# Patient Record
Sex: Male | Born: 1955 | Race: White | Hispanic: No | Marital: Married | Smoking: Never smoker
Health system: Southern US, Community
[De-identification: ages and names within clinical notes are randomized; demographics above are authoritative.]

## PROBLEM LIST (undated history)

## (undated) DIAGNOSIS — Z8701 Personal history of pneumonia (recurrent): Secondary | ICD-10-CM

## (undated) DIAGNOSIS — R931 Abnormal findings on diagnostic imaging of heart and coronary circulation: Secondary | ICD-10-CM

## (undated) DIAGNOSIS — J189 Pneumonia, unspecified organism: Secondary | ICD-10-CM

## (undated) DIAGNOSIS — E785 Hyperlipidemia, unspecified: Secondary | ICD-10-CM

## (undated) DIAGNOSIS — H9190 Unspecified hearing loss, unspecified ear: Secondary | ICD-10-CM

## (undated) DIAGNOSIS — Z9289 Personal history of other medical treatment: Secondary | ICD-10-CM

## (undated) DIAGNOSIS — R9439 Abnormal result of other cardiovascular function study: Secondary | ICD-10-CM

## (undated) DIAGNOSIS — I2510 Atherosclerotic heart disease of native coronary artery without angina pectoris: Principal | ICD-10-CM

## (undated) DIAGNOSIS — C801 Malignant (primary) neoplasm, unspecified: Secondary | ICD-10-CM

## (undated) DIAGNOSIS — I200 Unstable angina: Secondary | ICD-10-CM

## (undated) DIAGNOSIS — K219 Gastro-esophageal reflux disease without esophagitis: Secondary | ICD-10-CM

## (undated) DIAGNOSIS — I10 Essential (primary) hypertension: Secondary | ICD-10-CM

## (undated) DIAGNOSIS — C099 Malignant neoplasm of tonsil, unspecified: Secondary | ICD-10-CM

## (undated) DIAGNOSIS — M109 Gout, unspecified: Secondary | ICD-10-CM

## (undated) HISTORY — DX: Abnormal findings on diagnostic imaging of heart and coronary circulation: R93.1

## (undated) HISTORY — DX: Abnormal result of other cardiovascular function study: R94.39

## (undated) HISTORY — DX: Atherosclerotic heart disease of native coronary artery without angina pectoris: I25.10

## (undated) HISTORY — DX: Malignant (primary) neoplasm, unspecified: C80.1

## (undated) HISTORY — DX: Unstable angina: I20.0

## (undated) HISTORY — PX: WISDOM TOOTH EXTRACTION: SHX21

## (undated) HISTORY — DX: Personal history of other medical treatment: Z92.89

## (undated) HISTORY — PX: VASECTOMY: SHX75

## (undated) HISTORY — PX: CORONARY ARTERY BYPASS GRAFT: SHX141

## (undated) HISTORY — DX: Hyperlipidemia, unspecified: E78.5

## (undated) NOTE — *Deleted (*Deleted)
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  Pt with PMH also + for CAD s/p CABG x4 2017.  His treatment has been complicated by mucositis, oral candidiasis, hospital admission with n/v - dc'd 03/05/2020. He is s/p PEG and port placement 02/10/2020 and several dentition extraction with debridement 02/04/2020.  Pt has working with OP SLP and per notes, pt and daughter report pt has experienced coughing with intake x 4-6 weeks.  Reports more issues with swallowing foods than liquids.      Type of Study MBS-Modified Barium Swallow Study  Previous Swallow Assessment clinical eval with OP SlP  Diet Prior to this Study Dysphagia 3 (soft);Thin liquids  Respiratory Status Room air  History of Recent Intubation No  Behavior/Cognition Alert;Cooperative;Pleasant mood  Oral Cavity Assessment Edema;Erythema;Excessive secretions;Other (comment) (viscous secretions as well as suspected mucositis)  Oral Care Completed by SLP Yes (instructed pt to drink water to decrease secretion retention)  Oral Cavity - Dentition Adequate natural dentition;Other (Comment) (anterior dentition only)  Vision Functional for self feeding  Self-Feeding Abilities Able to feed self  Patient Positioning Upright in chair  Baseline Vocal Quality Hoarse;Low vocal intensity;Other (comment) (hypernasal)  Volitional Cough Strong  Volitional Swallow Able to elicit  Anatomy Other (Comment)  Pharyngeal Secretions Standing secretions in (comment) (oropharyngeal secretions mixed with barium)  Oral Motor/Sensory Function  Overall Oral Motor/Sensory Function Moderate impairment  Facial  Strength  (able to seal lips on straw and cup)  Lingual ROM Reduced right  Lingual Symmetry Abnormal symmetry right  Lingual Strength Reduced;Suspected CN XII (hypoglossal) dysfunction  Velum Impaired right (deviates left upon phonation)  Mandible Other (Comment) (decreased ROM noted - advise trismus device)  Oral Preparation/Oral Phase  Oral Phase Impaired  Oral - Nectar  Oral - Nectar Cup WFL;Premature spillage  Oral - Nectar Teaspoon WFL  Oral - Thin  Oral - Thin Straw WFL  Oral - Thin Cup WFL  Oral - Thin Teaspoon WFL  Oral - Solids  Oral - Pill WFL (pt swallowed whole without liquids initially)  Oral - Mech Soft Lingual/palatal residue (pt reports oral retention of cracker due dentition)  Oral - Puree WFL  Pharyngeal Phase  Pharyngeal Phase Impaired  Pharyngeal - Nectar  Pharyngeal- Nectar Cup Delayed swallow initiation-pyriform sinuses;Reduced epiglottic inversion;Reduced laryngeal elevation;Reduced airway/laryngeal closure;Reduced tongue base retraction;Penetration/Aspiration during swallow;Pharyngeal residue - valleculae;Pharyngeal residue - pyriform  Pharyngeal- Nectar Teaspoon Reduced epiglottic inversion;Reduced tongue base retraction;Pharyngeal residue - valleculae;Pharyngeal residue - pyriform  Pharyngeal Material enters airway, remains ABOVE vocal cords and not ejected out  Pharyngeal - Thin  Pharyngeal- Thin Straw Reduced epiglottic inversion;Reduced laryngeal elevation;Reduced airway/laryngeal closure;Reduced tongue base retraction;Penetration/Aspiration during swallow;Penetration/Apiration after swallow;Trace aspiration  Pharyngeal- Thin Cup Reduced epiglottic inversion;Reduced laryngeal elevation;Reduced airway/laryngeal closure;Reduced tongue base retraction;Penetration/Aspiration during swallow;Penetration/Apiration after swallow;Trace aspiration  Pharyngeal- Thin Teaspoon WFL  Pharyngeal Material enters airway, passes BELOW cords and not ejected out despite  cough attempt by patient  Pharyngeal Material enters airway, CONTACTS cords and then ejected out;Material enters airway, passes BELOW cords and not ejected out despite cough attempt by patient  Pharyngeal - Solids  Pharyngeal- Pill Reduced epiglottic inversion;Reduced laryngeal elevation;Reduced airway/laryngeal closure;Reduced  tongue base retraction;Pharyngeal residue - valleculae  Pharyngeal- Mechanical Soft Reduced laryngeal elevation;Reduced airway/laryngeal closure;Reduced tongue base retraction;Pharyngeal residue - valleculae  Pharyngeal- Puree WFL;Pharyngeal residue - valleculae  Pharyngeal Phase - Comment  Pharyngeal Comment head turn right did not decrease retention in pharynx, dry swallows and "hock" and expectorate effective to expel vallecular retention, consumption of liquids after solids did not consisently clear pharyngeal retention and pt did NOT sense barium tablet lodged at vallecular region  Cervical Esophageal Phase  Cervical Esophageal Phase [facility]  Clinical Impression  Clinical Impression Patient presents with minimal oral and moderately severe pharyngeal dysphagia due to his tonsillar cancer, surgical intervention, etc.  He primarily demonstrates decreased sustained laryngeal elevation and tongue base retraction resulting in vallecular retention with liquids, tablet and cracker and minimal penetration/aspiration of liquids.  Pt is mostly sensate to aspiration resulting in reflexive coughing *at times delayed- likely reflex not activated until bolus reached carina*.  Aspiration amount increased with sequential swallows of thin due to decreased sustained laryngeal closure.  In addition, oropharyngeal secretions mixed with barium - and trace aspiration of secretions noted.  Pudding bolus transited easily through oropharynx with only trace retention (approx. 90% cleared with initial swallow)- likely due to increased sensory input and thus muscular contraction.  Pt had difficulties  masticating cracker bolus due to only anterior dentition present and reported oral cracker retention throughout remainder of study.  Decreased propulsion of cracker bolus resulted in increased vallecular retention (approx. 50% of bolus cleared with initial swallow).  Pt was sensate to this and advised would consume liquids - however with liquid consumption - cracker bolus with barium did not transit into esophagus.  Head turn right with liquid did not decrease retention however dry swallows effective.  Pt also able to expectorate to clear vallecular retention during session and advises he conducts this at home.  Barium tablet swallowed without liquid and lodged at vallecular space without sensation and did not transit into esophagus with 2 thin liquid boluses.  Use of pudding and cue to "swallow fast and hard" picked up tablet and transited it into the esophagus.  Educated pt to results of testing and recommendations during MBS reviewing recommendation to drink water, follow solids with liquids, take medications with puree *start and follow with liquids for moisture*.  We also discussed pt's lack of sensation to barium tablet in vallecular region and thus indication to use caution - recommend pt expectorate at end of meal/po to assure pharynx is clear.  In addition, advise pt swish and expectorate after meals to assure oral clearance adequate.  Pt likely is having some low-grade chronic aspiration but with precautions including small single sips of liquids besides water, implementing compensations his nutrition and pna risk can be diminished significantly.  SLP had p  SLP Visit Diagnosis Dysphagia, oropharyngeal phase (R13.12)  Impact on safety and function Mild aspiration risk;Moderate aspiration risk;Risk for inadequate nutrition/hydration  Swallow Evaluation Recommendations  SLP Diet Recommendations Dysphagia 3 (Mech soft) solids;Thin liquid  Liquid Administration via Cup  Medication Administration Whole  meds with puree  Supervision Patient able to self feed  Compensations Slow rate;Small sips/bites;Multiple dry swallows after each bite/sip;Follow solids with liquid;Clear throat intermittently ("hock" and expectorate, start po with water)  Postural Changes Remain semi-upright after after feeds/meals (Comment);Seated upright at 90 degrees  Treatment Plan  Oral Care Recommendations Oral care before and after PO  Individuals Consulted  Consulted and Agree with Results and Recommendations Patient  SLP Time Calculation  SLP Start Time (ACUTE  ONLY) 0920  SLP Stop Time (ACUTE ONLY) 1004  SLP Time Calculation (min) (ACUTE ONLY) 44 min  SLP Evaluations  $ SLP Speech Visit 1 Visit  SLP Evaluations  $Outpatient MBS Swallow 1 Procedure  $Swallowing Treatment 1 Procedure  Rolena Infante, MS [facility] SLP Acute Rehab Services Office [phone]

---

## 2013-09-03 LAB — CBC AND DIFFERENTIAL
HCT: 44 % (ref 41–53)
Hemoglobin: 15 g/dL (ref 13.5–17.5)
Neutrophils Absolute: 3 /uL
Platelets: 158 10*3/uL (ref 150–399)
WBC: 5.1 10^3/mL

## 2013-09-03 LAB — BASIC METABOLIC PANEL
BUN: 10 mg/dL (ref 4–21)
Creatinine: 0.9 mg/dL (ref 0.6–1.3)
Glucose: 114 mg/dL
Potassium: 4.1 mmol/L (ref 3.4–5.3)
Sodium: 144 mmol/L (ref 137–147)

## 2013-09-03 LAB — HEPATIC FUNCTION PANEL
ALT: 38 U/L (ref 10–40)
AST: 31 U/L (ref 14–40)
Alkaline Phosphatase: 65 U/L (ref 25–125)
Bilirubin, Total: 0.8 mg/dL

## 2013-09-03 LAB — LIPID PANEL
Cholesterol: 235 mg/dL — AB (ref 0–200)
HDL: 41 mg/dL (ref 35–70)
LDL Cholesterol: 148 mg/dL
LDl/HDL Ratio: 5.7
Triglycerides: 231 mg/dL — AB (ref 40–160)

## 2013-09-03 LAB — TSH: TSH: 3.02 u[IU]/mL (ref 0.41–5.90)

## 2013-09-03 LAB — HEMOGLOBIN A1C: Hemoglobin A1C: 5.8

## 2015-06-08 ENCOUNTER — Other Ambulatory Visit: Payer: Self-pay | Admitting: Family Medicine

## 2015-08-04 ENCOUNTER — Other Ambulatory Visit: Payer: Self-pay | Admitting: Family Medicine

## 2015-08-24 ENCOUNTER — Other Ambulatory Visit: Payer: Self-pay | Admitting: Family Medicine

## 2015-08-27 ENCOUNTER — Encounter: Payer: Self-pay | Admitting: Family Medicine

## 2015-08-27 ENCOUNTER — Ambulatory Visit (INDEPENDENT_AMBULATORY_CARE_PROVIDER_SITE_OTHER): Payer: PRIVATE HEALTH INSURANCE | Admitting: Family Medicine

## 2015-08-27 VITALS — BP 118/74 | HR 87 | Temp 98.5°F | Resp 16 | Wt 219.0 lb

## 2015-08-27 DIAGNOSIS — Z8639 Personal history of other endocrine, nutritional and metabolic disease: Secondary | ICD-10-CM | POA: Diagnosis not present

## 2015-08-27 DIAGNOSIS — R0789 Other chest pain: Secondary | ICD-10-CM | POA: Diagnosis not present

## 2015-08-27 DIAGNOSIS — Z8739 Personal history of other diseases of the musculoskeletal system and connective tissue: Secondary | ICD-10-CM

## 2015-08-27 DIAGNOSIS — I10 Essential (primary) hypertension: Secondary | ICD-10-CM | POA: Diagnosis not present

## 2015-08-27 DIAGNOSIS — E785 Hyperlipidemia, unspecified: Secondary | ICD-10-CM

## 2015-08-27 DIAGNOSIS — J309 Allergic rhinitis, unspecified: Secondary | ICD-10-CM

## 2015-08-27 DIAGNOSIS — K219 Gastro-esophageal reflux disease without esophagitis: Secondary | ICD-10-CM | POA: Insufficient documentation

## 2015-08-27 DIAGNOSIS — Z87898 Personal history of other specified conditions: Secondary | ICD-10-CM | POA: Insufficient documentation

## 2015-08-27 NOTE — Progress Notes (Signed)
Patient ID: Omar Christian, male   DOB: Feb 23, 1956, 60 y.o.   MRN: [id number]       Patient: Omar Christian Male    DOB: 09/13/1955   60 y.o.   MRN: [id number] Visit Date: 08/27/2015  Today's Provider: Vernie Murders, PA   Chief Complaint  Patient presents with  . Chest Pain   Subjective:    HPI  Patient states that he has had issues with chest pain off and on for about 1 year. He notices it more with exertion and not at rest. He states he has been exercising for the past 3 weeks. This morning he went to exercise and started to feel "funny" so he went back home and took his b/p medication thinking it was related to him not taking it yet. When he gets chest pain it is usually located in the mid of his chest. This has worsened over a period of time.    No Known Allergies Previous Medications   ALLOPURINOL (ZYLOPRIM) 100 MG TABLET    TAKE 1 TABLET BY MOUTH EVERY DAY   FLUTICASONE (FLONASE) 50 MCG/ACT NASAL SPRAY    INSTILL 2 SPRAYS INTO EACH NOSTRIL AT BEDTIME   LISINOPRIL-HYDROCHLOROTHIAZIDE (PRINZIDE,ZESTORETIC) 10-12.5 MG TABLET    TAKE 1 TABLET BY MOUTH EVERY DAY   LORATADINE (CLARITIN) 10 MG TABLET    TAKE ONE TABLET BY MOUTH ONCE DAILY   PSYLLIUM (METAMUCIL PO)    Take by mouth.   RANITIDINE (ZANTAC) 150 MG CAPSULE    TAKE ONE CAPSULE BY MOUTH AT BEDTIME   RED YEAST RICE EXTRACT (RED YEAST RICE PO)    Take by mouth.    Review of Systems  Constitutional: Negative.   HENT: Positive for hearing loss and tinnitus.   Eyes: Negative.   Respiratory: Positive for chest tightness.   Cardiovascular: Positive for chest pain.  Gastrointestinal: Positive for abdominal distention.  Endocrine: Negative.   Musculoskeletal: Positive for arthralgias and gait problem.  Skin: Negative.   Allergic/Immunologic: Negative.   Neurological: Negative.   Hematological: Negative.   Psychiatric/Behavioral: Negative.     Social History  Substance Use Topics  . Smoking status: Never Smoker   .  Smokeless tobacco: Never Used  . Alcohol Use: No   Objective:   BP 118/74 mmHg  Pulse 87  Temp(Src) 98.5 F (36.9 C)  Resp 16  Wt 219 lb (99.338 kg)  SpO2 97%  Physical Exam  Constitutional: He appears well-developed and well-nourished.  HENT:  Head: Normocephalic.  Right Ear: External ear normal.  Left Ear: External ear normal.  Nose: Nose normal.  Mouth/Throat: Oropharynx is clear and moist.  Eyes: Conjunctivae and EOM are normal.  Neck: Normal range of motion. Neck supple. No JVD present.  Cardiovascular: Normal rate, regular rhythm, normal heart sounds and intact distal pulses.   Pulmonary/Chest: Effort normal and breath sounds normal.  Abdominal: Soft. Bowel sounds are normal.  Musculoskeletal: Normal range of motion.  Neurological: He is alert. He has normal reflexes.  Skin: No rash noted.  Psychiatric: He has a normal mood and affect.  Slightly anxious.      Assessment & Plan:     1. Other chest pain Having chest pain with exercise occasionally. Stops with rest and can go back to the workout. Denies dyspnea or diaphoresis (uncertain he fully understands questions. Mother died at age 24 from MI. EKG today showed sinus rhythm and slight diffuse ST changes. Very anxious about discomfort and heart issues similar to mother. Will get Troponin  level and schedule cardiology referral (may need stress test). - EKG 12-Lead - Ambulatory referral to Cardiology - Troponin I  2. Benign hypertension Very good BP reading today. Tolerating Prinzide without side effects. Continue present dosage and check labs. - CBC with Differential/Platelet  3. H/O: gout No recent flares. Tolerating Allopurinol 100 mg qd and trying to follow purine restricted diet. Recheck uric acid level. - Uric acid  4. HLD (hyperlipidemia) Continues to take Red Yeast Rice and Metamucil to try to lower cholesterol levels since the fall of 2016. Will recheck labs and continue low fat diet. - Psyllium  (METAMUCIL PO); Take by mouth. - Red Yeast Rice Extract (RED YEAST RICE PO); Take by mouth. - TSH - Lipid panel - Comprehensive metabolic panel  5. Allergic rhinitis, unspecified allergic rhinitis type Good control with use of Loratadine and Flonase.       Vernie Murders, PA  [facility]

## 2015-08-28 LAB — LIPID PANEL
Chol/HDL Ratio: 4.6 ratio units (ref 0.0–5.0)
Cholesterol, Total: 206 mg/dL — ABNORMAL HIGH (ref 100–199)
HDL: 45 mg/dL (ref >39)
LDL Calculated: 134 mg/dL — ABNORMAL HIGH (ref 0–99)
Triglycerides: 134 mg/dL (ref 0–149)
VLDL Cholesterol Cal: 27 mg/dL (ref 5–40)

## 2015-08-28 LAB — CBC WITH DIFFERENTIAL/PLATELET
Basophils Absolute: 0 10*3/uL (ref 0.0–0.2)
Basos: 0 %
EOS (ABSOLUTE): 0 10*3/uL (ref 0.0–0.4)
Eos: 1 %
Hematocrit: 41.7 % (ref 37.5–51.0)
Hemoglobin: 14.2 g/dL (ref 12.6–17.7)
Immature Grans (Abs): 0 10*3/uL (ref 0.0–0.1)
Immature Granulocytes: 0 %
Lymphocytes Absolute: 1.5 10*3/uL (ref 0.7–3.1)
Lymphs: 27 %
MCH: 30.1 pg (ref 26.6–33.0)
MCHC: 34.1 g/dL (ref 31.5–35.7)
MCV: 89 fL (ref 79–97)
Monocytes Absolute: 0.3 10*3/uL (ref 0.1–0.9)
Monocytes: 6 %
Neutrophils Absolute: 3.7 10*3/uL (ref 1.4–7.0)
Neutrophils: 66 %
Platelets: 155 10*3/uL (ref 150–379)
RBC: 4.71 x10E6/uL (ref 4.14–5.80)
RDW: 13 % (ref 12.3–15.4)
WBC: 5.6 10*3/uL (ref 3.4–10.8)

## 2015-08-28 LAB — COMPREHENSIVE METABOLIC PANEL
ALT: 36 IU/L (ref 0–44)
AST: 28 IU/L (ref 0–40)
Albumin/Globulin Ratio: 1.9 (ref 1.1–2.5)
Albumin: 4.5 g/dL (ref 3.5–5.5)
Alkaline Phosphatase: 64 IU/L (ref 39–117)
BUN/Creatinine Ratio: 9 (ref 9–20)
BUN: 8 mg/dL (ref 6–24)
Bilirubin Total: 0.8 mg/dL (ref 0.0–1.2)
CO2: 26 mmol/L (ref 18–29)
Calcium: 9.8 mg/dL (ref 8.7–10.2)
Chloride: 99 mmol/L (ref 96–106)
Creatinine, Ser: 0.85 mg/dL (ref 0.76–1.27)
GFR calc Af Amer: 111 mL/min/{1.73_m2} (ref >59)
GFR calc non Af Amer: 96 mL/min/{1.73_m2} (ref >59)
Globulin, Total: 2.4 g/dL (ref 1.5–4.5)
Glucose: 106 mg/dL — ABNORMAL HIGH (ref 65–99)
Potassium: 4 mmol/L (ref 3.5–5.2)
Sodium: 140 mmol/L (ref 134–144)
Total Protein: 6.9 g/dL (ref 6.0–8.5)

## 2015-08-28 LAB — TROPONIN I: Troponin I: 0.03 ng/mL (ref 0.00–0.04)

## 2015-08-28 LAB — TSH: TSH: 0.973 u[IU]/mL (ref 0.450–4.500)

## 2015-08-28 LAB — URIC ACID: Uric Acid: 7.3 mg/dL (ref 3.7–8.6)

## 2015-08-30 ENCOUNTER — Telehealth: Payer: Self-pay

## 2015-08-30 NOTE — Telephone Encounter (Signed)
LMTCB

## 2015-08-30 NOTE — Telephone Encounter (Signed)
-----   Message from [place], [place] sent at 08/30/2015 10:48 AM EST ----- Cholesterol better than 2 years ago and troponin test negative for heart attack. Uric acid level above 6.0 and should increase Allopurinol to 100 mg 2 daily. Continue present diet control and other medications at their present dosage. Proceed with referral to cardiologist for possible stress test. Recheck cholesterol blood tests in 6 months.

## 2015-08-31 ENCOUNTER — Telehealth: Payer: Self-pay | Admitting: Family Medicine

## 2015-08-31 NOTE — Telephone Encounter (Signed)
[place] wanted to let Simona Huh know that pt didn't show to his appt that we referred pt to yesterday 08/30/15. Thanks TNP

## 2015-08-31 NOTE — Telephone Encounter (Signed)
Please review. Thanks!  

## 2015-08-31 NOTE — Telephone Encounter (Signed)
Patient states he was unaware that he had a appointment with [address]. Judson Roch will reschedule patient's appointment and advise patient of new appointment date and time.

## 2015-08-31 NOTE — Telephone Encounter (Signed)
Advised patient of results.  

## 2015-09-03 DIAGNOSIS — R079 Chest pain, unspecified: Secondary | ICD-10-CM | POA: Insufficient documentation

## 2015-09-24 DIAGNOSIS — Z9289 Personal history of other medical treatment: Secondary | ICD-10-CM

## 2015-09-24 DIAGNOSIS — R9439 Abnormal result of other cardiovascular function study: Secondary | ICD-10-CM

## 2015-09-24 HISTORY — DX: Abnormal result of other cardiovascular function study: R94.39

## 2015-09-24 HISTORY — DX: Personal history of other medical treatment: Z92.89

## 2015-09-30 ENCOUNTER — Encounter: Payer: Self-pay | Admitting: *Deleted

## 2015-09-30 ENCOUNTER — Encounter
Admission: RE | Disposition: A | Discharge status: Home / Self Care | Payer: Self-pay | Source: Ambulatory Visit | Attending: Cardiology

## 2015-09-30 ENCOUNTER — Ambulatory Visit
Admission: RE | Admit: 2015-09-30 | Discharge: 2015-09-30 | Disposition: A | Discharge status: Home / Self Care | Payer: PRIVATE HEALTH INSURANCE | Source: Ambulatory Visit | Attending: Cardiology | Admitting: Cardiology

## 2015-09-30 DIAGNOSIS — E7800 Pure hypercholesterolemia, unspecified: Secondary | ICD-10-CM | POA: Diagnosis not present

## 2015-09-30 DIAGNOSIS — I10 Essential (primary) hypertension: Secondary | ICD-10-CM | POA: Insufficient documentation

## 2015-09-30 DIAGNOSIS — I2510 Atherosclerotic heart disease of native coronary artery without angina pectoris: Secondary | ICD-10-CM

## 2015-09-30 DIAGNOSIS — R079 Chest pain, unspecified: Secondary | ICD-10-CM | POA: Diagnosis present

## 2015-09-30 DIAGNOSIS — Z79899 Other long term (current) drug therapy: Secondary | ICD-10-CM | POA: Insufficient documentation

## 2015-09-30 DIAGNOSIS — Z8249 Family history of ischemic heart disease and other diseases of the circulatory system: Secondary | ICD-10-CM | POA: Insufficient documentation

## 2015-09-30 DIAGNOSIS — R931 Abnormal findings on diagnostic imaging of heart and coronary circulation: Secondary | ICD-10-CM

## 2015-09-30 DIAGNOSIS — E785 Hyperlipidemia, unspecified: Secondary | ICD-10-CM | POA: Insufficient documentation

## 2015-09-30 HISTORY — DX: Atherosclerotic heart disease of native coronary artery without angina pectoris: I25.10

## 2015-09-30 HISTORY — DX: Abnormal findings on diagnostic imaging of heart and coronary circulation: R93.1

## 2015-09-30 HISTORY — DX: Gastro-esophageal reflux disease without esophagitis: K21.9

## 2015-09-30 HISTORY — DX: Essential (primary) hypertension: I10

## 2015-09-30 HISTORY — PX: CARDIAC CATHETERIZATION: SHX172

## 2015-09-30 SURGERY — LEFT HEART CATH AND CORONARY ANGIOGRAPHY
Anesthesia: Moderate Sedation

## 2015-09-30 MED ORDER — SODIUM CHLORIDE 0.9 % IV SOLN: 250.0000 mL | INTRAVENOUS | Status: DC | PRN

## 2015-09-30 MED ORDER — HEPARIN (PORCINE) IN NACL 2-0.9 UNIT/ML-% IJ SOLN
INTRAMUSCULAR | Status: AC
  Filled 2015-09-30: qty 1000

## 2015-09-30 MED ORDER — FENTANYL CITRATE (PF) 100 MCG/2ML IJ SOLN
INTRAMUSCULAR | Status: DC | PRN
  Administered 2015-09-30: 25 ug via INTRAVENOUS

## 2015-09-30 MED ORDER — SODIUM CHLORIDE 0.9% FLUSH
3.0000 mL | INTRAVENOUS | Status: DC | PRN
  Administered 2015-09-30: 10 mL via INTRAVENOUS
  Filled 2015-09-30: qty 3

## 2015-09-30 MED ORDER — MIDAZOLAM HCL 2 MG/2ML IJ SOLN
INTRAMUSCULAR | Status: DC | PRN
Start: 2015-09-30 — End: 2015-09-30
  Administered 2015-09-30: 1 mg via INTRAVENOUS

## 2015-09-30 MED ORDER — SODIUM CHLORIDE 0.9 % WEIGHT BASED INFUSION: 1.0000 mL/kg/h | INTRAVENOUS | Status: DC

## 2015-09-30 MED ORDER — IOPAMIDOL (ISOVUE-300) INJECTION 61%
INTRAVENOUS | Status: DC | PRN
  Administered 2015-09-30: 150 mL via INTRA_ARTERIAL

## 2015-09-30 MED ORDER — CLOPIDOGREL BISULFATE 75 MG PO TABS
ORAL_TABLET | ORAL | Status: AC
  Filled 2015-09-30: qty 8

## 2015-09-30 MED ORDER — FENTANYL CITRATE (PF) 100 MCG/2ML IJ SOLN
INTRAMUSCULAR | Status: AC
  Filled 2015-09-30: qty 2

## 2015-09-30 MED ORDER — BIVALIRUDIN 250 MG IV SOLR
INTRAVENOUS | Status: AC
  Filled 2015-09-30: qty 250

## 2015-09-30 MED ORDER — SODIUM CHLORIDE 0.9 % WEIGHT BASED INFUSION
3.0000 mL/kg/h | INTRAVENOUS | Status: DC
  Administered 2015-09-30: 3 mL/kg/h via INTRAVENOUS

## 2015-09-30 MED ORDER — ASPIRIN 81 MG PO CHEW: 81.0000 mg | CHEWABLE_TABLET | ORAL | Status: DC

## 2015-09-30 MED ORDER — SODIUM CHLORIDE 0.9% FLUSH: 3.0000 mL | Freq: Two times a day (BID) | INTRAVENOUS | Status: DC

## 2015-09-30 MED ORDER — NITROGLYCERIN 5 MG/ML IV SOLN
INTRAVENOUS | Status: AC
  Filled 2015-09-30: qty 10

## 2015-09-30 MED ORDER — MIDAZOLAM HCL 2 MG/2ML IJ SOLN
INTRAMUSCULAR | Status: AC
  Filled 2015-09-30: qty 2

## 2015-09-30 SURGICAL SUPPLY — 11 items
CATH INFINITI 5 FR 3DRC (CATHETERS) ×2 IMPLANT
CATH INFINITI 5FR ANG PIGTAIL (CATHETERS) ×2 IMPLANT
CATH INFINITI 5FR JL4 (CATHETERS) ×2 IMPLANT
CATH INFINITI 5FR JL5 (CATHETERS) ×2 IMPLANT
CATH INFINITI JR4 5F (CATHETERS) ×2 IMPLANT
DEVICE CLOSURE MYNXGRIP 5F (Vascular Products) ×2 IMPLANT
KIT MANI 3VAL PERCEP (MISCELLANEOUS) ×2 IMPLANT
NEEDLE PERC 18GX7CM (NEEDLE) ×2 IMPLANT
PACK CARDIAC CATH (CUSTOM PROCEDURE TRAY) ×2 IMPLANT
SHEATH AVANTI 5FR X 11CM (SHEATH) ×2 IMPLANT
WIRE EMERALD 3MM-J .035X150CM (WIRE) ×2 IMPLANT

## 2015-09-30 NOTE — Discharge Instructions (Signed)

## 2015-10-01 ENCOUNTER — Institutional Professional Consult (permissible substitution) (INDEPENDENT_AMBULATORY_CARE_PROVIDER_SITE_OTHER): Payer: PRIVATE HEALTH INSURANCE | Admitting: Cardiothoracic Surgery

## 2015-10-01 ENCOUNTER — Other Ambulatory Visit: Payer: Self-pay | Admitting: *Deleted

## 2015-10-01 ENCOUNTER — Encounter: Payer: Self-pay | Admitting: *Deleted

## 2015-10-01 ENCOUNTER — Encounter: Payer: Self-pay | Admitting: Cardiothoracic Surgery

## 2015-10-01 VITALS — BP 125/76 | HR 88 | Resp 16 | Ht 73.0 in | Wt 216.0 lb

## 2015-10-01 DIAGNOSIS — I2510 Atherosclerotic heart disease of native coronary artery without angina pectoris: Secondary | ICD-10-CM | POA: Diagnosis not present

## 2015-10-01 DIAGNOSIS — I200 Unstable angina: Secondary | ICD-10-CM | POA: Diagnosis not present

## 2015-10-01 DIAGNOSIS — E7800 Pure hypercholesterolemia, unspecified: Secondary | ICD-10-CM

## 2015-10-01 MED ORDER — ROSUVASTATIN CALCIUM 5 MG PO TABS: 10.0000 mg | ORAL_TABLET | Freq: Every day | ORAL | Status: DC

## 2015-10-01 MED ORDER — METOPROLOL TARTRATE 25 MG PO TABS: 12.5000 mg | ORAL_TABLET | Freq: Two times a day (BID) | ORAL | Status: DC

## 2015-10-01 MED ORDER — ASPIRIN EC 81 MG PO TBEC: 81.0000 mg | DELAYED_RELEASE_TABLET | Freq: Every day | ORAL | Status: DC

## 2015-10-01 MED ORDER — NITROGLYCERIN 0.4 MG SL SUBL: 0.4000 mg | SUBLINGUAL_TABLET | SUBLINGUAL | Status: DC | PRN

## 2015-10-01 MED ORDER — ATORVASTATIN CALCIUM 20 MG PO TABS: 20.0000 mg | ORAL_TABLET | ORAL | Status: DC

## 2015-10-01 NOTE — Patient Instructions (Signed)
Stop lisinopril Wednesday April 5    Angina Pectoris Angina pectoris, often called angina, is extreme discomfort in the chest, neck, or arm. This is caused by a lack of blood in the middle and thickest layer of the heart wall (myocardium). There are four types of angina:  Stable angina. Stable angina usually occurs in episodes of predictable frequency and duration. It is usually brought on by physical activity, stress, or excitement. Stable angina usually lasts a few minutes and can often be relieved by a medicine that you place under your tongue. This medicine is called sublingual nitroglycerin.  Unstable angina. Unstable angina can occur even when you are doing little or no physical activity. It can even occur while you are sleeping or when you are at rest. It can suddenly increase in severity or frequency. It may not be relieved by sublingual nitroglycerin, and it can last up to 30 minutes.  Microvascular angina. This type of angina is caused by a disorder of tiny blood vessels called arterioles. Microvascular angina is more common in women. The pain may be more severe and last longer than other types of angina pectoris.  Prinzmetal or variant angina. This type of angina pectoris is rare and usually occurs when you are doing little or no physical activity. It especially occurs in the early morning hours. CAUSES Atherosclerosis is the cause of angina. This is the buildup of fat and cholesterol (plaque) on the inside of the arteries. Over time, the plaque may narrow or block the artery, and this will lessen blood flow to the heart. Plaque can also become weak and break off within a coronary artery to form a clot and cause a sudden blockage. RISK FACTORS Risk factors common to both men and women include:  High cholesterol levels.  High blood pressure (hypertension).  Tobacco use.  Diabetes.  Family history of angina.  Obesity.  Lack of exercise.  A diet high in saturated fats. Women  are at greater risk for angina if they are:  Over age 53.  Postmenopausal. SYMPTOMS Many people do not experience any symptoms during the early stages of angina. As the condition progresses, symptoms common to both men and women may include:  Chest pain.  The pain can be described as a crushing or squeezing in the chest, or a tightness, pressure, fullness, or heaviness in the chest.  The pain can last more than a few minutes, or it can stop and recur.  Pain in the arms, neck, jaw, or back.  Unexplained heartburn or indigestion.  Shortness of breath.  Nausea.  Sudden cold sweats.  Sudden light-headedness. Many women have chest discomfort and some of the other symptoms. However, women often have different (atypical) symptoms, such as:   Fatigue.  Unexplained feelings of nervousness or anxiety.  Unexplained weakness.  Dizziness or fainting. Sometimes, women may have angina without any symptoms. DIAGNOSIS  Tests to diagnose angina may include:  ECG (electrocardiogram).  Exercise stress test. This looks for signs of blockage when the heart is being exercised.  Pharmacologic stress test. This test looks for signs of blockage when the heart is being stressed with a medicine.  Blood tests.  Coronary angiogram. This is a procedure to look at the coronary arteries to see if there is any blockage. TREATMENT  The treatment of angina may include the following:  Healthy behavioral changes to reduce or control risk factors.  Medicine.  Coronary stenting.A stent helps to keep an artery open.  Coronary angioplasty. This procedure widens  a narrowed or blocked artery.  Coronary arterybypass surgery. This will allow your blood to pass the blockage (bypass) to reach your heart. HOME CARE INSTRUCTIONS   Take medicines only as directed by your health care provider.  Do not take the following medicines unless your health care provider approves:  Nonsteroidal  anti-inflammatory drugs (NSAIDs), such as ibuprofen, naproxen, or celecoxib.  Vitamin supplements that contain vitamin A, vitamin E, or both.  Hormone replacement therapy that contains estrogen with or without progestin.  Manage other health conditions such as hypertension and diabetes as directed by your health care provider.  Follow a heart-healthy diet. A dietitian can help to educate you about healthy food options and changes.  Use healthy cooking methods such as roasting, grilling, broiling, baking, poaching, steaming, or stir-frying. Talk to a dietitian to learn more about healthy cooking methods.  Follow an exercise program approved by your health care provider.  Maintain a healthy weight. Lose weight as approved by your health care provider.  Plan rest periods when fatigued.  Learn to manage stress.  Do not use any tobacco products, including cigarettes, chewing tobacco, or electronic cigarettes. If you need help quitting, ask your health care provider.  If you drink alcohol, and your health care provider approves, limit your alcohol intake to no more than 1 drink per day. One drink equals 12 ounces of beer, 5 ounces of wine, or 1 ounces of hard liquor.  Stop illegal drug use.  Keep all follow-up visits as directed by your health care provider. This is important. SEEK IMMEDIATE MEDICAL CARE IF:   You have pain in your chest, neck, arm, jaw, stomach, or back that lasts more than a few minutes, is recurring, or is unrelieved by taking sublingualnitroglycerin.  You have profuse sweating without cause.  You have unexplained:  Heartburn or indigestion.  Shortness of breath or difficulty breathing.  Nausea or vomiting.  Fatigue.  Feelings of nervousness or anxiety.  Weakness.  Diarrhea.  You have sudden light-headedness or dizziness.  You faint. These symptoms may represent a serious problem that is an emergency. Do not wait to see if the symptoms will go away.  Get medical help right away. Call your local emergency services (911 in the [place] Do not drive yourself to the hospital.   This information is not intended to replace advice given to you by your health care provider. Make sure you discuss any questions you have with your health care provider.   Document Released: 06/19/2005 Document Revised: 07/10/2014 Document Reviewed: 10/21/2013 Elsevier Interactive Patient Education 2016 [employer] Coronary Artery Bypass Grafting Coronary artery bypass grafting (CABG) is a procedure done to bypass or fix arteries of the heart (coronary arteries) that have become narrow or blocked. This narrowing is usually the result of plaque that has built up in the walls of the vessels. The coronary arteries supply the heart with the oxygen and nutrients it needs to pump blood to your body. In the CABG procedure, a section of blood vessel from another part of the body (usually the leg, arm, or chest wall) is removed and then inserted where it will allow blood to bypass the damaged part of the coronary artery.  LET [facility] CARE PROVIDER KNOW ABOUT:  Any allergies you have.   All medicines you are taking, including blood thinners, vitamins, herbs, eye drops, creams, and over-the-counter medicines.   Use of steroids (by mouth or creams).   Previous problems you or members of your family have had  with the use of anesthetics.   Any blood disorders you have.   Previous surgeries you have had.   Medical conditions you have.  RISKS AND COMPLICATIONS Generally, this is a safe procedure. However, problems can occur and include:   Blood loss.   Stroke.   Infection.   Pain at the surgical site.   Heart attack during or after surgery.   Kidney failure.  BEFORE THE PROCEDURE  Take medicines only as directed by your health care provider. You may be asked to start new medicines and stop taking others. Do not stop medicines or adjust dosages on your  own.  Do not eat or drink anything after midnight the night before the procedure or as directed by your health care provider. Ask your health care provider if it is okay to take a sip of water with any needed medicines. PROCEDURE The surgeon may use either an open technique or a minimally invasive technique for this surgery. Traditional open surgery  You will be given medicine to make you sleep through the procedure (general anesthetic).  Once you are asleep, a cut (incision) will be made down the front of the chest through the breastbone (sternum). The sternum will be spread open so your heart can be seen.  You will then be placed on a heart-lung bypass machine. This machine will provide oxygen to your blood while the heart is undergoing surgery.  Your heart will then be temporarily stopped so that the surgeon can do the next steps.  A section of vein will likely be taken from your leg and used to bypass the blocked arteries of your heart. Sometimes parts of an artery from inside your chest wall or from your arm will be used, either alone or in combination with leg veins.  When the bypasses are done, you will be taken off the machine.  Your heart will be restarted and will take over again normally.  The sac around the heart will be closed.  Your chest will then be closed with stitches or staples.  Tubes will remain in your chest and will be connected to a suction device in order to help drain fluid and reinflate the lungs. Minimally invasive surgery This technique is done from an incision over your left chest area. If appropriate, your surgeon may not have to slow or stop your heart. If your condition allows for this procedure, there will often be less blood loss, less pain, a shorter hospital stay, and faster recovery compared to traditional open surgery. AFTER THE PROCEDURE  You will be taken to a recovery area to be monitored.  You may wake up with a tube in your throat to help  your breathing. You may be connected to a breathing machine. You will not be able to talk while the tube is in place. The tube will be taken out as soon as it is safe to do so.  You will be groggy and may have some pain. You will be given pain medicine to help control the pain.   This information is not intended to replace advice given to you by your health care provider. Make sure you discuss any questions you have with your health care provider.   Document Released: 03/29/2005 Document Revised: 07/10/2014 Document Reviewed: 11/26/2012 Elsevier Interactive Patient Education 2016 [employer]  Coronary Artery Bypass Grafting, Care After Refer to this sheet in the next few weeks. These instructions provide you with information on caring for yourself after your procedure. Your health  care provider may also give you more specific instructions. Your treatment has been planned according to current medical practices, but problems sometimes occur. Call your health care provider if you have any problems or questions after your procedure. WHAT TO EXPECT AFTER THE PROCEDURE Recovery from surgery will be different for everyone. Some people feel well after 3 or 4 weeks, while for others it takes longer. After your procedure, it is typical to have the following:  Nausea and a lack of appetite.   Constipation.  Weakness and fatigue.   Depression or irritability.   Pain or discomfort at your incision site. HOME CARE INSTRUCTIONS  Take medicines only as directed by your health care provider. Do not stop taking medicines or start any new medicines without first checking with your health care provider.  Take your pulse as directed by your health care provider.  Perform deep breathing as directed by your health care provider. If you were given a device called an incentive spirometer, use it to practice deep breathing several times a day. Support your chest with a pillow or your arms when you take deep  breaths or cough.  Keep incision areas clean, dry, and protected. Remove or change any bandages (dressings) only as directed by your health care provider. You may have skin adhesive strips over the incision areas. Do not take the strips off. They will fall off on their own.  Check incision areas daily for any swelling, redness, or drainage.  If incisions were made in your legs, do the following:  Avoid crossing your legs.   Avoid sitting for long periods of time. Change positions every 30 minutes.   Elevate your legs when you are sitting.  Wear compression stockings as directed by your health care provider. These stockings help keep blood clots from forming in your legs.  Take showers once your health care provider approves. Until then, only take sponge baths. Pat incisions dry. Do not rub incisions with a washcloth or towel. Do not take baths, swim, or use a hot tub until your health care provider approves.  Eat foods that are high in fiber, such as raw fruits and vegetables, whole grains, beans, and nuts. Meats should be lean cut. Avoid canned, processed, and fried foods.  Drink enough fluid to keep your urine clear or pale yellow.  Weigh yourself every day. This helps identify if you are retaining fluid that may make your heart and lungs work harder.  Rest and limit activity as directed by your health care provider. You may be instructed to:  Stop any activity at once if you have chest pain, shortness of breath, irregular heartbeats, or dizziness. Get help right away if you have any of these symptoms.  Move around frequently for short periods or take short walks as directed by your health care provider. Increase your activities gradually. You may need physical therapy or cardiac rehabilitation to help strengthen your muscles and build your endurance.  Avoid lifting, pushing, or pulling anything heavier than 10 lb (4.5 kg) for at least 6 weeks after surgery.  Do not drive until  your health care provider approves.  Ask your health care provider when you may return to work.  Ask your health care provider when you may resume sexual activity.  Keep all follow-up visits as directed by your health care provider. This is important. SEEK MEDICAL CARE IF:  You have swelling, redness, increasing pain, or drainage at the site of an incision.  You have a fever.  You have swelling in your ankles or legs.  You have pain in your legs.   You gain 2 or more pounds (0.9 kg) a day.  You are nauseous or vomit.  You have diarrhea. SEEK IMMEDIATE MEDICAL CARE IF:  You have chest pain that goes to your jaw or arms.  You have shortness of breath.   You have a fast or irregular heartbeat.   You notice a "clicking" in your breastbone (sternum) when you move.   You have numbness or weakness in your arms or legs.  You feel dizzy or light-headed.  MAKE SURE YOU:  Understand these instructions.  Will watch your condition.  Will get help right away if you are not doing well or get worse.   This information is not intended to replace advice given to you by your health care provider. Make sure you discuss any questions you have with your health care provider.   Document Released: 01/06/2005 Document Revised: 07/10/2014 Document Reviewed: 11/26/2012 Elsevier Interactive Patient Education [employer].

## 2015-10-01 NOTE — Progress Notes (Signed)
[address]       [place] [address]             [phone]                    Takeru Garrod Maeser Medical Record [id number] Date of Birth: 09/25/1955  Referring: [name], MD Primary Care: Vernie Murders, PA  Chief Complaint:    Chief Complaint  Patient presents with  . Coronary Artery Disease    eval for surgery.Marland KitchenMarland KitchenSTRESS TEST/ECHO 09/24/15 @ [place], CATH 09/30/15 @ [place]     History of Present Illness:    Omar Christian 60 y.o. male is seen in the office  today As an elective referral from Dr. Saralyn Pilar for coronary artery disease. Patient has had at least 6 months if not longer history of chest discomfort, indigestion sometimes with exertion and sometimes at rest. He notes that walking uphill or upstairs makes the symptoms worse and take several minutes of rest before they resolve. The patient has had no previous cardiac history. A nuclear stress test was performed in [place] on March 27 with EKG evidence of ischemia with exertion ejection fraction 44% apical wall hypokinesis moderate inferior and apical wall ischemia. Echocardiogram was performed at [facility] clinic on March 24 demonstrating dilated aortic root dilated ascending aorta to 4.5 cm ejection fraction 55% mild mitral and aortic insufficiency. Cardiac catheterization was performed at [facility] on 09/30/2015. The right coronary artery was not identified, but appears to have a left dominant system so likely the right coronary is small,  patient has severe disease in the left system including proximal LAD first obtuse marginal and circumflex, there is evidence of a ascending aortic dilatation, but this is not fully evaluated.  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 Not known to be diabetic   Current Activity/ Functional Status:  Patient is independent with mobility/ambulation, transfers, ADL's, IADL's.   Zubrod  Score: At the time of surgery this patient's most appropriate activity status/level should be described as: [x]     0    Normal activity, no symptoms []     1    Restricted in physical strenuous activity but ambulatory, able to do out light work []     2    Ambulatory and capable of self care, unable to do work activities, up and about               >50 % of waking hours                              []     3    Only limited self care, in bed greater than 50% of waking hours []     4    Completely disabled, no self care, confined to bed or chair []     5    Moribund   Past Medical History  Diagnosis Date  . Hypertension   . GERD (gastroesophageal reflux disease)   . Coronary artery disease   . Hyperlipidemia   . H/O echocardiogram 09/24/15    [place]  . Abnormal myocardial perfusion study 09/24/15    [facility]  . Abnormal findings on cardiac catheterization 09/30/15    [facility]  . Progressive angina ([place])   . CAD, multiple vessel 09/30/15    per CATH @ [facility]/DR.PARACHOS    No past surgical  history on file.  Family History  Problem Relation Age of Onset  . Heart disease Mother   . Alcohol abuse Father   . Throat cancer Father   . Muscular dystrophy Brother   . Muscular dystrophy Brother   . Hypertension Mother   Patient's mother died at age 65 of acute myocardial infarction father died at age 38 with carcinoma the throat, 2 brothers died of muscular dystrophy he has 2 living sisters 1 who is had coronary stents the other with severe curvature of the spine and respiratory difficulty on home oxygen, he has 2 daughters 1 with kidney problems and one with lupus  Social History   Social History  . Marital Status: Married    Spouse Name: N/A  . Number of Children: N/A  . Years of Education: N/A   Occupational History  . 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 Smoking status: Never Smoker   . Smokeless tobacco:  Never Used  . Alcohol Use: No  . Drug Use: No      History  Smoking status  . Never Smoker   Smokeless tobacco  . Never Used    History  Alcohol Use No     No Known Allergies  Current Outpatient Prescriptions  Medication Sig Dispense Refill  . allopurinol (ZYLOPRIM) 100 MG tablet TAKE 1 TABLET BY MOUTH EVERY DAY 30 tablet 3  . fluticasone (FLONASE) 50 MCG/ACT nasal spray INSTILL 2 SPRAYS INTO EACH NOSTRIL AT BEDTIME  3  . lisinopril-hydrochlorothiazide (PRINZIDE,ZESTORETIC) 10-12.5 MG tablet TAKE 1 TABLET BY MOUTH EVERY DAY 30 tablet 3  . loratadine (CLARITIN) 10 MG tablet TAKE ONE TABLET BY MOUTH ONCE DAILY (Patient taking differently: TAKE ONE TABLET BY MOUTH ONCE DAILY PRN) 30 tablet 3  . Psyllium (METAMUCIL PO) Take by mouth.    . ranitidine (ZANTAC) 150 MG capsule TAKE ONE CAPSULE BY MOUTH AT BEDTIME 30 capsule 3  . Red Yeast Rice Extract (RED YEAST RICE PO) Take by mouth.     No current facility-administered medications for this visit.      Review of Systems:     Cardiac Review of Systems: Y or N  Chest Pain [  y  ]  Resting SOB [ n  ] Exertional SOB  [ n ]  Orthopnea [n  ]   Pedal Edema [ n  ]    Palpitations [ n ] Syncope  [n  ]   Presyncope [ n ]  General Review of Systems: [Y] = yes [  ]=no Constitional: recent weight change [n ];  Wt loss over the last 3 months [   ] anorexia [  ]; fatigue [ n ]; nausea [ n ]; night sweats [n  ]; fever [  ]; or chills [  ];          Dental: poor dentition[  ]; Last Dentist visit:   Eye : blurred vision [n  ]; diplopia [   ]; vision changes [  ];  Amaurosis fugax[  ]; Resp: cough [  ];  wheezing[  ];  hemoptysis[n  ]; shortness of breath[ n ]; paroxysmal nocturnal dyspnea[n  ]; dyspnea on exertion[  ]; or orthopnea[  ];  GI:  gallstones[  ], vomiting[  ];  dysphagia[  ]; melena[  ];  hematochezia [  ]; heartburn[ y ];   Hx of  Colonoscopy[  ]; GU: kidney stones [  ];  hematuria[  ];   dysuria [  ];  nocturia[  ];  history of      obstruction [  ]; urinary frequency [ n ]             Skin: rash, swelling[  ];, hair loss[  ];  peripheral edema[  n];  or itching[  ]; Musculosketetal: myalgias[  ];  joint swelling[ n ];  joint erythema[  ];  joint pain[  ];  back pain[  ];  Heme/Lymph: bruising[  ];  bleeding[  ];  anemia[  ];  Neuro: TIA[ n ];  headaches[  ];  stroke[ n ];  vertigo[  ];  seizures[  ];   paresthesias[  ];  difficulty walking[  ];  Psych:depression[n  ]; anxiety[ n ];  Endocrine: diabetes[ n ];  thyroid dysfunction[n  ];  Immunizations: Flu up to date [?  ]; Pneumococcal up to date [?  ];  Other:  Physical Exam: BP 125/76 mmHg  Pulse 88  Resp 16  Ht 6\' 1"  (1.854 m)  Wt 216 lb (97.977 kg)  BMI 28.50 kg/m2  SpO2 97%  PHYSICAL EXAMINATION: General appearance: alert, cooperative and no distress Head: Normocephalic, without obvious abnormality, atraumatic Neck: no adenopathy, no carotid bruit, no JVD, supple, symmetrical, trachea midline and thyroid not enlarged, symmetric, no tenderness/mass/nodules Lymph nodes: Cervical, supraclavicular, and axillary nodes normal. Resp: clear to auscultation bilaterally Back: symmetric, no curvature. ROM normal. No CVA tenderness. Cardio: regular rate and rhythm, S1, S2 normal, no murmur, click, rub or gallop GI: soft, non-tender; bowel sounds normal; no masses,  no organomegaly Extremities: extremities normal, atraumatic, no cyanosis or edema and Homans sign is negative, no sign of DVT Neurologic: Grossly normal Appears to have adequate vein enlarged remedies for grafting  Diagnostic Studies & Laboratory data:     Recent Radiology Findings:   No results found.     Recent Lab Findings: Lab Results  Component Value Date   WBC 5.6 08/27/2015   HGB 15.0 09/03/2013   HCT 41.7 08/27/2015   PLT 155 08/27/2015   GLUCOSE 106* 08/27/2015   CHOL 206* 08/27/2015   TRIG 134 08/27/2015   HDL 45 08/27/2015   LDLCALC 134* 08/27/2015   ALT 36 08/27/2015   AST 28  08/27/2015   NA 140 08/27/2015   K 4.0 08/27/2015   CL 99 08/27/2015   CREATININE 0.85 08/27/2015   BUN 8 08/27/2015   CO2 26 08/27/2015   TSH 0.973 08/27/2015   HGBA1C 5.8 09/03/2013   ECHO: done [place]: INTERPRETATION  NORMAL LEFT VENTRICULAR SYSTOLIC FUNCTION WITH AN ESTIMATED EF = 55 %  NORMAL RIGHT VENTRICULAR SYSTOLIC FUNCTION  MILD MITRAL AND AORTIC VALVE INSUFFICIENCY  TRACE TRICUSPID VALVE INSUFFICIENCY  NO VALVULAR STENOSIS  MILDLY DILATED AORTIC ROOT  MILDLY DILATED ASCENDING AORTA  4.5 cm MILD LVH   CATH:  [name], MD (Primary)      Procedures    Left Heart Cath and Coronary Angiography    Conclusion     Ost LAD to Prox LAD lesion, 95% stenosed.  Prox Cx lesion, 95% stenosed.  Ost 1st Mrg to 1st Mrg lesion, 80% stenosed.  Mid Cx lesion, 95% stenosed.  Mid LAD to Dist LAD lesion, 50% stenosed.  Dist LAD lesion, 50% stenosed.  1. Severe three-vessel coronary artery disease with high-grade 95% stenosis proximal LAD, 80% proximal large dominant left circumflex, 80% OM1, 95% mid left circumflex. 2. Preserved left ventricular function with anteroapical hypokinesis  I have independently reviewed the above  cath films and reviewed the findings with the  patient .- Ascending aorta appears dilated, no aortic insufficiency noted, right coronary artery is not identified, likely small with distal circumflex supplying the posterior descending.  Assessment / Plan:   Patient is a 60 year old male not known to be diabetic, with severe LAD and circumflex disease and positive stress test. The distal LAD is a small poor quality vessel suggestive of diabetic patient. I reviewed the films and discussed with the patient his wife and daughter and agree with recommendation for coronary artery bypass grafting, next week. Prior to surgery we will obtain a CTA of the chest to accurately measure the ascending aorta. The patient comes in not on  beta blocker , aspirin or statin. He will be started on Lopressor 12.5 mg by mouth twice a day, statin, 81 mg of aspirin a day. He's also been given prescription for nitroglycerin and instructed how to use nitroglycerin and should he have recurrent chest pain to call 911 immediately. He was cautioned not to return to work until cleared after surgery, and not to engage in any heavy physical activity prior to surgery.  Risks and options have been discussed with the patient his wife and daughter in detail and their questions have been answered.  I  spent 40 minutes counseling the patient face to face and 50% or more the  time was spent in counseling and coordination of care. The total time spent in the appointment was 60 minutes.  [name] MD      [address] [place] [address] Office [phone]   Beeper [phone]  10/01/2015 1:46 PM

## 2015-10-03 ENCOUNTER — Encounter: Payer: Self-pay | Admitting: Cardiology

## 2015-10-04 DIAGNOSIS — I200 Unstable angina: Secondary | ICD-10-CM

## 2015-10-04 HISTORY — DX: Unstable angina: I20.0

## 2015-10-06 ENCOUNTER — Encounter (HOSPITAL_COMMUNITY): Payer: Self-pay

## 2015-10-06 ENCOUNTER — Ambulatory Visit (HOSPITAL_COMMUNITY)
Admission: RE | Admit: 2015-10-06 | Discharge: 2015-10-06 | Disposition: A | Discharge status: Home / Self Care | Payer: PRIVATE HEALTH INSURANCE | Source: Ambulatory Visit | Attending: Cardiothoracic Surgery | Admitting: Cardiothoracic Surgery

## 2015-10-06 ENCOUNTER — Encounter (HOSPITAL_COMMUNITY)
Admission: RE | Admit: 2015-10-06 | Discharge: 2015-10-06 | Disposition: A | Discharge status: Home / Self Care | Payer: PRIVATE HEALTH INSURANCE | Source: Ambulatory Visit | Attending: Cardiothoracic Surgery | Admitting: Cardiothoracic Surgery

## 2015-10-06 ENCOUNTER — Ambulatory Visit (HOSPITAL_BASED_OUTPATIENT_CLINIC_OR_DEPARTMENT_OTHER)
Admission: RE | Admit: 2015-10-06 | Discharge: 2015-10-06 | Disposition: A | Discharge status: Home / Self Care | Payer: PRIVATE HEALTH INSURANCE | Source: Ambulatory Visit | Attending: Cardiothoracic Surgery | Admitting: Cardiothoracic Surgery

## 2015-10-06 ENCOUNTER — Ambulatory Visit
Admission: RE | Admit: 2015-10-06 | Discharge: 2015-10-06 | Disposition: A | Discharge status: Home / Self Care | Payer: PRIVATE HEALTH INSURANCE | Source: Ambulatory Visit | Attending: Cardiothoracic Surgery | Admitting: Cardiothoracic Surgery

## 2015-10-06 VITALS — BP 129/80 | HR 89 | Temp 97.6°F | Resp 18 | Ht 73.0 in | Wt 215.2 lb

## 2015-10-06 DIAGNOSIS — I2510 Atherosclerotic heart disease of native coronary artery without angina pectoris: Secondary | ICD-10-CM | POA: Diagnosis not present

## 2015-10-06 HISTORY — DX: Personal history of pneumonia (recurrent): Z87.01

## 2015-10-06 HISTORY — DX: Gout, unspecified: M10.9

## 2015-10-06 HISTORY — DX: Unspecified hearing loss, unspecified ear: H91.90

## 2015-10-06 LAB — BLOOD GAS, ARTERIAL
Acid-Base Excess: 0.9 mmol/L (ref 0.0–2.0)
Bicarbonate: 24.5 mEq/L — ABNORMAL HIGH (ref 20.0–24.0)
Drawn by: 421801
FIO2: 0.21
O2 Saturation: 97.6 %
Patient temperature: 98.6
TCO2: 25.6 mmol/L (ref 0–100)
pCO2 arterial: 36.2 mmHg (ref 35.0–45.0)
pH, Arterial: 7.446 (ref 7.350–7.450)
pO2, Arterial: 87.8 mmHg (ref 80.0–100.0)

## 2015-10-06 LAB — SURGICAL PCR SCREEN
MRSA, PCR: NEGATIVE
Staphylococcus aureus: NEGATIVE

## 2015-10-06 LAB — PULMONARY FUNCTION TEST
DL/VA % pred: 112 %
DL/VA: 5.28 ml/min/mmHg/L
DLCO unc % pred: 108 %
DLCO unc: 36.62 ml/min/mmHg
FEF 25-75 Post: 3.14 L/sec
FEF 25-75 Pre: 3.46 L/sec
FEF2575-%Change-Post: -9 %
FEF2575-%Pred-Post: 100 %
FEF2575-%Pred-Pre: 110 %
FEV1-%Change-Post: -2 %
FEV1-%Pred-Post: 99 %
FEV1-%Pred-Pre: 101 %
FEV1-Post: 3.74 L
FEV1-Pre: 3.84 L
FEV1FVC-%Change-Post: 4 %
FEV1FVC-%Pred-Pre: 104 %
FEV6-%Change-Post: -5 %
FEV6-%Pred-Post: 95 %
FEV6-%Pred-Pre: 101 %
FEV6-Post: 4.54 L
FEV6-Pre: 4.82 L
FEV6FVC-%Change-Post: 0 %
FEV6FVC-%Pred-Post: 104 %
FEV6FVC-%Pred-Pre: 103 %
FVC-%Change-Post: -6 %
FVC-%Pred-Post: 91 %
FVC-%Pred-Pre: 97 %
FVC-Post: 4.54 L
FVC-Pre: 4.85 L
Post FEV1/FVC ratio: 82 %
Post FEV6/FVC ratio: 100 %
Pre FEV1/FVC ratio: 79 %
Pre FEV6/FVC Ratio: 99 %
RV % pred: 50 %
RV: 1.15 L
TLC % pred: 105 %
TLC: 7.59 L

## 2015-10-06 LAB — CBC
HCT: 40.9 % (ref 39.0–52.0)
Hemoglobin: 13.9 g/dL (ref 13.0–17.0)
MCH: 30.2 pg (ref 26.0–34.0)
MCHC: 34 g/dL (ref 30.0–36.0)
MCV: 88.9 fL (ref 78.0–100.0)
Platelets: 138 10*3/uL — ABNORMAL LOW (ref 150–400)
RBC: 4.6 MIL/uL (ref 4.22–5.81)
RDW: 12.9 % (ref 11.5–15.5)
WBC: 8.4 10*3/uL (ref 4.0–10.5)

## 2015-10-06 LAB — URINALYSIS, ROUTINE W REFLEX MICROSCOPIC
Bilirubin Urine: NEGATIVE
Glucose, UA: NEGATIVE mg/dL
Ketones, ur: NEGATIVE mg/dL
Leukocytes, UA: NEGATIVE
Nitrite: NEGATIVE
Protein, ur: NEGATIVE mg/dL
Specific Gravity, Urine: 1.018 (ref 1.005–1.030)
pH: 5 (ref 5.0–8.0)

## 2015-10-06 LAB — PROTIME-INR
INR: 1.05 (ref 0.00–1.49)
Prothrombin Time: 13.9 seconds (ref 11.6–15.2)

## 2015-10-06 LAB — URINE MICROSCOPIC-ADD ON
Squamous Epithelial / LPF: NONE SEEN
WBC, UA: NONE SEEN WBC/hpf (ref 0–5)

## 2015-10-06 LAB — COMPREHENSIVE METABOLIC PANEL
ALT: 20 U/L (ref 17–63)
AST: 21 U/L (ref 15–41)
Albumin: 4.2 g/dL (ref 3.5–5.0)
Alkaline Phosphatase: 59 U/L (ref 38–126)
Anion gap: 12 (ref 5–15)
BUN: 10 mg/dL (ref 6–20)
CO2: 21 mmol/L — ABNORMAL LOW (ref 22–32)
Calcium: 9.6 mg/dL (ref 8.9–10.3)
Chloride: 106 mmol/L (ref 101–111)
Creatinine, Ser: 0.81 mg/dL (ref 0.61–1.24)
GFR calc Af Amer: >60 mL/min (ref >60)
GFR calc non Af Amer: >60 mL/min (ref >60)
Glucose, Bld: 107 mg/dL — ABNORMAL HIGH (ref 65–99)
Potassium: 3.8 mmol/L (ref 3.5–5.1)
Sodium: 139 mmol/L (ref 135–145)
Total Bilirubin: 1.2 mg/dL (ref 0.3–1.2)
Total Protein: 6.9 g/dL (ref 6.5–8.1)

## 2015-10-06 LAB — ABO/RH: ABO/RH(D): O POS

## 2015-10-06 LAB — TYPE AND SCREEN
ABO/RH(D): O POS
Antibody Screen: NEGATIVE

## 2015-10-06 LAB — APTT: aPTT: 28 seconds (ref 24–37)

## 2015-10-06 MED ORDER — ALBUTEROL SULFATE (2.5 MG/3ML) 0.083% IN NEBU
2.5000 mg | INHALATION_SOLUTION | Freq: Once | RESPIRATORY_TRACT | Status: AC
  Administered 2015-10-06: 2.5 mg via RESPIRATORY_TRACT

## 2015-10-06 MED ORDER — IOPAMIDOL (ISOVUE-370) INJECTION 76%
100.0000 mL | Freq: Once | INTRAVENOUS | Status: AC | PRN
  Administered 2015-10-06: 100 mL via INTRAVENOUS

## 2015-10-06 NOTE — Progress Notes (Signed)
PCP - Vernie Murders Cardiologist - Dr. Saralyn Pilar  EKG - 10/06/15 CXR - 10/06/15  Echo- denies Stress test - denies Cardiac Cath - 09/30/15  Patient denies chest pain and shortness of breath at PAT appointment.

## 2015-10-06 NOTE — Progress Notes (Signed)
Pre-op Cardiac Surgery  Carotid Findings:  There is no obvious evidence of hemodynamically significant internal carotid artery stenosis bilaterally. Vertebral arteries are patent with antegrade flow.  Upper Extremity Right Left  Brachial Pressures 145-Triphasic 137-Triphasic  Radial Waveforms Triphasic Triphasic  Ulnar Waveforms Triphasic Triphasic  Palmar Arch (Allen's Test) Signal obliterates with radial compression, is unaffected with ulnar compression. Within normal limits.    Lower  Extremity Right Left  Dorsalis Pedis Triphasic Triphasic  Anterior Tibial    Posterior Tibial Triphasic Triphasic  Ankle/Brachial Indices      Findings:   Lower extremity arterial waveforms are within normal limits.  10/06/2015 3:55 PM Omar Christian, RVT, RDCS, RDMS

## 2015-10-06 NOTE — Pre-Procedure Instructions (Signed)
    Jaiven Sandusky  10/06/2015      [id number] - [place], [place] - [address] [place] [address] Phone: [phone] Fax: [phone]    Your procedure is scheduled on Friday, April 7th, 2017.  Report to [facility] Admitting at 5:30 A.M.  Call this number if you have problems the morning of surgery:  [phone]   Remember:  Do not eat food or drink liquids after midnight.   Take these medicines the morning of surgery with A SIP OF WATER: Allopurinol (Zyloprim),  Loratadine (Claritin), Metoprolol Tartrate (Lopressor), Ranitidine (Zantac).  Stop taking: NSAIDS, Aleve, Naproxen, Ibuprofen, Advil, Motrin, BC's, Goody's, Fish oil, all herbal medications, and all vitamins.    Do not wear jewelry.  Do not wear lotions, powders, or colognes.  You may NOT wear deodorant.  Men may shave face and neck.  Do not bring valuables to the hospital.  [facility] is not responsible for any belongings or valuables.  Contacts, dentures or bridgework may not be worn into surgery.  Leave your suitcase in the car.  After surgery it may be brought to your room.  For patients admitted to the hospital, discharge time will be determined by your treatment team.  Patients discharged the day of surgery will not be allowed to drive home.   Special instructions:  See attached.   Please read over the following fact sheets that you were given. Pain Booklet, Coughing and Deep Breathing, Blood Transfusion Information, MRSA Information and Surgical Site Infection Prevention

## 2015-10-06 NOTE — Progress Notes (Signed)
   10/06/15 1238  OBSTRUCTIVE SLEEP APNEA  Have you ever been diagnosed with sleep apnea through a sleep study? No  Do you snore loudly (loud enough to be heard through closed doors)?  1  Do you often feel tired, fatigued, or sleepy during the daytime (such as falling asleep during driving or talking to someone)? 0  Has anyone observed you stop breathing during your sleep? 0  Do you have, or are you being treated for high blood pressure? 1  BMI more than 35 kg/m2? 0  Age > 50 (1-yes) 1  Neck circumference greater than:Male 16 inches or larger, Male 17inches or larger? 1  Male Gender (Yes=1) 1  Obstructive Sleep Apnea Score 5

## 2015-10-07 LAB — HEMOGLOBIN A1C
Hgb A1c MFr Bld: 5.6 % (ref 4.8–5.6)
Mean Plasma Glucose: 114 mg/dL

## 2015-10-07 MED ORDER — VANCOMYCIN HCL 10 G IV SOLR
1500.0000 mg | INTRAVENOUS | Status: AC
  Administered 2015-10-08: 1500 mg via INTRAVENOUS
  Filled 2015-10-07: qty 1500

## 2015-10-07 MED ORDER — PHENYLEPHRINE HCL 10 MG/ML IJ SOLN
30.0000 ug/min | INTRAVENOUS | Status: AC
  Administered 2015-10-08: 20 ug/min via INTRAVENOUS
  Filled 2015-10-07: qty 2

## 2015-10-07 MED ORDER — DEXMEDETOMIDINE HCL IN NACL 400 MCG/100ML IV SOLN
0.1000 ug/kg/h | INTRAVENOUS | Status: AC
  Administered 2015-10-08: .2 ug/kg/h via INTRAVENOUS
  Filled 2015-10-07: qty 100

## 2015-10-07 MED ORDER — DEXTROSE 5 % IV SOLN
1.5000 g | INTRAVENOUS | Status: AC
  Administered 2015-10-08: .75 g via INTRAVENOUS
  Administered 2015-10-08: 1.5 g via INTRAVENOUS
  Filled 2015-10-07 (×2): qty 1.5

## 2015-10-07 MED ORDER — NITROGLYCERIN IN D5W 200-5 MCG/ML-% IV SOLN
2.0000 ug/min | INTRAVENOUS | Status: AC
  Administered 2015-10-08: 5 ug/min via INTRAVENOUS
  Filled 2015-10-07: qty 250

## 2015-10-07 MED ORDER — DEXTROSE 5 % IV SOLN
750.0000 mg | INTRAVENOUS | Status: DC
  Filled 2015-10-07: qty 750

## 2015-10-07 MED ORDER — METOPROLOL TARTRATE 12.5 MG HALF TABLET: 12.5000 mg | ORAL_TABLET | Freq: Once | ORAL | Status: DC

## 2015-10-07 MED ORDER — EPINEPHRINE HCL 1 MG/ML IJ SOLN
0.0000 ug/min | INTRAVENOUS | Status: DC
  Filled 2015-10-07: qty 4

## 2015-10-07 MED ORDER — SODIUM CHLORIDE 0.9 % IV SOLN
INTRAVENOUS | Status: DC
  Filled 2015-10-07: qty 30

## 2015-10-07 MED ORDER — DOPAMINE-DEXTROSE 3.2-5 MG/ML-% IV SOLN
0.0000 ug/kg/min | INTRAVENOUS | Status: DC
  Filled 2015-10-07: qty 250

## 2015-10-07 MED ORDER — MAGNESIUM SULFATE 50 % IJ SOLN
40.0000 meq | INTRAMUSCULAR | Status: DC
  Filled 2015-10-07: qty 10

## 2015-10-07 MED ORDER — POTASSIUM CHLORIDE 2 MEQ/ML IV SOLN
80.0000 meq | INTRAVENOUS | Status: DC
  Filled 2015-10-07: qty 40

## 2015-10-07 MED ORDER — SODIUM CHLORIDE 0.9 % IV SOLN
INTRAVENOUS | Status: AC
  Administered 2015-10-08: 69.8 mL/h via INTRAVENOUS
  Filled 2015-10-07: qty 40

## 2015-10-07 MED ORDER — PLASMA-LYTE 148 IV SOLN
INTRAVENOUS | Status: AC
  Administered 2015-10-08: 500 mL
  Filled 2015-10-07: qty 2.5

## 2015-10-07 MED ORDER — CHLORHEXIDINE GLUCONATE 0.12 % MT SOLN
15.0000 mL | Freq: Once | OROMUCOSAL | Status: AC
  Administered 2015-10-08: 15 mL via OROMUCOSAL
  Filled 2015-10-07: qty 15

## 2015-10-07 MED ORDER — SODIUM CHLORIDE 0.9 % IV SOLN
INTRAVENOUS | Status: AC
  Administered 2015-10-08: 1 [IU]/h via INTRAVENOUS
  Filled 2015-10-07: qty 2.5

## 2015-10-07 NOTE — Anesthesia Preprocedure Evaluation (Addendum)
Anesthesia Evaluation  Patient identified by MRN, date of birth, ID band Patient awake    Reviewed: Allergy & Precautions, NPO status , Patient's Chart, lab work & pertinent test results  History of Anesthesia Complications Negative for: history of anesthetic complications  Airway Mallampati: II  TM Distance: >3 FB Neck ROM: Full    Dental  (+) Teeth Intact, Dental Advisory Given   Pulmonary neg pulmonary ROS,    Pulmonary exam normal        Cardiovascular hypertension, + angina + CAD  Normal cardiovascular exam  Study Conclusions  - Left ventricle: The cavity size was normal. Systolic function was normal. The estimated ejection fraction was in the range of 55% to 60%. Wall motion was normal; there were no regional wall motion abnormalities. Left ventricular diastolic function parameters were normal.    Neuro/Psych negative neurological ROS  negative psych ROS   GI/Hepatic Neg liver ROS, GERD  ,  Endo/Other  negative endocrine ROS  Renal/GU negative Renal ROS     Musculoskeletal negative musculoskeletal ROS (+)   Abdominal   Peds  Hematology negative hematology ROS (+)   Anesthesia Other Findings   Reproductive/Obstetrics                          Anesthesia Physical Anesthesia Plan  ASA: III  Anesthesia Plan: General   Post-op Pain Management:    Induction: Intravenous  Airway Management Planned: Oral ETT  Additional Equipment: Arterial line, PA Cath and 3D TEE  Intra-op Plan:   Post-operative Plan: Possible Post-op intubation/ventilation  Informed Consent: I have reviewed the patients History and Physical, chart, labs and discussed the procedure including the risks, benefits and alternatives for the proposed anesthesia with the patient or authorized representative who has indicated his/her understanding and acceptance.   Dental advisory given  Plan Discussed with:  Anesthesiologist, CRNA and Surgeon  Anesthesia Plan Comments:       Anesthesia Quick Evaluation

## 2015-10-08 ENCOUNTER — Inpatient Hospital Stay (HOSPITAL_COMMUNITY): Payer: PRIVATE HEALTH INSURANCE

## 2015-10-08 ENCOUNTER — Encounter (HOSPITAL_COMMUNITY)
Admission: RE | Disposition: A | Discharge status: Home / Self Care | Payer: Self-pay | Source: Ambulatory Visit | Attending: Cardiothoracic Surgery

## 2015-10-08 ENCOUNTER — Inpatient Hospital Stay (HOSPITAL_COMMUNITY)
Admission: RE | Admit: 2015-10-08 | Discharge: 2015-10-12 | DRG: 236 | Disposition: A | Discharge status: Home / Self Care | Payer: PRIVATE HEALTH INSURANCE | Source: Ambulatory Visit | Attending: Cardiothoracic Surgery | Admitting: Cardiothoracic Surgery

## 2015-10-08 ENCOUNTER — Encounter (HOSPITAL_COMMUNITY): Payer: Self-pay | Admitting: Anesthesiology

## 2015-10-08 ENCOUNTER — Inpatient Hospital Stay (HOSPITAL_COMMUNITY): Payer: PRIVATE HEALTH INSURANCE | Admitting: Anesthesiology

## 2015-10-08 DIAGNOSIS — R0602 Shortness of breath: Secondary | ICD-10-CM

## 2015-10-08 DIAGNOSIS — Z951 Presence of aortocoronary bypass graft: Secondary | ICD-10-CM

## 2015-10-08 DIAGNOSIS — Z79899 Other long term (current) drug therapy: Secondary | ICD-10-CM

## 2015-10-08 DIAGNOSIS — H9190 Unspecified hearing loss, unspecified ear: Secondary | ICD-10-CM | POA: Diagnosis present

## 2015-10-08 DIAGNOSIS — I2510 Atherosclerotic heart disease of native coronary artery without angina pectoris: Secondary | ICD-10-CM

## 2015-10-08 DIAGNOSIS — R079 Chest pain, unspecified: Secondary | ICD-10-CM | POA: Diagnosis present

## 2015-10-08 DIAGNOSIS — I25110 Atherosclerotic heart disease of native coronary artery with unstable angina pectoris: Principal | ICD-10-CM | POA: Diagnosis present

## 2015-10-08 DIAGNOSIS — E785 Hyperlipidemia, unspecified: Secondary | ICD-10-CM | POA: Diagnosis present

## 2015-10-08 DIAGNOSIS — I10 Essential (primary) hypertension: Secondary | ICD-10-CM | POA: Diagnosis present

## 2015-10-08 DIAGNOSIS — M109 Gout, unspecified: Secondary | ICD-10-CM | POA: Diagnosis present

## 2015-10-08 DIAGNOSIS — K219 Gastro-esophageal reflux disease without esophagitis: Secondary | ICD-10-CM | POA: Diagnosis present

## 2015-10-08 DIAGNOSIS — D62 Acute posthemorrhagic anemia: Secondary | ICD-10-CM | POA: Diagnosis not present

## 2015-10-08 DIAGNOSIS — D696 Thrombocytopenia, unspecified: Secondary | ICD-10-CM | POA: Diagnosis not present

## 2015-10-08 HISTORY — PX: TEE WITHOUT CARDIOVERSION: SHX5443

## 2015-10-08 HISTORY — PX: CORONARY ARTERY BYPASS GRAFT: SHX141

## 2015-10-08 LAB — CBC
HCT: 32.8 % — ABNORMAL LOW (ref 39.0–52.0)
HCT: 31.5 % — ABNORMAL LOW (ref 39.0–52.0)
Hemoglobin: 11.5 g/dL — ABNORMAL LOW (ref 13.0–17.0)
Hemoglobin: 11.1 g/dL — ABNORMAL LOW (ref 13.0–17.0)
MCH: 31.3 pg (ref 26.0–34.0)
MCH: 31.2 pg (ref 26.0–34.0)
MCHC: 35.1 g/dL (ref 30.0–36.0)
MCHC: 35.2 g/dL (ref 30.0–36.0)
MCV: 89.4 fL (ref 78.0–100.0)
MCV: 88.5 fL (ref 78.0–100.0)
Platelets: 103 10*3/uL — ABNORMAL LOW (ref 150–400)
Platelets: 118 10*3/uL — ABNORMAL LOW (ref 150–400)
RBC: 3.67 MIL/uL — ABNORMAL LOW (ref 4.22–5.81)
RBC: 3.56 MIL/uL — ABNORMAL LOW (ref 4.22–5.81)
RDW: 12.7 % (ref 11.5–15.5)
RDW: 12.6 % (ref 11.5–15.5)
WBC: 9 10*3/uL (ref 4.0–10.5)
WBC: 10.2 10*3/uL (ref 4.0–10.5)

## 2015-10-08 LAB — POCT I-STAT, CHEM 8
BUN: 13 mg/dL (ref 6–20)
BUN: 12 mg/dL (ref 6–20)
BUN: 13 mg/dL (ref 6–20)
BUN: 12 mg/dL (ref 6–20)
BUN: 12 mg/dL (ref 6–20)
BUN: 11 mg/dL (ref 6–20)
Calcium, Ion: 1.22 mmol/L (ref 1.12–1.23)
Calcium, Ion: 1.07 mmol/L — ABNORMAL LOW (ref 1.12–1.23)
Calcium, Ion: 1.22 mmol/L (ref 1.12–1.23)
Calcium, Ion: 1.11 mmol/L — ABNORMAL LOW (ref 1.12–1.23)
Calcium, Ion: 1.16 mmol/L (ref 1.12–1.23)
Calcium, Ion: 1.14 mmol/L (ref 1.12–1.23)
Chloride: 103 mmol/L (ref 101–111)
Chloride: 101 mmol/L (ref 101–111)
Chloride: 103 mmol/L (ref 101–111)
Chloride: 100 mmol/L — ABNORMAL LOW (ref 101–111)
Chloride: 102 mmol/L (ref 101–111)
Chloride: 105 mmol/L (ref 101–111)
Creatinine, Ser: 0.6 mg/dL — ABNORMAL LOW (ref 0.61–1.24)
Creatinine, Ser: 0.6 mg/dL — ABNORMAL LOW (ref 0.61–1.24)
Creatinine, Ser: 0.6 mg/dL — ABNORMAL LOW (ref 0.61–1.24)
Creatinine, Ser: 0.6 mg/dL — ABNORMAL LOW (ref 0.61–1.24)
Creatinine, Ser: 0.7 mg/dL (ref 0.61–1.24)
Creatinine, Ser: 0.6 mg/dL — ABNORMAL LOW (ref 0.61–1.24)
Glucose, Bld: 122 mg/dL — ABNORMAL HIGH (ref 65–99)
Glucose, Bld: 120 mg/dL — ABNORMAL HIGH (ref 65–99)
Glucose, Bld: 122 mg/dL — ABNORMAL HIGH (ref 65–99)
Glucose, Bld: 152 mg/dL — ABNORMAL HIGH (ref 65–99)
Glucose, Bld: 154 mg/dL — ABNORMAL HIGH (ref 65–99)
Glucose, Bld: 139 mg/dL — ABNORMAL HIGH (ref 65–99)
HCT: 34 % — ABNORMAL LOW (ref 39.0–52.0)
HCT: 30 % — ABNORMAL LOW (ref 39.0–52.0)
HCT: 33 % — ABNORMAL LOW (ref 39.0–52.0)
HCT: 29 % — ABNORMAL LOW (ref 39.0–52.0)
HCT: 30 % — ABNORMAL LOW (ref 39.0–52.0)
HCT: 29 % — ABNORMAL LOW (ref 39.0–52.0)
Hemoglobin: 11.6 g/dL — ABNORMAL LOW (ref 13.0–17.0)
Hemoglobin: 10.2 g/dL — ABNORMAL LOW (ref 13.0–17.0)
Hemoglobin: 11.2 g/dL — ABNORMAL LOW (ref 13.0–17.0)
Hemoglobin: 9.9 g/dL — ABNORMAL LOW (ref 13.0–17.0)
Hemoglobin: 10.2 g/dL — ABNORMAL LOW (ref 13.0–17.0)
Hemoglobin: 9.9 g/dL — ABNORMAL LOW (ref 13.0–17.0)
Potassium: 3.8 mmol/L (ref 3.5–5.1)
Potassium: 3.8 mmol/L (ref 3.5–5.1)
Potassium: 4.4 mmol/L (ref 3.5–5.1)
Potassium: 5 mmol/L (ref 3.5–5.1)
Potassium: 4.5 mmol/L (ref 3.5–5.1)
Potassium: 3.8 mmol/L (ref 3.5–5.1)
Sodium: 140 mmol/L (ref 135–145)
Sodium: 138 mmol/L (ref 135–145)
Sodium: 140 mmol/L (ref 135–145)
Sodium: 135 mmol/L (ref 135–145)
Sodium: 138 mmol/L (ref 135–145)
Sodium: 138 mmol/L (ref 135–145)
TCO2: 29 mmol/L (ref 0–100)
TCO2: 27 mmol/L (ref 0–100)
TCO2: 29 mmol/L (ref 0–100)
TCO2: 26 mmol/L (ref 0–100)
TCO2: 24 mmol/L (ref 0–100)
TCO2: 22 mmol/L (ref 0–100)

## 2015-10-08 LAB — GLUCOSE, CAPILLARY
Glucose-Capillary: 113 mg/dL — ABNORMAL HIGH (ref 65–99)
Glucose-Capillary: 111 mg/dL — ABNORMAL HIGH (ref 65–99)
Glucose-Capillary: 125 mg/dL — ABNORMAL HIGH (ref 65–99)
Glucose-Capillary: 132 mg/dL — ABNORMAL HIGH (ref 65–99)
Glucose-Capillary: 140 mg/dL — ABNORMAL HIGH (ref 65–99)
Glucose-Capillary: 128 mg/dL — ABNORMAL HIGH (ref 65–99)

## 2015-10-08 LAB — POCT I-STAT 4, (NA,K, GLUC, HGB,HCT)
Glucose, Bld: 128 mg/dL — ABNORMAL HIGH (ref 65–99)
HCT: 30 % — ABNORMAL LOW (ref 39.0–52.0)
Hemoglobin: 10.2 g/dL — ABNORMAL LOW (ref 13.0–17.0)
Potassium: 4 mmol/L (ref 3.5–5.1)
Sodium: 140 mmol/L (ref 135–145)

## 2015-10-08 LAB — POCT I-STAT 3, ART BLOOD GAS (G3+)
Acid-Base Excess: 3 mmol/L — ABNORMAL HIGH (ref 0.0–2.0)
Acid-base deficit: 1 mmol/L (ref 0.0–2.0)
Acid-base deficit: 2 mmol/L (ref 0.0–2.0)
Acid-base deficit: 3 mmol/L — ABNORMAL HIGH (ref 0.0–2.0)
Bicarbonate: 27.9 mEq/L — ABNORMAL HIGH (ref 20.0–24.0)
Bicarbonate: 25.1 mEq/L — ABNORMAL HIGH (ref 20.0–24.0)
Bicarbonate: 24.2 mEq/L — ABNORMAL HIGH (ref 20.0–24.0)
Bicarbonate: 23.7 mEq/L (ref 20.0–24.0)
Bicarbonate: 22.5 mEq/L (ref 20.0–24.0)
O2 Saturation: 100 %
O2 Saturation: 100 %
O2 Saturation: 95 %
O2 Saturation: 94 %
O2 Saturation: 96 %
Patient temperature: 36.4
Patient temperature: 37
Patient temperature: 37.3
TCO2: 29 mmol/L (ref 0–100)
TCO2: 26 mmol/L (ref 0–100)
TCO2: 25 mmol/L (ref 0–100)
TCO2: 25 mmol/L (ref 0–100)
TCO2: 24 mmol/L (ref 0–100)
pCO2 arterial: 41.7 mmHg (ref 35.0–45.0)
pCO2 arterial: 41.5 mmHg (ref 35.0–45.0)
pCO2 arterial: 40.2 mmHg (ref 35.0–45.0)
pCO2 arterial: 43.7 mmHg (ref 35.0–45.0)
pCO2 arterial: 40.7 mmHg (ref 35.0–45.0)
pH, Arterial: 7.433 (ref 7.350–7.450)
pH, Arterial: 7.39 (ref 7.350–7.450)
pH, Arterial: 7.385 (ref 7.350–7.450)
pH, Arterial: 7.343 — ABNORMAL LOW (ref 7.350–7.450)
pH, Arterial: 7.351 (ref 7.350–7.450)
pO2, Arterial: 343 mmHg — ABNORMAL HIGH (ref 80.0–100.0)
pO2, Arterial: 221 mmHg — ABNORMAL HIGH (ref 80.0–100.0)
pO2, Arterial: 77 mmHg — ABNORMAL LOW (ref 80.0–100.0)
pO2, Arterial: 73 mmHg — ABNORMAL LOW (ref 80.0–100.0)
pO2, Arterial: 88 mmHg (ref 80.0–100.0)

## 2015-10-08 LAB — HEMOGLOBIN AND HEMATOCRIT, BLOOD
HCT: 29.8 % — ABNORMAL LOW (ref 39.0–52.0)
Hemoglobin: 10 g/dL — ABNORMAL LOW (ref 13.0–17.0)

## 2015-10-08 LAB — MAGNESIUM: Magnesium: 2.5 mg/dL — ABNORMAL HIGH (ref 1.7–2.4)

## 2015-10-08 LAB — CREATININE, SERUM
Creatinine, Ser: 0.75 mg/dL (ref 0.61–1.24)
GFR calc Af Amer: >60 mL/min (ref >60)
GFR calc non Af Amer: >60 mL/min (ref >60)

## 2015-10-08 LAB — APTT: aPTT: 30 seconds (ref 24–37)

## 2015-10-08 LAB — PROTIME-INR
INR: 1.4 (ref 0.00–1.49)
Prothrombin Time: 17.2 seconds — ABNORMAL HIGH (ref 11.6–15.2)

## 2015-10-08 LAB — PLATELET COUNT: Platelets: 131 K/uL — ABNORMAL LOW (ref 150–400)

## 2015-10-08 SURGERY — CORONARY ARTERY BYPASS GRAFTING (CABG)
Anesthesia: General | Site: Chest

## 2015-10-08 MED ORDER — SODIUM CHLORIDE 0.45 % IV SOLN: INTRAVENOUS | Status: DC | PRN

## 2015-10-08 MED ORDER — PROPOFOL 10 MG/ML IV BOLUS
INTRAVENOUS | Status: AC
  Filled 2015-10-08: qty 20

## 2015-10-08 MED ORDER — ROCURONIUM BROMIDE 50 MG/5ML IV SOLN
INTRAVENOUS | Status: AC
  Filled 2015-10-08: qty 2

## 2015-10-08 MED ORDER — BISACODYL 10 MG RE SUPP: 10.0000 mg | Freq: Every day | RECTAL | Status: DC

## 2015-10-08 MED ORDER — VANCOMYCIN HCL IN DEXTROSE 1-5 GM/200ML-% IV SOLN
1000.0000 mg | Freq: Once | INTRAVENOUS | Status: AC
  Administered 2015-10-08: 1000 mg via INTRAVENOUS
  Filled 2015-10-08: qty 200

## 2015-10-08 MED ORDER — ASPIRIN 81 MG PO CHEW: 324.0000 mg | CHEWABLE_TABLET | Freq: Every day | ORAL | Status: DC

## 2015-10-08 MED ORDER — LACTATED RINGERS IV SOLN
INTRAVENOUS | Status: DC | PRN
  Administered 2015-10-08: 07:00:00 via INTRAVENOUS

## 2015-10-08 MED ORDER — MIDAZOLAM HCL 5 MG/5ML IJ SOLN
INTRAMUSCULAR | Status: DC | PRN
  Administered 2015-10-08: 2 mg via INTRAVENOUS
  Administered 2015-10-08 (×2): 3 mg via INTRAVENOUS
  Administered 2015-10-08 (×2): 2 mg via INTRAVENOUS

## 2015-10-08 MED ORDER — HEPARIN SODIUM (PORCINE) 1000 UNIT/ML IJ SOLN
INTRAMUSCULAR | Status: DC | PRN
  Administered 2015-10-08: 32000 [IU] via INTRAVENOUS

## 2015-10-08 MED ORDER — INSULIN REGULAR BOLUS VIA INFUSION
0.0000 [IU] | Freq: Three times a day (TID) | INTRAVENOUS | Status: DC
  Filled 2015-10-08: qty 10

## 2015-10-08 MED ORDER — HEPARIN SODIUM (PORCINE) 1000 UNIT/ML IJ SOLN
INTRAMUSCULAR | Status: AC
  Filled 2015-10-08: qty 1

## 2015-10-08 MED ORDER — FLUTICASONE PROPIONATE 50 MCG/ACT NA SUSP
2.0000 | Freq: Every day | NASAL | Status: DC
  Administered 2015-10-09 – 2015-10-11 (×3): 2 via NASAL
  Filled 2015-10-08: qty 16

## 2015-10-08 MED ORDER — CHLORHEXIDINE GLUCONATE 0.12 % MT SOLN
15.0000 mL | OROMUCOSAL | Status: AC
  Administered 2015-10-08: 15 mL via OROMUCOSAL
  Filled 2015-10-08: qty 15

## 2015-10-08 MED ORDER — MAGNESIUM SULFATE 4 GM/100ML IV SOLN
4.0000 g | Freq: Once | INTRAVENOUS | Status: AC
  Administered 2015-10-08: 4 g via INTRAVENOUS
  Filled 2015-10-08: qty 100

## 2015-10-08 MED ORDER — ONDANSETRON HCL 4 MG/2ML IJ SOLN
4.0000 mg | Freq: Four times a day (QID) | INTRAMUSCULAR | Status: DC | PRN
Start: 2015-10-08 — End: 2015-10-12
  Administered 2015-10-09: 4 mg via INTRAVENOUS
  Filled 2015-10-08: qty 2

## 2015-10-08 MED ORDER — SODIUM CHLORIDE 0.9 % IJ SOLN
INTRAMUSCULAR | Status: AC
  Filled 2015-10-08: qty 10

## 2015-10-08 MED ORDER — OXYCODONE HCL 5 MG PO TABS
5.0000 mg | ORAL_TABLET | ORAL | Status: DC | PRN
  Administered 2015-10-09: 10 mg via ORAL
  Filled 2015-10-08: qty 2

## 2015-10-08 MED ORDER — NITROGLYCERIN IN D5W 200-5 MCG/ML-% IV SOLN: 0.0000 ug/min | INTRAVENOUS | Status: DC

## 2015-10-08 MED ORDER — SUCCINYLCHOLINE CHLORIDE 20 MG/ML IJ SOLN
INTRAMUSCULAR | Status: AC
  Filled 2015-10-08: qty 1

## 2015-10-08 MED ORDER — ALBUMIN HUMAN 5 % IV SOLN
INTRAVENOUS | Status: DC | PRN
  Administered 2015-10-08 (×2): via INTRAVENOUS

## 2015-10-08 MED ORDER — METOCLOPRAMIDE HCL 5 MG/ML IJ SOLN
10.0000 mg | Freq: Four times a day (QID) | INTRAMUSCULAR | Status: AC
  Administered 2015-10-08 – 2015-10-09 (×3): 10 mg via INTRAVENOUS
  Filled 2015-10-08 (×2): qty 2

## 2015-10-08 MED ORDER — MORPHINE SULFATE (PF) 2 MG/ML IV SOLN
2.0000 mg | INTRAVENOUS | Status: DC | PRN
  Administered 2015-10-08 – 2015-10-09 (×2): 4 mg via INTRAVENOUS
  Filled 2015-10-08 (×2): qty 2

## 2015-10-08 MED ORDER — SODIUM CHLORIDE 0.9 % IV SOLN
250.0000 mL | INTRAVENOUS | Status: DC
  Administered 2015-10-09: 250 mL via INTRAVENOUS

## 2015-10-08 MED ORDER — EPHEDRINE SULFATE 50 MG/ML IJ SOLN
INTRAMUSCULAR | Status: AC
  Filled 2015-10-08: qty 1

## 2015-10-08 MED ORDER — BISACODYL 5 MG PO TBEC
10.0000 mg | DELAYED_RELEASE_TABLET | Freq: Every day | ORAL | Status: DC
  Administered 2015-10-09 – 2015-10-11 (×3): 10 mg via ORAL
  Filled 2015-10-08 (×4): qty 2

## 2015-10-08 MED ORDER — ROCURONIUM BROMIDE 100 MG/10ML IV SOLN
INTRAVENOUS | Status: DC | PRN
  Administered 2015-10-08: 100 mg via INTRAVENOUS

## 2015-10-08 MED ORDER — CHLORHEXIDINE GLUCONATE 0.12% ORAL RINSE (MEDLINE KIT)
15.0000 mL | Freq: Two times a day (BID) | OROMUCOSAL | Status: DC
  Administered 2015-10-08: 15 mL via OROMUCOSAL

## 2015-10-08 MED ORDER — SODIUM CHLORIDE 0.9 % IV SOLN: INTRAVENOUS | Status: DC

## 2015-10-08 MED ORDER — MIDAZOLAM HCL 10 MG/2ML IJ SOLN
INTRAMUSCULAR | Status: AC
  Filled 2015-10-08: qty 2

## 2015-10-08 MED ORDER — PANTOPRAZOLE SODIUM 40 MG PO TBEC
40.0000 mg | DELAYED_RELEASE_TABLET | Freq: Every day | ORAL | Status: DC
  Administered 2015-10-10 – 2015-10-12 (×3): 40 mg via ORAL
  Filled 2015-10-08 (×3): qty 1

## 2015-10-08 MED ORDER — LACTATED RINGERS IV SOLN
INTRAVENOUS | Status: DC
  Administered 2015-10-08: 20 mL/h via INTRAVENOUS

## 2015-10-08 MED ORDER — LIDOCAINE HCL (CARDIAC) 20 MG/ML IV SOLN
INTRAVENOUS | Status: AC
  Filled 2015-10-08: qty 5

## 2015-10-08 MED ORDER — FAMOTIDINE IN NACL 20-0.9 MG/50ML-% IV SOLN
20.0000 mg | Freq: Two times a day (BID) | INTRAVENOUS | Status: AC
  Administered 2015-10-08 (×2): 20 mg via INTRAVENOUS
  Filled 2015-10-08: qty 50

## 2015-10-08 MED ORDER — TRAMADOL HCL 50 MG PO TABS
50.0000 mg | ORAL_TABLET | ORAL | Status: DC | PRN
  Administered 2015-10-08 – 2015-10-09 (×2): 50 mg via ORAL
  Administered 2015-10-09 – 2015-10-10 (×2): 100 mg via ORAL
  Administered 2015-10-11: 50 mg via ORAL
  Administered 2015-10-12: 100 mg via ORAL
  Filled 2015-10-08 (×2): qty 1
  Filled 2015-10-08: qty 2
  Filled 2015-10-08: qty 1
  Filled 2015-10-08 (×2): qty 2

## 2015-10-08 MED ORDER — PROTAMINE SULFATE 10 MG/ML IV SOLN
INTRAVENOUS | Status: AC
  Filled 2015-10-08: qty 5

## 2015-10-08 MED ORDER — MORPHINE SULFATE (PF) 2 MG/ML IV SOLN
1.0000 mg | INTRAVENOUS | Status: DC | PRN
  Administered 2015-10-08: 4 mg via INTRAVENOUS
  Filled 2015-10-08 (×2): qty 2

## 2015-10-08 MED ORDER — LACTATED RINGERS IV SOLN
INTRAVENOUS | Status: DC
Start: 2015-10-08 — End: 2015-10-12

## 2015-10-08 MED ORDER — FAMOTIDINE 20 MG PO TABS
20.0000 mg | ORAL_TABLET | Freq: Every day | ORAL | Status: DC
  Administered 2015-10-09 – 2015-10-12 (×3): 20 mg via ORAL
  Filled 2015-10-08 (×3): qty 1

## 2015-10-08 MED ORDER — MIDAZOLAM HCL 2 MG/2ML IJ SOLN: 2.0000 mg | INTRAMUSCULAR | Status: DC | PRN

## 2015-10-08 MED ORDER — METOPROLOL TARTRATE 1 MG/ML IV SOLN: 2.5000 mg | INTRAVENOUS | Status: DC | PRN

## 2015-10-08 MED ORDER — FENTANYL CITRATE (PF) 100 MCG/2ML IJ SOLN
INTRAMUSCULAR | Status: DC | PRN
  Administered 2015-10-08 (×2): 50 ug via INTRAVENOUS
  Administered 2015-10-08 (×8): 100 ug via INTRAVENOUS
  Administered 2015-10-08: 500 ug via INTRAVENOUS

## 2015-10-08 MED ORDER — ANTISEPTIC ORAL RINSE SOLUTION (CORINZ)
7.0000 mL | Freq: Four times a day (QID) | OROMUCOSAL | Status: DC
  Administered 2015-10-09 (×2): 7 mL via OROMUCOSAL

## 2015-10-08 MED ORDER — ONDANSETRON HCL 4 MG/2ML IJ SOLN
INTRAMUSCULAR | Status: AC
  Filled 2015-10-08: qty 2

## 2015-10-08 MED ORDER — PHENYLEPHRINE HCL 10 MG/ML IJ SOLN: 0.0000 ug/min | INTRAVENOUS | Status: DC

## 2015-10-08 MED ORDER — STERILE WATER FOR INJECTION IJ SOLN
INTRAMUSCULAR | Status: AC
  Filled 2015-10-08: qty 10

## 2015-10-08 MED ORDER — ASPIRIN EC 325 MG PO TBEC
325.0000 mg | DELAYED_RELEASE_TABLET | Freq: Every day | ORAL | Status: DC
  Administered 2015-10-09 – 2015-10-12 (×4): 325 mg via ORAL
  Filled 2015-10-08 (×4): qty 1

## 2015-10-08 MED ORDER — FENTANYL CITRATE (PF) 250 MCG/5ML IJ SOLN
INTRAMUSCULAR | Status: AC
  Filled 2015-10-08: qty 5

## 2015-10-08 MED ORDER — ACETAMINOPHEN 500 MG PO TABS
1000.0000 mg | ORAL_TABLET | Freq: Four times a day (QID) | ORAL | Status: DC
  Administered 2015-10-08 – 2015-10-12 (×13): 1000 mg via ORAL
  Filled 2015-10-08 (×13): qty 2

## 2015-10-08 MED ORDER — VECURONIUM BROMIDE 10 MG IV SOLR
INTRAVENOUS | Status: DC | PRN
  Administered 2015-10-08: 2 mg via INTRAVENOUS
  Administered 2015-10-08: 6 mg via INTRAVENOUS
  Administered 2015-10-08 (×2): 2 mg via INTRAVENOUS
  Administered 2015-10-08: 6 mg via INTRAVENOUS
  Administered 2015-10-08: 2 mg via INTRAVENOUS

## 2015-10-08 MED ORDER — DEXMEDETOMIDINE HCL IN NACL 200 MCG/50ML IV SOLN
INTRAVENOUS | Status: AC
  Filled 2015-10-08: qty 50

## 2015-10-08 MED ORDER — LACTATED RINGERS IV SOLN: 500.0000 mL | Freq: Once | INTRAVENOUS | Status: DC | PRN

## 2015-10-08 MED ORDER — DOCUSATE SODIUM 100 MG PO CAPS
200.0000 mg | ORAL_CAPSULE | Freq: Every day | ORAL | Status: DC
  Administered 2015-10-09 – 2015-10-11 (×3): 200 mg via ORAL
  Filled 2015-10-08 (×4): qty 2

## 2015-10-08 MED ORDER — ATORVASTATIN CALCIUM 20 MG PO TABS
20.0000 mg | ORAL_TABLET | Freq: Every day | ORAL | Status: DC
  Administered 2015-10-09 – 2015-10-11 (×3): 20 mg via ORAL
  Filled 2015-10-08 (×3): qty 1

## 2015-10-08 MED ORDER — METOPROLOL TARTRATE 25 MG/10 ML ORAL SUSPENSION: 12.5000 mg | Freq: Two times a day (BID) | ORAL | Status: DC

## 2015-10-08 MED ORDER — DEXTROSE 5 % IV SOLN
1.5000 g | Freq: Two times a day (BID) | INTRAVENOUS | Status: AC
  Administered 2015-10-08 – 2015-10-10 (×4): 1.5 g via INTRAVENOUS
  Filled 2015-10-08 (×4): qty 1.5

## 2015-10-08 MED ORDER — SODIUM CHLORIDE 0.9 % IJ SOLN
OROMUCOSAL | Status: DC | PRN
  Administered 2015-10-08 (×3): via TOPICAL

## 2015-10-08 MED ORDER — VECURONIUM BROMIDE 10 MG IV SOLR
INTRAVENOUS | Status: AC
  Filled 2015-10-08: qty 10

## 2015-10-08 MED ORDER — SODIUM CHLORIDE 0.9% FLUSH
3.0000 mL | INTRAVENOUS | Status: DC | PRN
  Administered 2015-10-10: 3 mL via INTRAVENOUS
  Filled 2015-10-08: qty 3

## 2015-10-08 MED ORDER — LACTATED RINGERS IV SOLN
INTRAVENOUS | Status: DC | PRN
  Administered 2015-10-08 (×2): via INTRAVENOUS

## 2015-10-08 MED ORDER — POTASSIUM CHLORIDE 10 MEQ/50ML IV SOLN: 10.0000 meq | INTRAVENOUS | Status: AC

## 2015-10-08 MED ORDER — SODIUM CHLORIDE 0.9% FLUSH
3.0000 mL | Freq: Two times a day (BID) | INTRAVENOUS | Status: DC
  Administered 2015-10-09 (×2): 3 mL via INTRAVENOUS

## 2015-10-08 MED ORDER — ALBUMIN HUMAN 5 % IV SOLN
250.0000 mL | INTRAVENOUS | Status: AC | PRN
  Administered 2015-10-08 (×3): 250 mL via INTRAVENOUS
  Filled 2015-10-08: qty 250

## 2015-10-08 MED ORDER — MIDAZOLAM HCL 2 MG/2ML IJ SOLN
INTRAMUSCULAR | Status: AC
  Filled 2015-10-08: qty 2

## 2015-10-08 MED ORDER — PROTAMINE SULFATE 10 MG/ML IV SOLN
INTRAVENOUS | Status: DC | PRN
  Administered 2015-10-08: 300 mg via INTRAVENOUS

## 2015-10-08 MED ORDER — DEXMEDETOMIDINE HCL IN NACL 200 MCG/50ML IV SOLN
0.0000 ug/kg/h | INTRAVENOUS | Status: DC
  Administered 2015-10-08: 0.7 ug/kg/h via INTRAVENOUS
  Filled 2015-10-08: qty 50

## 2015-10-08 MED ORDER — METOPROLOL TARTRATE 12.5 MG HALF TABLET
12.5000 mg | ORAL_TABLET | Freq: Two times a day (BID) | ORAL | Status: DC
  Administered 2015-10-09 – 2015-10-12 (×7): 12.5 mg via ORAL
  Filled 2015-10-08 (×7): qty 1

## 2015-10-08 MED ORDER — SODIUM CHLORIDE 0.9 % IV SOLN
INTRAVENOUS | Status: DC
  Administered 2015-10-08: 2.6 [IU]/h via INTRAVENOUS
  Filled 2015-10-08: qty 2.5

## 2015-10-08 MED ORDER — ACETAMINOPHEN 650 MG RE SUPP
650.0000 mg | Freq: Once | RECTAL | Status: AC
  Administered 2015-10-08: 650 mg via RECTAL

## 2015-10-08 MED ORDER — FENTANYL CITRATE (PF) 250 MCG/5ML IJ SOLN
INTRAMUSCULAR | Status: AC
  Filled 2015-10-08: qty 25

## 2015-10-08 MED ORDER — ACETAMINOPHEN 160 MG/5ML PO SOLN: 1000.0000 mg | Freq: Four times a day (QID) | ORAL | Status: DC

## 2015-10-08 MED ORDER — PROPOFOL 10 MG/ML IV BOLUS
INTRAVENOUS | Status: DC | PRN
  Administered 2015-10-08: 120 mg via INTRAVENOUS

## 2015-10-08 MED ORDER — PROTAMINE SULFATE 10 MG/ML IV SOLN
INTRAVENOUS | Status: AC
  Filled 2015-10-08: qty 25

## 2015-10-08 MED ORDER — ACETAMINOPHEN 160 MG/5ML PO SOLN: 650.0000 mg | Freq: Once | ORAL | Status: AC

## 2015-10-08 MED FILL — Magnesium Sulfate Inj 50%: INTRAMUSCULAR | Qty: 10 | Status: AC

## 2015-10-08 MED FILL — Potassium Chloride Inj 2 mEq/ML: INTRAVENOUS | Qty: 40 | Status: AC

## 2015-10-08 MED FILL — Heparin Sodium (Porcine) Inj 1000 Unit/ML: INTRAMUSCULAR | Qty: 30 | Status: AC

## 2015-10-08 SURGICAL SUPPLY — 67 items
BAG DECANTER FOR FLEXI CONT (MISCELLANEOUS) ×3 IMPLANT
BANDAGE ELASTIC 4 VELCRO ST LF (GAUZE/BANDAGES/DRESSINGS) ×3 IMPLANT
BANDAGE ELASTIC 6 VELCRO ST LF (GAUZE/BANDAGES/DRESSINGS) ×3 IMPLANT
BLADE STERNUM SYSTEM 6 (BLADE) ×3 IMPLANT
BNDG GAUZE ELAST 4 BULKY (GAUZE/BANDAGES/DRESSINGS) ×3 IMPLANT
CANISTER SUCTION 2500CC (MISCELLANEOUS) ×3 IMPLANT
CATH CPB KIT GERHARDT (MISCELLANEOUS) ×3 IMPLANT
CATH THORACIC 28FR (CATHETERS) ×3 IMPLANT
CRADLE DONUT ADULT HEAD (MISCELLANEOUS) ×3 IMPLANT
DERMABOND ADVANCED (GAUZE/BANDAGES/DRESSINGS) ×1
DERMABOND ADVANCED .7 DNX12 (GAUZE/BANDAGES/DRESSINGS) ×2 IMPLANT
DRAIN CHANNEL 28F RND 3/8 FF (WOUND CARE) ×3 IMPLANT
DRAPE CARDIOVASCULAR INCISE (DRAPES) ×1
DRAPE SLUSH/WARMER DISC (DRAPES) ×3 IMPLANT
DRAPE SRG 135X102X78XABS (DRAPES) ×2 IMPLANT
DRSG AQUACEL AG ADV 3.5X14 (GAUZE/BANDAGES/DRESSINGS) ×3 IMPLANT
ELECT BLADE 4.0 EZ CLEAN MEGAD (MISCELLANEOUS) ×3
ELECT REM PT RETURN 9FT ADLT (ELECTROSURGICAL) ×6
ELECTRODE BLDE 4.0 EZ CLN MEGD (MISCELLANEOUS) ×2 IMPLANT
ELECTRODE REM PT RTRN 9FT ADLT (ELECTROSURGICAL) ×4 IMPLANT
FELT TEFLON 1X6 (MISCELLANEOUS) ×3 IMPLANT
GAUZE SPONGE 4X4 12PLY STRL (GAUZE/BANDAGES/DRESSINGS) ×6 IMPLANT
GLOVE BIO SURGEON STRL SZ 6.5 (GLOVE) ×9 IMPLANT
GOWN STRL REUS W/ TWL LRG LVL3 (GOWN DISPOSABLE) ×8 IMPLANT
GOWN STRL REUS W/TWL LRG LVL3 (GOWN DISPOSABLE) ×4
HEMOSTAT POWDER SURGIFOAM 1G (HEMOSTASIS) ×9 IMPLANT
HEMOSTAT SURGICEL 2X14 (HEMOSTASIS) ×6 IMPLANT
KIT BASIN OR (CUSTOM PROCEDURE TRAY) ×3 IMPLANT
KIT CATH SUCT 8FR (CATHETERS) ×3 IMPLANT
KIT ROOM TURNOVER OR (KITS) ×3 IMPLANT
KIT SUCTION CATH 14FR (SUCTIONS) ×6 IMPLANT
KIT VASOVIEW 6 PRO VH 2400 (KITS) ×3 IMPLANT
LEAD PACING MYOCARDI (MISCELLANEOUS) ×3 IMPLANT
MARKER GRAFT CORONARY BYPASS (MISCELLANEOUS) ×9 IMPLANT
NS IRRIG 1000ML POUR BTL (IV SOLUTION) ×15 IMPLANT
PACK OPEN HEART (CUSTOM PROCEDURE TRAY) ×3 IMPLANT
PAD ARMBOARD 7.5X6 YLW CONV (MISCELLANEOUS) ×6 IMPLANT
PAD ELECT DEFIB RADIOL ZOLL (MISCELLANEOUS) ×3 IMPLANT
PENCIL BUTTON HOLSTER BLD 10FT (ELECTRODE) ×3 IMPLANT
PUNCH AORTIC ROTATE 4.5MM 8IN (MISCELLANEOUS) ×3 IMPLANT
SET CARDIOPLEGIA MPS 5001102 (MISCELLANEOUS) ×3 IMPLANT
SOLUTION ANTI FOG 6CC (MISCELLANEOUS) ×3 IMPLANT
SPONGE GAUZE 4X4 12PLY STER LF (GAUZE/BANDAGES/DRESSINGS) ×6 IMPLANT
SPONGE LAP 18X18 X RAY DECT (DISPOSABLE) ×9 IMPLANT
SURGIFLO W/THROMBIN 8M KIT (HEMOSTASIS) ×3 IMPLANT
SUT BONE WAX W31G (SUTURE) ×3 IMPLANT
SUT MNCRL AB 4-0 PS2 18 (SUTURE) ×3 IMPLANT
SUT PROLENE 3 0 SH1 36 (SUTURE) ×3 IMPLANT
SUT PROLENE 4 0 TF (SUTURE) ×6 IMPLANT
SUT PROLENE 6 0 C 1 30 (SUTURE) ×6 IMPLANT
SUT PROLENE 6 0 CC (SUTURE) ×9 IMPLANT
SUT PROLENE 7 0 BV1 MDA (SUTURE) ×9 IMPLANT
SUT PROLENE 8 0 BV175 6 (SUTURE) ×15 IMPLANT
SUT STEEL 6MS V (SUTURE) ×3 IMPLANT
SUT STEEL SZ 6 DBL 3X14 BALL (SUTURE) ×3 IMPLANT
SUT VIC AB 1 CTX 18 (SUTURE) ×6 IMPLANT
SUT VIC AB 2-0 CT1 27 (SUTURE) ×1
SUT VIC AB 2-0 CT1 TAPERPNT 27 (SUTURE) ×2 IMPLANT
SUTURE E-PAK OPEN HEART (SUTURE) ×3 IMPLANT
SYSTEM SAHARA CHEST DRAIN ATS (WOUND CARE) ×3 IMPLANT
TAPE CLOTH SURG 4X10 WHT LF (GAUZE/BANDAGES/DRESSINGS) ×6 IMPLANT
TOWEL OR 17X24 6PK STRL BLUE (TOWEL DISPOSABLE) ×6 IMPLANT
TOWEL OR 17X26 10 PK STRL BLUE (TOWEL DISPOSABLE) ×6 IMPLANT
TRAY FOLEY IC TEMP SENS 16FR (CATHETERS) ×3 IMPLANT
TUBING INSUFFLATION (TUBING) ×3 IMPLANT
UNDERPAD 30X30 INCONTINENT (UNDERPADS AND DIAPERS) ×3 IMPLANT
WATER STERILE IRR 1000ML POUR (IV SOLUTION) ×6 IMPLANT

## 2015-10-08 NOTE — Brief Op Note (Addendum)
      [address]       [place] [address]             [phone]      10/08/2015  11:36 AM  PATIENT:  Omar Christian  60 y.o. male  PRE-OPERATIVE DIAGNOSIS:  CAD, unstable angina  POST-OPERATIVE DIAGNOSIS:  same PROCEDURE:  Procedure(s):  CORONARY ARTERY BYPASS GRAFTING x4 -LIMA to LAD -SVG to DIAGONAL -SVG to OM -SVG to PDA ( off cx)  ENDOSCOPIC HARVEST GREATER SAPHENOUS VEIN -Right Leg  TRANSESOPHAGEAL ECHOCARDIOGRAM (TEE) (N/A)  SURGEON:  Surgeon(s) and Role:    * [name], MD - Primary  PHYSICIAN ASSISTANT: Erin Barrett PA-C  ANESTHESIA:   general  EBL:  Total I/O In: 1400 [I.V.:1400] Out: 375 [Urine:375]  BLOOD ADMINISTERED: CELLSAVER only   DRAINS: Left Pleural Chest Tube, Mediastinal Chest Drains   LOCAL MEDICATIONS USED:  NONE  SPECIMEN:  No Specimen  DISPOSITION OF SPECIMEN:  N/A  COUNTS:  YES  DICTATION: .Dragon Dictation  PLAN OF CARE: Admit to inpatient   PATIENT DISPOSITION:  ICU - intubated and hemodynamically stable.   Delay start of Pharmacological VTE agent (>24hrs) due to surgical blood loss or risk of bleeding: yes

## 2015-10-08 NOTE — Progress Notes (Signed)
CT surgery p.m. Rounds  Doing well after multivessel CABG today Hemodynamics stable on low-dose phenylephrine Postop labs reviewed and are satisfactory Minimal chest tube output

## 2015-10-08 NOTE — Procedures (Addendum)
Extubation Procedure Note  Patient Details:   Name: Omar Christian DOB: January 14, 1956 MRN: [id number]   Airway Documentation:     Evaluation  O2 sats: stable throughout Complications: No apparent complications Patient did tolerate procedure well. Bilateral Breath Sounds: Clear   Yes   NIF - 24, VC 1.13 and positive cuff leak noted prior to extubation. Patient placed on nasal cannula 4 Lpm with humidity, sat 97%, no stridor noted.  Bayard Beaver 10/08/2015, 5:56 PM

## 2015-10-08 NOTE — Progress Notes (Signed)
  Echocardiogram Echocardiogram Transesophageal has been performed.  Jennette Dubin 10/08/2015, 9:03 AM

## 2015-10-08 NOTE — H&P (Signed)
[address]       [place] [address]             [phone]                    [facility] Medical Record [id number] Date of Birth: April 04, 1956  Referring: Paraschos Primary Care: Vernie Murders, PA  Chief Complaint:    Chest pain    History of Present Illness:    Omar Christian 60 y.o. male is seen in the office as  an elective referral from Dr. Saralyn Pilar for coronary artery disease. Patient has had at least 6 months if not longer history of chest discomfort, indigestion sometimes with exertion and sometimes at rest. He notes that walking uphill or upstairs makes the symptoms worse and take several minutes of rest before they resolve. The patient has had no previous cardiac history. A nuclear stress test was performed in [place] on March 27 with EKG evidence of ischemia with exertion ejection fraction 44% apical wall hypokinesis moderate inferior and apical wall ischemia. Echocardiogram was performed at [facility]. clinic on March 24 demonstrating dilated aortic root dilated ascending aorta to 4.5 cm ejection fraction 55% mild mitral and aortic insufficiency. Cardiac catheterization was performed at [facility] on 09/30/2015. The right coronary artery was not identified, but appears to have a left dominant system so likely the right coronary is small,  patient has severe disease in the left system including proximal LAD first obtuse marginal and circumflex, there is evidence of a ascending aortic dilatation, but this is not fully evaluated.  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 Not known to be diabetic   Patient returns today for cardiac surgery, he had limited his activity at home as instructed and had no further symptoms over past week.  Current Activity/ Functional Status:  Patient is independent with mobility/ambulation, transfers, ADL's, IADL's.   Zubrod Score: At the time of  surgery this patient's most appropriate activity status/level should be described as: [x]     0    Normal activity, no symptoms []     1    Restricted in physical strenuous activity but ambulatory, able to do out light work []     2    Ambulatory and capable of self care, unable to do work activities, up and about               >50 % of waking hours                              []     3    Only limited self care, in bed greater than 50% of waking hours []     4    Completely disabled, no self care, confined to bed or chair []     5    Moribund   Past Medical History  Diagnosis Date  . Hypertension   . GERD (gastroesophageal reflux disease)   . Coronary artery disease   . Hyperlipidemia   . H/O echocardiogram 09/24/15    [facility]  . Abnormal myocardial perfusion study 09/24/15    [facility]  . Abnormal findings on cardiac catheterization 09/30/15    [facility]  . Progressive angina ([place])   . CAD, multiple vessel 09/30/15    per CATH @ [facility]/DR.PARACHOS  . Gout   . Hard of hearing   .  History of pneumonia     Past Surgical History  Procedure Laterality Date  . Cardiac catheterization N/A 09/30/2015    Procedure: Left Heart Cath and Coronary Angiography;  Surgeon: [name], MD;  Location: [facility];  Service: Cardiovascular;  Laterality: N/A;  . Wisdom tooth extraction      Family History  Problem Relation Age of Onset  . Heart disease Mother   . Alcohol abuse Father   . Throat cancer Father   . Muscular dystrophy Brother   . Muscular dystrophy Brother   . Hypertension Mother   Patient's mother died at age 31 of acute myocardial infarction father died at age 57 with carcinoma the throat, 2 brothers died of muscular dystrophy he has 2 living sisters 1 who is had coronary stents the other with severe curvature of the spine and respiratory difficulty on home oxygen, he has 2 daughters 1 with kidney problems and one with lupus  Social History   Social History    . Marital Status: Married    Spouse Name: N/A  . Number of Children: N/A  . Years of Education: N/A   Occupational History  . 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 Smoking status: Never Smoker   . Smokeless tobacco: Never Used  . Alcohol Use: No  . Drug Use: No      History  Smoking status  . Never Smoker   Smokeless tobacco  . Never Used    History  Alcohol Use No     No Known Allergies  Current Facility-Administered Medications  Medication Dose Route Frequency Provider Last Rate Last Dose  . aminocaproic acid (AMICAR) 10 g in sodium chloride 0.9 % 100 mL infusion   Intravenous To OR [name], MD      . cefUROXime (ZINACEF) 1.5 g in dextrose 5 % 50 mL IVPB  1.5 g Intravenous To OR [name], MD      . cefUROXime (ZINACEF) 750 mg in dextrose 5 % 50 mL IVPB  750 mg Intravenous To OR [name], MD      . dexmedetomidine (PRECEDEX) 400 MCG/100ML (4 mcg/mL) infusion  0.1-0.7 mcg/kg/hr Intravenous To OR [name], MD      . DOPamine (INTROPIN) 800 mg in dextrose 5 % 250 mL (3.2 mg/mL) infusion  0-10 mcg/kg/min Intravenous To OR [name], MD      . EPINEPHrine (ADRENALIN) 4 mg in dextrose 5 % 250 mL (0.016 mg/mL) infusion  0-10 mcg/min Intravenous To OR [name], MD      . heparin 2,500 Units, papaverine 30 mg in electrolyte-148 (PLASMALYTE-148) 500 mL irrigation   Irrigation To OR [name], MD      . heparin 30,000 units/NS 1000 mL solution for CELLSAVER   Other To OR [name], MD      . insulin regular (NOVOLIN R,HUMULIN R) 250 Units in sodium chloride 0.9 % 250 mL (1 Units/mL) infusion   Intravenous To OR [name], MD      . magnesium sulfate (IV Push/IM) injection 40 mEq  40 mEq Other To OR [name], MD      . metoprolol tartrate (LOPRESSOR) tablet 12.5 mg  12.5 mg Oral Once [name], MD      . nitroGLYCERIN 50 mg  in dextrose 5 % 250 mL (0.2 mg/mL) infusion  2-200  mcg/min Intravenous To OR [name], MD      . phenylephrine (NEO-SYNEPHRINE) 20 mg in dextrose 5 % 250 mL (0.08 mg/mL) infusion  30-200 mcg/min Intravenous To OR [name], MD      . potassium chloride injection 80 mEq  80 mEq Other To OR [name], MD      . vancomycin (VANCOCIN) 1,500 mg in sodium chloride 0.9 % 250 mL IVPB  1,500 mg Intravenous To OR [name], MD       Facility-Administered Medications Ordered in Other Encounters  Medication Dose Route Frequency Provider Last Rate Last Dose  . lactated ringers infusion    Continuous PRN Mosie Epstein, CRNA      . lactated ringers infusion    Continuous PRN Mosie Epstein, CRNA          Review of Systems:     Cardiac Review of Systems: Y or N  Chest Pain [  y  ]  Resting SOB [ n  ] Exertional SOB  [ n ]  Omar Christian  ]   Pedal Edema [ n  ]    Palpitations [ n ] Syncope  [n  ]   Presyncope [ n ]  General Review of Systems: [Y] = yes [  ]=no Constitional: recent weight change [n ];  Wt loss over the last 3 months [   ] anorexia [  ]; fatigue [ n ]; nausea [ n ]; night sweats [n  ]; fever [  ]; or chills [  ];          Dental: poor dentition[  ]; Last Dentist visit:   Eye : blurred vision [n  ]; diplopia [   ]; vision changes [  ];  Amaurosis fugax[  ]; Resp: cough [  ];  wheezing[  ];  hemoptysis[n  ]; shortness of breath[ n ]; paroxysmal nocturnal dyspnea[n  ]; dyspnea on exertion[  ]; or orthopnea[  ];  GI:  gallstones[  ], vomiting[  ];  dysphagia[  ]; melena[  ];  hematochezia [  ]; heartburn[ y ];   Hx of  Colonoscopy[  ]; GU: kidney stones [  ]; hematuria[  ];   dysuria [  ];  nocturia[  ];  history of     obstruction [  ]; urinary frequency [ n ]             Skin: rash, swelling[  ];, hair loss[  ];  peripheral edema[  n];  or itching[  ]; Musculosketetal: myalgias[  ];  joint swelling[ n ];  joint erythema[  ];  joint pain[  ];  back pain[   ];  Heme/Lymph: bruising[  ];  bleeding[  ];  anemia[  ];  Neuro: TIA[ n ];  headaches[  ];  stroke[ n ];  vertigo[  ];  seizures[  ];   paresthesias[  ];  difficulty walking[  ];  Psych:depression[n  ]; anxiety[ n ];  Endocrine: diabetes[ n ];  thyroid dysfunction[n  ];  Immunizations: Flu up to date [?  ]; Pneumococcal up to date [?  ];  Other:  Physical Exam: BP 133/73 mmHg  Pulse 65  Temp(Src) 97.6 F (36.4 C)  Resp 16  Wt 215 lb 3.2 oz (97.614 kg)  SpO2 98%  PHYSICAL EXAMINATION: General appearance: alert, cooperative and no distress Head: Normocephalic, without obvious abnormality, atraumatic Neck: no adenopathy, no carotid bruit, no JVD, supple, symmetrical,  trachea midline and thyroid not enlarged, symmetric, no tenderness/mass/nodules Lymph nodes: Cervical, supraclavicular, and axillary nodes normal. Resp: clear to auscultation bilaterally Back: symmetric, no curvature. ROM normal. No CVA tenderness. Cardio: regular rate and rhythm, S1, S2 normal, no murmur, click, rub or gallop GI: soft, non-tender; bowel sounds normal; no masses,  no organomegaly Extremities: extremities normal, atraumatic, no cyanosis or edema and Homans sign is negative, no sign of DVT Neurologic: Grossly normal Appears to have adequate vein for grafting  Diagnostic Studies & Laboratory data:     Recent Radiology Findings:   Dg Chest 2 View  10/06/2015  CLINICAL DATA:  Coronary artery disease.  Pre-op respiratory exam EXAM: CHEST  2 VIEW COMPARISON:  None. FINDINGS: The heart size and mediastinal contours are within normal limits. Mild ectasia of thoracic aorta noted. Both lungs are clear. No evidence of pneumothorax or pleural effusion. The visualized skeletal structures are unremarkable. IMPRESSION: No active cardiopulmonary disease. Electronically Signed   By: Earle Gell M.D.   On: 10/06/2015 14:07   Ct Angio Chest Aorta W/cm &/or Wo/cm  10/06/2015  CLINICAL DATA:  Rule out a thoracic aortic  aneurysm. Scheduled for open heart surgery. EXAM: CT ANGIOGRAPHY CHEST WITH CONTRAST TECHNIQUE: Multidetector CT imaging of the chest was performed using the standard protocol during bolus administration of intravenous contrast. Multiplanar CT image reconstructions and MIPs were obtained to evaluate the vascular anatomy. CONTRAST:  100 mL Isovue 370 COMPARISON:  Chest radiograph 10/06/2015 FINDINGS: Mediastinum/Lymph Nodes: The aortic root at the sinuses of Valsalva is enlarged, measuring up to 4.4 cm. The mid ascending thoracic aorta is enlarged measuring 4.4 cm. Proximal aortic arch measures up to 4 cm. There is typical aortic arch anatomy and the great vessels are patent. Descending thoracic aorta measures 2.7 cm. Negative for an aortic dissection. Calcifications involving the left coronary arteries. No evidence for a large or central pulmonary embolism. Normal appearance of the thyroid tissue. There is no evidence for chest lymphadenopathy. Small amount of fluid in pericardial recess. No gross abnormality to the esophagus. Lungs/Pleura: Trachea and mainstem bronchi are patent. 4 mm pleural-based nodule in the right lower lobe on sequence 11, image 49. Pleural-based nodule along the anterior right middle lobe on sequence 11, image 36, measures 3 mm on the sagittal reformats. 3 mm nodule in the right middle lobe on sequence 11, image 42. There are a few other punctate nodular densities in the right lower lung. No large areas of airspace disease or consolidation. There is volume loss or scarring in the lingula. No pleural effusions. Upper abdomen: Proximal abdominal aorta has a normal caliber. The celiac trunk and splenic artery are very prominent. There is a round peripherally enhancing structure in the splenic hilum which is nonspecific and measures up to 1.3 cm. Musculoskeletal: No suspicious bone findings. Review of the MIP images confirms the above findings. 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 Ascending thoracic aorta measures up to 4.4 cm. Recommend annual imaging followup by CTA or MRA. This recommendation follows 2010 ACCF/AHA/AATS/ACR/ASA/SCA/SCAI/SIR/STS/SVM Guidelines for the Diagnosis and Management of Patients with Thoracic Aortic Disease. Circulation. 2010; 121: [id number] Few small nodular densities scattered throughout the lungs. Largest measures up to 4 mm. These small nodular densities are nonspecific. No follow-up needed if patient is low-risk (and has no known or suspected primary neoplasm). Non-contrast chest CT can be considered in 12 months if patient is high-risk. This recommendation follows the consensus statement: Guidelines for Management of Incidental Pulmonary Nodules  Detected on CT Images:From the Fleischner Society 2017; published online before print (10.1148/radiol.IJ:2314499). There is a 1.3 cm enhancing structure in the spleen near the hilum. This area is incompletely characterized due the arterial phase of contrast and unclear if this is a lesion or vascular malformation/structure. Recommend attention to this area on follow up imaging. Electronically Signed   By: Markus Daft M.D.   On: 10/06/2015 17:22     Recent Lab Findings: Lab Results  Component Value Date   WBC 8.4 10/06/2015   HGB 13.9 10/06/2015   HCT 40.9 10/06/2015   PLT 138* 10/06/2015   GLUCOSE 107* 10/06/2015   CHOL 206* 08/27/2015   TRIG 134 08/27/2015   HDL 45 08/27/2015   LDLCALC 134* 08/27/2015   ALT 20 10/06/2015   AST 21 10/06/2015   NA 139 10/06/2015   K 3.8 10/06/2015   CL 106 10/06/2015   CREATININE 0.81 10/06/2015   BUN 10 10/06/2015   CO2 21* 10/06/2015   TSH 0.973 08/27/2015   INR 1.05 10/06/2015   HGBA1C 5.6 10/06/2015   ECHO: done [place]: INTERPRETATION  NORMAL LEFT VENTRICULAR SYSTOLIC FUNCTION WITH AN ESTIMATED EF = 55 %  NORMAL RIGHT VENTRICULAR SYSTOLIC FUNCTION  MILD MITRAL AND AORTIC VALVE INSUFFICIENCY  TRACE TRICUSPID  VALVE INSUFFICIENCY  NO VALVULAR STENOSIS  MILDLY DILATED AORTIC ROOT  MILDLY DILATED ASCENDING AORTA  4.5 cm MILD LVH   CATH:  [name], MD (Primary)      Procedures    Left Heart Cath and Coronary Angiography    Conclusion     Ost LAD to Prox LAD lesion, 95% stenosed.  Prox Cx lesion, 95% stenosed.  Ost 1st Mrg to 1st Mrg lesion, 80% stenosed.  Mid Cx lesion, 95% stenosed.  Mid LAD to Dist LAD lesion, 50% stenosed.  Dist LAD lesion, 50% stenosed.  1. Severe three-vessel coronary artery disease with high-grade 95% stenosis proximal LAD, 80% proximal large dominant left circumflex, 80% OM1, 95% mid left circumflex. 2. Preserved left ventricular function with anteroapical hypokinesis     I have independently reviewed the above  cath films and reviewed the findings with the  patient .- Ascending aorta appears dilated, no aortic insufficiency noted, right coronary artery is not identified, likely small with distal circumflex supplying the posterior descending.  Assessment / Plan:   Patient is a 60 year old male not known to be diabetic, with severe LAD and circumflex disease and positive stress test. The distal LAD is a small poor quality vessel suggestive of diabetic patient. I reviewed the films and discussed with the patient his wife and daughter and agree with recommendation for coronary artery bypass grafting, next week. Prior to surgery w a CTA of the chest to accurately measure the ascending aorta. The patient was started on beta blocker , aspirin or statin. He  started on Lopressor 12.5 mg by mouth twice a day, statin, 81 mg of aspirin a day. Risks and options have been discussed with the patient his wife and daughter in detail and their questions have been answered.  The goals risks and alternatives of the planned surgical procedure CABG  have been discussed with the patient in detail. The risks of the procedure including death, infection, stroke,  myocardial infarction, bleeding, blood transfusion have all been discussed specifically.  I have quoted Omar Christian a 3 % of perioperative mortality and a complication rate as high as 30 %. The patient's questions have been answered.Omar Christian is willing  to proceed with the planned  procedure. In addition to other potential risks and complications from the surgery, I have made the patient aware of the recent [place] concerning the risk of infection by Myocobacterium chimaera related to the use of Stockert 3T heater-cooler equipment during cardiac surgery. I discussed with the patient the low risk of infection, as well as our compliance with the most current FDA recommendations to minimize infection and testing of all devices for contamination. The patient has been made aware of the limited alternatives to immediately replacing the current equipment. The patient has been informed regarding the risks associated with waiting to proceed with needed surgery and that such risks are greater than the risk of infection related to the use of the heater-cooler device. I did make the patient aware that after careful review of the patients having cardiac surgery at [facility] we have no evidence that heater/cooler related infections have occurred at [facility]. We discussed that this is a slow-growing bacterium, such that it can take some period of time for symptoms to develop.    [name] MD      [address] [place] [address] Office [phone]   Beeper [phone]  10/08/2015 6:53 AM

## 2015-10-08 NOTE — Transfer of Care (Signed)
Immediate Anesthesia Transfer of Care Note  Patient: Hairo Dooly  Procedure(s) Performed: Procedure(s): CORONARY ARTERY BYPASS GRAFTING time 4 using left internal mammary and right greater saphenous vein harvested by endovein (N/A) TRANSESOPHAGEAL ECHOCARDIOGRAM (TEE) (N/A)  Patient Location: SICU  Anesthesia Type:General  Level of Consciousness: sedated, unresponsive and Patient remains intubated per anesthesia plan  Airway & Oxygen Therapy: Patient remains intubated per anesthesia plan and Patient placed on Ventilator (see vital sign flow sheet for setting)  Post-op Assessment: Report given to RN and Post -op Vital signs reviewed and stable  Post vital signs: Reviewed and stable  Last Vitals:  Filed Vitals:   10/08/15 0602 10/08/15 1401  BP: 133/73 100/66  Pulse: 65 90  Temp: 36.4 C 36.4 C  Resp: 16 12    Complications: No apparent anesthesia complications

## 2015-10-08 NOTE — Anesthesia Postprocedure Evaluation (Signed)
Anesthesia Post Note  Patient: Omar Christian  Procedure(s) Performed: Procedure(s) (LRB): CORONARY ARTERY BYPASS GRAFTING time 4 using left internal mammary and right greater saphenous vein harvested by endovein (N/A) TRANSESOPHAGEAL ECHOCARDIOGRAM (TEE) (N/A)  Patient location during evaluation: SICU Anesthesia Type: General Level of consciousness: sedated Pain management: pain level controlled Vital Signs Assessment: post-procedure vital signs reviewed and stable Respiratory status: patient remains intubated per anesthesia plan Cardiovascular status: stable Anesthetic complications: no    Last Vitals:  Filed Vitals:   10/08/15 0602 10/08/15 1401  BP: 133/73 100/66  Pulse: 65 90  Temp: 36.4 C 36.4 C  Resp: 16 12    Last Pain: There were no vitals filed for this visit.               Broly Hatfield DANIEL

## 2015-10-08 NOTE — Anesthesia Procedure Notes (Signed)
Procedure Name: Intubation Date/Time: 10/08/2015 7:51 AM Performed by: Kyung Rudd Pre-anesthesia Checklist: Patient identified, Emergency Drugs available, Suction available, Patient being monitored and Timeout performed Patient Re-evaluated:Patient Re-evaluated prior to inductionOxygen Delivery Method: Circle system utilized Preoxygenation: Pre-oxygenation with 100% oxygen Intubation Type: IV induction Ventilation: Mask ventilation without difficulty Laryngoscope Size: Mac and 4 Grade View: Grade II Tube type: Oral Tube size: 8.0 mm Number of attempts: 1 Airway Equipment and Method: Stylet Placement Confirmation: ETT inserted through vocal cords under direct vision,  positive ETCO2 and breath sounds checked- equal and bilateral Secured at: 21 cm Tube secured with: Tape Dental Injury: Teeth and Oropharynx as per pre-operative assessment

## 2015-10-09 ENCOUNTER — Inpatient Hospital Stay (HOSPITAL_COMMUNITY): Payer: PRIVATE HEALTH INSURANCE

## 2015-10-09 ENCOUNTER — Other Ambulatory Visit: Payer: Self-pay | Admitting: Family Medicine

## 2015-10-09 LAB — POCT I-STAT, CHEM 8
BUN: 15 mg/dL (ref 6–20)
Calcium, Ion: 1.17 mmol/L (ref 1.12–1.23)
Chloride: 102 mmol/L (ref 101–111)
Creatinine, Ser: 0.8 mg/dL (ref 0.61–1.24)
Glucose, Bld: 137 mg/dL — ABNORMAL HIGH (ref 65–99)
HCT: 32 % — ABNORMAL LOW (ref 39.0–52.0)
Hemoglobin: 10.9 g/dL — ABNORMAL LOW (ref 13.0–17.0)
Potassium: 4.8 mmol/L (ref 3.5–5.1)
Sodium: 137 mmol/L (ref 135–145)
TCO2: 22 mmol/L (ref 0–100)

## 2015-10-09 LAB — GLUCOSE, CAPILLARY
Glucose-Capillary: 120 mg/dL — ABNORMAL HIGH (ref 65–99)
Glucose-Capillary: 122 mg/dL — ABNORMAL HIGH (ref 65–99)
Glucose-Capillary: 122 mg/dL — ABNORMAL HIGH (ref 65–99)
Glucose-Capillary: 124 mg/dL — ABNORMAL HIGH (ref 65–99)
Glucose-Capillary: 126 mg/dL — ABNORMAL HIGH (ref 65–99)
Glucose-Capillary: 126 mg/dL — ABNORMAL HIGH (ref 65–99)
Glucose-Capillary: 126 mg/dL — ABNORMAL HIGH (ref 65–99)
Glucose-Capillary: 128 mg/dL — ABNORMAL HIGH (ref 65–99)
Glucose-Capillary: 129 mg/dL — ABNORMAL HIGH (ref 65–99)
Glucose-Capillary: 132 mg/dL — ABNORMAL HIGH (ref 65–99)
Glucose-Capillary: 133 mg/dL — ABNORMAL HIGH (ref 65–99)
Glucose-Capillary: 148 mg/dL — ABNORMAL HIGH (ref 65–99)
Glucose-Capillary: 159 mg/dL — ABNORMAL HIGH (ref 65–99)
Glucose-Capillary: 169 mg/dL — ABNORMAL HIGH (ref 65–99)

## 2015-10-09 LAB — CREATININE, SERUM
Creatinine, Ser: 0.76 mg/dL (ref 0.61–1.24)
GFR calc Af Amer: >60 mL/min (ref >60)
GFR calc non Af Amer: >60 mL/min (ref >60)

## 2015-10-09 LAB — MAGNESIUM
Magnesium: 2.2 mg/dL (ref 1.7–2.4)
Magnesium: 2.4 mg/dL (ref 1.7–2.4)

## 2015-10-09 LAB — CBC
HCT: 29.6 % — ABNORMAL LOW (ref 39.0–52.0)
HCT: 31.7 % — ABNORMAL LOW (ref 39.0–52.0)
Hemoglobin: 9.9 g/dL — ABNORMAL LOW (ref 13.0–17.0)
Hemoglobin: 10.3 g/dL — ABNORMAL LOW (ref 13.0–17.0)
MCH: 29.8 pg (ref 26.0–34.0)
MCH: 29.4 pg (ref 26.0–34.0)
MCHC: 33.4 g/dL (ref 30.0–36.0)
MCHC: 32.5 g/dL (ref 30.0–36.0)
MCV: 89.2 fL (ref 78.0–100.0)
MCV: 90.6 fL (ref 78.0–100.0)
Platelets: 102 10*3/uL — ABNORMAL LOW (ref 150–400)
Platelets: 129 10*3/uL — ABNORMAL LOW (ref 150–400)
RBC: 3.32 MIL/uL — ABNORMAL LOW (ref 4.22–5.81)
RBC: 3.5 MIL/uL — ABNORMAL LOW (ref 4.22–5.81)
RDW: 12.6 % (ref 11.5–15.5)
RDW: 13 % (ref 11.5–15.5)
WBC: 7.2 10*3/uL (ref 4.0–10.5)
WBC: 9.6 10*3/uL (ref 4.0–10.5)

## 2015-10-09 LAB — BASIC METABOLIC PANEL
Anion gap: 9 (ref 5–15)
BUN: 9 mg/dL (ref 6–20)
CO2: 23 mmol/L (ref 22–32)
Calcium: 7.8 mg/dL — ABNORMAL LOW (ref 8.9–10.3)
Chloride: 106 mmol/L (ref 101–111)
Creatinine, Ser: 0.64 mg/dL (ref 0.61–1.24)
GFR calc Af Amer: >60 mL/min (ref >60)
GFR calc non Af Amer: >60 mL/min (ref >60)
Glucose, Bld: 136 mg/dL — ABNORMAL HIGH (ref 65–99)
Potassium: 3.7 mmol/L (ref 3.5–5.1)
Sodium: 138 mmol/L (ref 135–145)

## 2015-10-09 MED ORDER — KETOROLAC TROMETHAMINE 15 MG/ML IJ SOLN
15.0000 mg | Freq: Four times a day (QID) | INTRAMUSCULAR | Status: DC
  Administered 2015-10-09 – 2015-10-10 (×5): 15 mg via INTRAVENOUS
  Filled 2015-10-09 (×5): qty 1

## 2015-10-09 MED ORDER — INSULIN ASPART 100 UNIT/ML ~~LOC~~ SOLN
0.0000 [IU] | SUBCUTANEOUS | Status: DC
  Administered 2015-10-09: 2 [IU] via SUBCUTANEOUS
  Administered 2015-10-09: 4 [IU] via SUBCUTANEOUS
  Administered 2015-10-10 (×3): 2 [IU] via SUBCUTANEOUS

## 2015-10-09 MED ORDER — CETYLPYRIDINIUM CHLORIDE 0.05 % MT LIQD
7.0000 mL | Freq: Two times a day (BID) | OROMUCOSAL | Status: DC
  Administered 2015-10-09 (×2): 7 mL via OROMUCOSAL

## 2015-10-09 MED ORDER — POTASSIUM CHLORIDE CRYS ER 20 MEQ PO TBCR
20.0000 meq | EXTENDED_RELEASE_TABLET | Freq: Two times a day (BID) | ORAL | Status: DC
  Administered 2015-10-09 – 2015-10-10 (×3): 20 meq via ORAL
  Filled 2015-10-09 (×3): qty 1

## 2015-10-09 MED ORDER — POTASSIUM CHLORIDE 10 MEQ/50ML IV SOLN
10.0000 meq | INTRAVENOUS | Status: AC
  Administered 2015-10-09 (×3): 10 meq via INTRAVENOUS

## 2015-10-09 NOTE — Progress Notes (Signed)
K+= 3.7 and creat= 0.64 w/ urine o/p > 30cc/hr; TCTS KCL protocol initiated with 10 mEq KCL in 50cc IV x 3, each over one hour.

## 2015-10-09 NOTE — Progress Notes (Signed)
SICU p.m. Rounds  Patient examined and record reviewed.Hemodynamics stable,labs satisfactory.Patient had stable day.Continue current care. Tharon Aquas Trigt III 10/09/2015

## 2015-10-09 NOTE — Progress Notes (Signed)
1 Day Post-Op Procedure(s) (LRB): CORONARY ARTERY BYPASS GRAFTING time 4 using left internal mammary and right greater saphenous vein harvested by endovein (N/A) TRANSESOPHAGEAL ECHOCARDIOGRAM (TEE) (N/A) Subjective: nsr Stable hemodynamics Mod pain  Objective: Vital signs in last 24 hours: Temp:  [97.2 F (36.2 C)-100.8 F (38.2 C)] 98.4 F (36.9 C) (04/08 0800) Pulse Rate:  [75-91] 75 (04/08 0800) Cardiac Rhythm:  [-] Normal sinus rhythm (04/08 0800) Resp:  [12-31] 18 (04/08 0800) BP: (94-117)/(57-81) 109/60 mmHg (04/08 0800) SpO2:  [96 %-100 %] 98 % (04/08 0800) Arterial Line BP: (83-147)/(49-75) 133/53 mmHg (04/08 0800) FiO2 (%):  [40 %-50 %] 40 % (04/07 1712) Weight:  [215 lb 3.2 oz (97.614 kg)-219 lb 9.3 oz (99.6 kg)] 219 lb 9.3 oz (99.6 kg) (04/08 0500)  Hemodynamic parameters for last 24 hours: PAP: (20-35)/(10-23) 31/15 mmHg CO:  [4.8 L/min-8.3 L/min] 7.7 L/min CI:  [2.1 L/min/m2-3.8 L/min/m2] 3.5 L/min/m2  Intake/Output from previous day: 04/07 0701 - 04/08 0700 In: 7347.3 [P.O.:600; I.V.:4256.3; Blood:661; NG/GT:30; IV Piggyback:1800] Out: [id number] [Urine:2015; Emesis/NG output:30; Blood:1500; Chest Tube:570] Intake/Output this shift: Total I/O In: 90 [I.V.:40; IV Piggyback:50] Out: 100 [Urine:80; Chest Tube:20]  lungs clear No air leak  Lab Results:  Recent Labs  10/08/15 2008 10/09/15 0410  WBC 10.2 7.2  HGB 11.1*  9.9* 9.9*  HCT 31.5*  29.0* 29.6*  PLT 118* 102*   BMET:  Recent Labs  10/06/15 1326  10/08/15 2008 10/09/15 0410  NA 139  < > 138 138  K 3.8  < > 3.8 3.7  CL 106  < > 105 106  CO2 21*  --   --  23  GLUCOSE 107*  < > 139* 136*  BUN 10  < > 11 9  CREATININE 0.81  < > 0.75  0.60* 0.64  CALCIUM 9.6  --   --  7.8*  < > = values in this interval not displayed.  PT/INR:  Recent Labs  10/08/15 1414  LABPROT 17.2*  INR 1.40   ABG    Component Value Date/Time   PHART 7.351 10/08/2015 1845   HCO3 22.5 10/08/2015 1845   TCO2 22  10/08/2015 2008   ACIDBASEDEF 3.0* 10/08/2015 1845   O2SAT 96.0 10/08/2015 1845   CBG (last 3)   Recent Labs  10/09/15 0252 10/09/15 0342 10/09/15 0454  GLUCAP 124* 122* 126*    Assessment/Plan: S/P Procedure(s) (LRB): CORONARY ARTERY BYPASS GRAFTING time 4 using left internal mammary and right greater saphenous vein harvested by endovein (N/A) TRANSESOPHAGEAL ECHOCARDIOGRAM (TEE) (N/A) Mobilize Diuresis d/c tubes/lines See progression orders   LOS: 1 day    Tharon Aquas Trigt III 10/09/2015

## 2015-10-10 ENCOUNTER — Inpatient Hospital Stay (HOSPITAL_COMMUNITY): Payer: PRIVATE HEALTH INSURANCE

## 2015-10-10 LAB — CBC
HCT: 26.9 % — ABNORMAL LOW (ref 39.0–52.0)
Hemoglobin: 8.7 g/dL — ABNORMAL LOW (ref 13.0–17.0)
MCH: 29.5 pg (ref 26.0–34.0)
MCHC: 32.3 g/dL (ref 30.0–36.0)
MCV: 91.2 fL (ref 78.0–100.0)
Platelets: 99 10*3/uL — ABNORMAL LOW (ref 150–400)
RBC: 2.95 MIL/uL — ABNORMAL LOW (ref 4.22–5.81)
RDW: 12.9 % (ref 11.5–15.5)
WBC: 7.4 10*3/uL (ref 4.0–10.5)

## 2015-10-10 LAB — BASIC METABOLIC PANEL
Anion gap: 8 (ref 5–15)
BUN: 18 mg/dL (ref 6–20)
CO2: 25 mmol/L (ref 22–32)
Calcium: 8.1 mg/dL — ABNORMAL LOW (ref 8.9–10.3)
Chloride: 105 mmol/L (ref 101–111)
Creatinine, Ser: 0.94 mg/dL (ref 0.61–1.24)
GFR calc Af Amer: >60 mL/min (ref >60)
GFR calc non Af Amer: >60 mL/min (ref >60)
Glucose, Bld: 143 mg/dL — ABNORMAL HIGH (ref 65–99)
Potassium: 4.6 mmol/L (ref 3.5–5.1)
Sodium: 138 mmol/L (ref 135–145)

## 2015-10-10 LAB — GLUCOSE, CAPILLARY
Glucose-Capillary: 102 mg/dL — ABNORMAL HIGH (ref 65–99)
Glucose-Capillary: 111 mg/dL — ABNORMAL HIGH (ref 65–99)
Glucose-Capillary: 120 mg/dL — ABNORMAL HIGH (ref 65–99)
Glucose-Capillary: 121 mg/dL — ABNORMAL HIGH (ref 65–99)
Glucose-Capillary: 130 mg/dL — ABNORMAL HIGH (ref 65–99)
Glucose-Capillary: 131 mg/dL — ABNORMAL HIGH (ref 65–99)
Glucose-Capillary: 131 mg/dL — ABNORMAL HIGH (ref 65–99)
Glucose-Capillary: 132 mg/dL — ABNORMAL HIGH (ref 65–99)
Glucose-Capillary: 136 mg/dL — ABNORMAL HIGH (ref 65–99)
Glucose-Capillary: 149 mg/dL — ABNORMAL HIGH (ref 65–99)

## 2015-10-10 MED ORDER — MOVING RIGHT ALONG BOOK
Freq: Once | Status: AC
  Administered 2015-10-10: 05:00:00
  Filled 2015-10-10: qty 1

## 2015-10-10 MED ORDER — INSULIN ASPART 100 UNIT/ML ~~LOC~~ SOLN
0.0000 [IU] | Freq: Three times a day (TID) | SUBCUTANEOUS | Status: DC
  Administered 2015-10-10 – 2015-10-11 (×3): 2 [IU] via SUBCUTANEOUS

## 2015-10-10 MED ORDER — SODIUM CHLORIDE 0.9% FLUSH: 3.0000 mL | INTRAVENOUS | Status: DC | PRN

## 2015-10-10 MED ORDER — POTASSIUM CHLORIDE 10 MEQ/50ML IV SOLN: 10.0000 meq | INTRAVENOUS | Status: DC | PRN

## 2015-10-10 MED ORDER — FUROSEMIDE 40 MG PO TABS
40.0000 mg | ORAL_TABLET | Freq: Every day | ORAL | Status: DC
  Administered 2015-10-11 – 2015-10-12 (×2): 40 mg via ORAL
  Filled 2015-10-10 (×2): qty 1

## 2015-10-10 MED ORDER — MOVING RIGHT ALONG BOOK
Freq: Once | Status: AC
  Filled 2015-10-10: qty 1

## 2015-10-10 MED ORDER — SODIUM CHLORIDE 0.9% FLUSH
3.0000 mL | Freq: Two times a day (BID) | INTRAVENOUS | Status: DC
  Administered 2015-10-10 – 2015-10-11 (×3): 3 mL via INTRAVENOUS

## 2015-10-10 MED ORDER — MAGNESIUM HYDROXIDE 400 MG/5ML PO SUSP
30.0000 mL | Freq: Every day | ORAL | Status: DC | PRN
  Administered 2015-10-10: 30 mL via ORAL
  Filled 2015-10-10: qty 30

## 2015-10-10 MED ORDER — SODIUM CHLORIDE 0.9 % IV SOLN: 250.0000 mL | INTRAVENOUS | Status: DC | PRN

## 2015-10-10 MED ORDER — POTASSIUM CHLORIDE CRYS ER 20 MEQ PO TBCR
20.0000 meq | EXTENDED_RELEASE_TABLET | Freq: Every day | ORAL | Status: DC
  Administered 2015-10-11: 20 meq via ORAL
  Filled 2015-10-10 (×2): qty 1

## 2015-10-10 MED ORDER — ALLOPURINOL 100 MG PO TABS
100.0000 mg | ORAL_TABLET | Freq: Every day | ORAL | Status: DC
  Administered 2015-10-10 – 2015-10-12 (×3): 100 mg via ORAL
  Filled 2015-10-10 (×3): qty 1

## 2015-10-10 MED ORDER — FUROSEMIDE 10 MG/ML IJ SOLN
20.0000 mg | Freq: Every day | INTRAMUSCULAR | Status: AC
  Administered 2015-10-10: 20 mg via INTRAVENOUS
  Filled 2015-10-10: qty 2

## 2015-10-10 NOTE — Progress Notes (Signed)
Pt arrived on the unit from 2S , assesment completed see flow sheet, pt oriented to room and staff, placed on cardiac monitor ccmd notified, call light within reach will continue to monitor.

## 2015-10-10 NOTE — Progress Notes (Signed)
2 Days Post-Op Procedure(s) (LRB): CORONARY ARTERY BYPASS GRAFTING time 4 using left internal mammary and right greater saphenous vein harvested by endovein (N/A) TRANSESOPHAGEAL ECHOCARDIOGRAM (TEE) (N/A) Subjective: Doing well after multivessel CABG No complaints Ambulating in hallway Maintaining sinus rhythm  Objective: Vital signs in last 24 hours: Temp:  [97.6 F (36.4 C)-98.6 F (37 C)] 97.8 F (36.6 C) (04/09 0851) Pulse Rate:  [68-95] 83 (04/09 1000) Cardiac Rhythm:  [-] Normal sinus rhythm (04/09 0700) Resp:  [10-28] 27 (04/09 1000) BP: (97-144)/(58-80) 131/68 mmHg (04/09 1000) SpO2:  [91 %-100 %] 96 % (04/09 1000) Arterial Line BP: (118-124)/(47-51) 118/47 mmHg (04/08 1200) Weight:  [220 lb 10.9 oz (100.1 kg)] 220 lb 10.9 oz (100.1 kg) (04/09 0600)  Hemodynamic parameters for last 24 hours:  stable  Intake/Output from previous day: 04/08 0701 - 04/09 0700 In: 1830 [P.O.:1200; I.V.:480; IV Piggyback:150] Out: 965 [Urine:925; Chest Tube:40] Intake/Output this shift: Total I/O In: 410 [P.O.:360; IV Piggyback:50] Out: 275 [Urine:275]       Exam    General- alert and comfortable   Lungs- clear without rales, wheezes   Cor- regular rate and rhythm, no murmur , gallop   Abdomen- soft, non-tender   Extremities - warm, non-tender, minimal edema   Neuro- oriented, appropriate, no focal weakness   Lab Results:  Recent Labs  10/09/15 1645 10/09/15 1654 10/10/15 0400  WBC 9.6  --  7.4  HGB 10.3* 10.9* 8.7*  HCT 31.7* 32.0* 26.9*  PLT 129*  --  99*   BMET:  Recent Labs  10/09/15 0410  10/09/15 1654 10/10/15 0400  NA 138  --  137 138  K 3.7  --  4.8 4.6  CL 106  --  102 105  CO2 23  --   --  25  GLUCOSE 136*  --  137* 143*  BUN 9  --  15 18  CREATININE 0.64  < > 0.80 0.94  CALCIUM 7.8*  --   --  8.1*  < > = values in this interval not displayed.  PT/INR:  Recent Labs  10/08/15 1414  LABPROT 17.2*  INR 1.40   ABG    Component Value Date/Time    PHART 7.351 10/08/2015 1845   HCO3 22.5 10/08/2015 1845   TCO2 22 10/09/2015 1654   ACIDBASEDEF 3.0* 10/08/2015 1845   O2SAT 96.0 10/08/2015 1845   CBG (last 3)   Recent Labs  10/10/15 0009 10/10/15 0351 10/10/15 0846  GLUCAP 131* 130* 136*    Assessment/Plan: S/P Procedure(s) (LRB): CORONARY ARTERY BYPASS GRAFTING time 4 using left internal mammary and right greater saphenous vein harvested by endovein (N/A) TRANSESOPHAGEAL ECHOCARDIOGRAM (TEE) (N/A) Mobilize Diuresis Plan for transfer to step-down: see transfer orders   LOS: 2 days    Tharon Aquas Trigt III 10/10/2015

## 2015-10-10 NOTE — Plan of Care (Signed)
Problem: Activity: Goal: Risk for activity intolerance will decrease Outcome: Progressing Pt able to ambulate without difficulty. OOB to chair with assistance.  Problem: Cardiac: Goal: Hemodynamic stability will improve Outcome: Completed/Met Date Met:  10/10/15 Pt vitals stable off pressors and epicardial pacemaker. Minimal CT drainage prior to removal per order.  Problem: Education: Goal: Knowledge of disease or condition will improve Outcome: Progressing Extensive discussions r/t CAD and sign and symptoms of ACS had with Pt. Pt progressed to Stage 1 of cardiac rehab per orders.  Problem: Nutritional: Goal: Risk for body nutrition deficit will decrease Outcome: Completed/Met Date Met:  10/10/15 Pt diet advanced to Heart healthy/ Carb mod. Pt consuminig good PO intake with meals.  Problem: Physical Regulation: Goal: Postoperative complications will be avoided or minimized Outcome: Progressing Pt explained precautions r/t to sternotomy. Able verbalize and demonstrate appropriate precautions.  Problem: Respiratory: Goal: Levels of oxygenation will improve Outcome: Progressing Pt able to Rapid wean off ventilator and progressively decreased oxygen need. Regularly demonstrates good pulmonary toilet and maintaining adequate O2 sats  Problem: Pain Management: Goal: Pain level will decrease Outcome: Progressing Pt demonstrating good pain control and appropriately requests analgesics as needed     

## 2015-10-10 NOTE — Progress Notes (Signed)
Report to 2 W RN 

## 2015-10-10 NOTE — Progress Notes (Signed)
Ambulated to 2W 09 with wheelchair, NT in room to receive. Pt placed in chair and on tele box. SCD's with pt.

## 2015-10-10 NOTE — Progress Notes (Signed)
Attempted to call report to 2W 

## 2015-10-11 ENCOUNTER — Inpatient Hospital Stay (HOSPITAL_COMMUNITY): Payer: PRIVATE HEALTH INSURANCE

## 2015-10-11 LAB — BASIC METABOLIC PANEL
Anion gap: 9 (ref 5–15)
BUN: 13 mg/dL (ref 6–20)
CO2: 25 mmol/L (ref 22–32)
Calcium: 8.2 mg/dL — ABNORMAL LOW (ref 8.9–10.3)
Chloride: 102 mmol/L (ref 101–111)
Creatinine, Ser: 0.82 mg/dL (ref 0.61–1.24)
GFR calc Af Amer: >60 mL/min (ref >60)
GFR calc non Af Amer: >60 mL/min (ref >60)
Glucose, Bld: 148 mg/dL — ABNORMAL HIGH (ref 65–99)
Potassium: 4.3 mmol/L (ref 3.5–5.1)
Sodium: 136 mmol/L (ref 135–145)

## 2015-10-11 LAB — CBC
HCT: 26.3 % — ABNORMAL LOW (ref 39.0–52.0)
Hemoglobin: 8.7 g/dL — ABNORMAL LOW (ref 13.0–17.0)
MCH: 29.8 pg (ref 26.0–34.0)
MCHC: 33.1 g/dL (ref 30.0–36.0)
MCV: 90.1 fL (ref 78.0–100.0)
Platelets: 116 10*3/uL — ABNORMAL LOW (ref 150–400)
RBC: 2.92 MIL/uL — ABNORMAL LOW (ref 4.22–5.81)
RDW: 12.8 % (ref 11.5–15.5)
WBC: 6.9 10*3/uL (ref 4.0–10.5)

## 2015-10-11 LAB — GLUCOSE, CAPILLARY
Glucose-Capillary: 160 mg/dL — ABNORMAL HIGH (ref 65–99)
Glucose-Capillary: 117 mg/dL — ABNORMAL HIGH (ref 65–99)

## 2015-10-11 MED FILL — Mannitol IV Soln 20%: INTRAVENOUS | Qty: 500 | Status: AC

## 2015-10-11 MED FILL — Heparin Sodium (Porcine) Inj 1000 Unit/ML: INTRAMUSCULAR | Qty: 10 | Status: AC

## 2015-10-11 MED FILL — Sodium Chloride IV Soln 0.9%: INTRAVENOUS | Qty: 1000 | Status: AC

## 2015-10-11 MED FILL — Lidocaine HCl IV Inj 20 MG/ML: INTRAVENOUS | Qty: 5 | Status: AC

## 2015-10-11 MED FILL — Electrolyte-R (PH 7.4) Solution: INTRAVENOUS | Qty: 3000 | Status: AC

## 2015-10-11 MED FILL — Sodium Bicarbonate IV Soln 8.4%: INTRAVENOUS | Qty: 50 | Status: AC

## 2015-10-11 NOTE — Progress Notes (Signed)
CARDIAC REHAB PHASE I   Up in hall independently with daughter   MODE:  Ambulation: 890 ft   POST:  Rate/Rhythm: 97 SR few PVCs  BP:  Supine:   Sitting:   Standing: 139/72   SaO2: 97%RA [id number] Pt walked 890 ft with daughter in hall with steady gait. Observed rhythm when up and few PVCs noted. Pt did state he is in a lot of pain when he moves but not when walking. Encouraged pt to take pain med as he stated he is hesitant to do IS because it hurts. He can get to about 1250 ml. He also is concerned as he has not had a BM since Thursday. Told pt to discuss with RN as they should be able to keep him more comfortable and address bowel issue. Education completed with daughter and pt who voiced understanding. Wrote down how to view discharge video. Gave written ex ed and discussed CRP 2. Will send letter of interest to [facility] but cannot write order for Dr Saralyn Pilar.   Graylon Good, RN BSN  10/11/2015 8:57 AM

## 2015-10-11 NOTE — Discharge Summary (Signed)
Physician Discharge Summary       [address]       [place] [address]             [phone]    Patient ID: Omar Christian MRN: [id number] DOB/AGE: [birth year] 60 y.o.  Admit date: 10/08/2015 Discharge date: 10/12/2015  Admission Diagnoses: 1. Progressive angina ([place]) 2. Multivessel CAD  Active Diagnoses:  1. Hypertension 2. Hyperlipidemia 3. GERD (gastroesophageal reflux disease) 4. Abnormal myocardial perfusion study 5. ABL anemia 6. Mild thrombocytopenia  Procedure (s):  Coronary artery bypass grafting x4 with the left internal mammary to the left anterior descending coronary artery, reverse saphenous vein graft to the diagonal coronary artery, reverse saphenous vein graft to the obtuse marginal coronary artery, and reverse saphenous vein graft to the posterior descending coronary artery arising from the distal circumflex with right greater saphenous thigh and calf Endo vein harvesting by Dr. Servando Christian on 10/08/2015.  History of Presenting Illness: Omar Christian 60 y.o. male is seen in the office as an elective referral from Dr. Saralyn Christian for coronary artery disease. Patient has had at least 6 months if not longer history of chest discomfort, indigestion sometimes with exertion and sometimes at rest. He notes that walking uphill or upstairs makes the symptoms worse and take several minutes of rest before they resolve. The patient has had no previous cardiac history. A nuclear stress test was performed in [place] on March 27 with EKG evidence of ischemia with exertion ejection fraction 44% apical wall hypokinesis moderate inferior and apical wall ischemia. Echocardiogram was performed at [facility]. clinic on March 24 demonstrating dilated aortic root dilated ascending aorta to 4.5 cm ejection fraction 55% mild mitral and aortic insufficiency. Cardiac catheterization was performed at [facility] on 09/30/2015. The right coronary artery was not identified, but appears  to have a left dominant system so likely the right coronary is small, patient has severe disease in the left system including proximal LAD first obtuse marginal and circumflex, there is evidence of a ascending aortic dilatation, but this is not fully evaluated.  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 Not known to be diabetic.  The goals risks and alternatives of the planned surgical procedure CABG have been discussed with the patient in detail.In addition to other potential risks and complications from the surgery, I have made the patient aware of the recent Drew concerning the risk of infection by Myocobacterium chimaera related to the use of Stockert 3T heater-cooler equipment during cardiac surgery. I discussed with the patient the low risk of infection, as well as our compliance with the most current FDA recommendations to minimize infection and testing of all devices for contamination. Pre operative carotid duplex US showed no significant internal carotid US showed bilaterally. He underwent a CABG x 4 on 10/08/2015.  Brief Hospital Course:  The patient was extubated the evening of surgery without difficulty. He remained afebrile and hemodynamically stable. He was weaned off of Neo Synephrine drip. Omar Christian, a line, chest tubes, and foley were removed early in the post operative course. Lopressor was started and titrated accordingly. He was volume over loaded and diuresed. He had ABL anemia. He did not require a post op transfusion. H and H stable at 8.7 and 26.3. He also had mild thrombocytopenia- Platelets up to 116,000. He was weaned off the insulin drip. The patient's HGA1C pre op was 5.6. The patient was felt surgically stable for transfer  from the ICU to PCTU for further convalescence on 10/10/2015. He continues to progress with cardiac rehab. He was ambulating on room air. He has been tolerating a diet and has  had a bowel movement. Epicardial pacing wires were removed on 04/10. Chest tube sutures will be removed prior to discharge. The patient is felt surgically stable for discharge today.   Latest Vital Signs: Blood pressure 118/61, pulse 77, temperature 98.3 F (36.8 C), temperature source Oral, resp. rate 20, height 6\' 1"  (1.854 m), weight 214 lb 11.2 oz (97.387 kg), SpO2 95 %.  Physical Exam: Cardiovascular: RRR Pulmonary: Slightly diminished at bases; no rales, wheezes, or rhonchi. Abdomen: Soft, non tender, bowel sounds present. Extremities: Mild bilateral lower extremity edema. Wounds:Sternal wound is clean and dry. No erythema or signs of infection. RLE wounds are clean and dry  Discharge Condition: Stable and discharged home.  Recent laboratory studies:  Lab Results  Component Value Date   WBC 6.9 10/11/2015   HGB 8.7* 10/11/2015   HCT 26.3* 10/11/2015   MCV 90.1 10/11/2015   PLT 116* 10/11/2015   Lab Results  Component Value Date   NA 136 10/11/2015   K 4.3 10/11/2015   CL 102 10/11/2015   CO2 25 10/11/2015   CREATININE 0.82 10/11/2015   GLUCOSE 148* 10/11/2015    Diagnostic Studies: Dg Chest 2 View  10/11/2015  CLINICAL DATA:  Status post CABG, shortness of breath EXAM: CHEST  2 VIEW COMPARISON:  Portable chest x-ray of October 10, 2015 FINDINGS: The lungs are adequately inflated. Bibasilar atelectasis has improved. Small bilateral pleural effusions persist. There is no pneumothorax or pneumomediastinum. The cardiac silhouette remains mildly enlarged. The pulmonary vascularity is normal. The sternal wires are intact. The trachea is midline. The right internal jugular venous catheter is been removed. IMPRESSION: Improving bibasilar atelectasis. Trace bilateral pleural effusions. There is no CHF. Electronically Signed   By: Omar Christian M.D.   On: 10/11/2015 07:43   Ct Angio Chest Aorta W/cm &/or Wo/cm  10/06/2015  CLINICAL DATA:  Rule out a thoracic aortic aneurysm. Scheduled  for open heart surgery. EXAM: CT ANGIOGRAPHY CHEST WITH CONTRAST TECHNIQUE: Multidetector CT imaging of the chest was performed using the standard protocol during bolus administration of intravenous contrast. Multiplanar CT image reconstructions and MIPs were obtained to evaluate the vascular anatomy. CONTRAST:  100 mL Isovue 370 COMPARISON:  Chest radiograph 10/06/2015 FINDINGS: Mediastinum/Lymph Nodes: The aortic root at the sinuses of Valsalva is enlarged, measuring up to 4.4 cm. The mid ascending thoracic aorta is enlarged measuring 4.4 cm. Proximal aortic arch measures up to 4 cm. There is typical aortic arch anatomy and the great vessels are patent. Descending thoracic aorta measures 2.7 cm. Negative for an aortic dissection. Calcifications involving the left coronary arteries. No evidence for a large or central pulmonary embolism. Normal appearance of the thyroid tissue. There is no evidence for chest lymphadenopathy. Small amount of fluid in pericardial recess. No gross abnormality to the esophagus. Lungs/Pleura: Trachea and mainstem bronchi are patent. 4 mm pleural-based nodule in the right lower lobe on sequence 11, image 49. Pleural-based nodule along the anterior right middle lobe on sequence 11, image 36, measures 3 mm on the sagittal reformats. 3 mm nodule in the right middle lobe on sequence 11, image 42. There are a few other punctate nodular densities in the right lower lung. No large areas of airspace disease or consolidation. There is volume loss or scarring in the lingula. No pleural effusions. Upper abdomen:  Proximal abdominal aorta has a normal caliber. The celiac trunk and splenic artery are very prominent. There is a round peripherally enhancing structure in the splenic hilum which is nonspecific and measures up to 1.3 cm. Musculoskeletal: No suspicious bone findings. Review of the MIP images confirms the above findings. 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  Ascending thoracic aorta measures up to 4.4 cm. Recommend annual imaging followup by CTA or MRA. This recommendation follows 2010 ACCF/AHA/AATS/ACR/ASA/SCA/SCAI/SIR/STS/SVM Guidelines for the Diagnosis and Management of Patients with Thoracic Aortic Disease. Circulation. 2010; 121: [id number] Few small nodular densities scattered throughout the lungs. Largest measures up to 4 mm. These small nodular densities are nonspecific. No follow-up needed if patient is low-risk (and has no known or suspected primary neoplasm). Non-contrast chest CT can be considered in 12 months if patient is high-risk. This recommendation follows the consensus statement: Guidelines for Management of Incidental Pulmonary Nodules Detected on CT Images:From the Fleischner Society 2017; published online before print (10.1148/radiol.SG:5268862). There is a 1.3 cm enhancing structure in the spleen near the hilum. This area is incompletely characterized due the arterial phase of contrast and unclear if this is a lesion or vascular malformation/structure. Recommend attention to this area on follow up imaging. Electronically Signed   By: Markus Daft M.D.   On: 10/06/2015 17:22   Discharge Medications:   Medication List    STOP taking these medications        lisinopril-hydrochlorothiazide 10-12.5 MG tablet  Commonly known as:  PRINZIDE,ZESTORETIC     nitroGLYCERIN 0.4 MG SL tablet  Commonly known as:  NITROSTAT     ranitidine 150 MG capsule  Commonly known as:  ZANTAC      TAKE these medications        allopurinol 100 MG tablet  Commonly known as:  ZYLOPRIM  Take 1 tablet (100 mg total) by mouth daily.     aspirin 325 MG EC tablet  Take 1 tablet (325 mg total) by mouth daily.     atorvastatin 20 MG tablet  Commonly known as:  LIPITOR  Take 1 tablet (20 mg total) by mouth 30 (thirty) minutes before procedure.     fluticasone 50 MCG/ACT nasal spray  Commonly known as:  FLONASE  INSTILL 2 SPRAYS INTO EACH NOSTRIL AT  BEDTIME     furosemide 40 MG tablet  Commonly known as:  LASIX  Take 1 tablet (40 mg total) by mouth daily. For 5 days then stop.     loratadine 10 MG tablet  Commonly known as:  CLARITIN  TAKE ONE TABLET BY MOUTH ONCE DAILY     METAMUCIL PO  Take 1 scoop by mouth daily as needed (for constipation).     metoprolol tartrate 25 MG tablet  Commonly known as:  LOPRESSOR  Take 0.5 tablets (12.5 mg total) by mouth 2 (two) times daily.     potassium chloride SA 20 MEQ tablet  Commonly known as:  K-DUR,KLOR-CON  Take 1 tablet (20 mEq total) by mouth daily. For 5 days then stop.     traMADol 50 MG tablet  Commonly known as:  ULTRAM  Take 1 tablet (50 mg total) by mouth every 4 (four) hours as needed for moderate pain.       The patient has been discharged on:   1.Beta Blocker:  Yes [ x  ]  No   [   ]                              If No, reason:  2.Ace Inhibitor/ARB: Yes [   ]                                     No  [  x  ]                                     If No, reason:Labile BP  3.Statin:   Yes [ x  ]                  No  [   ]                  If No, reason:  4.Ecasa:  Yes  [ x  ]                  No   [   ]                  If No, reason:  Follow Up Appointments: Follow-up Information    Follow up with [name], MD.   Specialty:  Cardiology   Why:  Call for a follow up appointment for 2 weeks   Contact information:   [facility] [place] [address] [phone]       Follow up with [name], MD On 11/11/2015.   Specialty:  Cardiothoracic Surgery   Why:  PA/LAT CXR to be taken (at [facility] which is in the same building as Dr. Everrett Coombe office) on 11/11/2015 at 1:45 pm;Appointment time is at 2:30 pm   Contact information:   [address] [place] [address] [phone]       Signed: Lars Pinks MPA-C 10/12/2015, 7:46 AM

## 2015-10-11 NOTE — Op Note (Deleted)
Omar Christian, Omar Christian NO.:  [id number]  MEDICAL RECORD NO.:  [id number]  LOCATION:                               FACILITY:  [facility]  PHYSICIAN:  Lanelle Bal, MD    DATE OF BIRTH:  09-Aug-1955  DATE OF PROCEDURE:  10/08/2015 DATE OF DISCHARGE:  10/12/2015                              OPERATIVE REPORT   PREOPERATIVE DIAGNOSIS:  Critical 3 vessel coronary occlusive disease with unstable angina.  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  SURGICAL PROCEDURE:  Coronary artery bypass grafting x4 with the left internal mammary to the left anterior descending coronary artery, reverse saphenous vein graft to the diagonal coronary artery, reverse saphenous vein graft to the obtuse marginal coronary artery, and reverse saphenous vein graft to the posterior descending coronary artery arising from the distal circumflex with right greater saphenous thigh and calf Endo vein harvesting.  SURGEON:  Lanelle Bal, MD.  FIRST ASSISTANT:  Ellwood Handler, PA  BRIEF HISTORY:  The patient is a 60 year old male with little previous medical history who presents with several months of increasing anginal symptoms with exertion and occasionally at rest.  Because of these increasing symptoms, he was referred to Cardiology and underwent echocardiogram and cardiac catheterization at [facility].  At the time of catheterization, he was found to have complex 3-vessel disease with 80%- 90% stenoses of the LAD, diagonal, and circumflex.  There was no injection of the right.  The posterior descending fills off the distal circumflex.  Overall, ejection fraction was preserved.  The patient was noted to have mildly dilated aorta.  Prior to surgery, a CTA of the chest was performed confirming the aortic size of 4.2 cm.  Risks and options were discussed with the patient in detail and coronary artery bypass grafting was recommended to him.  He  agreed and signed informed consent.  DESCRIPTION OF PROCEDURE:  With Swan-Ganz and arterial line monitors placed, the patient underwent general endotracheal anesthesia without incidence.  Skin of the chest and legs was prepped with Betadine and draped in usual sterile manner.  Using the [employer] Endo vein harvesting system, segment of vein was harvested from the right thigh and calf, removing the right greater saphenous vein.  The vein was of excellent quality.  Median sternotomy was performed.  The left internal mammary artery was dissected down as a pedicle graft.  The distal artery was divided, had good free flow.  Pericardium was opened.  As noted, the patient's aorta was mildly dilated, on external measurement was 4 to 4.2 cm.  TEE probe had been placed by Dr. Tobias Alexander, and it showed trace mitral regurgitation and trace aortic regurgitation.  With the patient's body size, 3 leaflet aortic valve, without significant mitral or aortic regurgitation, it was felt replacing the ascending aorta was not warranted.  The patient was systemically heparinized.  Ascending aorta was cannulated.  The right atrium was cannulated, and aortic root vent cardioplegia needle was introduced into the ascending aorta.  The patient was placed on cardiopulmonary bypass 2.4 L/min/m2.  Sites of anastomosis were selected and dissected out of the epicardium.  The patient's body temperature was cooled to 32  degrees.  Aortic crossclamp was applied and 700 mL of cold blood potassium cardioplegia was administered with diastolic arrest of the heart.  Myocardial septal temperature was monitored throughout the crossclamp.  Attention was turned first to the posterior descending coronary artery, which was opened, then was 1.3-1.4 mm in size.  Using a running 7-0 Prolene, distal anastomosis was performed with a second reverse saphenous vein graft.  Attention was then turned to the lateral aspect of the heart.  The obtuse  marginal vessel of the primary supplier of the lateral wall was opened and was of good quality, admitted a 1.5 mm probe, easily the vessel was 1.8 mm in size.  Using a running 7-0 Prolene, distal anastomosis was performed with a segment of reverse saphenous vein graft.  The 1st diagonal vessel was slightly smaller, but was opened and was 1.2-1.3 mm in size.  Using a running 7-0 Prolene, distal anastomosis was performed.  Attention was then turned to the left anterior descending coronary artery.  It was noted on the patient's cath films, the LAD was relatively small.  The vessel was opened and admitted a 1.5 mm probe proximally and a 1 mm probe distally.  Using a running 8-0 Prolene, left internal mammary artery was anastomosed to the left anterior descending coronary artery.  With release of the bulldog on the mammary artery, there was prompt rise in myocardial septal temperature.  The bulldog was placed back on the mammary artery.  Then, we proceeded with proximal anastomoses, each of the veins were trimmed to the appropriate length. Three punch aortotomies were performed and anastomosed to the ascending aorta.  The bulldog was removed from the mammary artery with rise in myocardial septal temperature.  The heart was allowed to passively fill and de-air.  An aortic cross-clamp was removed with total cross-clamp time of 94 minutes.  The patient spontaneously converted to a sinus rhythm.  Sites of anastomosis were inspected free of bleeding.  The patient was then ventilated and weaned from cardiopulmonary bypass without difficulty.  He remained hemodynamically stable.  He was decannulated in the usual fashion.  Protamine sulfate was administered with operative field hemostatic.  Atrial and ventricular pacing wires were applied.  Graft markers were applied.  The left pleural tube and a Blake mediastinal drain were left in place.  Pericardium was loosely reapproximated.  Sternum was closed  with #6 stainless steel wire. Fascia was closed with interrupted 0 Vicryl, running 3-0 Vicryl subcutaneous tissue, 4-0 subcuticular stitch in skin edges.  Dry dressings were applied.  Sponge and needle count was reported as correct at completion of procedure.  The patient tolerated the procedure without obvious complication and was transferred to the Surgical Intensive care Unit for further postoperative care.  He did not require any blood products during the operative procedure.  Total pump time was 126 minutes.     Lanelle Bal, MD     EG/MEDQ  D:  10/11/2015  T:  10/11/2015  Job:  [id number]  cc:   [name], MD

## 2015-10-11 NOTE — Discharge Instructions (Signed)
Coronary Artery Bypass Grafting, Care After °Refer to this sheet in the next few weeks. These instructions provide you with information on caring for yourself after your procedure. Your health care provider may also give you more specific instructions. Your treatment has been planned according to current medical practices, but problems sometimes occur. Call your health care provider if you have any problems or questions after your procedure. °WHAT TO EXPECT AFTER THE PROCEDURE °Recovery from surgery will be different for everyone. Some people feel well after 3 or 4 weeks, while for others it takes longer. After your procedure, it is typical to have the following: °· Nausea and a lack of appetite.   °· Constipation. °· Weakness and fatigue.   °· Depression or irritability.   °· Pain or discomfort at your incision site. °HOME CARE INSTRUCTIONS °· Take medicines only as directed by your health care provider. Do not stop taking medicines or start any new medicines without first checking with your health care provider. °· Take your pulse as directed by your health care provider. °· Perform deep breathing as directed by your health care provider. If you were given a device called an incentive spirometer, use it to practice deep breathing several times a day. Support your chest with a pillow or your arms when you take deep breaths or cough. °· Keep incision areas clean, dry, and protected. Remove or change any bandages (dressings) only as directed by your health care provider. You may have skin adhesive strips over the incision areas. Do not take the strips off. They will fall off on their own. °· Check incision areas daily for any swelling, redness, or drainage. °· If incisions were made in your legs, do the following: °¨ Avoid crossing your legs.   °¨ Avoid sitting for long periods of time. Change positions every 30 minutes.   °¨ Elevate your legs when you are sitting. °· Wear compression stockings as directed by your  health care provider. These stockings help keep blood clots from forming in your legs. °· Take showers once your health care provider approves. Until then, only take sponge baths. Pat incisions dry. Do not rub incisions with a washcloth or towel. Do not take baths, swim, or use a hot tub until your health care provider approves. °· Eat foods that are high in fiber, such as raw fruits and vegetables, whole grains, beans, and nuts. Meats should be lean cut. Avoid canned, processed, and fried foods. °· Drink enough fluid to keep your urine clear or pale yellow. °· Weigh yourself every day. This helps identify if you are retaining fluid that may make your heart and lungs work harder. °· Rest and limit activity as directed by your health care provider. You may be instructed to: °¨ Stop any activity at once if you have chest pain, shortness of breath, irregular heartbeats, or dizziness. Get help right away if you have any of these symptoms. °¨ Move around frequently for short periods or take short walks as directed by your health care provider. Increase your activities gradually. You may need physical therapy or cardiac rehabilitation to help strengthen your muscles and build your endurance. °¨ Avoid lifting, pushing, or pulling anything heavier than 10 lb (4.5 kg) for at least 6 weeks after surgery. °· Do not drive until your health care provider approves.  °· Ask your health care provider when you may return to work. °· Ask your health care provider when you may resume sexual activity. °· Keep all follow-up visits as directed by your health care   provider. This is important. °SEEK MEDICAL CARE IF: °· You have swelling, redness, increasing pain, or drainage at the site of an incision. °· You have a fever. °· You have swelling in your ankles or legs. °· You have pain in your legs.   °· You gain 2 or more pounds (0.9 kg) a day. °· You are nauseous or vomit. °· You have diarrhea.  °SEEK IMMEDIATE MEDICAL CARE IF: °· You have  chest pain that goes to your jaw or arms. °· You have shortness of breath.   °· You have a fast or irregular heartbeat.   °· You notice a "clicking" in your breastbone (sternum) when you move.   °· You have numbness or weakness in your arms or legs. °· You feel dizzy or light-headed.   °MAKE SURE YOU: °· Understand these instructions. °· Will watch your condition. °· Will get help right away if you are not doing well or get worse. °  °This information is not intended to replace advice given to you by your health care provider. Make sure you discuss any questions you have with your health care provider. °  °Document Released: 01/06/2005 Document Revised: 07/10/2014 Document Reviewed: 11/26/2012 °Elsevier Interactive Patient Education ©2016 [employer] ° °

## 2015-10-11 NOTE — Progress Notes (Addendum)
      [address]       [place] [address]             [phone]        3 Days Post-Op Procedure(s) (LRB): CORONARY ARTERY BYPASS GRAFTING time 4 using left internal mammary and right greater saphenous vein harvested by endovein (N/A) TRANSESOPHAGEAL ECHOCARDIOGRAM (TEE) (N/A)  Subjective: Patient had a bowel movement. He has no specific complaints.  Objective: Vital signs in last 24 hours: Temp:  [98.3 F (36.8 C)-99.7 F (37.6 C)] 99.7 F (37.6 C) (04/10 0958) Pulse Rate:  [80-94] 80 (04/10 0958) Cardiac Rhythm:  [-] Normal sinus rhythm (04/10 0810) Resp:  [18-27] 18 (04/10 0958) BP: (99-130)/(53-71) 116/63 mmHg (04/10 0958) SpO2:  [90 %-96 %] 90 % (04/10 0958) Weight:  [218 lb 14.7 oz (99.3 kg)] 218 lb 14.7 oz (99.3 kg) (04/10 0544)  Pre op weight 97.6 kg Current Weight  10/11/15 218 lb 14.7 oz (99.3 kg)      Intake/Output from previous day: 04/09 0701 - 04/10 0700 In: 650 [P.O.:600; IV Piggyback:50] Out: 600 [Urine:600]   Physical Exam:  Cardiovascular: RRR Pulmonary: Slightly diminished at bases; no rales, wheezes, or rhonchi. Abdomen: Soft, non tender, bowel sounds present. Extremities: Mild bilateral lower extremity edema. Wounds: Aquacel dressing removed. Sternal wound is mostly clean and dry.  No erythema or signs of infection. RLE wounds are clean and dry  Lab Results: CBC: Recent Labs  10/10/15 0400 10/11/15 0207  WBC 7.4 6.9  HGB 8.7* 8.7*  HCT 26.9* 26.3*  PLT 99* 116*   BMET:  Recent Labs  10/10/15 0400 10/11/15 0207  NA 138 136  K 4.6 4.3  CL 105 102  CO2 25 25  GLUCOSE 143* 148*  BUN 18 13  CREATININE 0.94 0.82  CALCIUM 8.1* 8.2*    PT/INR:  Lab Results  Component Value Date   INR 1.40 10/08/2015   INR 1.05 10/06/2015   ABG:  INR: Will add last result for INR, ABG once components are confirmed Will add last 4 CBG results once components are confirmed  Assessment/Plan:  1. CV - SR in the 80's.  On Lopressor 12.5 mg bid. 2.  Pulmonary - On room air. CXR shows no pneumothorax, improving bibasilar atelectasis, and small bilateral pleural effusions. Encourage incentive spirometer 3. Volume Overload - On Lasix 40 mg daily 4.  Acute blood loss anemia - H and H stable at 8.7 and 26.3 5. Mild thrombocytopenia-platelets up to 116,000 6. Remove EPW 7. CBGs 149/160/117. Pre op HGA1C 5.6. Stop accu checks and SS PRN. 8. Likely discharge in am   ZIMMERMAN,DONIELLE MPA-C 10/11/2015,12:50 PM  Plan d/c in am I have seen and examined [name] and agree with the above assessment  and plan.  [name] MD Beeper [phone] Office [phone]

## 2015-10-11 NOTE — Progress Notes (Signed)
Patient is doing very well. Denies any pain or discomfort. Slept through the night. Walked about 500 feet this morning without any discomfort. Vitals stable.

## 2015-10-11 NOTE — Op Note (Signed)
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  SURGICAL PROCEDURE:  Coronary artery bypass grafting x4 with the left internal mammary to the left anterior descending coronary artery, reverse saphenous vein graft to the diagonal coronary artery, reverse saphenous vein graft to the obtuse marginal coronary artery, and reverse saphenous vein graft to the posterior descending coronary artery arising from the distal circumflex with right greater saphenous thigh and calf Endo vein harvesting.  SURGEON:  Lanelle Bal, MD.  FIRST ASSISTANT:  Ellwood Handler, PA  BRIEF HISTORY:  The patient is a 60 year old male with little previous medical history who presents with several months of increasing anginal symptoms with exertion and occasionally at rest.  Because of these increasing symptoms, he was referred to Cardiology and underwent echocardiogram and cardiac catheterization at [facility].  At the time of catheterization, he was found to have complex 3-vessel disease with 80%- 90% stenoses of the LAD, diagonal, and circumflex.  There was no injection of the right.  The posterior descending fills off the distal circumflex.  Overall, ejection fraction was preserved.  The patient was noted to have mildly dilated aorta.  Prior to surgery, a CTA of the chest was performed confirming the aortic size of 4.2 cm.  Risks and options were discussed with the patient in detail and coronary artery bypass grafting was recommended to him.  He  agreed and signed informed consent.  DESCRIPTION OF PROCEDURE:  With Swan-Ganz and arterial line monitors placed, the patient underwent general endotracheal anesthesia without incidence.  Skin of the chest and legs was prepped with Betadine and draped in usual sterile manner.  Using the [employer] Endo vein harvesting system, segment of vein was harvested from the right thigh and calf, removing the right greater saphenous vein.  The vein was of excellent quality.  Median sternotomy was performed.  The left internal mammary artery was dissected down as a pedicle graft.  The distal artery was divided, had good free flow.  Pericardium was opened.  As noted, the patient's aorta was mildly dilated, on external measurement was 4 to 4.2 cm.  TEE probe had been placed by Dr. Tobias Alexander, and it showed trace mitral regurgitation and trace aortic regurgitation.  With the patient's body size, 3 leaflet aortic valve, without significant mitral or aortic regurgitation, it was felt replacing the ascending aorta was not warranted.  The patient was systemically heparinized.  Ascending aorta was cannulated.  The right atrium was cannulated, and aortic root vent cardioplegia needle was introduced into the ascending aorta.  The patient was placed on cardiopulmonary bypass 2.4 L/min/m2.  Sites of anastomosis were selected and dissected out of the epicardium.  The patient's body temperature was cooled to 32  degrees.  Aortic crossclamp was applied and 700 mL of cold blood potassium cardioplegia was administered with diastolic arrest of the heart.  Myocardial septal temperature was monitored throughout the crossclamp.  Attention was turned first to the posterior descending coronary artery, which was opened, then was 1.3-1.4 mm in size.  Using a running 7-0 Prolene, distal anastomosis was performed with a second reverse saphenous vein graft.  Attention was then turned to the lateral aspect of the heart.  The obtuse  marginal vessel of the primary supplier of the lateral wall was opened and was of good quality, admitted a 1.5 mm probe, easily the vessel was 1.8 mm in size.  Using a running 7-0 Prolene, distal anastomosis was performed with a segment of reverse saphenous vein graft.  The 1st diagonal vessel was slightly smaller, but was opened and was 1.2-1.3 mm in size.  Using a running 7-0 Prolene, distal anastomosis was performed.  Attention was then turned to the left anterior descending coronary artery.  It was noted on the patient's cath films, the LAD was relatively small.  The vessel was opened and admitted a 1.5 mm probe proximally and a 1 mm probe distally.  Using a running 8-0 Prolene, left internal mammary artery was anastomosed to the left anterior descending coronary artery.  With release of the bulldog on the mammary artery, there was prompt rise in myocardial septal temperature.  The bulldog was placed back on the mammary artery.  Then, we proceeded with proximal anastomoses, each of the veins were trimmed to the appropriate length. Three punch aortotomies were performed and anastomosed to the ascending aorta.  The bulldog was removed from the mammary artery with rise in myocardial septal temperature.  The heart was allowed to passively fill and de-air.  An aortic cross-clamp was removed with total cross-clamp time of 94 minutes.  The patient spontaneously converted to a sinus rhythm.  Sites of anastomosis were inspected free of bleeding.  The patient was then ventilated and weaned from cardiopulmonary bypass without difficulty.  He remained hemodynamically stable.  He was decannulated in the usual fashion.  Protamine sulfate was administered with operative field hemostatic.  Atrial and ventricular pacing wires were applied.  Graft markers were applied.  The left pleural tube and a Blake mediastinal drain were left in place.  Pericardium was loosely reapproximated.  Sternum was closed  with #6 stainless steel wire. Fascia was closed with interrupted 0 Vicryl, running 3-0 Vicryl subcutaneous tissue, 4-0 subcuticular stitch in skin edges.  Dry dressings were applied.  Sponge and needle count was reported as correct at completion of procedure.  The patient tolerated the procedure without obvious complication and was transferred to the Surgical Intensive care Unit for further postoperative care.  He did not require any blood products during the operative procedure.  Total pump time was 126 minutes.     Lanelle Bal, MD     EG/MEDQ  D:  10/11/2015  T:  10/11/2015  Job:  [id number]  cc:   [name], MD

## 2015-10-11 NOTE — Progress Notes (Signed)
Pt alert and oriented. No complaints of pain. Small spot of blood noted on bed under pt. Pt assessed, no open areas on back. Blood from leakage due to d/c of EPW. Pt has no needs at this time. Call bell and phone within reach. Will continue to monitor.

## 2015-10-11 NOTE — Progress Notes (Signed)
Betadine applied to pt chest incision as ordered by PA. Pt pacing wires removed per order and protocol. VSS; pt educated and informed of bedrest for 1hour with frequent VS; pt voices understanding and denies any questions. Pt wires removed with tips intact; right wire moved site started to ooze blood after removal; sterile dsg and pressure applied to site. Oozing stopped and site clean, dry and intact. Clean dry dsg applied to site. Pt in bed with call light within reach and family at bedside. Pt denies any pain or discomfort; Will closely monitor. Delia Heady RN

## 2015-10-12 ENCOUNTER — Encounter (HOSPITAL_COMMUNITY): Payer: Self-pay | Admitting: Cardiothoracic Surgery

## 2015-10-12 MED ORDER — TRAMADOL HCL 50 MG PO TABS: 50.0000 mg | ORAL_TABLET | ORAL | Status: DC | PRN

## 2015-10-12 MED ORDER — ASPIRIN 325 MG PO TBEC: 325.0000 mg | DELAYED_RELEASE_TABLET | Freq: Every day | ORAL | Status: DC

## 2015-10-12 MED ORDER — FUROSEMIDE 40 MG PO TABS: 40.0000 mg | ORAL_TABLET | Freq: Every day | ORAL | Status: DC

## 2015-10-12 MED ORDER — ALLOPURINOL 100 MG PO TABS: 100.0000 mg | ORAL_TABLET | Freq: Every day | ORAL | Status: DC

## 2015-10-12 MED ORDER — POTASSIUM CHLORIDE CRYS ER 20 MEQ PO TBCR: 20.0000 meq | EXTENDED_RELEASE_TABLET | Freq: Every day | ORAL | Status: DC

## 2015-10-12 NOTE — Progress Notes (Addendum)
      [address]       [place] [address]             [phone]        4 Days Post-Op Procedure(s) (LRB): CORONARY ARTERY BYPASS GRAFTING time 4 using left internal mammary and right greater saphenous vein harvested by endovein (N/A) TRANSESOPHAGEAL ECHOCARDIOGRAM (TEE) (N/A)  Subjective: Patient without complaints this am. He wants to go home.  Objective: Vital signs in last 24 hours: Temp:  [98.2 F (36.8 C)-99.8 F (37.7 C)] 98.3 F (36.8 C) (04/11 0223) Pulse Rate:  [77-93] 77 (04/11 0223) Cardiac Rhythm:  [-] Normal sinus rhythm (04/10 2000) Resp:  [18-21] 20 (04/11 0223) BP: (107-125)/(58-65) 118/61 mmHg (04/11 0223) SpO2:  [90 %-96 %] 95 % (04/11 0223) Weight:  [214 lb 11.2 oz (97.387 kg)] 214 lb 11.2 oz (97.387 kg) (04/11 0223)  Pre op weight 97.6 kg Current Weight  10/12/15 214 lb 11.2 oz (97.387 kg)      Intake/Output from previous day: 04/10 0701 - 04/11 0700 In: 480 [P.O.:480] Out: -    Physical Exam:  Cardiovascular: RRR Pulmonary: Slightly diminished at bases; no rales, wheezes, or rhonchi. Abdomen: Soft, non tender, bowel sounds present. Extremities: Mild bilateral lower extremity edema. Wounds:  Sternal wound is  clean and dry.  No erythema or signs of infection. RLE wounds are clean and dry  Lab Results: CBC:  Recent Labs  10/10/15 0400 10/11/15 0207  WBC 7.4 6.9  HGB 8.7* 8.7*  HCT 26.9* 26.3*  PLT 99* 116*   BMET:   Recent Labs  10/10/15 0400 10/11/15 0207  NA 138 136  K 4.6 4.3  CL 105 102  CO2 25 25  GLUCOSE 143* 148*  BUN 18 13  CREATININE 0.94 0.82  CALCIUM 8.1* 8.2*    PT/INR:  Lab Results  Component Value Date   INR 1.40 10/08/2015   INR 1.05 10/06/2015   ABG:  INR: Will add last result for INR, ABG once components are confirmed Will add last 4 CBG results once components are confirmed  Assessment/Plan:  1. CV - SR in the low 90's. On Lopressor 12.5 mg bid. 2.  Pulmonary - On room  air. Encourage incentive spirometer 3. Volume Overload - On Lasix 40 mg daily 4.  Acute blood loss anemia - H and H stable at 8.7 and 26.3 5. Mild thrombocytopenia-platelets up to 116,000 6. Discharge   ZIMMERMAN,DONIELLE MPA-C 10/12/2015,7:26 AM  Plan home today I have seen and examined Omar Christian and agree with the above assessment  and plan.  [name] MD Beeper [phone] Office [phone] 10/12/2015 7:37 AM

## 2015-10-12 NOTE — Progress Notes (Signed)
CT sutures removed per order and per protocol. Steri-strips applied.  Reviewed d/c instructions with Pt, daughter, and wife. No further questions at this time. Pt wife handed d/c instructions along with paper prescriptions.

## 2015-10-19 DIAGNOSIS — Z736 Limitation of activities due to disability: Secondary | ICD-10-CM

## 2015-11-11 ENCOUNTER — Ambulatory Visit: Payer: PRIVATE HEALTH INSURANCE | Admitting: Cardiothoracic Surgery

## 2015-11-12 ENCOUNTER — Other Ambulatory Visit: Payer: Self-pay | Admitting: Cardiothoracic Surgery

## 2015-11-12 DIAGNOSIS — Z951 Presence of aortocoronary bypass graft: Secondary | ICD-10-CM

## 2015-11-15 ENCOUNTER — Ambulatory Visit
Admission: RE | Admit: 2015-11-15 | Discharge: 2015-11-15 | Disposition: A | Discharge status: Home / Self Care | Payer: PRIVATE HEALTH INSURANCE | Source: Ambulatory Visit | Attending: Cardiothoracic Surgery | Admitting: Cardiothoracic Surgery

## 2015-11-15 ENCOUNTER — Ambulatory Visit (INDEPENDENT_AMBULATORY_CARE_PROVIDER_SITE_OTHER): Payer: Self-pay | Admitting: Surgical

## 2015-11-15 VITALS — BP 142/81 | HR 67 | Resp 16 | Wt 201.0 lb

## 2015-11-15 DIAGNOSIS — I25110 Atherosclerotic heart disease of native coronary artery with unstable angina pectoris: Secondary | ICD-10-CM

## 2015-11-15 DIAGNOSIS — Z951 Presence of aortocoronary bypass graft: Secondary | ICD-10-CM

## 2015-11-15 DIAGNOSIS — I2510 Atherosclerotic heart disease of native coronary artery without angina pectoris: Secondary | ICD-10-CM

## 2015-11-15 NOTE — Progress Notes (Signed)
Omar Christian [address]       Omar Christian,Omar Christian Omar Christian             Omar Christian                  Omar Christian Medical Record [id number] Date of Birth: [birth year]  Referring OR:[id number], Omar Coil, MD Primary Cardiology: Primary Care:Omar Christian, [place]  Chief Complaint:  Follow Up Visit DATE OF PROCEDURE: 10/08/2015 DATE OF DISCHARGE: 10/12/2015   OPERATIVE REPORT   PREOPERATIVE DIAGNOSIS: Critical 3 vessel coronary occlusive disease with unstable angina.  POSTOPERATIVE DIAGNOSIS: Critical 3 vessel coronary occlusive disease with unstable angina.  SURGICAL PROCEDURE: Coronary artery bypass grafting x4 with the left internal mammary to the left anterior descending coronary artery, reverse saphenous vein graft to the diagonal coronary artery, reverse saphenous vein graft to the obtuse marginal coronary artery, and reverse saphenous vein graft to the posterior descending coronary artery arising from the distal circumflex with right greater saphenous thigh and calf Endo vein harvesting.  SURGEON: Omar Bal, MD.  FIRST ASSISTANT: Omar Handler, PA  History of Present Illness:    Patient is a 60 year old male status post above described procedure seen in the office on today's date for routine follow-up. Overall he is doing quite well. He has no specific complaints. He is not using any pain medication. He denies chest pain or shortness of breath. He is ambulating without significant difficulties and tolerating routine activities. He denies fevers, chills or other constitutional symptoms. His weight has been stable. His blood pressure has been stable.         Zubrod Score: At the time of surgery this patient's most appropriate activity status/level should be described as: [x]     0    Normal activity, no symptoms []     1    Restricted in physical strenuous activity but ambulatory, able to do out light work []      2    Ambulatory and capable of self care, unable to do work activities, up and about                 >50 % of waking hours                                                                                   []     3    Only limited self care, in bed greater than 50% of waking hours []     4    Completely disabled, no self care, confined to bed or chair []     5    Moribund  History  Smoking status  . Never Smoker   Smokeless tobacco  . Never Used       No Known Allergies  Current Outpatient Prescriptions  Medication Sig Dispense Refill  . allopurinol (ZYLOPRIM) 100 MG tablet Take 1 tablet (100 mg total) by mouth daily. 30 tablet 3  . aspirin EC 325 MG EC tablet Take 1 tablet (325 mg total) by mouth daily. 30 tablet 0  . atorvastatin (LIPITOR) 20 MG tablet Take 1 tablet (20 mg total) by mouth 30 (thirty) minutes before procedure. (Patient taking differently:  Take 20 mg by mouth daily at 6 PM. ) 30 tablet 1  . metoprolol tartrate (LOPRESSOR) 25 MG tablet Take 0.5 tablets (12.5 mg total) by mouth 2 (two) times daily. 60 tablet 2  . Psyllium (METAMUCIL PO) Take 1 scoop by mouth daily as needed (for constipation).     . fluticasone (FLONASE) 50 MCG/ACT nasal spray Reported on 11/15/2015  3  . loratadine (CLARITIN) 10 MG tablet TAKE ONE TABLET BY MOUTH ONCE DAILY (Patient not taking: Reported on 11/15/2015) 30 tablet 3   No current facility-administered medications for this visit.       Physical Exam: BP 142/81 mmHg  Pulse 67  Resp 16  Wt 201 lb (91.173 kg)  General appearance: alert, cooperative and no distress Heart: regular rate and rhythm Lungs: clear to auscultation bilaterally Abdomen: Benign exam Extremities: No edema Wounds: Incisions all healing well without evidence of infection.  Diagnostic Studies & Laboratory data:         Recent Radiology Findings: Dg Chest 2 View  11/15/2015  CLINICAL DATA:  CABG on October 08, 2015; currently asymptomatic. EXAM: CHEST  2 VIEW  COMPARISON:  PA and lateral chest x-ray of October 11, 2015 FINDINGS: The lungs are adequately inflated. The bilateral pleural effusions have nearly totally resolved. The heart is top-normal in size. The pulmonary vascularity is normal. The sternal wires are intact. The retrosternal soft tissues appear normal. The thoracic vertebral bodies are preserved in height where visualized. IMPRESSION: Near total clearing of the bilateral pleural effusions. No CHF nor pneumonia. Electronically Signed   By: Omar  Christian M.D.   On: 11/15/2015 12:19      I have independently reviewed the above radiology findings and reviewed findings  with the patient.  Recent Labs: Lab Results  Component Value Date   WBC 6.9 10/11/2015   HGB 8.7* 10/11/2015   HCT 26.3* 10/11/2015   PLT 116* 10/11/2015   GLUCOSE 148* 10/11/2015   CHOL 206* 08/27/2015   TRIG 134 08/27/2015   HDL 45 08/27/2015   LDLCALC 134* 08/27/2015   ALT 20 10/06/2015   AST 21 10/06/2015   NA 136 10/11/2015   K 4.3 10/11/2015   CL 102 10/11/2015   CREATININE 0.82 10/11/2015   BUN 13 10/11/2015   CO2 25 10/11/2015   TSH 0.973 08/27/2015   INR 1.40 10/08/2015   HGBA1C 5.6 10/06/2015      Assessment / Plan:  Doing well status post CABG. He does not plan to do cardiac rehabilitation a formal sense but is active in plans to work out at the gym using a treadmill for walking. He has not been seen yet by cardiology . He will make an appointment. We discussed activity progression as well as driving. We will see him again on a when necessary basis for any surgically related issues or at request.         GOLD,Omar Christian 11/15/2015 1:45 PM

## 2015-11-15 NOTE — Patient Instructions (Signed)
Verbally discussed activity progression including lifting restrictions and driving.

## 2016-02-16 ENCOUNTER — Other Ambulatory Visit: Payer: Self-pay | Admitting: Physician Assistant

## 2016-03-07 ENCOUNTER — Other Ambulatory Visit: Payer: Self-pay | Admitting: Physician Assistant

## 2016-03-10 ENCOUNTER — Other Ambulatory Visit: Payer: Self-pay | Admitting: Physician Assistant

## 2016-03-16 ENCOUNTER — Other Ambulatory Visit: Payer: Self-pay | Admitting: Family Medicine

## 2016-03-16 DIAGNOSIS — Z8739 Personal history of other diseases of the musculoskeletal system and connective tissue: Secondary | ICD-10-CM

## 2016-03-16 MED ORDER — ALLOPURINOL 100 MG PO TABS: 100.0000 mg | ORAL_TABLET | Freq: Every day | ORAL | 3 refills | Status: DC

## 2016-03-16 NOTE — Progress Notes (Signed)
Ran out of the Allopurinol and requests refill. Will be getting blood tests in October 2017 at [place] [facility]. No recent gout attack.

## 2016-07-10 ENCOUNTER — Other Ambulatory Visit: Payer: Self-pay | Admitting: Family Medicine

## 2016-08-14 ENCOUNTER — Ambulatory Visit (INDEPENDENT_AMBULATORY_CARE_PROVIDER_SITE_OTHER): Payer: PRIVATE HEALTH INSURANCE | Admitting: Family Medicine

## 2016-08-14 ENCOUNTER — Encounter: Payer: Self-pay | Admitting: Family Medicine

## 2016-08-14 VITALS — BP 150/92 | HR 86 | Temp 100.0°F | Resp 18 | Wt 235.2 lb

## 2016-08-14 DIAGNOSIS — J101 Influenza due to other identified influenza virus with other respiratory manifestations: Secondary | ICD-10-CM

## 2016-08-14 DIAGNOSIS — Z951 Presence of aortocoronary bypass graft: Secondary | ICD-10-CM | POA: Diagnosis not present

## 2016-08-14 MED ORDER — AMOXICILLIN 875 MG PO TABS: 875.0000 mg | ORAL_TABLET | Freq: Two times a day (BID) | ORAL | 0 refills | Status: DC

## 2016-08-14 NOTE — Progress Notes (Signed)
Patient: Omar Christian Male    DOB: 1956-04-06   61 y.o.   MRN: [id number] Visit Date: 08/14/2016  Today's Provider: Vernie Murders, PA   Chief Complaint  Patient presents with  . Influenza  . Follow-up   Subjective:    Influenza  Episode onset: Wednesday. The problem occurs constantly. The problem has been unchanged. Associated symptoms include chills, congestion, coughing and a fever. Treatments tried: Tamiflu, Tessalon Pearles, Mucinex. The treatment provided no relief.  Patient was seen at [facility] on 08/11/2016 and tested positive for Influenza A.   Past Medical History:  Diagnosis Date  . Abnormal findings on cardiac catheterization 09/30/15   [facility]  . Abnormal myocardial perfusion study 09/24/15   [facility]  . CAD, multiple vessel 09/30/15   per CATH @ [facility]/DR.PARACHOS  . Coronary artery disease   . GERD (gastroesophageal reflux disease)   . Gout   . H/O echocardiogram 09/24/15   [place]  . Hard of hearing   . History of pneumonia   . Hyperlipidemia   . Hypertension   . Progressive angina [facility])    Past Surgical History:  Procedure Laterality Date  . CARDIAC CATHETERIZATION N/A 09/30/2015   Procedure: Left Heart Cath and Coronary Angiography;  Surgeon: [name], MD;  Location: [place] CV LAB;  Service: Cardiovascular;  Laterality: N/A;  . CORONARY ARTERY BYPASS GRAFT N/A 10/08/2015   Procedure: CORONARY ARTERY BYPASS GRAFTING time 4 using left internal mammary and right greater saphenous vein harvested by endovein;  Surgeon: [name], MD;  Location: [place];  Service: Open Heart Surgery;  Laterality: N/A;  . TEE WITHOUT CARDIOVERSION N/A 10/08/2015   Procedure: TRANSESOPHAGEAL ECHOCARDIOGRAM (TEE);  Surgeon: [name], MD;  Location: [place];  Service: Open Heart Surgery;  Laterality: N/A;  . WISDOM TOOTH EXTRACTION     Family History  Problem Relation Age of Onset  . Heart disease Mother   . Alcohol abuse Father   .  Throat cancer Father   . Muscular dystrophy Brother   . Muscular dystrophy Brother   . Hypertension Mother    No Known Allergies   Previous Medications   ALLOPURINOL (ZYLOPRIM) 100 MG TABLET    TAKE 1 TABLET (100 MG TOTAL) BY MOUTH DAILY.   ASPIRIN EC 325 MG EC TABLET    Take 1 tablet (325 mg total) by mouth daily.   ATORVASTATIN (LIPITOR) 20 MG TABLET    Take 1 tablet (20 mg total) by mouth 30 (thirty) minutes before procedure.   FLUTICASONE (FLONASE) 50 MCG/ACT NASAL SPRAY    Reported on 11/15/2015   LORATADINE (CLARITIN) 10 MG TABLET    TAKE ONE TABLET BY MOUTH ONCE DAILY   METOPROLOL TARTRATE (LOPRESSOR) 25 MG TABLET    Take 0.5 tablets (12.5 mg total) by mouth 2 (two) times daily.   PSYLLIUM (METAMUCIL PO)    Take 1 scoop by mouth daily as needed (for constipation).     Review of Systems  Constitutional: Positive for chills and fever.  HENT: Positive for congestion and sinus pain.   Respiratory: Positive for cough.   Cardiovascular: Negative.     Social History  Substance Use Topics  . Smoking status: Never Smoker  . Smokeless tobacco: Never Used  . Alcohol use No   Objective:   BP (!) 150/92 (BP Location: Right Arm, Patient Position: Sitting, Cuff Size: Normal)   Pulse 86   Temp 100 F (37.8 C) (Oral)   Resp 18  Wt 235 lb 3.2 oz (106.7 kg)   SpO2 95%   BMI 31.03 kg/m   Physical Exam  Constitutional: He is oriented to person, place, and time. He appears well-developed and well-nourished.  HENT:  Head: Normocephalic.  Right Ear: External ear normal.  Left Ear: External ear normal.  Nose: Nose normal.  Mouth/Throat: Oropharynx is clear and moist.  Slightly red posterior pharynx without exudates.  Eyes: Conjunctivae are normal.  Neck: Neck supple.  Cardiovascular: Normal rate and regular rhythm.   Pulmonary/Chest: Effort normal and breath sounds normal. He has no wheezes. He has no rales.  Abdominal: Soft. Bowel sounds are normal.  Musculoskeletal: Normal  range of motion.  Lymphadenopathy:    He has no cervical adenopathy.  Neurological: He is oriented to person, place, and time.      Assessment & Plan:     1. Influenza A Diagnosed with influenza A on 08-11-16. Taking Tamiflu BID but still having cough, fever, chills and rhinorrhea. Worried about possible pneumonia with his history of CAD S/P CABG 4 vessels. Will give antibiotic and advised to finish all the Tamiflu. Continue Tessalon Perles prn cough with Mucinex-DM. May use Tylenol or Advil prn fever and headaches. Must increase fluid intake and recheck prn. Home to rest and out of work 5 days. - amoxicillin (AMOXIL) 875 MG tablet; Take 1 tablet (875 mg total) by mouth 2 (two) times daily.  Dispense: 20 tablet; Refill: 0  2. 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 No chest pains or palpitations.

## 2016-11-02 ENCOUNTER — Other Ambulatory Visit: Payer: Self-pay | Admitting: Family Medicine

## 2017-01-08 ENCOUNTER — Ambulatory Visit
Admission: RE | Admit: 2017-01-08 | Discharge: 2017-01-08 | Disposition: A | Discharge status: Home / Self Care | Payer: PRIVATE HEALTH INSURANCE | Source: Ambulatory Visit | Attending: Family Medicine | Admitting: Family Medicine

## 2017-01-08 ENCOUNTER — Encounter: Payer: Self-pay | Admitting: Family Medicine

## 2017-01-08 ENCOUNTER — Telehealth: Payer: Self-pay | Admitting: Family Medicine

## 2017-01-08 ENCOUNTER — Ambulatory Visit (INDEPENDENT_AMBULATORY_CARE_PROVIDER_SITE_OTHER): Payer: PRIVATE HEALTH INSURANCE | Admitting: Family Medicine

## 2017-01-08 ENCOUNTER — Other Ambulatory Visit: Payer: Self-pay

## 2017-01-08 VITALS — BP 148/78 | HR 89 | Temp 98.4°F | Wt 244.8 lb

## 2017-01-08 DIAGNOSIS — R1084 Generalized abdominal pain: Secondary | ICD-10-CM

## 2017-01-08 DIAGNOSIS — R93422 Abnormal radiologic findings on diagnostic imaging of left kidney: Secondary | ICD-10-CM | POA: Diagnosis not present

## 2017-01-08 DIAGNOSIS — R140 Abdominal distension (gaseous): Secondary | ICD-10-CM | POA: Diagnosis not present

## 2017-01-08 DIAGNOSIS — R109 Unspecified abdominal pain: Secondary | ICD-10-CM | POA: Diagnosis present

## 2017-01-08 DIAGNOSIS — K5900 Constipation, unspecified: Secondary | ICD-10-CM | POA: Diagnosis not present

## 2017-01-08 NOTE — Telephone Encounter (Signed)
Pt just left the office with constipation and was  Told to go home and use a fleet enema.  Pt 's wife said he did the enema 45 minutes ago with no response yet.  Please advise.  asap  [phone]  Con [place]

## 2017-01-08 NOTE — Patient Instructions (Signed)

## 2017-01-08 NOTE — Telephone Encounter (Signed)
X-rays of abdomen not reviewed by radiologist yet but some air-fluid levels suggesting early obstruction with large amount of stool. May use Glycerin suppository to see if it will stimulate BM in the next hour. If more pain or no BM, should go to ER for evaluation of acute abdominal pain. Wife agrees with this plan and will encourage him to go.

## 2017-01-08 NOTE — Progress Notes (Signed)
Patient: Omar Christian Male    DOB: 12-03-1955   61 y.o.   MRN: [id number] Visit Date: 01/08/2017  Today's Provider: Vernie Murders, PA   Chief Complaint  Patient presents with  . Abdominal Pain  . Constipation   Subjective:    Abdominal Pain  This is a new problem. Episode onset: Saturday. The onset quality is sudden. The problem occurs intermittently. The pain is located in the generalized abdominal region. The pain is at a severity of 9/10. The quality of the pain is a sensation of fullness. Associated symptoms include constipation. The pain is aggravated by eating. Relieved by: going to sleep. Treatments tried: Gas X, Tums, Aspirin, Metamucil and Dulcolax  The treatment provided no relief. His past medical history is significant for GERD.   Past Medical History:  Diagnosis Date  . Abnormal findings on cardiac catheterization 09/30/15   [facility]  . Abnormal myocardial perfusion study 09/24/15   [facility]  . CAD, multiple vessel 09/30/15   per CATH @ [facility]/DR.PARACHOS  . Coronary artery disease   . GERD (gastroesophageal reflux disease)   . Gout   . H/O echocardiogram 09/24/15   [place]  . Hard of hearing   . History of pneumonia   . Hyperlipidemia   . Hypertension   . Progressive angina [facility])    Patient Active Problem List   Diagnosis Date Noted  . 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 Unstable angina pectoris ([place]) 10/04/2015  . Progressive angina ([place])   . Abnormal findings on cardiac catheterization 09/30/2015  . CAD, multiple vessel 09/30/2015  . Abnormal findings diagnostic imaging of heart and coronary circulation 09/30/2015  . CAD in native artery 09/30/2015  . H/O echocardiogram 09/24/2015  . Abnormal myocardial perfusion study 09/24/2015  . Chest pain 09/03/2015  . Allergic rhinitis 08/27/2015  . H/O: gout 08/27/2015  . HLD (hyperlipidemia) 08/27/2015  . Benign hypertension 08/27/2015  . Acid reflux 08/27/2015  . Essential (primary) hypertension 08/27/2015    . H/O disease 08/27/2015   Past Surgical History:  Procedure Laterality Date  . CARDIAC CATHETERIZATION N/A 09/30/2015   Procedure: Left Heart Cath and Coronary Angiography;  Surgeon: [name], MD;  Location: [place] CV LAB;  Service: Cardiovascular;  Laterality: N/A;  . CORONARY ARTERY BYPASS GRAFT N/A 10/08/2015   Procedure: CORONARY ARTERY BYPASS GRAFTING time 4 using left internal mammary and right greater saphenous vein harvested by endovein;  Surgeon: [name], MD;  Location: [place];  Service: Open Heart Surgery;  Laterality: N/A;  . TEE WITHOUT CARDIOVERSION N/A 10/08/2015   Procedure: TRANSESOPHAGEAL ECHOCARDIOGRAM (TEE);  Surgeon: [name], MD;  Location: [place];  Service: Open Heart Surgery;  Laterality: N/A;  . WISDOM TOOTH EXTRACTION     Family History  Problem Relation Age of Onset  . Heart disease Mother   . Alcohol abuse Father   . Throat cancer Father   . Muscular dystrophy Brother   . Muscular dystrophy Brother   . Hypertension Mother    No Known Allergies  Previous Medications   ALLOPURINOL (ZYLOPRIM) 100 MG TABLET    TAKE 1 TABLET (100 MG TOTAL) BY MOUTH DAILY.   ASPIRIN EC 325 MG EC TABLET    Take 1 tablet (325 mg total) by mouth daily.   ATORVASTATIN (LIPITOR) 40 MG TABLET    TAKE 1 TABLET (40 MG TOTAL) BY MOUTH ONCE DAILY.   FLUTICASONE (FLONASE) 50 MCG/ACT NASAL SPRAY    Reported on 11/15/2015  LORATADINE (CLARITIN) 10 MG TABLET    TAKE ONE TABLET BY MOUTH ONCE DAILY   METOPROLOL TARTRATE (LOPRESSOR) 25 MG TABLET    Take 0.5 tablets (12.5 mg total) by mouth 2 (two) times daily.   PSYLLIUM (METAMUCIL PO)    Take 1 scoop by mouth daily as needed (for constipation).     Review of Systems  Constitutional: Negative.   Respiratory: Negative.   Cardiovascular: Negative.   Gastrointestinal: Positive for abdominal pain and constipation.    Social History  Substance Use Topics  . Smoking status: Never Smoker  . Smokeless tobacco:  Never Used  . Alcohol use No   Objective:   BP (!) 148/78 (BP Location: Right Arm, Patient Position: Sitting, Cuff Size: Large)   Pulse 89   Temp 98.4 F (36.9 C) (Oral)   Wt 244 lb 12.8 oz (111 kg)   SpO2 94%   BMI 32.30 kg/m   Physical Exam  Constitutional: He is oriented to person, place, and time. He appears well-developed and well-nourished.  HENT:  Head: Normocephalic.  Eyes: Pupils are equal, round, and reactive to light.  Cardiovascular: Normal rate and regular rhythm.   Pulmonary/Chest: Effort normal and breath sounds normal.  Abdominal: He exhibits distension. There is tenderness.  Quiet bowel sounds with some generalized tenderness and tympany to percussion.  Neurological: He is alert and oriented to person, place, and time.  Skin:  Some sweating.      Assessment & Plan:     1. Generalized abdominal pain Onset over the past 2 days. Very small stool once today after taking Dulcolax yesterday. Generalized bloated sensation and decreased appetite. Bowel sounds quite and slight tympany to percussion. Will get abdominal x-ray, may use Glycerine suppository or Fleet's Enema today. If pain worsens and no response to this regimen, may need to go to ER. - DG Abd 2 Views  2. Constipation, unspecified constipation type Usually controlled by Metamucil once a day. No stool in the past 2 days. Vomited once yesterday after eating some french fries. Will get x-ray to rule out obstruction.

## 2017-01-09 ENCOUNTER — Encounter: Payer: Self-pay | Admitting: Emergency Medicine

## 2017-01-09 ENCOUNTER — Telehealth: Payer: Self-pay | Admitting: Family Medicine

## 2017-01-09 ENCOUNTER — Emergency Department
Admission: EM | Admit: 2017-01-09 | Discharge: 2017-01-09 | Disposition: A | Discharge status: Home / Self Care | Payer: PRIVATE HEALTH INSURANCE | Attending: Emergency Medicine | Admitting: Emergency Medicine

## 2017-01-09 ENCOUNTER — Emergency Department: Payer: PRIVATE HEALTH INSURANCE

## 2017-01-09 DIAGNOSIS — Z79899 Other long term (current) drug therapy: Secondary | ICD-10-CM | POA: Insufficient documentation

## 2017-01-09 DIAGNOSIS — I10 Essential (primary) hypertension: Secondary | ICD-10-CM | POA: Diagnosis not present

## 2017-01-09 DIAGNOSIS — N200 Calculus of kidney: Secondary | ICD-10-CM | POA: Insufficient documentation

## 2017-01-09 DIAGNOSIS — K5900 Constipation, unspecified: Secondary | ICD-10-CM | POA: Insufficient documentation

## 2017-01-09 DIAGNOSIS — Z7982 Long term (current) use of aspirin: Secondary | ICD-10-CM | POA: Insufficient documentation

## 2017-01-09 DIAGNOSIS — R109 Unspecified abdominal pain: Secondary | ICD-10-CM | POA: Diagnosis present

## 2017-01-09 LAB — COMPREHENSIVE METABOLIC PANEL
ALT: 22 U/L (ref 17–63)
AST: 22 U/L (ref 15–41)
Albumin: 4 g/dL (ref 3.5–5.0)
Alkaline Phosphatase: 63 U/L (ref 38–126)
Anion gap: 10 (ref 5–15)
BUN: 16 mg/dL (ref 6–20)
CO2: 28 mmol/L (ref 22–32)
Calcium: 8.9 mg/dL (ref 8.9–10.3)
Chloride: 99 mmol/L — ABNORMAL LOW (ref 101–111)
Creatinine, Ser: 1.26 mg/dL — ABNORMAL HIGH (ref 0.61–1.24)
GFR calc Af Amer: >60 mL/min (ref >60)
GFR calc non Af Amer: >60 mL/min (ref >60)
Glucose, Bld: 134 mg/dL — ABNORMAL HIGH (ref 65–99)
Potassium: 3.7 mmol/L (ref 3.5–5.1)
Sodium: 137 mmol/L (ref 135–145)
Total Bilirubin: 2 mg/dL — ABNORMAL HIGH (ref 0.3–1.2)
Total Protein: 7.2 g/dL (ref 6.5–8.1)

## 2017-01-09 LAB — URINALYSIS, COMPLETE (UACMP) WITH MICROSCOPIC
Bacteria, UA: NONE SEEN
Bilirubin Urine: NEGATIVE
Glucose, UA: NEGATIVE mg/dL
Ketones, ur: NEGATIVE mg/dL
Leukocytes, UA: NEGATIVE
Nitrite: NEGATIVE
Protein, ur: 30 mg/dL — AB
Specific Gravity, Urine: 1.017 (ref 1.005–1.030)
Squamous Epithelial / LPF: NONE SEEN
pH: 7 (ref 5.0–8.0)

## 2017-01-09 LAB — CBC
HCT: 39.3 % — ABNORMAL LOW (ref 40.0–52.0)
Hemoglobin: 13.9 g/dL (ref 13.0–18.0)
MCH: 31.1 pg (ref 26.0–34.0)
MCHC: 35.4 g/dL (ref 32.0–36.0)
MCV: 87.9 fL (ref 80.0–100.0)
Platelets: 127 10*3/uL — ABNORMAL LOW (ref 150–440)
RBC: 4.48 MIL/uL (ref 4.40–5.90)
RDW: 14 % (ref 11.5–14.5)
WBC: 9.9 10*3/uL (ref 3.8–10.6)

## 2017-01-09 LAB — LACTIC ACID, PLASMA: Lactic Acid, Venous: 1.6 mmol/L (ref 0.5–1.9)

## 2017-01-09 LAB — LIPASE, BLOOD: Lipase: 28 U/L (ref 11–51)

## 2017-01-09 LAB — TROPONIN I
Troponin I: 0.03 ng/mL (ref <0.03)
Troponin I: 0.03 ng/mL (ref <0.03)

## 2017-01-09 MED ORDER — SODIUM CHLORIDE 0.9 % IV BOLUS (SEPSIS)
1000.0000 mL | Freq: Once | INTRAVENOUS | Status: AC
  Administered 2017-01-09: 1000 mL via INTRAVENOUS

## 2017-01-09 MED ORDER — TAMSULOSIN HCL 0.4 MG PO CAPS: 0.4000 mg | ORAL_CAPSULE | Freq: Every day | ORAL | 0 refills | Status: DC

## 2017-01-09 MED ORDER — HYDROCODONE-ACETAMINOPHEN 5-325 MG PO TABS: 1.0000 | ORAL_TABLET | Freq: Four times a day (QID) | ORAL | 0 refills | Status: DC | PRN

## 2017-01-09 MED ORDER — IBUPROFEN 600 MG PO TABS: 600.0000 mg | ORAL_TABLET | Freq: Three times a day (TID) | ORAL | 0 refills | Status: DC | PRN

## 2017-01-09 MED ORDER — ONDANSETRON HCL 4 MG/2ML IJ SOLN
4.0000 mg | Freq: Once | INTRAMUSCULAR | Status: AC
  Administered 2017-01-09: 4 mg via INTRAVENOUS
  Filled 2017-01-09: qty 2

## 2017-01-09 MED ORDER — IOPAMIDOL (ISOVUE-300) INJECTION 61%
100.0000 mL | Freq: Once | INTRAVENOUS | Status: AC | PRN
  Administered 2017-01-09: 100 mL via INTRAVENOUS
  Filled 2017-01-09: qty 100

## 2017-01-09 MED ORDER — FENTANYL CITRATE (PF) 100 MCG/2ML IJ SOLN
75.0000 ug | Freq: Once | INTRAMUSCULAR | Status: AC
  Administered 2017-01-09: 75 ug via INTRAVENOUS

## 2017-01-09 MED ORDER — FENTANYL CITRATE (PF) 100 MCG/2ML IJ SOLN
INTRAMUSCULAR | Status: AC
  Administered 2017-01-09: 75 ug via INTRAVENOUS
  Filled 2017-01-09: qty 2

## 2017-01-09 MED ORDER — PEG 3350-KCL-NABCB-NACL-NASULF 236 G PO SOLR: 4000.0000 mL | Freq: Once | ORAL | 0 refills | Status: AC

## 2017-01-09 NOTE — Telephone Encounter (Signed)
Pt's wife called for xray results.  Please call asap if we have results.  [phone]  thanks teri

## 2017-01-09 NOTE — ED Notes (Signed)
Date and time results received: 01/09/17 1500 (use smartphrase ".now" to insert current time)  Test: Troponin Critical Value: 0.03  Name of Provider Notified: Dr. Mable Paris  Orders Received? Or Actions Taken?: No new orders.

## 2017-01-09 NOTE — ED Provider Notes (Signed)
[facility] Emergency Department Provider Note  ____________________________________________   First MD Initiated Contact with Patient 01/09/17 1513     (approximate)  I have reviewed the triage vital signs and the nursing notes.   HISTORY  Chief Complaint Abdominal Pain    HPI Omar Christian is a 61 y.o. male who self presents to the emergency department with 3 days of diffuse abdominal discomfort. His pain is moderate to severe and constant. Nothing seems to make it better or worse. He had a hard bowel movement this morning but normally has 2 loose stools a day. He is concerned more about the constipation that he is about the pain. He somewhat nauseated but has not vomited. He says he has not passed flatus in over 24 hours. He has no history of abdominal surgeries. He feels his abdomen is distended.Yesterday he went to his primary care physician who recommended he trial over-the-counter enemas which have not helped.   Past Medical History:  Diagnosis Date  . Abnormal findings on cardiac catheterization 09/30/15   [facility]  . Abnormal myocardial perfusion study 09/24/15   [facility]  . CAD, multiple vessel 09/30/15   per CATH @ [facility]/DR.PARACHOS  . Coronary artery disease   . GERD (gastroesophageal reflux disease)   . Gout   . H/O echocardiogram 09/24/15   Omar Christian  . Hard of hearing   . History of pneumonia   . Hyperlipidemia   . Hypertension   . Progressive angina [facility])     Patient Active Problem List   Diagnosis Date Noted  . 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 Unstable angina pectoris ([place]) 10/04/2015  . Progressive angina ([place])   . Abnormal findings on cardiac catheterization 09/30/2015  . CAD, multiple vessel 09/30/2015  . Abnormal findings diagnostic imaging of heart and coronary circulation 09/30/2015  . CAD in native artery 09/30/2015  . H/O echocardiogram 09/24/2015  . Abnormal myocardial perfusion study 09/24/2015  . Chest pain  09/03/2015  . Allergic rhinitis 08/27/2015  . H/O: gout 08/27/2015  . HLD (hyperlipidemia) 08/27/2015  . Benign hypertension 08/27/2015  . Acid reflux 08/27/2015  . Essential (primary) hypertension 08/27/2015  . H/O disease 08/27/2015    Past Surgical History:  Procedure Laterality Date  . CARDIAC CATHETERIZATION N/A 09/30/2015   Procedure: Left Heart Cath and Coronary Angiography;  Surgeon: [name], MD;  Location: [place] CV LAB;  Service: Cardiovascular;  Laterality: N/A;  . CORONARY ARTERY BYPASS GRAFT N/A 10/08/2015   Procedure: CORONARY ARTERY BYPASS GRAFTING time 4 using left internal mammary and right greater saphenous vein harvested by endovein;  Surgeon: [name], MD;  Location: [place];  Service: Open Heart Surgery;  Laterality: N/A;  . TEE WITHOUT CARDIOVERSION N/A 10/08/2015   Procedure: TRANSESOPHAGEAL ECHOCARDIOGRAM (TEE);  Surgeon: [name], MD;  Location: [place];  Service: Open Heart Surgery;  Laterality: N/A;  . WISDOM TOOTH EXTRACTION      Prior to Admission medications   Medication Sig Start Date End Date Taking? Authorizing Provider  allopurinol (ZYLOPRIM) 100 MG tablet TAKE 1 TABLET (100 MG TOTAL) BY MOUTH DAILY. 11/02/16   Chrismon, Vickki Muff, PA  aspirin EC 325 MG EC tablet Take 1 tablet (325 mg total) by mouth daily. 10/12/15   Omar Skillern, PA-C  atorvastatin (LIPITOR) 40 MG tablet TAKE 1 TABLET (40 MG TOTAL) BY MOUTH ONCE DAILY. 12/24/16   [provider]  fluticasone Omar Christian) 50 MCG/ACT nasal spray Reported on 11/15/2015 08/06/15  [provider]  HYDROcodone-acetaminophen (NORCO) 5-325 MG tablet Take 1 tablet by mouth every 6 (six) hours as needed for severe pain. 01/09/17   Omar Hong, MD  ibuprofen (ADVIL,MOTRIN) 600 MG tablet Take 1 tablet (600 mg total) by mouth every 8 (eight) hours as needed. 01/09/17   Omar Hong, MD  loratadine (CLARITIN) 10 MG tablet TAKE ONE TABLET BY MOUTH ONCE DAILY Patient  not taking: Reported on 01/08/2017 08/05/15   Chrismon, Vickki Muff, PA  metoprolol tartrate (LOPRESSOR) 25 MG tablet Take 0.5 tablets (12.5 mg total) by mouth 2 (two) times daily. 10/01/15   [name], MD  polyethylene glycol (GOLYTELY) 236 g solution Take 4,000 mLs by mouth once. 01/09/17 01/09/17  Omar Hong, MD  Psyllium (METAMUCIL PO) Take 1 scoop by mouth daily as needed (for constipation).     [provider]  tamsulosin (FLOMAX) 0.4 MG CAPS capsule Take 1 capsule (0.4 mg total) by mouth daily. 01/09/17   Omar Hong, MD    Allergies Patient has no known allergies.  Family History  Problem Relation Age of Onset  . Heart disease Mother   . Hypertension Mother   . Alcohol abuse Father   . Throat cancer Father   . Muscular dystrophy Brother   . Muscular dystrophy Brother     Social History Social History  Substance Use Topics  . Smoking status: Never Smoker  . Smokeless tobacco: Never Used  . Alcohol use No    Review of Systems Constitutional: No fever/chills Eyes: No visual changes. ENT: No sore throat. Cardiovascular: Denies chest pain. Respiratory: Denies shortness of breath. Gastrointestinal: Positive abdominal pain.  No nausea, no vomiting.  No diarrhea.  Positive constipation. Genitourinary: Negative for dysuria. Musculoskeletal: Negative for back pain. Skin: Negative for rash. Neurological: Negative for headaches, focal weakness or numbness.   ____________________________________________   PHYSICAL EXAM:  VITAL SIGNS: ED Triage Vitals  Enc Vitals Group     BP 01/09/17 1358 (!) 165/83     Pulse Rate 01/09/17 1358 80     Resp 01/09/17 1358 18     Temp 01/09/17 1358 98.2 F (36.8 C)     Temp Source 01/09/17 1358 Oral     SpO2 01/09/17 1358 94 %     Weight 01/09/17 1359 244 lb (110.7 kg)     Height 01/09/17 1359 6\' 1"  (1.854 m)     Head Circumference --      Peak Flow --      Pain Score 01/09/17 1358 8     Pain Loc --      Pain Edu?  --      Excl. in [place]? --     Constitutional: Alert and oriented 4 pleasant cooperative speaks in full clear sentences Eyes: PERRL EOMI. Head: Atraumatic. Nose: No congestion/rhinnorhea. Mouth/Throat: No trismus Neck: No stridor.   Cardiovascular: Normal rate, regular rhythm. Grossly normal heart sounds.  Good peripheral circulation. Respiratory: Normal respiratory effort.  No retractions. Lungs CTAB and moving good air Gastrointestinal: Distended abdomen diffusely mildly tender but no rebound or guarding no peritonitis no focality Musculoskeletal: No lower extremity edema   Neurologic:  Normal speech and language. No gross focal neurologic deficits are appreciated. Skin:  Skin is warm, dry and intact. No rash noted. Psychiatric: Mood and affect are normal. Speech and behavior are normal.    ____________________________________________   DIFFERENTIAL includes but not limited to  Small bowel obstruction, large bowel obstruction, volvulus, renal colic, pyelonephritis ____________________________________________   LABS (all  labs ordered are listed, but only abnormal results are displayed)  Labs Reviewed  COMPREHENSIVE METABOLIC PANEL - Abnormal; Notable for the following:       Result Value   Chloride 99 (*)    Glucose, Bld 134 (*)    Creatinine, Ser 1.26 (*)    Total Bilirubin 2.0 (*)    All other components within normal limits  CBC - Abnormal; Notable for the following:    HCT 39.3 (*)    Platelets 127 (*)    All other components within normal limits  URINALYSIS, COMPLETE (UACMP) WITH MICROSCOPIC - Abnormal; Notable for the following:    Color, Urine YELLOW (*)    APPearance CLEAR (*)    Hgb urine dipstick MODERATE (*)    Protein, ur 30 (*)    All other components within normal limits  TROPONIN I - Abnormal; Notable for the following:    Troponin I 0.03 (*)    All other components within normal limits  TROPONIN I - Abnormal; Notable for the following:    Troponin I  0.03 (*)    All other components within normal limits  LIPASE, BLOOD  LACTIC ACID, PLASMA  LACTIC ACID, PLASMA    Slightly elevated troponin but stable and no suggestion of ischemia __________________________________________  EKG  ED ECG REPORT I, Omar Christian, the attending physician, personally viewed and interpreted this ECG.  Date: 01/09/2017 Rate: 77 Rhythm: normal sinus rhythm QRS Axis: normal Intervals: normal ST/T Wave abnormalities: normal Narrative Interpretation: unremarkable aside from slight LVH  ____________________________________________  RADIOLOGY  Left-sided 5 mm kidney stone ____________________________________________   PROCEDURES  Procedure(s) performed: no  Procedures  Critical Care performed: no  Observation: no ____________________________________________   INITIAL IMPRESSION / ASSESSMENT AND PLAN / ED COURSE  Pertinent labs & imaging results that were available during my care of the patient were reviewed by me and considered in my medical decision making (see chart for details).  The patient arrives extremely uncomfortable appearing with a tympanitic and distended abdomen. His story of obstipation is concerning for small bowel obstruction. Fluids and CT scan abdomen and pelvis as well as IV fentanyl are all pending. Regarding his slightly elevated troponin this is nonspecific and his EKG is nonischemic. This could easily be secondary to demand and not primary coronary ischemia.    Fortunately the patient's second troponin is unchanged. His CT scan does not show any obstruction but does confirm a distal 5 mm left-sided kidney stone. I will refer him to urology as an outpatient. The patient is primarily concerned about his constipation. I've advised him to increase the amount of water he takes and also to trial MiraLAX and magnesium citrate. He is asking for prescription for something stronger case that does not work. However an enema  prescription for GoLYTELY however he understands only to use this if other more conservative measures do not work. Regardless he is discharged home in improved condition. ____________________________________________   FINAL CLINICAL IMPRESSION(S) / ED DIAGNOSES  Final diagnoses:  Kidney stone  Constipation, unspecified constipation type      NEW MEDICATIONS STARTED DURING THIS VISIT:  New Prescriptions   HYDROCODONE-ACETAMINOPHEN (NORCO) 5-325 MG TABLET    Take 1 tablet by mouth every 6 (six) hours as needed for severe pain.   IBUPROFEN (ADVIL,MOTRIN) 600 MG TABLET    Take 1 tablet (600 mg total) by mouth every 8 (eight) hours as needed.   POLYETHYLENE GLYCOL (GOLYTELY) 236 G SOLUTION    Take 4,000 mLs by  mouth once.   TAMSULOSIN (FLOMAX) 0.4 MG CAPS CAPSULE    Take 1 capsule (0.4 mg total) by mouth daily.     Note:  This document was prepared using Dragon voice recognition software and may include unintentional dictation errors.     Omar Hong, MD 01/09/17 [phone]

## 2017-01-09 NOTE — ED Triage Notes (Signed)
abd pain and bloating since Saturday.  Tried fleets and 2 glycerins suppositories at order of pcp seen yesterday.  Very little stool returned.  Continues to have pain.

## 2017-01-09 NOTE — ED Notes (Signed)
Signature pad not working.  [name], RN thoroughly reviewed discharge instructions and prescriptions and answered all of patient's questions.

## 2017-01-09 NOTE — Telephone Encounter (Signed)
Contacted patient's wife and advised her of message from yesterday when Simona Huh spoke with her and patient. Per [name] patient still has not had a BM and they will go to ER for evaluation per Dennis's recommendation.

## 2017-01-09 NOTE — Discharge Instructions (Signed)
It is normal for it to take up to 2 weeks for your kidney stone to pass. Please return to the emergency department if your pain worsens, if you develop fevers or chills, or for any other concerns whatsoever. Make an appointment to follow-up with urology as needed.  Regarding your constipation please make sure you increase the amount of water you drink every day and purchase over-the-counter magnesium citrate and MiraLAX to help.  It was a pleasure to take care of you today, and thank you for coming to our emergency department.  If you have any questions or concerns before leaving please ask the nurse to grab me and I'm more than happy to go through your aftercare instructions again.  If you were prescribed any opioid pain medication today such as Norco, Vicodin, Percocet, morphine, hydrocodone, or oxycodone please make sure you do not drive when you are taking this medication as it can alter your ability to drive safely.  If you have any concerns once you are home that you are not improving or are in fact getting worse before you can make it to your follow-up appointment, please do not hesitate to call 911 and come back for further evaluation.  Darel Hong MD  Results for orders placed or performed during the hospital encounter of 01/09/17  Lipase, blood  Result Value Ref Range   Lipase 28 11 - 51 U/L  Comprehensive metabolic panel  Result Value Ref Range   Sodium 137 135 - 145 mmol/L   Potassium 3.7 3.5 - 5.1 mmol/L   Chloride 99 (L) 101 - 111 mmol/L   CO2 28 22 - 32 mmol/L   Glucose, Bld 134 (H) 65 - 99 mg/dL   BUN 16 6 - 20 mg/dL   Creatinine, Ser 1.26 (H) 0.61 - 1.24 mg/dL   Calcium 8.9 8.9 - 10.3 mg/dL   Total Protein 7.2 6.5 - 8.1 g/dL   Albumin 4.0 3.5 - 5.0 g/dL   AST 22 15 - 41 U/L   ALT 22 17 - 63 U/L   Alkaline Phosphatase 63 38 - 126 U/L   Total Bilirubin 2.0 (H) 0.3 - 1.2 mg/dL   GFR calc non Af Amer >60 >60 mL/min   GFR calc Af Amer >60 >60 mL/min   Anion gap 10 5 -  15  CBC  Result Value Ref Range   WBC 9.9 3.8 - 10.6 K/uL   RBC 4.48 4.40 - 5.90 MIL/uL   Hemoglobin 13.9 13.0 - 18.0 g/dL   HCT 39.3 (L) 40.0 - 52.0 %   MCV 87.9 80.0 - 100.0 fL   MCH 31.1 26.0 - 34.0 pg   MCHC 35.4 32.0 - 36.0 g/dL   RDW 14.0 11.5 - 14.5 %   Platelets 127 (L) 150 - 440 K/uL  Urinalysis, Complete w Microscopic  Result Value Ref Range   Color, Urine YELLOW (A) YELLOW   APPearance CLEAR (A) CLEAR   Specific Gravity, Urine 1.017 1.005 - 1.030   pH 7.0 5.0 - 8.0   Glucose, UA NEGATIVE NEGATIVE mg/dL   Hgb urine dipstick MODERATE (A) NEGATIVE   Bilirubin Urine NEGATIVE NEGATIVE   Ketones, ur NEGATIVE NEGATIVE mg/dL   Protein, ur 30 (A) NEGATIVE mg/dL   Nitrite NEGATIVE NEGATIVE   Leukocytes, UA NEGATIVE NEGATIVE   RBC / HPF 6-30 0 - 5 RBC/hpf   WBC, UA 0-5 0 - 5 WBC/hpf   Bacteria, UA NONE SEEN NONE SEEN   Squamous Epithelial / LPF NONE SEEN  NONE SEEN  Troponin I  Result Value Ref Range   Troponin I 0.03 (HH) <0.03 ng/mL  Troponin I  Result Value Ref Range   Troponin I 0.03 (HH) <0.03 ng/mL  Lactic acid, plasma  Result Value Ref Range   Lactic Acid, Venous 1.6 0.5 - 1.9 mmol/L   Ct Abdomen Pelvis W Contrast  Result Date: 01/09/2017 CLINICAL DATA:  Abdominal pain and bloating since 01/06/2017. EXAM: CT ABDOMEN AND PELVIS WITH CONTRAST TECHNIQUE: Multidetector CT imaging of the abdomen and pelvis was performed using the standard protocol following bolus administration of intravenous contrast. CONTRAST:  100 ml ISOVUE-300 IOPAMIDOL (ISOVUE-300) INJECTION 61% COMPARISON:  None. FINDINGS: Lower chest: There is some dependent atelectasis or scar. No pleural or pericardial effusion. Heart size is normal. Calcific coronary artery disease is seen. No pleural or pericardial effusion. Hepatobiliary: A few small stones or sludge are seen in the gallbladder. No CT evidence of cholecystitis. The liver is diffusely low attenuating consistent with fatty infiltration. 0.5 cm  hypoattenuating lesion in the right hepatic lobe on image 20 is likely a hemangioma. The biliary tree is unremarkable. Pancreas: Unremarkable. No pancreatic ductal dilatation or surrounding inflammatory changes. Spleen: Normal in size without focal abnormality. Adrenals/Urinary Tract: There is moderate left hydronephrosis and delayed contrast excretion from the left kidney due to a 0.5 cm distal left ureteral stone. The stone is approximately 4.5 cm from the UVJ. The patient also has a 0.6 cm nonobstructing stone lower pole of the left kidney. The patient has 2 punctate nonobstructing stones in the midpole of the right kidney. The kidneys are otherwise unremarkable. The urinary bladder appears normal. Adrenal glands appear normal. Stomach/Bowel: The appendix is not visualized and may have been removed. No evidence of appendicitis is seen. The stomach and small and large bowel appear normal. Vascular/Lymphatic: Scattered aortic atherosclerotic calcifications. No aneurysm. No lymphadenopathy. Reproductive: Prostate is unremarkable. Other: No fluid collection. Trace amount of free fluid off the inferior aspect of the right hepatic lobe is noted. Musculoskeletal: No worrisome bony lesion. IMPRESSION: Moderate left hydronephrosis due to a 0.5 cm distal left ureteral stone. 0.6 cm nonobstructing stone lower pole left kidney. Two punctate nonobstructing stones right kidney also seen. Calcific aortic and coronary atherosclerosis. Fatty infiltration of the liver. Likely small gallbladder stones versus gallbladder sludge without cholecystitis. Electronically Signed   By: Inge Rise M.D.   On: 01/09/2017 16:40   Dg Abd 2 Views  Result Date: 01/08/2017 CLINICAL DATA:  Abdominal pain, bloating and constipation since Friday, pain is unbearable, history hypertension, GERD, coronary artery disease EXAM: ABDOMEN - 2 VIEW COMPARISON:  None FINDINGS: Scattered gas and stool throughout colon. Paucity of small bowel gas. No  bowel dilatation, bowel wall thickening or free air. Questionable nonobstructing 6 mm diameter LEFT renal calculus. No additional urinary tract calcifications. Bones appear demineralized. Bibasilar atelectasis. IMPRESSION: Nonspecific bowel gas pattern. Question nonobstructing 6 mm LEFT renal calculus. Electronically Signed   By: Lavonia Dana M.D.   On: 01/08/2017 16:49

## 2017-04-14 ENCOUNTER — Other Ambulatory Visit: Payer: Self-pay | Admitting: Family Medicine

## 2019-07-22 ENCOUNTER — Telehealth: Payer: Self-pay

## 2019-07-22 ENCOUNTER — Other Ambulatory Visit: Payer: Self-pay

## 2019-07-22 ENCOUNTER — Ambulatory Visit (INDEPENDENT_AMBULATORY_CARE_PROVIDER_SITE_OTHER): Payer: 59 | Admitting: Family Medicine

## 2019-07-22 ENCOUNTER — Encounter: Payer: Self-pay | Admitting: Family Medicine

## 2019-07-22 VITALS — BP 142/90 | HR 94 | Temp 97.1°F | Resp 16 | Wt 230.0 lb

## 2019-07-22 DIAGNOSIS — M79671 Pain in right foot: Secondary | ICD-10-CM | POA: Diagnosis not present

## 2019-07-22 DIAGNOSIS — E790 Hyperuricemia without signs of inflammatory arthritis and tophaceous disease: Secondary | ICD-10-CM | POA: Diagnosis not present

## 2019-07-22 MED ORDER — ALLOPURINOL 100 MG PO TABS
100.0000 mg | ORAL_TABLET | Freq: Every day | ORAL | 12 refills | Status: DC
Start: 2019-07-22 — End: 2020-07-31

## 2019-07-22 MED ORDER — INDOMETHACIN 50 MG PO CAPS: 50.0000 mg | ORAL_CAPSULE | Freq: Three times a day (TID) | ORAL | 1 refills | Status: DC

## 2019-07-22 NOTE — Telephone Encounter (Signed)
Copied from [place] [id number]. Topic: General - Other >> Jul 22, 2019  9:06 AM Rainey Pines A wrote: Patients wife would like a callback in regards to if patients bloodwork will be complete today and if he needs to be fasting. Patient would like callback as soon as possible before patients appt today.

## 2019-07-22 NOTE — Progress Notes (Signed)
Patient: Omar Christian Male    DOB: [birth year]   64 y.o.   MRN: [id number] Visit Date: 07/22/2019  Today's Provider: Lelon Huh, MD   Chief Complaint  Patient presents with  . Foot Pain   Subjective:     HPI Foot Pain: Patient complains for pain of the right foot for the past 2 months. Patient has a history of Gout. He has been out of Allopurinol for 2 years. He states the current is the same as his previous gout flares, however pain is along lateral aspect of right metatarsal, not in MTP or ankle joints. Has taken OTC ibuprofen which helps, no known injury  He also states he has been having congestion in his chest at night, but no fevers, cough, or dyspnea.    No Known Allergies   Current Outpatient Medications:  .  aspirin EC 325 MG EC tablet, Take 1 tablet (325 mg total) by mouth daily., Disp: 30 tablet, Rfl: 0 .  ibuprofen (ADVIL,MOTRIN) 600 MG tablet, Take 1 tablet (600 mg total) by mouth every 8 (eight) hours as needed., Disp: 30 tablet, Rfl: 0 .  allopurinol (ZYLOPRIM) 100 MG tablet, TAKE 1 TABLET (100 MG TOTAL) BY MOUTH DAILY. (Patient not taking: Reported on 07/22/2019), Disp: 30 tablet, Rfl: 3 .  atorvastatin (LIPITOR) 40 MG tablet, TAKE 1 TABLET (40 MG TOTAL) BY MOUTH ONCE DAILY., Disp: , Rfl: 5 .  fluticasone (FLONASE) 50 MCG/ACT nasal spray, Reported on 11/15/2015, Disp: , Rfl: 3 .  loratadine (CLARITIN) 10 MG tablet, TAKE ONE TABLET BY MOUTH ONCE DAILY (Patient not taking: Reported on 01/08/2017), Disp: 30 tablet, Rfl: 3 .  metoprolol tartrate (LOPRESSOR) 25 MG tablet, Take 0.5 tablets (12.5 mg total) by mouth 2 (two) times daily. (Patient not taking: Reported on 07/22/2019), Disp: 60 tablet, Rfl: 2 .  Psyllium (METAMUCIL PO), Take 1 scoop by mouth daily as needed (for constipation). , Disp: , Rfl:  .  tamsulosin (FLOMAX) 0.4 MG CAPS capsule, Take 1 capsule (0.4 mg total) by mouth daily. (Patient not taking: Reported on 07/22/2019), Disp: 14 capsule, Rfl:  0  Review of Systems  Constitutional: Negative for appetite change and fever. Chills: started 1 week ago.  HENT: Positive for congestion (throat congestion).   Respiratory: Negative for chest tightness, shortness of breath and wheezing.   Cardiovascular: Negative for chest pain and palpitations.  Gastrointestinal: Negative for abdominal pain, nausea and vomiting.  Musculoskeletal: Positive for arthralgias (right foot).    Social History   Tobacco Use  . Smoking status: Never Smoker  . Smokeless tobacco: Never Used  Substance Use Topics  . Alcohol use: No      Objective:   BP (!) 142/90 (BP Location: Right Arm, Cuff Size: Large)   Pulse 94   Temp (!) 97.1 F (36.2 C) (Temporal)   Resp 16   Wt 230 lb (104.3 kg)   SpO2 96% Comment: room air  BMI 30.34 kg/m  Vitals:   07/22/19 1400 07/22/19 1405  BP: (!) 142/88 (!) 142/90  Pulse: 94   Resp: 16   Temp: (!) 97.1 F (36.2 C)   TempSrc: Temporal   SpO2: 96%   Weight: 230 lb (104.3 kg)   Body mass index is 30.34 kg/m.   Physical Exam   General Appearance:    Mildly obese male in no acute distress  Eyes:    PERRL, conjunctiva/corneas clear, EOM's intact       Lungs:  Clear to auscultation bilaterally, respirations unlabored  Heart:    Normal heart rate. Normal rhythm. No murmurs, rubs, or gallops.   MS:   No joint swelling or tenderness of affected foot. Pea sized hard nodule lateral aspect of right foot at area he describes pain, but is not tender.   Neurologic:   Awake, alert, oriented x 3. No apparent focal neurological           defect.           Assessment & Plan    1. Foot pain, right Not consistent with gout. Recommend foot xray, he just got on insurance plan and doesn't have his card. Anticipate xray foot once he gets his insurance unless pain resolved for good.  - indomethacin (INDOCIN) 50 MG capsule; Take 1 capsule (50 mg total) by mouth 3 (three) times daily with meals. As needed for foot pain   Dispense: 30 capsule; Refill: 1  2. Elevated uric acid in blood Previously on 100mg  allopurinol which he would like to start back on.  - allopurinol (ZYLOPRIM) 100 MG tablet; Take 1 tablet (100 mg total) by mouth daily.  Dispense: 30 tablet; Refill: 12     Lelon Huh, MD  [facility]

## 2019-09-16 ENCOUNTER — Ambulatory Visit (INDEPENDENT_AMBULATORY_CARE_PROVIDER_SITE_OTHER): Payer: 59 | Admitting: Family Medicine

## 2019-09-16 ENCOUNTER — Other Ambulatory Visit: Payer: Self-pay

## 2019-09-16 ENCOUNTER — Encounter: Payer: Self-pay | Admitting: Family Medicine

## 2019-09-16 DIAGNOSIS — J302 Other seasonal allergic rhinitis: Secondary | ICD-10-CM

## 2019-09-16 DIAGNOSIS — E785 Hyperlipidemia, unspecified: Secondary | ICD-10-CM | POA: Diagnosis not present

## 2019-09-16 DIAGNOSIS — Z951 Presence of aortocoronary bypass graft: Secondary | ICD-10-CM

## 2019-09-16 DIAGNOSIS — I2510 Atherosclerotic heart disease of native coronary artery without angina pectoris: Secondary | ICD-10-CM

## 2019-09-16 DIAGNOSIS — J019 Acute sinusitis, unspecified: Secondary | ICD-10-CM | POA: Diagnosis not present

## 2019-09-16 DIAGNOSIS — Z8739 Personal history of other diseases of the musculoskeletal system and connective tissue: Secondary | ICD-10-CM

## 2019-09-16 MED ORDER — AMOXICILLIN 875 MG PO TABS
875.0000 mg | ORAL_TABLET | Freq: Two times a day (BID) | ORAL | 0 refills | Status: DC
Start: 2019-09-16 — End: 2020-01-27

## 2019-09-16 NOTE — Progress Notes (Signed)
Omar Christian  MRN: [id number] DOB: May 06, 1956  Subjective:  HPI   The patient is a 64 year old male who presented to the office for CPE.  He had health screening by phone and by front office staff and responded no to all questions.  Once he filled out the paperwork for his CPE he was noted to have multiple symptoms that resulted in having to send patient home and arrange for virtual visit.  Patient is being seen via phone visit.    Virtual Visit via Telephone Note  I connected with Omar Christian on 09/16/19 at  9:40 AM EDT by telephone and verified that I am speaking with the correct person using two identifiers.  Location: Patient: home Provider: office   I discussed the limitations, risks, security and privacy concerns of performing an evaluation and management service by telephone and the availability of in person appointments. I also discussed with the patient that there may be a patient responsible charge related to this service. The patient expressed understanding and agreed to proceed.   Patient Active Problem List   Diagnosis Date Noted  . 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 Unstable angina pectoris ([place]) 10/04/2015  . Progressive angina ([place])   . Abnormal findings on cardiac catheterization 09/30/2015  . CAD, multiple vessel 09/30/2015  . Abnormal findings diagnostic imaging of heart and coronary circulation 09/30/2015  . CAD in native artery 09/30/2015  . H/O echocardiogram 09/24/2015  . Abnormal myocardial perfusion study 09/24/2015  . Chest pain 09/03/2015  . Allergic rhinitis 08/27/2015  . H/O: gout 08/27/2015  . HLD (hyperlipidemia) 08/27/2015  . Benign hypertension 08/27/2015  . Acid reflux 08/27/2015  . Essential (primary) hypertension 08/27/2015  . H/O disease 08/27/2015    Past Medical History:  Diagnosis Date  . Abnormal findings on cardiac catheterization 09/30/15   [facility]  . Abnormal myocardial perfusion study 09/24/15   [facility]  . CAD, multiple  vessel 09/30/15   per CATH @ [facility]/DR.PARACHOS  . Coronary artery disease   . GERD (gastroesophageal reflux disease)   . Gout   . H/O echocardiogram 09/24/15   [place]  . Hard of hearing   . History of pneumonia   . Hyperlipidemia   . Hypertension   . Progressive angina [facility])    Past Surgical History:  Procedure Laterality Date  . CARDIAC CATHETERIZATION N/A 09/30/2015   Procedure: Left Heart Cath and Coronary Angiography;  Surgeon: [name], MD;  Location: [place] [facility];  Service: Cardiovascular;  Laterality: N/A;  . CORONARY ARTERY BYPASS GRAFT N/A 10/08/2015   Procedure: CORONARY ARTERY BYPASS GRAFTING time 4 using left internal mammary and right greater saphenous vein harvested by endovein;  Surgeon: [name], MD;  Location: [place];  Service: Open Heart Surgery;  Laterality: N/A;  . TEE WITHOUT CARDIOVERSION N/A 10/08/2015   Procedure: TRANSESOPHAGEAL ECHOCARDIOGRAM (TEE);  Surgeon: [name], MD;  Location: [place];  Service: Open Heart Surgery;  Laterality: N/A;  . WISDOM TOOTH EXTRACTION     Family History  Problem Relation Age of Onset  . Heart disease Mother   . Hypertension Mother   . Alcohol abuse Father   . Throat cancer Father   . Muscular dystrophy Brother   . Muscular dystrophy Brother     Social History   Socioeconomic History  . Marital status: Married    Spouse name: Not on file  . Number of children: Not on file  . Years of education: Not  on file  . Highest education level: Not on file  Occupational History  . Not on file  Tobacco Use  . Smoking status: Never Smoker  . Smokeless tobacco: Never Used  Substance and Sexual Activity  . Alcohol use: No  . Drug use: No  . Sexual activity: Not on file  Other Topics Concern  . Not on file  Social History Narrative  . Not on file   Social Determinants of Health   Financial Resource Strain:   . Difficulty of Paying Living Expenses:   Food Insecurity:   . Worried  About Charity fundraiser in the Last Year:   . Arboriculturist in the Last Year:   Transportation Needs:   . Film/video editor (Medical):   Marland Kitchen Lack of Transportation (Non-Medical):   Physical Activity:   . Days of Exercise per Week:   . Minutes of Exercise per Session:   Stress:   . Feeling of Stress :   Social Connections:   . Frequency of Communication with Friends and Family:   . Frequency of Social Gatherings with Friends and Family:   . Attends Religious Services:   . Active Member of Clubs or Organizations:   . Attends Archivist Meetings:   Marland Kitchen Marital Status:   Intimate Partner Violence:   . Fear of Current or Ex-Partner:   . Emotionally Abused:   Marland Kitchen Physically Abused:   . Sexually Abused:     Outpatient Encounter Medications as of 09/16/2019  Medication Sig Note  . allopurinol (ZYLOPRIM) 100 MG tablet Take 1 tablet (100 mg total) by mouth daily.   Marland Kitchen aspirin EC 325 MG EC tablet Take 1 tablet (325 mg total) by mouth daily.   Marland Kitchen atorvastatin (LIPITOR) 40 MG tablet TAKE 1 TABLET (40 MG TOTAL) BY MOUTH ONCE DAILY.   Marland Kitchen ibuprofen (ADVIL,MOTRIN) 600 MG tablet Take 1 tablet (600 mg total) by mouth every 8 (eight) hours as needed.   . indomethacin (INDOCIN) 50 MG capsule Take 1 capsule (50 mg total) by mouth 3 (three) times daily with meals. As needed for foot pain   . Psyllium (METAMUCIL PO) Take 1 scoop by mouth daily as needed (for constipation).    . fluticasone (FLONASE) 50 MCG/ACT nasal spray Reported on 11/15/2015 10/06/2015: On hold  . loratadine (CLARITIN) 10 MG tablet TAKE ONE TABLET BY MOUTH ONCE DAILY (Patient not taking: Reported on 01/08/2017)   . metoprolol tartrate (LOPRESSOR) 25 MG tablet Take 0.5 tablets (12.5 mg total) by mouth 2 (two) times daily. (Patient not taking: Reported on 07/22/2019)   . tamsulosin (FLOMAX) 0.4 MG CAPS capsule Take 1 capsule (0.4 mg total) by mouth daily. (Patient not taking: Reported on 07/22/2019)    No facility-administered  encounter medications on file as of 09/16/2019.    No Known Allergies  Review of Systems  HENT: Positive for congestion, ear pain, hearing loss, sore throat and tinnitus.        Dental problems, drooling and voice change  Respiratory: Positive for cough and wheezing.        Choking  Musculoskeletal:       Gait problems    Objective:  There were no vitals taken for this visit.  Physical Exam: No apparent acute respiratory distress or wheezing.  Assessment and Plan :   1. Subacute sinusitis, unspecified location Having head congestion with PND and sore throat over the past 5-6 weeks. No fever, cough, dyspnea, loss of taste or fatigue problems.  Convinced he has a sinus infection and "only Amoxicillin will help". Recommend antihistamine, nasal steroid, Mucinex and add Amoxil. If any COVID symptoms develop, should get COVID-19 test. Check CBC to assess for infection. - amoxicillin (AMOXIL) 875 MG tablet; Take 1 tablet (875 mg total) by mouth 2 (two) times daily.  Dispense: 20 tablet; Refill: 0 - CBC with Differential/Platelet; Future  2. Seasonal allergic rhinitis, unspecified trigger Having congestion, PND and slight sore throat the past 5-6 weeks. Has not been taking Claritin. Should continue Flonase and check CBC with diff. - CBC with Differential/Platelet; Future  3. Hyperlipidemia, unspecified hyperlipidemia type Poor compliance with pandemic restrictions and out of work the past year. Don't think he has been taking the Atorvastatin 40 mg qd. Need follow up labs and recheck pending reports. - CBC with Differential/Platelet; Future - Comprehensive metabolic panel; Future - Lipid panel; Future - TSH; Future  4. S/P CABG x 4 Had quadruple CABG on 10-08-15. Currently stable without angina. Dr. Saralyn Pilar (cardiologist) last evaluation was in 2018.  5. CAD in native artery No chest pains or dyspnea today. Denies COVID symptoms. Followed by Dr. Saralyn Pilar (cardiologist). Out of work and  no follow up in the past year or more. Will recheck labs and follow up pending reports. - CBC with Differential/Platelet; Future - Comprehensive metabolic panel; Future - Lipid panel; Future  6. H/O: gout Still taking Allopurinol 100 mg qd. No joint pains of significance today. Recheck uric acid level. - Uric acid; Future  I discussed the assessment and treatment plan with the patient. The patient was provided an opportunity to ask questions and all were answered. The patient agreed with the plan and demonstrated an understanding of the instructions.   The patient was advised to call back or seek an in-person evaluation if the symptoms worsen or if the condition fails to improve as anticipated.  I provided 30 minutes of non-face-to-face time during this encounter.

## 2019-09-19 ENCOUNTER — Other Ambulatory Visit: Payer: Self-pay

## 2019-09-19 DIAGNOSIS — I2510 Atherosclerotic heart disease of native coronary artery without angina pectoris: Secondary | ICD-10-CM

## 2019-09-19 DIAGNOSIS — Z8739 Personal history of other diseases of the musculoskeletal system and connective tissue: Secondary | ICD-10-CM

## 2019-09-19 DIAGNOSIS — J302 Other seasonal allergic rhinitis: Secondary | ICD-10-CM

## 2019-09-19 DIAGNOSIS — J019 Acute sinusitis, unspecified: Secondary | ICD-10-CM

## 2019-09-19 DIAGNOSIS — E785 Hyperlipidemia, unspecified: Secondary | ICD-10-CM

## 2019-09-20 LAB — CBC WITH DIFFERENTIAL/PLATELET
Basophils Absolute: 0 10*3/uL (ref 0.0–0.2)
Basos: 1 %
EOS (ABSOLUTE): 0.1 10*3/uL (ref 0.0–0.4)
Eos: 2 %
Hematocrit: 41.1 % (ref 37.5–51.0)
Hemoglobin: 14.1 g/dL (ref 13.0–17.7)
Immature Grans (Abs): 0 10*3/uL (ref 0.0–0.1)
Immature Granulocytes: 0 %
Lymphocytes Absolute: 1.4 10*3/uL (ref 0.7–3.1)
Lymphs: 25 %
MCH: 30.2 pg (ref 26.6–33.0)
MCHC: 34.3 g/dL (ref 31.5–35.7)
MCV: 88 fL (ref 79–97)
Monocytes Absolute: 0.3 10*3/uL (ref 0.1–0.9)
Monocytes: 6 %
Neutrophils Absolute: 3.8 10*3/uL (ref 1.4–7.0)
Neutrophils: 66 %
Platelets: 148 10*3/uL — ABNORMAL LOW (ref 150–450)
RBC: 4.67 x10E6/uL (ref 4.14–5.80)
RDW: 14 % (ref 11.6–15.4)
WBC: 5.6 10*3/uL (ref 3.4–10.8)

## 2019-09-20 LAB — COMPREHENSIVE METABOLIC PANEL
ALT: 29 IU/L (ref 0–44)
AST: 24 IU/L (ref 0–40)
Albumin/Globulin Ratio: 1.9 (ref 1.2–2.2)
Albumin: 4.3 g/dL (ref 3.8–4.8)
Alkaline Phosphatase: 80 IU/L (ref 39–117)
BUN/Creatinine Ratio: 11 (ref 10–24)
BUN: 9 mg/dL (ref 8–27)
Bilirubin Total: 0.6 mg/dL (ref 0.0–1.2)
CO2: 21 mmol/L (ref 20–29)
Calcium: 9.1 mg/dL (ref 8.6–10.2)
Chloride: 104 mmol/L (ref 96–106)
Creatinine, Ser: 0.82 mg/dL (ref 0.76–1.27)
GFR calc Af Amer: 110 mL/min/{1.73_m2} (ref >59)
GFR calc non Af Amer: 95 mL/min/{1.73_m2} (ref >59)
Globulin, Total: 2.3 g/dL (ref 1.5–4.5)
Glucose: 118 mg/dL — ABNORMAL HIGH (ref 65–99)
Potassium: 3.9 mmol/L (ref 3.5–5.2)
Sodium: 141 mmol/L (ref 134–144)
Total Protein: 6.6 g/dL (ref 6.0–8.5)

## 2019-09-20 LAB — LIPID PANEL
Chol/HDL Ratio: 5.6 ratio — ABNORMAL HIGH (ref 0.0–5.0)
Cholesterol, Total: 211 mg/dL — ABNORMAL HIGH (ref 100–199)
HDL: 38 mg/dL — ABNORMAL LOW (ref >39)
LDL Chol Calc (NIH): 122 mg/dL — ABNORMAL HIGH (ref 0–99)
Triglycerides: 289 mg/dL — ABNORMAL HIGH (ref 0–149)
VLDL Cholesterol Cal: 51 mg/dL — ABNORMAL HIGH (ref 5–40)

## 2019-09-20 LAB — URIC ACID: Uric Acid: 6.4 mg/dL (ref 3.8–8.4)

## 2019-09-20 LAB — TSH: TSH: 1.93 u[IU]/mL (ref 0.450–4.500)

## 2019-09-24 ENCOUNTER — Telehealth: Payer: Self-pay

## 2019-09-24 NOTE — Telephone Encounter (Addendum)
Can you sign off on his labs please? Thanks   Copied from [place] [id number]. Topic: General - Inquiry >> Sep 24, 2019  2:02 PM Richardo Priest, NT wrote: Reason for CRM: Patient's wife called in checking on status of lab work results. Please advise.

## 2019-09-25 MED ORDER — ATORVASTATIN CALCIUM 20 MG PO TABS: 20.0000 mg | ORAL_TABLET | Freq: Every day | ORAL | 3 refills | Status: DC

## 2019-09-25 NOTE — Telephone Encounter (Signed)
See result note.  

## 2019-09-25 NOTE — Telephone Encounter (Signed)
Pt and pt's wife given lab results and recommendationsper notes of Dennis,PA on 09/25/19. Pt verbalized understanding. Prescription for Atorvastatin 20 mg qd #90 and 3 refills sent to [facility] in [place] as requested. Pt would also like to know if he should be taking Metamucil. Pt denies any constipation at this time.  Patient stating that he is still having throat discomfort all the time and it is worse at night.  Patient's wife states that the patient only coughs with trying to get up phlegm. Denies fever or other symptoms at this time. Pt was seen via a virtual visit on 09/16/19 and was given a prescription for Amoxicillin. Pt has 2 more days left of current antibiotic.Pt states he has little improvement of symptoms at this time. Pt states that mainly in the morning he has a film around his mouth that is black and red in color. Pt states that this was also discussed during previous visit.  Pt and pt's wife voiced concerns that something is going on with the pt's throat and they are requesting an in office visit or referral to a throat specialist if needed.Pt can be contacted on home number or his wife's phone, [phone] with PCP recommendations.

## 2019-09-25 NOTE — Telephone Encounter (Signed)
-----   Message from Margo Common, [place] sent at 09/25/2019  3:11 PM EDT ----- Normal blood cell counts without signs of infection. Cholesterol high with need for Atorvastatin 20 mg qd #90 & 3 RF. Recheck blood levels in 3 months. Must get on a low fat diet and exercise regularly. Remainder of blood tests are normal. Continue Allopurinol 100 mg qd to prevent gout attacks.

## 2019-09-25 NOTE — Telephone Encounter (Signed)
LMTCB, okay for PEC to advise patient.  

## 2019-09-25 NOTE — Telephone Encounter (Signed)
If not constipated, he can stop the Metamucil and only use it if needed. Should use Claritin 10 mg qd with Flonase nasal spray daily for any allergy drainage that can irritate the throat. Need to recheck the throat in the office to see if ENT referral is appropriate.

## 2019-09-26 NOTE — Telephone Encounter (Signed)
LMOVM for pt to return call 

## 2019-09-30 NOTE — Telephone Encounter (Signed)
Patient has been advised. KW 

## 2019-11-03 ENCOUNTER — Ambulatory Visit: Payer: Self-pay

## 2019-11-03 NOTE — Telephone Encounter (Signed)
Patients wife called stating that her husband has been coughing up blood.  She states that it has been going on for several months. He has been seen by Vernie Murders virtual visit for cough. She states that he has been stopping breathing when sleeping intermittently then letting out a big snore. She has company and they stat that his breathing is noisy wheezes. He has no fever.  She feels his neck looks large and swollen.  Mrs Ostwald states that he has not been himself.  She has noticed that his stomach has gotten bigger even though he is not eating much. She states he is eating sweets.  Per protocol patient wife was told to take her husband to ER for evaluation of symptoms Care advice was read to wife.  She verbalized understanding of all information..   Reason for Disposition . Unclear to triager if the patient is coughing up blood or vomiting blood  Answer Assessment - Initial Assessment Questions 1. ONSET: "When did you start coughing up blood?"     since last time he spoke with Vernie Murders been t least 2 months 2. SEVERITY: "How many times?" "How much blood?" (e.g., flecks, streaks, tablespoons, etc)     Looks red and sticky 3. COUGHING SPASMS: "Did the blood appear after a coughing spell?"      After cough 4. RESPIRATORY DISTRESS: "Describe your breathing."      Sleeping stops breathing Sleep Apnea, whezes when awake 5. FEVER: "Do you have a fever?" If so, ask: "What is your temperature, how was it measured, and when did it start?"    No 6. SPUTUM: "Describe the color of your sputum" (clear, white, yellow, green), "Has there been any change recently?"     Dark bloody 7. CARDIAC HISTORY: "Do you have any history of heart disease?" (e.g., heart attack, congestive heart failure)      Bypass 2017 quadruple 8. LUNG HISTORY: "Do you have any history of lung disease?"  (e.g., pulmonary embolus, asthma, emphysema)     no 9. PE RISK FACTORS: "Do you have a history of blood clots?" (or:  recent major surgery, recent prolonged travel, bedridden)    None since 2017 10. OTHER SYMPTOMS: "Do you have any other symptoms?" (e.g., nosebleed, chest pain, abdominal pain, vomiting)       None wife states stomach is getting large 11. PREGNANCY: "Is there any chance you are pregnant?" "When was your last menstrual period?"      N/A 12. TRAVEL: "Have you traveled out of the country in the last month?" (e.g., travel history, exposures)       N/A  Protocols used: COUGHING UP BLOOD-A-AH

## 2020-01-09 ENCOUNTER — Telehealth: Payer: Self-pay

## 2020-01-09 NOTE — Telephone Encounter (Signed)
Opened in error

## 2020-01-11 ENCOUNTER — Emergency Department (HOSPITAL_COMMUNITY): Payer: 59

## 2020-01-11 ENCOUNTER — Encounter (HOSPITAL_COMMUNITY): Payer: Self-pay

## 2020-01-11 ENCOUNTER — Emergency Department (HOSPITAL_COMMUNITY)
Admission: EM | Admit: 2020-01-11 | Discharge: 2020-01-11 | Disposition: A | Discharge status: Home / Self Care | Payer: 59 | Attending: Emergency Medicine | Admitting: Emergency Medicine

## 2020-01-11 DIAGNOSIS — H9201 Otalgia, right ear: Secondary | ICD-10-CM | POA: Diagnosis not present

## 2020-01-11 DIAGNOSIS — C069 Malignant neoplasm of mouth, unspecified: Secondary | ICD-10-CM | POA: Diagnosis not present

## 2020-01-11 DIAGNOSIS — I25110 Atherosclerotic heart disease of native coronary artery with unstable angina pectoris: Secondary | ICD-10-CM | POA: Diagnosis not present

## 2020-01-11 DIAGNOSIS — Z79899 Other long term (current) drug therapy: Secondary | ICD-10-CM | POA: Diagnosis not present

## 2020-01-11 DIAGNOSIS — Z7982 Long term (current) use of aspirin: Secondary | ICD-10-CM | POA: Insufficient documentation

## 2020-01-11 DIAGNOSIS — I10 Essential (primary) hypertension: Secondary | ICD-10-CM | POA: Insufficient documentation

## 2020-01-11 DIAGNOSIS — R221 Localized swelling, mass and lump, neck: Secondary | ICD-10-CM | POA: Diagnosis present

## 2020-01-11 LAB — CBC WITH DIFFERENTIAL/PLATELET
Abs Immature Granulocytes: 0.01 10*3/uL (ref 0.00–0.07)
Basophils Absolute: 0 10*3/uL (ref 0.0–0.1)
Basophils Relative: 0 %
Eosinophils Absolute: 0.1 10*3/uL (ref 0.0–0.5)
Eosinophils Relative: 1 %
HCT: 41.2 % (ref 39.0–52.0)
Hemoglobin: 13.6 g/dL (ref 13.0–17.0)
Immature Granulocytes: 0 %
Lymphocytes Relative: 17 %
Lymphs Abs: 0.9 10*3/uL (ref 0.7–4.0)
MCH: 28.8 pg (ref 26.0–34.0)
MCHC: 33 g/dL (ref 30.0–36.0)
MCV: 87.3 fL (ref 80.0–100.0)
Monocytes Absolute: 0.4 10*3/uL (ref 0.1–1.0)
Monocytes Relative: 8 %
Neutro Abs: 4.1 10*3/uL (ref 1.7–7.7)
Neutrophils Relative %: 74 %
Platelets: 154 10*3/uL (ref 150–400)
RBC: 4.72 MIL/uL (ref 4.22–5.81)
RDW: 13.5 % (ref 11.5–15.5)
WBC: 5.6 10*3/uL (ref 4.0–10.5)
nRBC: 0 % (ref 0.0–0.2)

## 2020-01-11 LAB — BASIC METABOLIC PANEL
Anion gap: 13 (ref 5–15)
BUN: 19 mg/dL (ref 8–23)
CO2: 22 mmol/L (ref 22–32)
Calcium: 9.4 mg/dL (ref 8.9–10.3)
Chloride: 105 mmol/L (ref 98–111)
Creatinine, Ser: 0.93 mg/dL (ref 0.61–1.24)
GFR calc Af Amer: >60 mL/min (ref >60)
GFR calc non Af Amer: >60 mL/min (ref >60)
Glucose, Bld: 140 mg/dL — ABNORMAL HIGH (ref 70–99)
Potassium: 4.3 mmol/L (ref 3.5–5.1)
Sodium: 140 mmol/L (ref 135–145)

## 2020-01-11 MED ORDER — IOHEXOL 300 MG/ML  SOLN
75.0000 mL | Freq: Once | INTRAMUSCULAR | Status: AC | PRN
  Administered 2020-01-11: 75 mL via INTRAVENOUS

## 2020-01-11 MED ORDER — SODIUM CHLORIDE (PF) 0.9 % IJ SOLN
INTRAMUSCULAR | Status: AC
  Filled 2020-01-11: qty 50

## 2020-01-11 NOTE — Discharge Instructions (Signed)
Unfortunately, your CT scan today is highly concerning for cancer. This would explain a lot of the symptoms you have been experiencing. You need to make appointments with both oncology and ENT for close follow-up.

## 2020-01-11 NOTE — ED Provider Notes (Signed)
[facility] DEPT Provider Note   CSN: [id number] Arrival date & time: 01/11/20  9892     History Chief Complaint  Patient presents with  . Dental Pain    Omar Christian is a 64 y.o. male.  HPI   64yM with R neck swelling and ear pain. He has been having various symptoms for several months and they are progressively worsening. He worked in a [employer] for a long time. Thought initially that ear pain may be from ear protection he wore. Also a sore throat. Occasionally coughing/spitting up small amount of blood. Sometimes in the morning he will notice black or red material around his mouth. He has some concern that pain may be dental related. Has yet to see a dentist. Has been trying to see ENT for several months but finding difficulty actually being seen. Family has noticed some changes in his voice. They have also noticed that his appetite has been decreased and he has been sleeping more. Never used tobacco products.   Past Medical History:  Diagnosis Date  . Abnormal findings on cardiac catheterization 09/30/15   [facility]  . Abnormal myocardial perfusion study 09/24/15   [facility]  . CAD, multiple vessel 09/30/15   per CATH @ [facility]/DR.PARACHOS  . Coronary artery disease   . GERD (gastroesophageal reflux disease)   . Gout   . H/O echocardiogram 09/24/15   [place]  . Hard of hearing   . History of pneumonia   . Hyperlipidemia   . Hypertension   . Progressive angina [facility])    Patient Active Problem List   Diagnosis Date Noted  . 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 Unstable angina pectoris ([place]) 10/04/2015  . Progressive angina ([place])   . Abnormal findings on cardiac catheterization 09/30/2015  . CAD, multiple vessel 09/30/2015  . Abnormal findings diagnostic imaging of heart and coronary circulation 09/30/2015  . CAD in native artery 09/30/2015  . H/O echocardiogram 09/24/2015  . Abnormal myocardial perfusion study 09/24/2015  . Chest pain  09/03/2015  . Allergic rhinitis 08/27/2015  . H/O: gout 08/27/2015  . HLD (hyperlipidemia) 08/27/2015  . Benign hypertension 08/27/2015  . Acid reflux 08/27/2015  . Essential (primary) hypertension 08/27/2015  . H/O disease 08/27/2015   Past Surgical History:  Procedure Laterality Date  . CARDIAC CATHETERIZATION N/A 09/30/2015   Procedure: Left Heart Cath and Coronary Angiography;  Surgeon: [name], MD;  Location: [facility] CV LAB;  Service: Cardiovascular;  Laterality: N/A;  . CORONARY ARTERY BYPASS GRAFT N/A 10/08/2015   Procedure: CORONARY ARTERY BYPASS GRAFTING time 4 using left internal mammary and right greater saphenous vein harvested by endovein;  Surgeon: [name], MD;  Location: [facility];  Service: Open Heart Surgery;  Laterality: N/A;  . TEE WITHOUT CARDIOVERSION N/A 10/08/2015   Procedure: TRANSESOPHAGEAL ECHOCARDIOGRAM (TEE);  Surgeon: [name], MD;  Location: [facility];  Service: Open Heart Surgery;  Laterality: N/A;  . WISDOM TOOTH EXTRACTION       Family History  Problem Relation Age of Onset  . Heart disease Mother   . Hypertension Mother   . Alcohol abuse Father   . Throat cancer Father   . Muscular dystrophy Brother   . Muscular dystrophy Brother    Social History   Tobacco Use  . Smoking status: Never Smoker  . Smokeless tobacco: Never Used  Substance Use Topics  . Alcohol use: No  . Drug use: No   Home Medications Prior to  Admission medications   Medication Sig Start Date End Date Taking? Authorizing Provider  allopurinol (ZYLOPRIM) 100 MG tablet Take 1 tablet (100 mg total) by mouth daily. 07/22/19   Birdie Sons, MD  amoxicillin (AMOXIL) 875 MG tablet Take 1 tablet (875 mg total) by mouth 2 (two) times daily. 09/16/19   Chrismon, Vickki Muff, PA  aspirin EC 325 MG EC tablet Take 1 tablet (325 mg total) by mouth daily. 10/12/15   Nani Skillern, PA-C  atorvastatin (LIPITOR) 20 MG tablet Take 1 tablet (20 mg total) by mouth  daily. 09/25/19   Chrismon, Vickki Muff, PA  atorvastatin (LIPITOR) 40 MG tablet TAKE 1 TABLET (40 MG TOTAL) BY MOUTH ONCE DAILY. 12/24/16   [provider]  fluticasone Asencion Islam) 50 MCG/ACT nasal spray Reported on 11/15/2015 08/06/15   [provider]  ibuprofen (ADVIL,MOTRIN) 600 MG tablet Take 1 tablet (600 mg total) by mouth every 8 (eight) hours as needed. 01/09/17   Darel Hong, MD  indomethacin (INDOCIN) 50 MG capsule Take 1 capsule (50 mg total) by mouth 3 (three) times daily with meals. As needed for foot pain 07/22/19   Birdie Sons, MD  loratadine (CLARITIN) 10 MG tablet TAKE ONE TABLET BY MOUTH ONCE DAILY Patient not taking: Reported on 01/08/2017 08/05/15   Chrismon, Vickki Muff, PA  metoprolol tartrate (LOPRESSOR) 25 MG tablet Take 0.5 tablets (12.5 mg total) by mouth 2 (two) times daily. Patient not taking: Reported on 07/22/2019 10/01/15   [name], MD  Psyllium (METAMUCIL PO) Take 1 scoop by mouth daily as needed (for constipation).     [provider]   Allergies    Patient has no known allergies.  Review of Systems   Review of Systems All systems reviewed and negative, other than as noted in HPI.  Physical Exam Updated Vital Signs BP (!) 151/83 (BP Location: Left Arm)   Pulse (!) 104   Temp 99.6 F (37.6 C) (Oral)   Resp 15   Ht 6' (1.829 m)   Wt 104.3 kg   SpO2 97%   BMI 31.19 kg/m   Physical Exam Vitals and nursing note reviewed.  Constitutional:      General: He is not in acute distress.    Appearance: He is well-developed.  HENT:     Head: Normocephalic and atraumatic.     Right Ear: Tympanic membrane, ear canal and external ear normal.     Nose: Nose normal.     Mouth/Throat:     Comments: Swelling R tonsillar region. Uvula deviated. Raspy sounding voice. Handling secretions. No stridor. R neck mass. R ear is normal. Multiple dental fillings. Dentition overall looks ok.  Eyes:     General:        Right eye: No discharge.         Left eye: No discharge.     Conjunctiva/sclera: Conjunctivae normal.  Cardiovascular:     Rate and Rhythm: Normal rate and regular rhythm.     Heart sounds: Normal heart sounds. No murmur heard.  No friction rub. No gallop.   Pulmonary:     Effort: Pulmonary effort is normal. No respiratory distress.     Breath sounds: Normal breath sounds.  Abdominal:     General: There is no distension.     Palpations: Abdomen is soft.     Tenderness: There is no abdominal tenderness.  Musculoskeletal:        General: No tenderness.     Cervical back:  Neck supple.  Skin:    General: Skin is warm and dry.  Neurological:     Mental Status: He is alert.  Psychiatric:        Behavior: Behavior normal.        Thought Content: Thought content normal.    ED Results / Procedures / Treatments   Labs (all labs ordered are listed, but only abnormal results are displayed) Labs Reviewed  BASIC METABOLIC PANEL - Abnormal; Notable for the following components:      Result Value   Glucose, Bld 140 (*)    All other components within normal limits  CBC WITH DIFFERENTIAL/PLATELET    EKG None  Radiology CT Maxillofacial W Contrast  Result Date: 01/11/2020 CLINICAL DATA:  Right upper dental pain right-sided neck swelling. Right-sided ear pain symptoms for 6 months. EXAM: CT MAXILLOFACIAL WITH CONTRAST TECHNIQUE: Multidetector CT imaging of the maxillofacial structures was performed with intravenous contrast. Multiplanar CT image reconstructions were also generated. CONTRAST:  25mL OMNIPAQUE IOHEXOL 300 MG/ML  SOLN COMPARISON:  None. FINDINGS: Osseous: Negative for destructive process. Orbits: No evidence of mass or inflammation Sinuses: Clear Soft tissues: Mass in the right tonsillar fossa and glossotonsillar sulcus with bilateral root of tongue invasion, 4.8 cm maximum on axial slices. Multiple ipsilateral necrotic lymph nodes in the right jugular chain with the largest discrete node measuring 3.2 cm  craniocaudal. Based on the nodal margins there may be extracapsular disease. Limited intracranial: No evidence of intracranial spread of disease. 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 Electronically Signed   By: Monte Fantasia M.D.   On: 01/11/2020 10:00    Procedures Procedures (including critical care time)  Medications Ordered in ED Medications  sodium chloride (PF) 0.9 % injection (has no administration in time range)  iohexol (OMNIPAQUE) 300 MG/ML solution 75 mL (75 mLs Intravenous Contrast Given 01/11/20 6759)    ED Course  I have reviewed the triage vital signs and the nursing notes.  Pertinent labs & imaging results that were available during my care of the patient were reviewed by me and considered in my medical decision making (see chart for details).    MDM Rules/Calculators/A&P                          64yM with R facial/ear/neck pain and R neck swelling. Has had progressive symptoms for months. Unfortunately, COVID restrictions have been a barrier to in person evaluation. CT today highly concerning for cancer. Discussed imaging findings with patient and daughter. Needs close ENT and oncology follow-up.    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

## 2020-01-11 NOTE — ED Notes (Signed)
[name], daughter, can be reached at [phone]. She accompanied the patient to the hospital today.

## 2020-01-11 NOTE — ED Triage Notes (Signed)
Pt reports R upper dental pain. He also reports R sided neck swelling and R sided otalgia. Pt is hard of hearing. States that he has been dealing with the pain for about 6 months. A&Ox4. Ambulatory. Denies SOB, but endorses productive cough.

## 2020-01-20 ENCOUNTER — Other Ambulatory Visit: Payer: Self-pay | Admitting: Otolaryngology

## 2020-01-20 ENCOUNTER — Telehealth: Payer: Self-pay | Admitting: Hematology and Oncology

## 2020-01-20 DIAGNOSIS — C099 Malignant neoplasm of tonsil, unspecified: Secondary | ICD-10-CM

## 2020-01-20 NOTE — Telephone Encounter (Signed)
Received a new pt referral from Dr. Benjamine Mola for tonsil cancer. Omar Christian has been cld and scheduled to see Dr. Lindi Adie on 7/27 at 345pm. Appt date and time has been given to the pt's wife and daughter. Aware to arrive 15 minutes early.

## 2020-01-23 NOTE — Progress Notes (Signed)
Oncology Nurse Navigator Documentation  Placed introductory call to new referral patient Omar Christian. Spoke with daughter Omar Christian  Introduced myself as the H&N oncology nurse navigator that works with Dr. Lindi Adie and Drs Isidore Moos to whom he has been referred by Dr. Benjamine Mola. She confirmed understanding of referral.  Briefly explained my role as his navigator, provided my contact information.   Confirmed understanding of upcoming appts and [place] location, explained arrival and registration process.  I explained the purpose of a dental evaluation prior to starting RT, indicated he would be contacted by [facility] to arrange an appt.    I encouraged them to call with questions/concerns as he moves forward with appts and procedures.    She verbalized understanding of information provided, expressed appreciation for my call.   Navigator Initial Assessment . Employment Status: He is retired . Currently on FMLA / STD: No . Living Situation: He lives with his wife Omar Christian. . Support System: Wife and Daughter, Omar Christian . PCP: Dr. Natale Milch, [place] . PCD: no . Financial Concerns: yes . Transportation Needs: no . Sensory Deficits: he is hard of hearing . Language Barriers/Interpreter Needed:  no . Ambulation Needs: no . DME Used in Home: no . Psychosocial Needs:  no . Concerns/Needs Understanding Cancer:  addressed/answered by navigator to best of ability Self-Expressed Needs: no   Harlow Asa RN, BSN, OCN Head & Neck Oncology Nurse [place] at [facility] Phone # [phone]  Fax # [phone]

## 2020-01-23 NOTE — Progress Notes (Signed)
Head and Neck Cancer Location of Tumor / Histology:  Squamous cell carcinoma RIGHT tonsil  Patient presented with symptoms of:   Biopsies revealed:  01/14/2020   Nutrition Status Yes No Comments  Weight changes? []  [x]    Swallowing concerns? [x]  []  Discomfort along right lateral jaw line that flares up with eating/drinking.   PEG? []  [x]     Referrals Yes No Comments  Social Work? [x]  []    Dentistry? [x]  []  Seeing Dr. Teena Dunk tomorrow  Swallowing therapy? [x]  []    Nutrition? [x]  []    Med/Onc? [x]  []     Safety Issues Yes No Comments  Prior radiation? []  [x]    Pacemaker/ICD? []  [x]    Possible current pregnancy? []  [x]    Is the patient on methotrexate? []  [x]     Tobacco/Marijuana/Snuff/ETOH use: None, never smoked or drank alcohol  Past/Anticipated interventions by otolaryngology, if any:  Dr. Leta Baptist 01/14/2020  Past/Anticipated interventions by medical oncology, if any:  Scheduled for consult with Dr. [name] later today   Current Complaints / other details:   Nothing of note

## 2020-01-26 ENCOUNTER — Encounter: Payer: Self-pay | Admitting: *Deleted

## 2020-01-26 ENCOUNTER — Encounter (HOSPITAL_COMMUNITY)
Admission: RE | Admit: 2020-01-26 | Discharge: 2020-01-26 | Disposition: A | Discharge status: Home / Self Care | Payer: 59 | Source: Ambulatory Visit | Attending: Otolaryngology | Admitting: Otolaryngology

## 2020-01-26 ENCOUNTER — Other Ambulatory Visit: Payer: Self-pay

## 2020-01-26 ENCOUNTER — Other Ambulatory Visit: Payer: Self-pay | Admitting: *Deleted

## 2020-01-26 DIAGNOSIS — C099 Malignant neoplasm of tonsil, unspecified: Secondary | ICD-10-CM | POA: Diagnosis present

## 2020-01-26 LAB — GLUCOSE, CAPILLARY: Glucose-Capillary: 123 mg/dL — ABNORMAL HIGH (ref 70–99)

## 2020-01-26 MED ORDER — FLUDEOXYGLUCOSE F - 18 (FDG) INJECTION
11.5000 | Freq: Once | INTRAVENOUS | Status: AC | PRN
  Administered 2020-01-26: 11.5 via INTRAVENOUS

## 2020-01-26 NOTE — Progress Notes (Signed)
[place] NOTE  Patient Care Team: Chrismon, Vickki Muff, PA as PCP - General (Family Medicine) [name], MD as Consulting Physician (Cardiothoracic Surgery) [name], MD as Consulting Physician (Cardiology) Serena Colonel, RN as [place] Management  CHIEF COMPLAINTS/PURPOSE OF CONSULTATION:  Newly diagnosed tonsil cancer  HISTORY OF PRESENTING ILLNESS:  Omar Christian 64 y.o. male is here because of recent diagnosis of tonsil cancer. He is referred by Dr. Benjamine Mola. Patient noted throat and ear pain, and a neck mass and swelling. 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 PET scan on 01/26/20 showed the right tonsillar mass extending to the bilateral tongue base, at least 4.9cm, with associated right cervical nodal metastases and no evidence of distant metastases. He has no history of tobacco use. He has a family history of throat cancer in his father. He presents to the clinic today for initial evaluation and discussion of treatment options.  He does not have any pain or difficulty swallowing.  He has hoarse voice.  He has swelling of the right side of the neck.  His daughter tells me that the symptoms have been getting worse since December 2020.  Prior to all of this he had bronchitis symptoms and he thought all of these were related to bronchitis.  I reviewed his records extensively and collaborated the history with the patient.   MEDICAL HISTORY:  Past Medical History:  Diagnosis Date   Abnormal findings on cardiac catheterization 09/30/15   [facility]   Abnormal myocardial perfusion study 09/24/15   [facility]   CAD, multiple vessel 09/30/15   per CATH @ [facility]/DR.PARACHOS   Coronary artery disease    GERD (gastroesophageal  reflux disease)    Gout    H/O echocardiogram 09/24/15   [facility]   Hard of hearing    History of pneumonia    Hyperlipidemia    Hypertension    Progressive angina ([place])     SURGICAL HISTORY: Past Surgical History:  Procedure Laterality Date   CARDIAC CATHETERIZATION N/A 09/30/2015   Procedure: Left Heart Cath and Coronary Angiography;  Surgeon: [name], MD;  Location: [place] [facility];  Service: Cardiovascular;  Laterality: N/A;   CORONARY ARTERY BYPASS GRAFT N/A 10/08/2015   Procedure: CORONARY ARTERY BYPASS GRAFTING time 4 using left internal mammary and right greater saphenous vein harvested by endovein;  Surgeon: [name], MD;  Location: [place];  Service: Open Heart Surgery;  Laterality: N/A;   TEE WITHOUT CARDIOVERSION N/A 10/08/2015   Procedure: TRANSESOPHAGEAL ECHOCARDIOGRAM (TEE);  Surgeon: [name], MD;  Location: [place];  Service: Open Heart Surgery;  Laterality: N/A;   WISDOM TOOTH EXTRACTION      SOCIAL HISTORY: Social History   Socioeconomic History   Marital status: Married    Spouse name: Not on file   Number of children: Not on file   Years of education: Not on file   Highest education level: Not on file  Occupational History   Not on file  Tobacco Use   Smoking status: Never Smoker   Smokeless tobacco: Never Used  Substance and Sexual Activity   Alcohol use: No   Drug use: No   Sexual activity: Not Currently  Other Topics Concern   Not on file  Social  History Narrative   Not on file   Social Determinants of Health   Financial Resource Strain: Low Risk    Difficulty of Paying Living Expenses: Not very hard  Food Insecurity:    Worried About Charity fundraiser in the Last Year:    Arboriculturist in the Last Year:   Transportation Needs: No Transportation Needs   Lack of Transportation (Medical): No   Lack of Transportation (Non-Medical): No  Physical Activity:    Days of Exercise  per Week:    Minutes of Exercise per Session:   Stress:    Feeling of Stress :   Social Connections:    Frequency of Communication with Friends and Family:    Frequency of Social Gatherings with Friends and Family:    Attends Religious Services:    Active Member of Clubs or Organizations:    Attends Music therapist:    Marital Status:   Intimate Partner Violence:    Fear of Current or Ex-Partner:    Emotionally Abused:    Physically Abused:    Sexually Abused:     FAMILY HISTORY: Family History  Problem Relation Age of Onset   Heart disease Mother    Hypertension Mother    Alcohol abuse Father    Throat cancer Father    Muscular dystrophy Brother    Muscular dystrophy Brother     ALLERGIES:  has No Known Allergies.  MEDICATIONS:  Current Outpatient Medications  Medication Sig Dispense Refill   acetaminophen (TYLENOL) 650 MG CR tablet Take 650 mg by mouth every 8 (eight) hours as needed for pain.     allopurinol (ZYLOPRIM) 100 MG tablet Take 1 tablet (100 mg total) by mouth daily. 30 tablet 12   atorvastatin (LIPITOR) 20 MG tablet Take 1 tablet (20 mg total) by mouth daily. 90 tablet 3   fluticasone (FLONASE) 50 MCG/ACT nasal spray Reported on 11/15/2015 (Patient not taking: Reported on 01/26/2020)  3   indomethacin (INDOCIN) 50 MG capsule Take 1 capsule (50 mg total) by mouth 3 (three) times daily with meals. As needed for foot pain (Patient not taking: Reported on 01/26/2020) 30 capsule 1   oxyCODONE-acetaminophen (PERCOCET/ROXICET) 5-325 MG tablet Take 1 tablet by mouth every 4 (four) hours as needed. (Patient not taking: Reported on 01/27/2020)     No current facility-administered medications for this visit.    REVIEW OF SYSTEMS:   Constitutional: Denies fevers, chills or abnormal night sweats Eyes: Denies blurriness of vision, double vision or watery eyes Ears, nose, mouth, throat, and face: Denies mucositis or sore  throat Respiratory: Denies cough, dyspnea or wheezes Cardiovascular: Denies palpitation, chest discomfort or lower extremity swelling Gastrointestinal:  Denies nausea, heartburn or change in bowel habits Skin: Denies abnormal skin rashes Lymphatics: Denies new lymphadenopathy or easy bruising Neurological:Denies numbness, tingling or new weaknesses Behavioral/Psych: Mood is stable, no new changes  All other systems were reviewed with the patient and are negative.  PHYSICAL EXAMINATION: ECOG PERFORMANCE STATUS: 1 - Symptomatic but completely ambulatory  Vitals:   01/27/20 1432 01/27/20 1440  BP: (!) 164/74   Pulse: 66 66  Resp: 20 20  Temp: 98.3 F (36.8 C)   SpO2: 96%    Filed Weights   01/27/20 1432  Weight: (!) 226 lb (102.5 kg)    GENERAL:alert, no distress and comfortable SKIN: skin color, texture, turgor are normal, no rashes or significant lesions EYES: normal, conjunctiva are pink and non-injected, sclera clear OROPHARYNX:no exudate, no  erythema and lips, buccal mucosa, and tongue normal  NECK: supple, thyroid normal size, non-tender, without nodularity LYMPH:  no palpable lymphadenopathy in the cervical, axillary or inguinal LUNGS: clear to auscultation and percussion with normal breathing effort HEART: regular rate & rhythm and no murmurs and no lower extremity edema ABDOMEN:abdomen soft, non-tender and normal bowel sounds Musculoskeletal:no cyanosis of digits and no clubbing  PSYCH: alert & oriented x 3 with fluent speech NEURO: no focal motor/sensory deficits  LABORATORY DATA:  I have reviewed the data as listed Lab Results  Component Value Date   WBC 5.6 01/11/2020   HGB 13.6 01/11/2020   HCT 41.2 01/11/2020   MCV 87.3 01/11/2020   PLT 154 01/11/2020   Lab Results  Component Value Date   NA 140 01/11/2020   K 4.3 01/11/2020   CL 105 01/11/2020   CO2 22 01/11/2020    RADIOGRAPHIC STUDIES: I have personally reviewed the radiological reports and  agreed with the findings in the report.  ASSESSMENT AND PLAN:  Tonsillar cancer ([place]) 01/14/2020: 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  Two right cervical lymph nodes metastases measuring 2.5 cm and 2.1 cm [id number] stage IVa HPV status: Positive  Counseling: I discussed with the patient extensively about the etiology and pathogenesis of head and neck cancer and extent of disease involvement in his tonsil extending to base of tongue and the cervical lymph nodes. Even though it is stage IV A, it is considered to be curable cancer.  Recommendation for treatment: Concurrent chemoradiation with cisplatin. Cisplatin counseling: I discussed extensively the risks and benefits of cisplatin including the risk of cytopenias, risk of infection, hearing loss, neuropathy, hair loss, mucositis, nausea and vomiting or even death.  Plan: 1.  Radiation consultation 2. Dental evaluation from Dr. Dorothyann Gibbs is arranged for tomorrow 3.  Port placement: IR 4.  NG tube: IR  5.  Hearing evaluation: Requested ENT 6.  Nutritionist evaluation Start chemotherapy concurrently with radiation.  Patient will come weekly for IV hydration to replace potassium and magnesium. I am extremely worried about his hearing because he already has existing hearing loss.  We discussed alternatives to cisplatin but the patient is reluctant to receive cisplatin because of his higher cure rate.  If he cannot tolerate cisplatin we will switch him to carboplatin.   Return to clinic the start of chemotherapy    All questions were answered. The patient knows to call the clinic with any problems, questions or concerns.   Rulon Eisenmenger, MD, MPH 01/27/2020    I, Molly Dorshimer, am acting as scribe for [name], MD.  I have reviewed the above documentation for accuracy and completeness, and I agree with the above.

## 2020-01-26 NOTE — Patient Outreach (Signed)
[facility]) Care Management  [place]  01/26/2020   Omar Christian 01-23-1956 [id number]  Subjective: Telephone call to patient's home / mobile number, spoke with patient, and HIPAA verified.  Discussed [facility] Care Management [employer] plan referral follow up, patient voiced understanding, and is in agreement to follow up.  Patient gave this RNCM verbal consent to speak with his wife [name]), and daughter [name] regarding his healthcare needs as needed.  Patient requested that RNCM speak with wife to complete remainder of assessment due to him being hard of hearing.  Spoke with patient as needed during the assessment for patient required answered questions.   Discussed [facility] Care Management [employer] plan referral follow up, patient's wife voiced understanding, and is in agreement to follow up on patient's behalf.   Patient and wife states, patient is doing fine, if his tooth did not bother him, and he did not have cancer.  Wife gave RNCM verbal update on how patient's symptoms started and progressed over time, had phone provider follow up visit, had no in person office visits due to covid restrictions.  Wife states patient  is able to manage self care and has assistance as needed. Wife voices understanding of medical diagnosis, she states treatment plan pending outcome of today's PET Scan,  treatment plan will be discussed on 01/27/2020 with patient and family.   Wife states patient has received  Covid vaccine (Moderna) times 2.   Wife states patient has the following appointments per patient's daughter [name] / per patient's chart:  PET Scan on 01/26/2020, Radiation Oncology RN, Radiation Oncology MD, and Medical Oncology MD appointments pending for 01/27/2020.  States per paperwork from [facility], Dr.  Kasandra Knudsen Raynelle Bring (ENT / Otolaryngologist) has ordered today's PET Scan and is not aware of a follow up appointment with provider at this time.   Wife will notify RNCM  of ENT follow up appointment status.  Wife advised RNCM of the referral and authorization paperwork that she had received from patient's insurance company [facility]) regarding approval of PET Scan, services at [facility], and [employer].  States family may request services at [facility] if recommended by MD for closer proximity to patient's home but will go where patient will receive the best care for needed services.   RNCM advised wife of RNCM's role with care coordination, upcoming Multidisciplinary Case Discussion on 01/29/2020 with [facility] Management team, and [facility].  Wife is aware that wife will need to follow up with [facility] regarding authorization processing, authorizations, and benefit information.  Wife she is accessing patient's [employer] benefits on patient's behalf, as needed via member services number on back of card.  Discussed importance of ED follow up with primary MD, wife voices understanding, and states she will follow up as appropriate.   States she has not scheduled follow up appointment at this time and will follow up once treatment plan is determined.  States patient may change primary care provider if needed, if recommended by oncologist, and this will be determine on 01/27/2020 after the family has discussed patient's treatment plan.  Wife is aware to contact insurance company for a list of in network primary care providers if needed.  Wife states patient daughter [name] is a Librarian, academic, is coordinating patient's care, and taking him to follow up appointments as needed.   Wife states she is discussing patient's care with providers, with patient's daughter,  as needed via phone, due to  covid restrictions in provider office, and she does not like to drive in [place].  Wife states patient hearing has worsened over the last year, she is wondering if this is related to patient's cancer,  needs to be addressed, and is planning to ask patient's daughter to  address at patient's next provider follow up visit.  Wife states patient right upper tooth pain has increased over the last several months, she is wondering if this is related to patient's cancer,  needs to be addressed, has not seen a dentist in years, and is planning to ask patient's daughter to address at patient's next provider follow up visit.  Discussed Advanced Directives, advised of [place] Management  Advanced Directives packet resource, wife voices understanding, and is in agreement to be sent packet at this time.   RNCM advised of [employer] document scanning  system for Advanced Directives documents via local providers office or hospitals, wife  voices understanding, and she will discuss accessing through patient's primary provider if needed in the future.    Wife states patient has discussed donating body to science/ organ donation and would like educational information on this subject.   Wife in agreement to receive the following EMMI educational material: Organ Donation.  Wife states patient is in need of RNCM Telephonic care coordination for patient's new cancer diagnosis and may need additional education in the future for gout.  Wife states patient does not have any transportation, Data processing manager, or pharmacy needs at this time.   States she is very appreciative of the follow up and is in agreement to continue to receive [facility] Management information / services on patient's behalf.      Objective: Per KPN (Knowledge Performance Now, point of care tool) and chart review, patient has not had a recent hospitalization.  Patient had ED visit on 01/11/2020 for dental pain, right facial/ neck pain/ swelling, and oral cancer.  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    Encounter Medications:  Outpatient Encounter Medications as of 01/26/2020  Medication Sig Note  . acetaminophen (TYLENOL)  650 MG CR tablet Take 650 mg by mouth every 8 (eight) hours as needed for pain.   Marland Kitchen allopurinol (ZYLOPRIM) 100 MG tablet Take 1 tablet (100 mg total) by mouth daily.   Marland Kitchen atorvastatin (LIPITOR) 20 MG tablet Take 1 tablet (20 mg total) by mouth daily.   Marland Kitchen atorvastatin (LIPITOR) 40 MG tablet TAKE 1 TABLET (40 MG TOTAL) BY MOUTH ONCE DAILY.   Marland Kitchen amoxicillin (AMOXIL) 875 MG tablet Take 1 tablet (875 mg total) by mouth 2 (two) times daily. (Patient not taking: Reported on 01/26/2020) 01/26/2020: Wife states has completed.   Marland Kitchen aspirin EC 325 MG EC tablet Take 1 tablet (325 mg total) by mouth daily. (Patient not taking: Reported on 01/26/2020) 01/26/2020: Wife states discontinued per MD order.   . fluticasone (FLONASE) 50 MCG/ACT nasal spray Reported on 11/15/2015 (Patient not taking: Reported on 01/26/2020) 10/06/2015: On hold  . ibuprofen (ADVIL,MOTRIN) 600 MG tablet Take 1 tablet (600 mg total) by mouth every 8 (eight) hours as needed. (Patient not taking: Reported on 01/26/2020)   . indomethacin (INDOCIN) 50 MG capsule Take 1 capsule (50 mg total) by mouth 3 (three) times daily with meals. As needed for foot pain (Patient not taking: Reported on 01/26/2020)   . loratadine (CLARITIN) 10 MG tablet TAKE ONE TABLET BY MOUTH ONCE DAILY (Patient  not taking: Reported on 01/08/2017)   . metoprolol tartrate (LOPRESSOR) 25 MG tablet Take 0.5 tablets (12.5 mg total) by mouth 2 (two) times daily. (Patient not taking: Reported on 07/22/2019)   . Psyllium (METAMUCIL PO) Take 1 scoop by mouth daily as needed (for constipation).  (Patient not taking: Reported on 01/26/2020)    No facility-administered encounter medications on file as of 01/26/2020.    Functional Status:  In your present state of health, do you have any difficulty performing the following activities: 09/16/2019  Hearing? Y  Vision? N  Difficulty concentrating or making decisions? N  Walking or climbing stairs? N  Dressing or bathing? N  Doing errands, shopping? N   Some recent data might be hidden    Fall/Depression Screening: Fall Risk  09/16/2019  Falls in the past year? 0   PHQ 2/9 Scores 01/26/2020 09/16/2019  PHQ - 2 Score 0 0    Assessment:  Received [employer] plan referral on 01/21/2020.  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  Screening follow up completed and will follow up for new cancer diagnosis/ other diagnosis care coordination.   Goals Addressed            This Visit's Progress   . Patient's wife stated  patient would be fine if he did not have cancer and wants the best care.       CARE PLAN ENTRY (see longitudinal plan of care for additional care plan information)  Current Barriers:  . Care Coordination needs related to new oral cancer  in a patient with hypertension and gout. (disease states)  Nurse Case Manager Clinical Goal(s):  Marland Kitchen Over the next 45 days, patient will verbalize understanding of plan for oral cancer treatment . Over the next 21 days, patient will attend all scheduled medical appointments: Radiation Oncologist MD, Medical Oncologist .  Interventions:  . Inter-disciplinary care team collaboration (see longitudinal plan of care) . Provided education to patient and/or caregiver about advanced directives . Provided education to patient re: [place] Management services and care coordination services, how to access insurance benefits . Discussed plans with patient for ongoing care management follow up and provided patient with direct contact information for care management team . Provided patient with [facility] handout educational materials related to organ donation per patient request . Reviewed scheduled/upcoming provider appointments including: Radiation Oncologist and Medical Oncologist  Patient Self Care Activities:  . Attends all scheduled provider appointments . Performs ADL's independently . Patient is hard of hearing and has assistance from family as needed.  Initial goal  documentation        Plan: RNCM will send patient welcome outreach letter, welcome packet, consent, [facility] pamphlet, magnet, Advanced Directives packet, and Organ Donation (EMMI handout). RNCM will have patient's Multidisciplinary Case Discussion on 01/29/2020 with [place] Management team, and [place].  RNCM will call patient for telephone outreach attempt within 21 business days, care coordination follow up, and proceed with case closure, after 4th unsuccessful outreach call.       Dyer Klug H. Annia Friendly, BSN, [facility] Telephonic CM Phone: [phone] Fax: [phone]

## 2020-01-27 ENCOUNTER — Other Ambulatory Visit: Payer: Self-pay

## 2020-01-27 ENCOUNTER — Ambulatory Visit
Admission: RE | Admit: 2020-01-27 | Discharge: 2020-01-27 | Disposition: A | Discharge status: Home / Self Care | Payer: 59 | Source: Ambulatory Visit | Attending: Radiation Oncology | Admitting: Radiation Oncology

## 2020-01-27 ENCOUNTER — Inpatient Hospital Stay: Payer: 59 | Attending: Hematology and Oncology | Admitting: Hematology and Oncology

## 2020-01-27 ENCOUNTER — Encounter: Payer: Self-pay | Admitting: *Deleted

## 2020-01-27 ENCOUNTER — Encounter: Payer: Self-pay | Admitting: Radiation Oncology

## 2020-01-27 VITALS — BP 164/74 | HR 66 | Temp 100.0°F | Resp 20 | Wt 226.0 lb

## 2020-01-27 VITALS — BP 164/74 | HR 66 | Temp 98.3°F | Resp 20 | Ht 73.0 in | Wt 226.0 lb

## 2020-01-27 DIAGNOSIS — C770 Secondary and unspecified malignant neoplasm of lymph nodes of head, face and neck: Secondary | ICD-10-CM | POA: Diagnosis not present

## 2020-01-27 DIAGNOSIS — Z951 Presence of aortocoronary bypass graft: Secondary | ICD-10-CM | POA: Insufficient documentation

## 2020-01-27 DIAGNOSIS — C098 Malignant neoplasm of overlapping sites of tonsil: Secondary | ICD-10-CM

## 2020-01-27 DIAGNOSIS — E785 Hyperlipidemia, unspecified: Secondary | ICD-10-CM | POA: Diagnosis not present

## 2020-01-27 DIAGNOSIS — I10 Essential (primary) hypertension: Secondary | ICD-10-CM | POA: Insufficient documentation

## 2020-01-27 DIAGNOSIS — H9190 Unspecified hearing loss, unspecified ear: Secondary | ICD-10-CM | POA: Insufficient documentation

## 2020-01-27 DIAGNOSIS — I2510 Atherosclerotic heart disease of native coronary artery without angina pectoris: Secondary | ICD-10-CM | POA: Insufficient documentation

## 2020-01-27 DIAGNOSIS — C099 Malignant neoplasm of tonsil, unspecified: Secondary | ICD-10-CM | POA: Insufficient documentation

## 2020-01-27 DIAGNOSIS — Z79899 Other long term (current) drug therapy: Secondary | ICD-10-CM | POA: Diagnosis not present

## 2020-01-27 DIAGNOSIS — K219 Gastro-esophageal reflux disease without esophagitis: Secondary | ICD-10-CM | POA: Insufficient documentation

## 2020-01-27 NOTE — Progress Notes (Signed)
Radiation Oncology         [phone] ________________________________  Initial outpatient Consultation  Name: Omar Christian MRN: [id number]  Date: 01/27/2020  DOB: August 09, 1955  XN:[id number], Vickki Muff, PA  Leta Baptist, MD   REFERRING PHYSICIAN: Leta Baptist, MD  DIAGNOSIS: STAGE III [id number]   ICD-10-CM   1. 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[place])  C09.8    Cancer Staging Cancer of overlapping sites of tonsil [facility]) Staging form: Pharynx - HPV-Mediated Oropharynx, AJCC 8th Edition - Clinical stage from 01/27/2020: Stage III (cT4, cN1, cM0, p16+) - Signed by [name], MD on 01/31/2020  Tonsillar cancer [facility]) Staging form: Pharynx - P16 Negative Oropharynx, AJCC 8th Edition - Clinical: Stage IVA (cT3, cN2, cM0) - Signed by [name], MD on 01/27/2020  CHIEF COMPLAINT: Here to discuss management of tonsillar cancer  HISTORY OF PRESENT ILLNESS::Omar Christian is a 64 y.o. male who presented to the ED with right-sided neck swelling and ear pain, as well as sore throat, occasional coughing/spitting up small amounts of blood, and changes in his voice. 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  Subsequently, the patient saw Dr. Benjamine Mola who performed laryngoscopy and biopsy. Physical exam showed an ulcerative right tonsillar lesion and multiple firm right level 2 neck masses. Laryngoscopy was normal.   Biopsy of the right tonsil on 01/14/2020 revealed: squamous cell carcinoma with basiloid features; strongly, diffusely + for p16.  Pertinent imaging thus far includes PET scan performed on 01/26/2020 revealing 4.9 cm right tonsillar mass extending into bilateral tongue base with associated right cervical nodal metastases; no evidence of distant metastases. I personally reviewed his images  Swallowing issues, if any: only discomfort at right jaw  Weight  Changes: no  Tobacco history, if any: none  ETOH abuse, if any: none   Prior cancers, if any: none  PREVIOUS RADIATION THERAPY: No  PAST MEDICAL HISTORY:  has a past medical history of Abnormal findings on cardiac catheterization (09/30/15), Abnormal myocardial perfusion study (09/24/15), CAD, multiple vessel (09/30/15), Coronary artery disease, GERD (gastroesophageal reflux disease), Gout, H/O echocardiogram (09/24/15), Hard of hearing, History of pneumonia, Hyperlipidemia, Hypertension, Progressive angina ([place]), and Tonsil cancer ([place]).    PAST SURGICAL HISTORY: Past Surgical History:  Procedure Laterality Date  . CARDIAC CATHETERIZATION N/A 09/30/2015   Procedure: Left Heart Cath and Coronary Angiography;  Surgeon: [name], MD;  Location: [place] [facility];  Service: Cardiovascular;  Laterality: N/A;  . CORONARY ARTERY BYPASS GRAFT N/A 10/08/2015   Procedure: CORONARY ARTERY BYPASS GRAFTING time 4 using left internal mammary and right greater saphenous vein harvested by endovein;  Surgeon: [name], MD;  Location: [place];  Service: Open Heart Surgery;  Laterality: N/A;  . TEE WITHOUT CARDIOVERSION N/A 10/08/2015   Procedure: TRANSESOPHAGEAL ECHOCARDIOGRAM (TEE);  Surgeon: [name], MD;  Location: [place];  Service: Open Heart Surgery;  Laterality: N/A;  . WISDOM TOOTH EXTRACTION      FAMILY HISTORY: family history includes Alcohol abuse in his father; Heart disease in his mother; Hypertension in his mother; Muscular dystrophy in his brother and brother; Throat cancer in his father.  SOCIAL HISTORY:  reports that he has never smoked. He has never used smokeless tobacco. He reports that he does not drink alcohol and does not use drugs.  ALLERGIES: Patient has no known allergies.  MEDICATIONS:  Current Outpatient Medications  Medication  Sig Dispense Refill  . acetaminophen (TYLENOL) 650 MG CR tablet Take 650 mg by mouth daily.     Marland Kitchen allopurinol (ZYLOPRIM) 100 MG  tablet Take 1 tablet (100 mg total) by mouth daily. 30 tablet 12  . atorvastatin (LIPITOR) 20 MG tablet Take 1 tablet (20 mg total) by mouth daily. 90 tablet 3  . indomethacin (INDOCIN) 50 MG capsule Take 1 capsule (50 mg total) by mouth 3 (three) times daily with meals. As needed for foot pain (Patient not taking: Reported on 01/26/2020) 30 capsule 1  . oxyCODONE-acetaminophen (PERCOCET/ROXICET) 5-325 MG tablet Take 1 tablet by mouth every 4 (four) hours as needed.     . sodium fluoride (PREVIDENT 5000 PLUS) 1.1 % CREA dental cream Apply to tooth brush. Brush teeth for 2 minutes. Spit out excess-DO NOT swallow. DO NOT rinse afterwards. Repeat nightly. 51 g prn   No current facility-administered medications for this encounter.    REVIEW OF SYSTEMS:  Notable for that above.   PHYSICAL EXAM:  weight is 226 lb (102.5 kg) (abnormal). His oral temperature is 100 F (37.8 C). His blood pressure is 63/74 (abnormal) and his pulse is 66. His respiration is 20 and oxygen saturation is 96%.   General: Alert and oriented, in no acute distress HEENT: Head is normocephalic. Extraocular movements are intact. Oropharynx is notable for fullness in the right tonsil, palate, crossing midline. Lesion appears submucosal.  Tongue deviates to the right. Neck: Neck is notable for fullness, right level II, over 3-4 fingerbreadths. Heart: Regular in rate and irregular in rhythm (pt and daughter informed, to discuss with PCP). with no murmurs, rubs, or gallops. Chest: Clear to auscultation bilaterally, with no rhonchi, wheezes, or rales. Abdomen: Soft, nontender, nondistended, with no rigidity or guarding. Extremities: No cyanosis or edema. Lymphatics: see Neck Exam Skin: No concerning lesions. Musculoskeletal: ambulatory Neurologic: Other that tongue deviation to right, no obvious focalities. Speech is fluent. Coordination is intact. Psychiatric: Judgment and insight are intact. Affect is appropriate.   ECOG =  1  0 - Asymptomatic (Fully active, able to carry on all predisease activities without restriction)  1 - Symptomatic but completely ambulatory (Restricted in physically strenuous activity but ambulatory and able to carry out work of a light or sedentary nature. For example, light housework, office work)  2 - Symptomatic, <50% in bed during the day (Ambulatory and capable of all self care but unable to carry out any work activities. Up and about more than 50% of waking hours)  3 - Symptomatic, >50% in bed, but not bedbound (Capable of only limited self-care, confined to bed or chair 50% or more of waking hours)  4 - Bedbound (Completely disabled. Cannot carry on any self-care. Totally confined to bed or chair)  5 - Death   Eustace Pen MM, Creech RH, Tormey DC, et al. [phone]). "Toxicity and response criteria of the [facility]". [place] Oncol. 5 (6): 649-55   LABORATORY DATA:  Lab Results  Component Value Date   WBC 5.6 01/11/2020   HGB 13.6 01/11/2020   HCT 41.2 01/11/2020   MCV 87.3 01/11/2020   PLT 154 01/11/2020   CMP     Component Value Date/Time   NA 140 01/11/2020 0823   NA 141 09/19/2019 0950   K 4.3 01/11/2020 0823   CL 105 01/11/2020 0823   CO2 22 01/11/2020 0823   GLUCOSE 140 (H) 01/11/2020 0823   BUN 19 01/11/2020 0823   BUN 9 09/19/2019 0950  CREATININE 0.93 01/11/2020 0823   CALCIUM 9.4 01/11/2020 0823   PROT 6.6 09/19/2019 0950   ALBUMIN 4.3 09/19/2019 0950   AST 24 09/19/2019 0950   ALT 29 09/19/2019 0950   ALKPHOS 80 09/19/2019 0950   BILITOT 0.6 09/19/2019 0950   GFRNONAA >60 01/11/2020 0823   GFRAA >60 01/11/2020 0823      Lab Results  Component Value Date   TSH 1.930 09/19/2019     RADIOGRAPHY: NM PET Image Initial (PI) Skull Base To Thigh  Result Date: 01/26/2020 CLINICAL DATA:  Initial treatment strategy for right head/neck cancer. EXAM: NUCLEAR MEDICINE PET SKULL BASE TO THIGH TECHNIQUE: 11.5 mCi F-18 FDG was injected  intravenously. Full-ring PET imaging was performed from the skull base to thigh after the radiotracer. CT data was obtained and used for attenuation correction and anatomic localization. Fasting blood glucose: 123 mg/dl COMPARISON:  Maxillofacial CT dated 01/11/2020. CT abdomen/pelvis dated 01/09/2017. FINDINGS: Mediastinal blood pool activity: SUV max 3.0 Liver activity: SUV max NA NECK: Right tonsillar mass extending to the right base of tongue and crossing midline to the left, measuring approximately 4.9 x 3.7 cm in maximal axial measurement (series 4/image 29), max SUV 18.3, compatible with primary head/neck cancer. 2.5 cm short axis right level 2 node (series 4/image 32), max SUV 12.5. Additional 2.1 cm necrotic right level 2 node (series 4/image 37), without appreciable hypermetabolism given necrosis, but also suspicious for metastasis. Incidental CT findings: none CHEST: No hypermetabolic thoracic lymphadenopathy. No suspicious pulmonary nodules. Incidental CT findings: Mild atherosclerotic calcifications the descending thoracic aorta. 3 vessel coronary atherosclerosis. Postsurgical changes related to prior CABG. ABDOMEN/PELVIS: No abnormal hypermetabolism in the liver, spleen, pancreas, or adrenal glands. No hypermetabolic abdominopelvic lymphadenopathy. Incidental CT findings: 3.0 x 2.3 cm calcified jejunal mesenteric lesion in the left mid abdomen (series 4/image 139), unchanged from 2018 and non FDG avid, of uncertain etiology but indolent and therefore likely of no clinical significance given the additional findings described above. Mild hepatic steatosis with focal fatty sparing along the gallbladder fossa. Cholelithiasis, without associated inflammatory changes. Nonobstructing right renal calculi measuring 2-3 mm (series 4/image 139). Atherosclerotic calcifications of the abdominal aorta and branch vessels. SKELETON: No focal hypermetabolic activity to suggest skeletal metastasis. Incidental CT  findings: Degenerative changes of the visualized thoracolumbar spine. IMPRESSION: Right tonsillar mass extending to the bilateral tongue base, measuring at least 4.9 cm in maximal axial dimension, better evaluated on recent CT. This corresponds to the patient's known primary head/neck cancer. Associated right cervical nodal metastases. No evidence of distant metastases. Electronically Signed   By: Julian Hy M.D.   On: 01/26/2020 15:29   CT Maxillofacial W Contrast  Result Date: 01/11/2020 CLINICAL DATA:  Right upper dental pain right-sided neck swelling. Right-sided ear pain symptoms for 6 months. EXAM: CT MAXILLOFACIAL WITH CONTRAST TECHNIQUE: Multidetector CT imaging of the maxillofacial structures was performed with intravenous contrast. Multiplanar CT image reconstructions were also generated. CONTRAST:  25mL OMNIPAQUE IOHEXOL 300 MG/ML  SOLN COMPARISON:  None. FINDINGS: Osseous: Negative for destructive process. Orbits: No evidence of mass or inflammation Sinuses: Clear Soft tissues: Mass in the right tonsillar fossa and glossotonsillar sulcus with bilateral root of tongue invasion, 4.8 cm maximum on axial slices. Multiple ipsilateral necrotic lymph nodes in the right jugular chain with the largest discrete node measuring 3.2 cm craniocaudal. Based on the nodal margins there may be extracapsular disease. Limited intracranial: No evidence of intracranial spread of disease. IMPRESSION: Findings of right tonsil carcinoma with bilateral tongue  invasion and at least 2 ipsilateral malignant nodes. Electronically Signed   By: Monte Fantasia M.D.   On: 01/11/2020 10:00      IMPRESSION/PLAN:  This is a delightful patient with head and neck cancer. I recommend radiotherapy for this patient. Anticipate concurrent chemotherapy.  We discussed the potential risks, benefits, and side effects of radiotherapy. We talked in detail about acute and late effects. We discussed that some of the most bothersome  acute effects may be mucositis, dysgeusia, salivary changes, skin irritation, hair loss, dehydration, weight loss and fatigue. We talked about late effects which include but are not necessarily limited to dysphagia, vessel damage, hypothyroidism, nerve injury, spinal cord injury, xerostomia, trismus, and neck edema. No guarantees of treatment were given. A consent form was signed and placed in the patient's medical record. The patient is enthusiastic about proceeding with treatment. I look forward to participating in the patient's care.    Simulation (treatment planning) will take place after release by dentistry.  We also discussed that the treatment of head and neck cancer is a multidisciplinary process to maximize treatment outcomes and quality of life. For this reason the following referrals have been or will be made:   Medical oncology to discuss chemotherapy    Dentistry for dental evaluation, possible extractions in the radiation fields, and /or advice on reducing risk of cavities, osteoradionecrosis, or other oral issues.   Nutritionist for nutrition support during and after treatment.   Speech language pathology for swallowing and/or speech therapy.   Social work for social support.    Physical therapy due to risk of lymphedema in neck and deconditioning.   Patient has irregular heart rhythm - these may be skipped beats. Informed daughter and patient to discuss w/ his PCP.   I strongly recommend a PEG tube to aid with nutrition during therapy. He is reluctant, but will discuss more with med/onc.  On date of service, in total, I spent 65 minutes on this encounter. Patient was seen in person.  __________________________________________   [name], MD   This document serves as a record of services personally performed by [name], MD. It was created on her behalf by Wilburn Mylar, a trained medical scribe. The creation of this record is based on the scribe's personal  observations and the provider's statements to them. This document has been checked and approved by the attending provider.

## 2020-01-27 NOTE — Progress Notes (Signed)
START ON PATHWAY REGIMEN - Head and Neck     A cycle is every 21 days:     Cisplatin   **Always confirm dose/schedule in your pharmacy ordering system**  Patient Characteristics: Oral Cavity, Preoperative or Nonsurgical Candidate, Stage III - IVB Disease Classification: Oral Cavity AJCC T Category: T3 AJCC M Category: M0 AJCC N Category: cN2a AJCC 8 Stage Grouping: IVA Therapeutic Status: Preoperative or Nonsurgical Candidate Intent of Therapy: Curative Intent, Discussed with Patient

## 2020-01-27 NOTE — Progress Notes (Signed)
Per MD request, RN placed call to Dr. Tamsen [facility]ers [facility] to schedule baseline hearing assessment prior to receiving chemotherapy. RN spoke with Ebony Hail receptionist who states she will call pt to arrange appointment.

## 2020-01-27 NOTE — Assessment & Plan Note (Addendum)
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  Two right cervical lymph nodes metastases measuring 2.5 cm and 2.1 cm [id number] stage IVa HPV status: Pending  Counseling: I discussed with the patient extensively about the etiology and pathogenesis of head and neck cancer and extent of disease involvement in his tonsil extending to base of tongue and the cervical lymph nodes. Even though it is stage IV A, it is considered to be curable cancer.  Recommendation for treatment: Concurrent chemoradiation with cisplatin. Cisplatin counseling: I discussed extensively the risks and benefits of cisplatin including the risk of cytopenias, risk of infection, hearing loss, neuropathy, hair loss, mucositis, nausea and vomiting or even death.  Plan: 1.  Radiation consultation 2. request dental evaluation from Dr. Dorothyann Gibbs 3.  Port placement 4.  NG tube 5.  Hearing evaluation 6.  Nutritionist evaluation Start chemotherapy concurrently with radiation.  Return to clinic the start of chemotherapy

## 2020-01-28 ENCOUNTER — Encounter (HOSPITAL_COMMUNITY): Payer: Self-pay | Admitting: Dentistry

## 2020-01-28 ENCOUNTER — Ambulatory Visit (HOSPITAL_COMMUNITY): Payer: Self-pay | Admitting: Dentistry

## 2020-01-28 ENCOUNTER — Other Ambulatory Visit: Payer: Self-pay | Admitting: Radiation Oncology

## 2020-01-28 ENCOUNTER — Telehealth: Payer: Self-pay | Admitting: Hematology and Oncology

## 2020-01-28 VITALS — BP 178/79 | HR 98 | Temp 98.4°F

## 2020-01-28 DIAGNOSIS — Z01818 Encounter for other preprocedural examination: Secondary | ICD-10-CM

## 2020-01-28 DIAGNOSIS — M264 Malocclusion, unspecified: Secondary | ICD-10-CM

## 2020-01-28 DIAGNOSIS — K0530 Chronic periodontitis, unspecified: Secondary | ICD-10-CM

## 2020-01-28 DIAGNOSIS — M2632 Excessive spacing of fully erupted teeth: Secondary | ICD-10-CM

## 2020-01-28 DIAGNOSIS — R252 Cramp and spasm: Secondary | ICD-10-CM

## 2020-01-28 DIAGNOSIS — K083 Retained dental root: Secondary | ICD-10-CM

## 2020-01-28 DIAGNOSIS — K029 Dental caries, unspecified: Secondary | ICD-10-CM

## 2020-01-28 DIAGNOSIS — K08409 Partial loss of teeth, unspecified cause, unspecified class: Secondary | ICD-10-CM

## 2020-01-28 DIAGNOSIS — F40232 Fear of other medical care: Secondary | ICD-10-CM

## 2020-01-28 DIAGNOSIS — C099 Malignant neoplasm of tonsil, unspecified: Secondary | ICD-10-CM

## 2020-01-28 DIAGNOSIS — K06010 Localized gingival recession, unspecified: Secondary | ICD-10-CM

## 2020-01-28 DIAGNOSIS — K0401 Reversible pulpitis: Secondary | ICD-10-CM

## 2020-01-28 DIAGNOSIS — K0889 Other specified disorders of teeth and supporting structures: Secondary | ICD-10-CM

## 2020-01-28 DIAGNOSIS — K045 Chronic apical periodontitis: Secondary | ICD-10-CM

## 2020-01-28 DIAGNOSIS — K031 Abrasion of teeth: Secondary | ICD-10-CM

## 2020-01-28 DIAGNOSIS — K036 Deposits [accretions] on teeth: Secondary | ICD-10-CM

## 2020-01-28 MED ORDER — SODIUM FLUORIDE 1.1 % DT CREA: TOPICAL_CREAM | DENTAL | 99 refills | Status: DC

## 2020-01-28 NOTE — Progress Notes (Signed)
Dental Form with Estimates of Radiation Dose      Diagnosis:    ICD-10-CM   1. 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 If his bite will not change much, I would like to consider simulating him during the first week of August as I will be out of office the second week of August.  Please contact [name], MD, with patient's disposition after evaluation and/or dental treatment.

## 2020-01-28 NOTE — Progress Notes (Signed)
DENTAL CONSULTATION  Date of Consultation:  01/28/2020 Patient Name:   Omar Christian Date of Birth:   05/11/1956 Medical Record Number: [id number]  COVID 19 SCREENING: The patient does not symptoms concerning for COVID-19 infection (Including fever, chills, cough, or new SHORTNESS OF BREATH).    VITALS: BP (!) 178/79 (BP Location: Right Arm)   Pulse 98   Temp 98.4 F (36.9 C)    CHIEF COMPLAINT: Patient referred by Dr. Isidore Moos for dental consultation.  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  Patient with anticipated chemoradiation therapy.  Patient is now seen as part of a medically necessary prechemoradiation therapy dental protocol examination.  Patient currently is complaining of toothache symptoms coming from the upper right quadrant.  Patient points to the area of tooth numbers 4 and 5.  Patient describes having both sharp and dull pain coming from this area.  Patient indicates the pain reaches intensity of 5 out of 10 but is currently 2 out of 10 today.  Patient uses Tylenol to relieve the discomfort.  Patient has been having this pain for the past 2 to 3 months.  Patient also complains of soreness on the right side secondary to trismus symptoms and cancer involvement.  Patient has not seen a dentist in over 25 years.  Patient denies having any partial dentures.  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    PROBLEM LIST: Patient Active Problem List   Diagnosis Date Noted  . Tonsillar cancer ([name]) 01/27/2020  . 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 Unstable angina pectoris ([place]) 10/04/2015  . Progressive angina ([place])   . Abnormal findings on cardiac catheterization 09/30/2015  . CAD, multiple vessel 09/30/2015  . Abnormal findings diagnostic imaging of heart and coronary circulation 09/30/2015  . CAD in native artery 09/30/2015  . H/O echocardiogram 09/24/2015  . Abnormal myocardial perfusion study  09/24/2015  . Chest pain 09/03/2015  . Allergic rhinitis 08/27/2015  . H/O: gout 08/27/2015  . HLD (hyperlipidemia) 08/27/2015  . Benign hypertension 08/27/2015  . Acid reflux 08/27/2015  . Essential (primary) hypertension 08/27/2015  . H/O disease 08/27/2015    PMH: Past Medical History:  Diagnosis Date  . Abnormal findings on cardiac catheterization 09/30/15   [facility]  . Abnormal myocardial perfusion study 09/24/15   [facility]  . CAD, multiple vessel 09/30/15   per CATH @ [facility]/DR.PARACHOS  . Coronary artery disease   . GERD (gastroesophageal reflux disease)   . Gout   . H/O echocardiogram 09/24/15   [place]  . Hard of hearing   . History of pneumonia   . Hyperlipidemia   . Hypertension   . Progressive angina (HCC)     PSH: Past Surgical History:  Procedure Laterality Date  . CARDIAC CATHETERIZATION N/A 09/30/2015   Procedure: Left Heart Cath and Coronary Angiography;  Surgeon: [name], MD;  Location: [facility];  Service: Cardiovascular;  Laterality: N/A;  . CORONARY ARTERY BYPASS GRAFT N/A 10/08/2015   Procedure: CORONARY ARTERY BYPASS GRAFTING time 4 using left internal mammary and right greater saphenous vein harvested by endovein;  Surgeon: [name], MD;  Location: [facility];  Service: Open Heart Surgery;  Laterality: N/A;  . TEE WITHOUT CARDIOVERSION N/A 10/08/2015   Procedure: TRANSESOPHAGEAL ECHOCARDIOGRAM (TEE);  Surgeon: [name], MD;  Location: [facility];  Service: Open Heart Surgery;  Laterality: N/A;  . WISDOM TOOTH EXTRACTION  ALLERGIES: No Known Allergies  MEDICATIONS: Current Outpatient Medications  Medication Sig Dispense Refill  . acetaminophen (TYLENOL) 650 MG CR tablet Take 650 mg by mouth every 8 (eight) hours as needed for pain.    Marland Kitchen allopurinol (ZYLOPRIM) 100 MG tablet Take 1 tablet (100 mg total) by mouth daily. 30 tablet 12  . atorvastatin (LIPITOR) 20 MG tablet Take 1 tablet (20 mg total) by mouth  daily. 90 tablet 3  . fluticasone (FLONASE) 50 MCG/ACT nasal spray Reported on 11/15/2015 (Patient not taking: Reported on 01/26/2020)  3  . indomethacin (INDOCIN) 50 MG capsule Take 1 capsule (50 mg total) by mouth 3 (three) times daily with meals. As needed for foot pain (Patient not taking: Reported on 01/26/2020) 30 capsule 1  . oxyCODONE-acetaminophen (PERCOCET/ROXICET) 5-325 MG tablet Take 1 tablet by mouth every 4 (four) hours as needed. (Patient not taking: Reported on 01/27/2020)     No current facility-administered medications for this visit.    LABS: Lab Results  Component Value Date   WBC 5.6 01/11/2020   HGB 13.6 01/11/2020   HCT 41.2 01/11/2020   MCV 87.3 01/11/2020   PLT 154 01/11/2020      Component Value Date/Time   NA 140 01/11/2020 0823   NA 141 09/19/2019 0950   K 4.3 01/11/2020 0823   CL 105 01/11/2020 0823   CO2 22 01/11/2020 0823   GLUCOSE 140 (H) 01/11/2020 0823   BUN 19 01/11/2020 0823   BUN 9 09/19/2019 0950   CREATININE 0.93 01/11/2020 0823   CALCIUM 9.4 01/11/2020 0823   GFRNONAA >60 01/11/2020 0823   GFRAA >60 01/11/2020 0823   Lab Results  Component Value Date   INR 1.40 10/08/2015   INR 1.05 10/06/2015   No results found for: PTT  SOCIAL HISTORY: Social History   Socioeconomic History  . Marital status: Married    Spouse name: Not on file  . Number of children: Not on file  . Years of education: Not on file  . Highest education level: Not on file  Occupational History  . Not on file  Tobacco Use  . Smoking status: Never Smoker  . Smokeless tobacco: Never Used  Substance and Sexual Activity  . Alcohol use: No  . Drug use: No  . Sexual activity: Not Currently  Other Topics Concern  . Not on file  Social History Narrative  . Not on file   Social Determinants of Health   Financial Resource Strain: Low Risk   . Difficulty of Paying Living Expenses: Not very hard  Food Insecurity:   . Worried About Charity fundraiser in the Last  Year:   . Arboriculturist in the Last Year:   Transportation Needs: No Transportation Needs  . Lack of Transportation (Medical): No  . Lack of Transportation (Non-Medical): No  Physical Activity:   . Days of Exercise per Week:   . Minutes of Exercise per Session:   Stress:   . Feeling of Stress :   Social Connections:   . Frequency of Communication with Friends and Family:   . Frequency of Social Gatherings with Friends and Family:   . Attends Religious Services:   . Active Member of Clubs or Organizations:   . Attends Archivist Meetings:   Marland Kitchen Marital Status:   Intimate Partner Violence:   . Fear of Current or Ex-Partner:   . Emotionally Abused:   Marland Kitchen Physically Abused:   . Sexually Abused:  FAMILY HISTORY: Family History  Problem Relation Age of Onset  . Heart disease Mother   . Hypertension Mother   . Alcohol abuse Father   . Throat cancer Father   . Muscular dystrophy Brother   . Muscular dystrophy Brother     REVIEW OF SYSTEMS: Reviewed with the patient as per History of present illness. Psych: Patient does have dental phobia.    DENTAL HISTORY: CHIEF COMPLAINT: Patient referred by Dr. Isidore Moos for dental consultation.  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  Patient with anticipated chemoradiation therapy.  Patient is now seen as part of a medically necessary prechemoradiation therapy dental protocol examination.  Patient currently is complaining of toothache symptoms coming from the upper right quadrant.  Patient points to the area of tooth numbers 4 and 5.  Patient describes having both sharp and dull pain coming from this area.  Patient indicates the pain reaches intensity of 5 out of 10 but is currently 2 out of 10 today.  Patient uses Tylenol to relieve the discomfort.  Patient has been having this pain for the past 2 to 3 months.  Patient also complains of soreness on the right side  secondary to trismus symptoms and cancer involvement.  Patient has not seen a dentist in over 25 years.  Patient denies having any partial dentures.  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    DENTAL EXAMINATION: GENERAL: The patient is a well-developed, well-nourished male in no acute distress.  Patient has significant hearing deficit making communication somewhat difficult. HEAD AND NECK: The patient has significant right neck lymphadenopathy.  I do not palpate any left neck lymphadenopathy.  The patient has trismus symptoms on the right side.  Patient has a decreased maximum interincisal opening of 32 mm.   INTRAORAL EXAM: Patient has normal saliva.  I do not see any evidence of oral abscess formation. DENTITION: Patient with multiple missing teeth numbers 1, 2, 3, 14, 16, 17, 18, and 30.  There are retained roots in the area of tooth numbers 4, 5, 12, and 13.  Patient has multiple flexure lesions.  Multiple diastemas are noted. PERIODONTAL: Patient has chronic periodontitis with plaque and calculus accumulations, generalized gingival recession, and moderate to severe bone loss.  Tooth mobility is noted as per dental charting form.  Radiographic calculus is noted.   DENTAL CARIES/SUBOPTIMAL RESTORATIONS: Multiple dental caries are noted as per dental charting form. ENDODONTIC: Patient has acute pulpitis symptoms involving tooth numbers 4 and 5.  There is evidence of periapical pathology and radiolucency.  Patient has had a previous root canal therapy associated with tooth #6. CROWN AND BRIDGE: There are no crown or bridge restorations. PROSTHODONTIC: Patient denies having partial dentures. OCCLUSION: Patient has a poor occlusal scheme secondary to multiple missing teeth, multiple retained root segments, supra eruption and drifting of the unopposed teeth into the edentulous areas, multiple diastemas, lack replacing the missing teeth with dental prostheses.  RADIOGRAPHIC  INTERPRETATION: Orthopantogram was taken and supplemented with a full series dental radiographs.  The dental radiographs are suboptimal secondary to trismus symptoms and decreased maximum interincisal opening. There are multiple missing teeth.  There are multiple retained root segments.  There are multiple areas of periapical pathology and radiolucency.  Multiple dental caries are noted.  Multiple diastemas are noted.  There is supra eruption and drifting of the unopposed teeth into the edentulous areas.  There is moderate to severe bone loss  noted.  Radiographic calculus is noted.  1.  Squamous cell carcinoma of the right tonsil 2.  Prechemoradiation therapy dental protocol 3.  Chronic apical periodontitis 4.  Multiple retained root segments 5.  Dental caries and Fractured teeth 6.  Multiple flexure lesions 7.  Chronic periodontitis with bone loss 8.  Gingival recession 9.  Accretions 10.  Tooth mobility 11.  Multiple missing teeth 12.  Multiple diastemas 13.  Supra eruption and drifting of the unopposed teeth into the edentulous areas 14.  Poor occlusal scheme and malocclusion 15.  Dental phobia 16.  Trismus with decreased maximum interincisal opening of 32 mm.  PLAN/RECOMMENDATIONS: 1. I discussed the risks, benefits, and complications of various treatment options with the patient and his daughter in relationship to his medical and dental conditions, anticipated chemoradiation therapy, and chemoradiation therapy side effects to include xerostomia, radiation caries, trismus, mucositis, taste changes, gum and jawbone changes, and risk for infection and osteoradionecrosis. We discussed various treatment options to include no treatment, multiple extractions with alveoloplasty, pre-prosthetic surgery as indicated, periodontal therapy, dental restorations, root canal therapy, crown and bridge therapy, implant therapy, and replacement of missing teeth as indicated. The patient currently wishes to  proceed with multiple dental extractions with alveoloplasty and gross debridement of remaining dentition in the operating room with general anesthesia.  This has been scheduled for Wednesday, 02/04/2020 at 8:30 AM at [facility].  The patient will then return toDental Medicine for insertion of scatter protection devices prior to simulation on Friday, 02/06/2020 with Dr. Isidore Moos.  Patient will need at least 2 weeks of healing prior to start of chemoradiation therapy projected for 02/18/2020.  Patient will need to follow-up with a dentist of his choice for continued periodontal therapy, dental restorations, and evaluation for replacing the missing teeth as indicated after the chemoradiation therapy has been completed.  Currently the patient is scheduled for extraction of  tooth numbers 4, 5, 12, 13, 31, and 32.  The patient considered extraction of tooth numbers 23, 24, 25 and 26 but refused at this time.   2. Discussion of findings with medical team and coordination of future medical and dental care as needed.  I spent in excess of  120 minutes during the conduct of this consultation and >50% of this time involved direct face-to-face encounter for counseling and/or coordination of the patient's care.    [name], DDS

## 2020-01-28 NOTE — Telephone Encounter (Signed)
Scheduled per 7/27 los. Called and spoke with wife and daughter, confirmed added appts

## 2020-01-28 NOTE — Patient Instructions (Addendum)

## 2020-01-29 ENCOUNTER — Encounter (HOSPITAL_COMMUNITY): Payer: Self-pay

## 2020-01-29 ENCOUNTER — Other Ambulatory Visit: Payer: Self-pay | Admitting: *Deleted

## 2020-01-29 ENCOUNTER — Telehealth: Payer: Self-pay | Admitting: Licensed Clinical Social Worker

## 2020-01-29 NOTE — Telephone Encounter (Signed)
[place] Work  Holiday representative attempted to contact patient by phone  to offer support and assess for needs in patient with newly diagnosed tonsillar cancer. No answer. Left VM with direct contact information.        Omar Christian, [place], [place] Worker [facility]

## 2020-01-29 NOTE — Patient Outreach (Addendum)
[facility]) Care Management  01/29/2020  Shain Pauwels [birth year] [id number]   Patient's case discussed at  Multidisciplinary Case Discussion (MCD) with [facility] Care Management Team and with Earma Reading at [facility].   RNCM also advised team of pending oral surgery on 02/04/2020.  Discussion recommendations: RNCM will continue to follow up for other diagnosis (oral cancer) care coordination.  Jenny Reichmann will advise RNCM if authorization is not in place for upcoming oral surgery. Plan: Update team as needed.     Raquel Sayres H. Annia Friendly, BSN, [place] Management [facility] Telephonic CM Phone: [phone] Fax: [phone]

## 2020-01-29 NOTE — Patient Instructions (Addendum)
DUE TO COVID-19 ONLY ONE VISITOR ARE ALLOWED TO COME WITH YOU AND STAY IN THE WAITING ROOM ONLY DURING PRE OP AND PROCEDURE. THEN TWO VISITORS MAY VISIT WITH YOU IN YOUR PRIVATE ROOM DURING VISITING HOURS ONLY!! (10AM-8PM)   COVID SWAB TESTING COMPLETED ON:   Tuesday, Aug. 3, 2021 at 9:00 AM [address] [place], [place] [address]  (Must self quarantine after testing. Follow instructions on handout.)       Your procedure is scheduled on: Wednesday, Aug. 4, 2021   Report to [facility] Main  Entrance    Report to admitting at 6:30 AM   Call this number if you have problems the morning of surgery [phone]   Do not eat food or drink liquids :After Midnight.     Oral Hygiene is also important to reduce your risk of infection.                                    Remember - BRUSH YOUR TEETH THE MORNING OF SURGERY WITH YOUR REGULAR TOOTHPASTE   Do NOT smoke after Midnight   Take these medicines the morning of surgery with A SIP OF WATER: Allopurinol, Atorvastatin                               You may not have any metal on your body including jewelry, and body piercings             Do not wear lotions, powders, perfumes/cologne, or deodorant                          Men may shave face and neck.   Do not bring valuables to the hospital. [place].   Contacts, dentures or bridgework may not be worn into surgery.    Patients discharged the day of surgery will not be allowed to drive home.   Special Instructions: Bring a copy of your healthcare power of attorney and living will documents         the day of surgery if you haven't scanned them in before.              Please read over the following fact sheets you were given: IF YOU HAVE QUESTIONS ABOUT YOUR PRE OP INSTRUCTIONS PLEASE CALL [phone] [place] - Preparing for Surgery Before surgery, you can play an important role.  Because skin is not sterile, your skin needs  to be as free of germs as possible.  You can reduce the number of germs on your skin by washing with CHG (chlorahexidine gluconate) soap before surgery.  CHG is an antiseptic cleaner which kills germs and bonds with the skin to continue killing germs even after washing. Please DO NOT use if you have an allergy to CHG or antibacterial soaps.  If your skin becomes reddened/irritated stop using the CHG and inform your nurse when you arrive at Short Stay. Do not shave (including legs and underarms) for at least 48 hours prior to the first CHG shower.  You may shave your face/neck.  Please follow these instructions carefully:  1.  Shower with CHG Soap the night before surgery and the  morning of surgery.  2.  If  you choose to wash your hair, wash your hair first as usual with your normal  shampoo.  3.  After you shampoo, rinse your hair and body thoroughly to remove the shampoo.                             4.  Use CHG as you would any other liquid soap.  You can apply chg directly to the skin and wash.  Gently with a scrungie or clean washcloth.  5.  Apply the CHG Soap to your body ONLY FROM THE NECK DOWN.   Do   not use on face/ open                           Wound or open sores. Avoid contact with eyes, ears mouth and   genitals (private parts).                       Wash face,  Genitals (private parts) with your normal soap.             6.  Wash thoroughly, paying special attention to the area where your    surgery  will be performed.  7.  Thoroughly rinse your body with warm water from the neck down.  8.  DO NOT shower/wash with your normal soap after using and rinsing off the CHG Soap.                9.  Pat yourself dry with a clean towel.            10.  Wear clean pajamas.            11.  Place clean sheets on your bed the night of your first shower and do not  sleep with pets. Day of Surgery : Do not apply any lotions/deodorants the morning of surgery.  Please wear clean clothes to the  hospital/surgery center.  FAILURE TO FOLLOW THESE INSTRUCTIONS MAY RESULT IN THE CANCELLATION OF YOUR SURGERY  PATIENT SIGNATURE_________________________________  NURSE SIGNATURE__________________________________  ________________________________________________________________________

## 2020-01-29 NOTE — Progress Notes (Signed)
COVID Vaccine Completed: Yes Date COVID Vaccine completed: 11/09/2019, 12/10/2019 COVID vaccine manufacturer:  [place]    PCP - D. Chrismon PA Cardiologist -   Chest x-ray - greater than 1 year EKG - 02/03/2020 in epic (pre op) Stress Test - greater than 2 years ECHO - greater than 2 years Cardiac Cath - greater than 2 years  Sleep Study -  CPAP -   Fasting Blood Sugar -  Checks Blood Sugar _____ times a day  Blood Thinner Instructions: Aspirin Instructions: Last Dose:  Anesthesia review: CAD, HTN, CABG   Patient denies shortness of breath, fever, cough and chest pain at PAT appointment   Patient verbalized understanding of instructions that were given to them at the PAT appointment. Patient was also instructed that they will need to review over the PAT instructions again at home before surgery.

## 2020-01-30 ENCOUNTER — Encounter: Payer: Self-pay | Admitting: General Practice

## 2020-01-30 NOTE — Progress Notes (Signed)
[place] CSW Progress Notes  Call from daughter, [name] [phone]).  Concerned about her father/this patient.  Has limited health literacy, hard of hearing so cannot communicate well by phone, lives w wife who is elderly but very supportive/engaged.  States that getting insurance authorizations for needed tests/scans has been difficult and patient has been paying out of pocket for multiple medical needs. Asked about possibility of applying for Medicaid - advised that this will need to be done at DSS. Also provided contact information for Patient [place] as patient has experienced delays in care due to insurance authorization issues and coverage issues.  Called patient and spoke w wife - he gave permission for me to speak w wife and does not communicate well on phone.   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  Gave wife information on COVID housing relief funding which may be available to help them w mortgage. His loss of income appears to be related to [place] pandemic.   Also referred to [facility] for [employer].  Expresses that he would like to access any government benefits to replace lost income .  Is not currently receiving any government benefits, would like to apply.  Referred to [facility] for help w this, may also need to consider taking Social Security/retirement benefits early.  Will follow up in [place] if he is scheduled for this.  Will also be followed by CSW [place] as she supports Dr Geralyn Flash patients.  Edwyna Shell, LCSW Clinical Social Worker Phone:  [phone] Cell:  [phone]

## 2020-01-31 ENCOUNTER — Encounter: Payer: Self-pay | Admitting: Radiation Oncology

## 2020-01-31 DIAGNOSIS — C098 Malignant neoplasm of overlapping sites of tonsil: Secondary | ICD-10-CM | POA: Insufficient documentation

## 2020-02-02 ENCOUNTER — Other Ambulatory Visit: Payer: Self-pay | Admitting: Hematology and Oncology

## 2020-02-02 ENCOUNTER — Telehealth: Payer: Self-pay

## 2020-02-02 DIAGNOSIS — C099 Malignant neoplasm of tonsil, unspecified: Secondary | ICD-10-CM

## 2020-02-02 MED ORDER — ONDANSETRON HCL 8 MG PO TABS: 8.0000 mg | ORAL_TABLET | Freq: Two times a day (BID) | ORAL | 1 refills | Status: DC | PRN

## 2020-02-02 MED ORDER — LORAZEPAM 0.5 MG PO TABS: 0.5000 mg | ORAL_TABLET | Freq: Every evening | ORAL | 0 refills | Status: DC | PRN

## 2020-02-02 MED ORDER — PROCHLORPERAZINE MALEATE 10 MG PO TABS: 10.0000 mg | ORAL_TABLET | Freq: Four times a day (QID) | ORAL | 1 refills | Status: DC | PRN

## 2020-02-02 MED ORDER — LIDOCAINE-PRILOCAINE 2.5-2.5 % EX CREA: TOPICAL_CREAM | CUTANEOUS | 3 refills | Status: DC

## 2020-02-02 MED ORDER — DEXAMETHASONE 4 MG PO TABS: 8.0000 mg | ORAL_TABLET | Freq: Every day | ORAL | 0 refills | Status: DC

## 2020-02-02 NOTE — Telephone Encounter (Signed)
Called pt to discuss medications that were called in today by Dr Lindi Adie. Pt did not answer. LVM to return call to [phone].

## 2020-02-02 NOTE — Telephone Encounter (Signed)
-----   Message from [name], MD sent at 02/02/2020  9:45 AM EDT ----- Regarding: RE: anti-nausea med-EMLA Sent VG ----- Message ----- From: Lennie Odor, LPN Sent: 03/05/7168   8:56 AM EDT To: [name], MD Subject: FW: anti-nausea med-EMLA                       What would you like ordered for this pt?   Thanks, LG ----- Message ----- From: Jesse Fall, RN Sent: 01/30/2020   3:16 PM EDT To: [facility] Subject: anti-nausea med-EMLA                           Pt may need anti-nausea med & EMLA. Thanks, [employer]

## 2020-02-03 ENCOUNTER — Encounter (HOSPITAL_COMMUNITY)
Admission: RE | Admit: 2020-02-03 | Discharge: 2020-02-03 | Disposition: A | Discharge status: Home / Self Care | Payer: 59 | Source: Ambulatory Visit | Attending: Dentistry | Admitting: Dentistry

## 2020-02-03 ENCOUNTER — Encounter (HOSPITAL_COMMUNITY): Payer: Self-pay

## 2020-02-03 ENCOUNTER — Inpatient Hospital Stay: Payer: 59 | Attending: Hematology and Oncology

## 2020-02-03 ENCOUNTER — Other Ambulatory Visit (HOSPITAL_COMMUNITY)
Admission: RE | Admit: 2020-02-03 | Discharge: 2020-02-03 | Disposition: A | Discharge status: Home / Self Care | Payer: 59 | Source: Ambulatory Visit | Attending: Dentistry | Admitting: Dentistry

## 2020-02-03 ENCOUNTER — Telehealth: Payer: Self-pay | Admitting: Family Medicine

## 2020-02-03 ENCOUNTER — Other Ambulatory Visit: Payer: Self-pay

## 2020-02-03 ENCOUNTER — Encounter (HOSPITAL_COMMUNITY): Payer: Self-pay | Admitting: Dentistry

## 2020-02-03 DIAGNOSIS — Z20822 Contact with and (suspected) exposure to covid-19: Secondary | ICD-10-CM | POA: Diagnosis not present

## 2020-02-03 DIAGNOSIS — Z01818 Encounter for other preprocedural examination: Secondary | ICD-10-CM | POA: Diagnosis present

## 2020-02-03 HISTORY — DX: Pneumonia, unspecified organism: J18.9

## 2020-02-03 HISTORY — DX: Malignant neoplasm of tonsil, unspecified: C09.9

## 2020-02-03 LAB — BASIC METABOLIC PANEL
Anion gap: 9 (ref 5–15)
BUN: 14 mg/dL (ref 8–23)
CO2: 27 mmol/L (ref 22–32)
Calcium: 9.2 mg/dL (ref 8.9–10.3)
Chloride: 102 mmol/L (ref 98–111)
Creatinine, Ser: 0.95 mg/dL (ref 0.61–1.24)
GFR calc Af Amer: >60 mL/min (ref >60)
GFR calc non Af Amer: >60 mL/min (ref >60)
Glucose, Bld: 218 mg/dL — ABNORMAL HIGH (ref 70–99)
Potassium: 4.7 mmol/L (ref 3.5–5.1)
Sodium: 138 mmol/L (ref 135–145)

## 2020-02-03 LAB — CBC
HCT: 37.1 % — ABNORMAL LOW (ref 39.0–52.0)
Hemoglobin: 11.9 g/dL — ABNORMAL LOW (ref 13.0–17.0)
MCH: 28.5 pg (ref 26.0–34.0)
MCHC: 32.1 g/dL (ref 30.0–36.0)
MCV: 89 fL (ref 80.0–100.0)
Platelets: 180 10*3/uL (ref 150–400)
RBC: 4.17 MIL/uL — ABNORMAL LOW (ref 4.22–5.81)
RDW: 13.7 % (ref 11.5–15.5)
WBC: 5.9 10*3/uL (ref 4.0–10.5)
nRBC: 0 % (ref 0.0–0.2)

## 2020-02-03 LAB — SARS CORONAVIRUS 2 (TAT 6-24 HRS): SARS Coronavirus 2: NEGATIVE

## 2020-02-03 NOTE — Telephone Encounter (Signed)
[name] from [place] asked for the office to Fax last OV note from March 2021 to [phone]

## 2020-02-03 NOTE — Anesthesia Preprocedure Evaluation (Addendum)
Anesthesia Evaluation  Patient identified by MRN, date of birth, ID band Patient awake    Reviewed: Allergy & Precautions, NPO status , Patient's Chart, lab work & pertinent test results  History of Anesthesia Complications (+) DIFFICULT AIRWAY and history of anesthetic complications (right tonsillar cancer)  Airway Mallampati: IV  TM Distance: <3 FB Neck ROM: Limited  Mouth opening: Limited Mouth Opening  Dental  (+) Teeth Intact, Dental Advisory Given   Pulmonary    breath sounds clear to auscultation       Cardiovascular hypertension, + CAD and + CABG (2017)   Rhythm:Regular Rate:Normal  TEE 2017 - Left ventricle: Hypertrophy was noted. Systolic function was normal. The estimated ejection fraction was in the range of 55%to 65%.  - Aortic valve: There was mild regurgitation originating from the central coaptation point.  - Mitral valve: There was mild regurgitation directed centrally  LHC 2017 (prior to CABG) Ost LAD to Prox LAD lesion, 95% stenosed. Prox Cx lesion, 95% stenosed. Ost 1st Mrg to 1st Mrg lesion, 80% stenosed. Mid Cx lesion, 95% stenosed. Mid LAD to Dist LAD lesion, 50% stenosed. Dist LAD lesion, 50% stenosed. 1. Severe three-vessel coronary artery disease with high-grade 95% stenosis proximal LAD, 80% proximal large dominant left circumflex, 80% OM1, 95% mid left circumflex. 2. Preserved left ventricular function with anteroapical hypokinesis    Neuro/Psych negative neurological ROS  negative psych ROS   GI/Hepatic Neg liver ROS, GERD  ,  Endo/Other  negative endocrine ROS  Renal/GU negative Renal ROS     Musculoskeletal negative musculoskeletal ROS (+)   Abdominal   Peds  Hematology negative hematology ROS (+)   Anesthesia Other Findings   Reproductive/Obstetrics                          Anesthesia Physical Anesthesia Plan  ASA: III  Anesthesia Plan:  General   Post-op Pain Management:    Induction: Intravenous  PONV Risk Score and Plan: 3 and Ondansetron, Midazolam and Dexamethasone  Airway Management Planned: Nasal ETT, Awake Intubation Planned and Fiberoptic Intubation Planned  Additional Equipment: None  Intra-op Plan:   Post-operative Plan: Post-operative intubation/ventilation  Informed Consent:   Plan Discussed with: CRNA  Anesthesia Plan Comments: (Plan for awake fiberoptic nasal intubation.)     Anesthesia Quick Evaluation

## 2020-02-03 NOTE — Progress Notes (Signed)
Patient's daughter, [name], is aware that surgery has been moved to [facility] and to arrive at [place].

## 2020-02-03 NOTE — Progress Notes (Signed)
BMP sent to Dr. Enrique Sack to review.

## 2020-02-03 NOTE — Telephone Encounter (Signed)
Called [place], no answer. Left VM advising they should be able to see pt's last visit note but if not to please call back. TNP

## 2020-02-03 NOTE — Progress Notes (Addendum)
COVID Vaccine Completed: Yes Date COVID Vaccine completed: 11/09/2019, 12/10/2019 COVID vaccine manufacturer:  [place]    PCP - D. Chrismon PA Cardiologist - Last saw cardiology in 2018  Chest x-ray - greater than 1 year EKG - 02/03/2020 in epic (pre op) Stress Test - greater than 2 years ECHO - greater than 2 years Cardiac Cath - greater than 2 years  Sleep Study - N/A CPAP - N/A  Fasting Blood Sugar - N/A Checks Blood Sugar _____ times a day  Blood Thinner Instructions:  N/A Aspirin Instructions: N/A Last Dose:  Anesthesia review: CAD, HTN, CABG   Dr. Kalman Shan came to PAT appt and physically examined the patient.  Patient denies shortness of breath, fever, cough and chest pain at PAT appointment.  Says no SOB or chest pain since CABG.  No difficulty swallowing or drinking fluids.   Patient verbalized understanding of instructions that were given to them at the PAT appointment. Patient was also instructed that they will need to review over the PAT instructions again at home before surgery.

## 2020-02-04 ENCOUNTER — Ambulatory Visit (HOSPITAL_COMMUNITY): Payer: 59 | Admitting: Vascular Surgery

## 2020-02-04 ENCOUNTER — Encounter (HOSPITAL_COMMUNITY): Payer: Self-pay | Admitting: Dentistry

## 2020-02-04 ENCOUNTER — Encounter (HOSPITAL_COMMUNITY)
Admission: RE | Disposition: A | Discharge status: Home / Self Care | Payer: Self-pay | Source: Home / Self Care | Attending: Internal Medicine

## 2020-02-04 ENCOUNTER — Observation Stay (HOSPITAL_COMMUNITY)
Admission: RE | Admit: 2020-02-04 | Discharge: 2020-02-05 | Disposition: A | Discharge status: Home / Self Care | Payer: 59 | Attending: Internal Medicine | Admitting: Internal Medicine

## 2020-02-04 DIAGNOSIS — K0530 Chronic periodontitis, unspecified: Secondary | ICD-10-CM

## 2020-02-04 DIAGNOSIS — K045 Chronic apical periodontitis: Secondary | ICD-10-CM

## 2020-02-04 DIAGNOSIS — K089 Disorder of teeth and supporting structures, unspecified: Secondary | ICD-10-CM | POA: Diagnosis not present

## 2020-02-04 DIAGNOSIS — C099 Malignant neoplasm of tonsil, unspecified: Secondary | ICD-10-CM | POA: Diagnosis present

## 2020-02-04 DIAGNOSIS — K036 Deposits [accretions] on teeth: Secondary | ICD-10-CM

## 2020-02-04 DIAGNOSIS — I2510 Atherosclerotic heart disease of native coronary artery without angina pectoris: Secondary | ICD-10-CM | POA: Insufficient documentation

## 2020-02-04 DIAGNOSIS — Z79899 Other long term (current) drug therapy: Secondary | ICD-10-CM | POA: Insufficient documentation

## 2020-02-04 DIAGNOSIS — I10 Essential (primary) hypertension: Secondary | ICD-10-CM | POA: Insufficient documentation

## 2020-02-04 DIAGNOSIS — K0889 Other specified disorders of teeth and supporting structures: Secondary | ICD-10-CM

## 2020-02-04 DIAGNOSIS — Z951 Presence of aortocoronary bypass graft: Secondary | ICD-10-CM | POA: Diagnosis not present

## 2020-02-04 DIAGNOSIS — R739 Hyperglycemia, unspecified: Secondary | ICD-10-CM | POA: Diagnosis present

## 2020-02-04 DIAGNOSIS — K0401 Reversible pulpitis: Secondary | ICD-10-CM

## 2020-02-04 DIAGNOSIS — K029 Dental caries, unspecified: Secondary | ICD-10-CM

## 2020-02-04 HISTORY — PX: MULTIPLE EXTRACTIONS WITH ALVEOLOPLASTY: SHX5342

## 2020-02-04 LAB — HIV ANTIBODY (ROUTINE TESTING W REFLEX): HIV Screen 4th Generation wRfx: NONREACTIVE

## 2020-02-04 LAB — HEMOGLOBIN A1C
Hgb A1c MFr Bld: 5.8 % — ABNORMAL HIGH (ref 4.8–5.6)
Mean Plasma Glucose: 119.76 mg/dL

## 2020-02-04 LAB — GLUCOSE, CAPILLARY
Glucose-Capillary: 154 mg/dL — ABNORMAL HIGH (ref 70–99)
Glucose-Capillary: 204 mg/dL — ABNORMAL HIGH (ref 70–99)

## 2020-02-04 SURGERY — MULTIPLE EXTRACTION WITH ALVEOLOPLASTY
Anesthesia: General

## 2020-02-04 MED ORDER — ACETAMINOPHEN 325 MG PO TABS
650.0000 mg | ORAL_TABLET | Freq: Four times a day (QID) | ORAL | Status: DC | PRN
  Administered 2020-02-04: 650 mg via ORAL
  Filled 2020-02-04: qty 2

## 2020-02-04 MED ORDER — INSULIN ASPART 100 UNIT/ML ~~LOC~~ SOLN
0.0000 [IU] | Freq: Three times a day (TID) | SUBCUTANEOUS | Status: DC
  Administered 2020-02-04: 3 [IU] via SUBCUTANEOUS

## 2020-02-04 MED ORDER — THROMBIN 5000 UNITS EX SOLR
CUTANEOUS | Status: AC
  Filled 2020-02-04: qty 5000

## 2020-02-04 MED ORDER — LIDOCAINE HCL URETHRAL/MUCOSAL 2 % EX GEL
CUTANEOUS | Status: DC | PRN
  Administered 2020-02-04: 1 via TOPICAL

## 2020-02-04 MED ORDER — ONDANSETRON HCL 4 MG/2ML IJ SOLN: 4.0000 mg | Freq: Four times a day (QID) | INTRAMUSCULAR | Status: DC | PRN

## 2020-02-04 MED ORDER — BUPIVACAINE-EPINEPHRINE 0.5% -1:200000 IJ SOLN
INTRAMUSCULAR | Status: DC | PRN
  Administered 2020-02-04: 3.6 mL

## 2020-02-04 MED ORDER — ACETAMINOPHEN 650 MG RE SUPP: 650.0000 mg | Freq: Four times a day (QID) | RECTAL | Status: DC | PRN

## 2020-02-04 MED ORDER — LIDOCAINE-EPINEPHRINE 2 %-1:100000 IJ SOLN
INTRAMUSCULAR | Status: DC | PRN
  Administered 2020-02-04: 8.5 mL

## 2020-02-04 MED ORDER — ALLOPURINOL 100 MG PO TABS
100.0000 mg | ORAL_TABLET | Freq: Every day | ORAL | Status: DC
  Filled 2020-02-04: qty 1

## 2020-02-04 MED ORDER — ONDANSETRON HCL 4 MG/2ML IJ SOLN
INTRAMUSCULAR | Status: DC | PRN
  Administered 2020-02-04: 4 mg via INTRAVENOUS

## 2020-02-04 MED ORDER — LIDOCAINE-EPINEPHRINE 2 %-1:100000 IJ SOLN
INTRAMUSCULAR | Status: AC
  Filled 2020-02-04: qty 10.2

## 2020-02-04 MED ORDER — HYDROCODONE-ACETAMINOPHEN 7.5-325 MG/15ML PO SOLN: 10.0000 mL | ORAL | Status: DC | PRN

## 2020-02-04 MED ORDER — ORAL CARE MOUTH RINSE: 15.0000 mL | Freq: Once | OROMUCOSAL | Status: AC

## 2020-02-04 MED ORDER — MIDAZOLAM HCL 2 MG/2ML IJ SOLN
INTRAMUSCULAR | Status: AC
  Filled 2020-02-04: qty 2

## 2020-02-04 MED ORDER — LIDOCAINE 2% (20 MG/ML) 5 ML SYRINGE
INTRAMUSCULAR | Status: AC
  Filled 2020-02-04: qty 10

## 2020-02-04 MED ORDER — PHENYLEPHRINE HCL-NACL 10-0.9 MG/250ML-% IV SOLN
INTRAVENOUS | Status: DC | PRN
  Administered 2020-02-04: 25 ug/min via INTRAVENOUS

## 2020-02-04 MED ORDER — OXYMETAZOLINE HCL 0.05 % NA SOLN
NASAL | Status: DC | PRN
  Administered 2020-02-04: 3 via NASAL

## 2020-02-04 MED ORDER — MIDAZOLAM HCL 5 MG/5ML IJ SOLN
INTRAMUSCULAR | Status: DC | PRN
  Administered 2020-02-04 (×2): 1 mg via INTRAVENOUS

## 2020-02-04 MED ORDER — PROCHLORPERAZINE MALEATE 10 MG PO TABS
10.0000 mg | ORAL_TABLET | Freq: Four times a day (QID) | ORAL | Status: DC | PRN
  Filled 2020-02-04: qty 1

## 2020-02-04 MED ORDER — 0.9 % SODIUM CHLORIDE (POUR BTL) OPTIME
TOPICAL | Status: DC | PRN
  Administered 2020-02-04: 1000 mL

## 2020-02-04 MED ORDER — LIDOCAINE 2% (20 MG/ML) 5 ML SYRINGE
INTRAMUSCULAR | Status: DC | PRN
  Administered 2020-02-04 (×2): 20 mg via INTRAVENOUS

## 2020-02-04 MED ORDER — DEXAMETHASONE SODIUM PHOSPHATE 10 MG/ML IJ SOLN
INTRAMUSCULAR | Status: AC
  Filled 2020-02-04: qty 1

## 2020-02-04 MED ORDER — PROPOFOL 10 MG/ML IV BOLUS
INTRAVENOUS | Status: DC | PRN
  Administered 2020-02-04: 50 mg via INTRAVENOUS
  Administered 2020-02-04: 150 mg via INTRAVENOUS

## 2020-02-04 MED ORDER — DEXAMETHASONE SODIUM PHOSPHATE 10 MG/ML IJ SOLN
INTRAMUSCULAR | Status: DC | PRN
  Administered 2020-02-04: 5 mg via INTRAVENOUS

## 2020-02-04 MED ORDER — SODIUM FLUORIDE 1.1 % DT CREA: TOPICAL_CREAM | Freq: Every day | DENTAL | Status: DC

## 2020-02-04 MED ORDER — ONDANSETRON HCL 4 MG/2ML IJ SOLN
INTRAMUSCULAR | Status: AC
  Filled 2020-02-04: qty 2

## 2020-02-04 MED ORDER — LACTATED RINGERS IV SOLN: INTRAVENOUS | Status: DC

## 2020-02-04 MED ORDER — CEFAZOLIN SODIUM-DEXTROSE 2-4 GM/100ML-% IV SOLN
2.0000 g | Freq: Once | INTRAVENOUS | Status: AC
  Administered 2020-02-04: 2 g via INTRAVENOUS
  Filled 2020-02-04: qty 100

## 2020-02-04 MED ORDER — STERILE WATER FOR IRRIGATION IR SOLN
Status: DC | PRN
  Administered 2020-02-04: 1000 mL

## 2020-02-04 MED ORDER — ATORVASTATIN CALCIUM 10 MG PO TABS: 20.0000 mg | ORAL_TABLET | Freq: Every day | ORAL | Status: DC

## 2020-02-04 MED ORDER — MORPHINE SULFATE (PF) 2 MG/ML IV SOLN: 2.0000 mg | INTRAVENOUS | Status: DC | PRN

## 2020-02-04 MED ORDER — HEMOSTATIC AGENTS (NO CHARGE) OPTIME
TOPICAL | Status: DC | PRN
  Administered 2020-02-04: 1 via TOPICAL

## 2020-02-04 MED ORDER — BUPIVACAINE-EPINEPHRINE (PF) 0.5% -1:200000 IJ SOLN
INTRAMUSCULAR | Status: AC
  Filled 2020-02-04: qty 3.6

## 2020-02-04 MED ORDER — FENTANYL CITRATE (PF) 250 MCG/5ML IJ SOLN
INTRAMUSCULAR | Status: AC
  Filled 2020-02-04: qty 5

## 2020-02-04 MED ORDER — FENTANYL CITRATE (PF) 250 MCG/5ML IJ SOLN
INTRAMUSCULAR | Status: DC | PRN
  Administered 2020-02-04 (×3): 25 ug via INTRAVENOUS

## 2020-02-04 MED ORDER — DEXMEDETOMIDINE (PRECEDEX) IN NS 20 MCG/5ML (4 MCG/ML) IV SYRINGE
PREFILLED_SYRINGE | INTRAVENOUS | Status: AC
  Filled 2020-02-04: qty 10

## 2020-02-04 MED ORDER — ONDANSETRON HCL 4 MG PO TABS: 4.0000 mg | ORAL_TABLET | Freq: Four times a day (QID) | ORAL | Status: DC | PRN

## 2020-02-04 MED ORDER — GLYCOPYRROLATE 0.2 MG/ML IJ SOLN
INTRAMUSCULAR | Status: DC | PRN
  Administered 2020-02-04: .2 mg via INTRAVENOUS

## 2020-02-04 MED ORDER — DEXMEDETOMIDINE (PRECEDEX) IN NS 20 MCG/5ML (4 MCG/ML) IV SYRINGE
PREFILLED_SYRINGE | INTRAVENOUS | Status: DC | PRN
  Administered 2020-02-04: 12 ug via INTRAVENOUS
  Administered 2020-02-04: 8 ug via INTRAVENOUS
  Administered 2020-02-04: 12 ug via INTRAVENOUS
  Administered 2020-02-04: 4 ug via INTRAVENOUS
  Administered 2020-02-04: 12 ug via INTRAVENOUS
  Administered 2020-02-04: 4 ug via INTRAVENOUS
  Administered 2020-02-04: 8 ug via INTRAVENOUS

## 2020-02-04 MED ORDER — OXYCODONE-ACETAMINOPHEN 5-325 MG PO TABS: 1.0000 | ORAL_TABLET | ORAL | Status: DC | PRN

## 2020-02-04 MED ORDER — CHLORHEXIDINE GLUCONATE 0.12 % MT SOLN
15.0000 mL | Freq: Once | OROMUCOSAL | Status: AC
  Administered 2020-02-04: 15 mL via OROMUCOSAL
  Filled 2020-02-04: qty 15

## 2020-02-04 MED ORDER — LORAZEPAM 0.5 MG PO TABS: 0.5000 mg | ORAL_TABLET | Freq: Every evening | ORAL | Status: DC | PRN

## 2020-02-04 MED ORDER — PROPOFOL 10 MG/ML IV BOLUS
INTRAVENOUS | Status: AC
  Filled 2020-02-04: qty 40

## 2020-02-04 SURGICAL SUPPLY — 39 items
ALCOHOL 70% 16 OZ (MISCELLANEOUS) ×2 IMPLANT
ATTRACTOMAT 16X20 MAGNETIC DRP (DRAPES) ×2 IMPLANT
BANDAGE HEMOSTAT MRDH 4X4 STRL (MISCELLANEOUS) IMPLANT
BLADE SURG 15 STRL LF DISP TIS (BLADE) ×2 IMPLANT
BLADE SURG 15 STRL SS (BLADE) ×4
BNDG HEMOSTAT MRDH 4X4 STRL (MISCELLANEOUS)
CATH ROBINSON RED A/P 12FR (CATHETERS) ×2 IMPLANT
CATH ROBINSON RED A/P 16FR (CATHETERS) ×2 IMPLANT
CATH ROBINSON RED A/P 20FR (CATHETERS) ×2 IMPLANT
COVER SURGICAL LIGHT HANDLE (MISCELLANEOUS) ×2 IMPLANT
GAUZE 4X4 16PLY RFD (DISPOSABLE) ×2 IMPLANT
GAUZE PACKING FOLDED 2  STR (GAUZE/BANDAGES/DRESSINGS) ×1
GAUZE PACKING FOLDED 2 STR (GAUZE/BANDAGES/DRESSINGS) ×1 IMPLANT
GLOVE BIO SURGEON STRL SZ 6.5 (GLOVE) ×2 IMPLANT
GLOVE SURG ORTHO 8.0 STRL STRW (GLOVE) ×2 IMPLANT
GOWN STRL REUS W/ TWL LRG LVL3 (GOWN DISPOSABLE) ×1 IMPLANT
GOWN STRL REUS W/TWL 2XL LVL3 (GOWN DISPOSABLE) ×2 IMPLANT
GOWN STRL REUS W/TWL LRG LVL3 (GOWN DISPOSABLE) ×2
HEMOSTAT SURGICEL 2X14 (HEMOSTASIS) IMPLANT
KIT BASIN OR (CUSTOM PROCEDURE TRAY) ×2 IMPLANT
KIT TURNOVER KIT B (KITS) ×2 IMPLANT
MANIFOLD NEPTUNE II (INSTRUMENTS) ×2 IMPLANT
NEEDLE BLUNT 16X1.5 OR ONLY (NEEDLE) ×2 IMPLANT
NEEDLE DENTAL 27 LONG (NEEDLE) ×4 IMPLANT
NS IRRIG 1000ML POUR BTL (IV SOLUTION) ×2 IMPLANT
PACK EENT II TURBAN DRAPE (CUSTOM PROCEDURE TRAY) ×2 IMPLANT
SPONGE SURGIFOAM ABS GEL 100 (HEMOSTASIS) ×2 IMPLANT
SPONGE SURGIFOAM ABS GEL 12-7 (HEMOSTASIS) IMPLANT
SPONGE SURGIFOAM ABS GEL SZ50 (HEMOSTASIS) IMPLANT
SUCTION FRAZIER HANDLE 10FR (MISCELLANEOUS) ×1
SUCTION TUBE FRAZIER 10FR DISP (MISCELLANEOUS) ×1 IMPLANT
SUT CHROMIC 3 0 PS 2 (SUTURE) ×6 IMPLANT
SUT CHROMIC 4 0 P 3 18 (SUTURE) IMPLANT
SYR 50ML SLIP (SYRINGE) ×2 IMPLANT
TOWEL GREEN STERILE (TOWEL DISPOSABLE) ×2 IMPLANT
TUBE CONNECTING 12X1/4 (SUCTIONS) ×2 IMPLANT
WATER STERILE IRR 1000ML POUR (IV SOLUTION) ×2 IMPLANT
WATER TABLETS ICX (MISCELLANEOUS) ×2 IMPLANT
YANKAUER SUCT BULB TIP NO VENT (SUCTIONS) ×2 IMPLANT

## 2020-02-04 NOTE — H&P (Signed)
02/04/2020  Patient:            Omar Christian Date of Birth:  Dec 15, 1955 MRN:                [id number]   BP (!) 156/79   Pulse 76   Temp 98.1 F (36.7 C)   Resp 18   Ht 6\' 1"  (1.854 m)   Wt 102.5 kg   SpO2 98%   BMI 29.82 kg/m    Omar Christian is a 64 year old male with squamous cell carcinoma right tonsil.  Patient with anticipated chemoradiation therapy.  Patient was seen as part of a medically necessary prechemotherapy dental protocol examination.  Patient is currently scheduled for multiple dental extractions with alveoloplasty and gross debridement remaining dentition the operating room with general anesthesia.  Patient was originally scheduled at [facility] long operating room but was moved over to [facility] operating room in case we encountered airway concerns.  ENT physician is available for possible tracheostomy if indicated. Patient denies any acute medical problems or changes.  Please see note from Dr. Lindi Adie dated 01/27/20 to act as H&P for the dental operating room procedure.  [name], [place] CONSULT NOTE  Patient Care Team: Chrismon, Vickki Muff, PA as PCP - General (Family Medicine) [name], MD as Consulting Physician (Cardiothoracic Surgery) [name], MD as Consulting Physician (Cardiology) Serena Colonel, RN as [place] Management  CHIEF COMPLAINTS/PURPOSE OF CONSULTATION:  Newly diagnosed tonsil cancer  HISTORY OF PRESENTING ILLNESS:  Omar Christian 64 y.o. male is here because of recent diagnosis of tonsil cancer. He is referred by Dr. Benjamine Mola. Patient noted throat and ear pain, and a neck mass and swelling. He was seen in the [facility] ED on 01/11/20 where a CT showed a large right tonsillar carcinoma with bilateral tongue invasion and at least 2 ipsilateral malignant nodes. 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 PET scan on 01/26/20 showed the  right tonsillar mass extending to the bilateral tongue base, at least 4.9cm, with associated right cervical nodal metastases and no evidence of distant metastases. He has no history of tobacco use. He has a family history of throat cancer in his father. He presents to the clinic today for initial evaluation and discussion of treatment options.  He does not have any pain or difficulty swallowing.  He has hoarse voice.  He has swelling of the right side of the neck.  His daughter tells me that the symptoms have been getting worse since December 2020.  Prior to all of this he had bronchitis symptoms and he thought all of these were related to bronchitis.  I reviewed his records extensively and collaborated the history with the patient.   MEDICAL HISTORY:      Past Medical History:  Diagnosis Date  . Abnormal findings on cardiac catheterization 09/30/15   [facility]  . Abnormal myocardial perfusion study 09/24/15   [facility]  . CAD, multiple vessel 09/30/15   per CATH @ [facility]/DR.PARACHOS  . Coronary artery disease   . GERD (gastroesophageal reflux disease)   . Gout   . H/O echocardiogram 09/24/15   [facility]  . Hard of hearing   . History of pneumonia   . Hyperlipidemia   . Hypertension   . Progressive angina ([place])     SURGICAL HISTORY:      Past Surgical History:  Procedure Laterality Date  . CARDIAC CATHETERIZATION  N/A 09/30/2015   Procedure: Left Heart Cath and Coronary Angiography;  Surgeon: [name], MD;  Location: [place] CV LAB;  Service: Cardiovascular;  Laterality: N/A;  . CORONARY ARTERY BYPASS GRAFT N/A 10/08/2015   Procedure: CORONARY ARTERY BYPASS GRAFTING time 4 using left internal mammary and right greater saphenous vein harvested by endovein;  Surgeon: [name], MD;  Location: [place];  Service: Open Heart Surgery;  Laterality: N/A;  . TEE WITHOUT CARDIOVERSION N/A 10/08/2015   Procedure: TRANSESOPHAGEAL ECHOCARDIOGRAM (TEE);   Surgeon: [name], MD;  Location: [place];  Service: Open Heart Surgery;  Laterality: N/A;  . WISDOM TOOTH EXTRACTION      SOCIAL HISTORY: Social History   Socioeconomic History  . Marital status: Married    Spouse name: Not on file  . Number of children: Not on file  . Years of education: Not on file  . Highest education level: Not on file  Occupational History  . Not on file  Tobacco Use  . Smoking status: Never Smoker  . Smokeless tobacco: Never Used  Substance and Sexual Activity  . Alcohol use: No  . Drug use: No  . Sexual activity: Not Currently  Other Topics Concern  . Not on file  Social History Narrative  . Not on file   Social Determinants of Health      Financial Resource Strain: Low Risk   . Difficulty of Paying Living Expenses: Not very hard  Food Insecurity:   . Worried About Charity fundraiser in the Last Year:   . Arboriculturist in the Last Year:   Transportation Needs: No Transportation Needs  . Lack of Transportation (Medical): No  . Lack of Transportation (Non-Medical): No  Physical Activity:   . Days of Exercise per Week:   . Minutes of Exercise per Session:   Stress:   . Feeling of Stress :   Social Connections:   . Frequency of Communication with Friends and Family:   . Frequency of Social Gatherings with Friends and Family:   . Attends Religious Services:   . Active Member of Clubs or Organizations:   . Attends Archivist Meetings:   Marland Kitchen Marital Status:   Intimate Partner Violence:   . Fear of Current or Ex-Partner:   . Emotionally Abused:   Marland Kitchen Physically Abused:   . Sexually Abused:     FAMILY HISTORY:      Family History  Problem Relation Age of Onset  . Heart disease Mother   . Hypertension Mother   . Alcohol abuse Father   . Throat cancer Father   . Muscular dystrophy Brother   . Muscular dystrophy Brother     ALLERGIES:  has No Known Allergies.  MEDICATIONS:        Current  Outpatient Medications  Medication Sig Dispense Refill  . acetaminophen (TYLENOL) 650 MG CR tablet Take 650 mg by mouth every 8 (eight) hours as needed for pain.    Marland Kitchen allopurinol (ZYLOPRIM) 100 MG tablet Take 1 tablet (100 mg total) by mouth daily. 30 tablet 12  . atorvastatin (LIPITOR) 20 MG tablet Take 1 tablet (20 mg total) by mouth daily. 90 tablet 3  . fluticasone (FLONASE) 50 MCG/ACT nasal spray Reported on 11/15/2015 (Patient not taking: Reported on 01/26/2020)  3  . indomethacin (INDOCIN) 50 MG capsule Take 1 capsule (50 mg total) by mouth 3 (three) times daily with meals. As needed for foot pain (Patient not taking: Reported  on 01/26/2020) 30 capsule 1  . oxyCODONE-acetaminophen (PERCOCET/ROXICET) 5-325 MG tablet Take 1 tablet by mouth every 4 (four) hours as needed. (Patient not taking: Reported on 01/27/2020)     No current facility-administered medications for this visit.    REVIEW OF SYSTEMS:   Constitutional: Denies fevers, chills or abnormal night sweats Eyes: Denies blurriness of vision, double vision or watery eyes Ears, nose, mouth, throat, and face: Denies mucositis or sore throat Respiratory: Denies cough, dyspnea or wheezes Cardiovascular: Denies palpitation, chest discomfort or lower extremity swelling Gastrointestinal:  Denies nausea, heartburn or change in bowel habits Skin: Denies abnormal skin rashes Lymphatics: Denies new lymphadenopathy or easy bruising Neurological:Denies numbness, tingling or new weaknesses Behavioral/Psych: Mood is stable, no new changes  All other systems were reviewed with the patient and are negative.  PHYSICAL EXAMINATION: ECOG PERFORMANCE STATUS: 1 - Symptomatic but completely ambulatory      Vitals:   01/27/20 1432 01/27/20 1440  BP: (!) 164/74   Pulse: 66 66  Resp: 20 20  Temp: 98.3 F (36.8 C)   SpO2: 96%       Filed Weights   01/27/20 1432  Weight: (!) 226 lb (102.5 kg)    GENERAL:alert, no distress and  comfortable SKIN: skin color, texture, turgor are normal, no rashes or significant lesions EYES: normal, conjunctiva are pink and non-injected, sclera clear OROPHARYNX:no exudate, no erythema and lips, buccal mucosa, and tongue normal  NECK: supple, thyroid normal size, non-tender, without nodularity LYMPH:  no palpable lymphadenopathy in the cervical, axillary or inguinal LUNGS: clear to auscultation and percussion with normal breathing effort HEART: regular rate & rhythm and no murmurs and no lower extremity edema ABDOMEN:abdomen soft, non-tender and normal bowel sounds Musculoskeletal:no cyanosis of digits and no clubbing  PSYCH: alert & oriented x 3 with fluent speech NEURO: no focal motor/sensory deficits  LABORATORY DATA:  I have reviewed the data as listed Recent Labs       Lab Results  Component Value Date   WBC 5.6 01/11/2020   HGB 13.6 01/11/2020   HCT 41.2 01/11/2020   MCV 87.3 01/11/2020   PLT 154 01/11/2020     Recent Labs       Lab Results  Component Value Date   NA 140 01/11/2020   K 4.3 01/11/2020   CL 105 01/11/2020   CO2 22 01/11/2020      RADIOGRAPHIC STUDIES: I have personally reviewed the radiological reports and agreed with the findings in the report.  ASSESSMENT AND PLAN:  Tonsillar cancer ([place]) 01/14/2020: 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  Two right cervical lymph nodes metastases measuring 2.5 cm and 2.1 cm [id number] stage IVa HPV status: Positive  Counseling: I discussed with the patient extensively about the etiology and pathogenesis of head and neck cancer and extent of disease involvement in his tonsil extending to base of tongue and the cervical lymph nodes. Even though it is stage IV A, it is considered to be curable cancer.  Recommendation for treatment: Concurrent chemoradiation with cisplatin. Cisplatin  counseling: I discussed extensively the risks and benefits of cisplatin including the risk of cytopenias, risk of infection, hearing loss, neuropathy, hair loss, mucositis, nausea and vomiting or even death.  Plan: 1.  Radiation consultation 2. Dental evaluation from Dr. Dorothyann Gibbs is arranged for tomorrow 3.  Port placement: IR 4.  NG tube: IR  5.  Hearing evaluation: Requested ENT 6.  Nutritionist evaluation Start chemotherapy concurrently with radiation.  Patient will come weekly for IV hydration to replace potassium and magnesium. I am extremely worried about his hearing because he already has existing hearing loss.  We discussed alternatives to cisplatin but the patient is reluctant to receive cisplatin because of his higher cure rate.  If he cannot tolerate cisplatin we will switch him to carboplatin.   Return to clinic the start of chemotherapy    All questions were answered. The patient knows to call the clinic with any problems, questions or concerns.   Rulon Eisenmenger, MD, MPH 01/27/2020    I, Molly Dorshimer, am acting as scribe for [name], MD.  I have reviewed the above documentation for accuracy and completeness, and I agree with the above.          Electronically signed by [name], MD at 01/27/2020 4:30 PM

## 2020-02-04 NOTE — Anesthesia Procedure Notes (Addendum)
Procedure Name: Intubation Date/Time: 02/04/2020 9:41 AM Performed by: Trinna Post., CRNA Pre-anesthesia Checklist: Patient identified, Emergency Drugs available, Suction available, Patient being monitored and Timeout performed Patient Re-evaluated:Patient Re-evaluated prior to induction Oxygen Delivery Method: Circle system utilized Preoxygenation: Pre-oxygenation with 100% oxygen Induction Type: IV induction Nasal Tubes: Left, Nasal prep performed and Nasal Rae Tube size: 6.5 mm Number of attempts: 1 Airway Equipment and Method: Fiberoptic brochoscope Placement Confirmation: ETT inserted through vocal cords under direct vision,  positive ETCO2 and breath sounds checked- equal and bilateral Tube secured with: Tape Dental Injury: Teeth and Oropharynx as per pre-operative assessment  Difficulty Due To: Difficulty was anticipated Future Recommendations: Recommend- awake intubation Comments: Awake nasal fiberoptic intubation anticipated and performed by MD and CRNA. Nasal passages and airway anesthetized and prepped while keeping patient spontaneously breathing and awake throughout procedure. 6.5 nasal rae passed through left nare with visualization of airway using disposable fiberoptic bronchoscope. Patient tolerated well with no adverse events.

## 2020-02-04 NOTE — Op Note (Signed)
OPERATIVE REPORT  Patient:            Omar Christian Date of Birth:  06-16-1956 MRN:                [id number]   DATE OF PROCEDURE:  02/04/2020  PREOPERATIVE DIAGNOSES: 1.  Squamous cell carcinoma of the right tonsil 2.  Prechemoradiation therapy dental protocol 3.  Acute pulpitis 4.  Chronic apical periodontitis 5.  Dental caries 6.  Retained root segments 7.  Chronic periodontitis 8.  Accretions 9.  Tooth mobility 10.  Dental phobia  POSTOPERATIVE DIAGNOSES: 1.  Squamous cell carcinoma of the right tonsil 2.  Prechemoradiation therapy dental protocol 3.  Acute pulpitis 4.  Chronic apical periodontitis 5.  Dental caries 6.  Retained root segments 7.  Chronic periodontitis 8.  Accretions 9.  Tooth mobility 10.  Dental phobia  OPERATIONS: 1. Multiple extraction of tooth numbers 0,2,40,97,35, and 32  2. 3 quadrants of alveoloplasty  3. Gross debridement of remaining teeth  SURGEON: [name], DDS  ANESTHESIA: General anesthesia via nasoendotracheal tube.  MEDICATIONS: 1. Ancef 2 g IV prior to invasive dental procedures. 2. Local anesthesia with a total utilization of 5 carpules each containing 34 mg of lidocaine with 0.017 mg of epinephrine as well as 2 carpules each containing 9 mg of bupivacaine with 0.009 mg of epinephrine.  SPECIMENS: There are 6 teeth that were discarded.  DRAINS: None  CULTURES: None  COMPLICATIONS: None  ESTIMATED BLOOD LOSS: 100 mLs.  INTRAVENOUS FLUIDS: 800 mLs of Lactated ringers solution.  INDICATIONS: The patient was recently diagnosed with squamous cell carcinoma of the right tonsil.  A medically necessary dental consultation was then requested to evaluate poor dentition.  The patient was examined and treatment planned for multiple extractions with alveoloplasty and gross debridement of remaining dentition in the operating room with general anesthesia.  This treatment plan was formulated to decrease the risks and  complications associated with dental infection from affecting the patient's systemic health and to prevent future complications such as infection and osteoradionecrosis.  OPERATIVE FINDINGS: Patient was examined operating room number 12.  The teeth were identified for extraction. The patient was noted be affected by history of acute pulpitis, chronic apical periodontitis, dental caries, retained root segments, chronic periodontitis, accretions, and loose teeth.     DESCRIPTION OF PROCEDURE: Patient was brought to the main operating room number 12. Patient was then placed in the supine position on the operating table. General anesthesia was then induced per the anesthesia team. The patient was then prepped and draped in the usual manner for dental medicine procedure. A timeout was performed. The patient was identified and procedures were verified. A throat pack was carefully placed at this time. The oral cavity was then thoroughly examined with the findings noted above. The patient was then ready for dental medicine procedure as follows:  Local anesthesia was then administered sequentially with a total utilization of 5 carpules each containing 34 mg of lidocaine with 0.017 mg of epinephrine as well as 2 carpules  each containing 9 mg bupivacaine with 0.009 mg of epinephrine.  The Maxillary left and right quadrants first approached. Anesthesia was then delivered utilizing infiltration with lidocaine with epinephrine. A #15 blade incision was then made from the distal of #3 and extended to the mesial of #6.  A 2nd 15 blade incision was made from the mesial of #15 and extended to the mesial #11.  A  surgical flap was then carefully reflected.  Tooth numbers 4, 5, 12, and 13 were then subluxated with a series of straight elevators.  Tooth numbers 4, 5, 12, 13 were then removed with a 150 forceps without complications.  Alveoloplasty was then performed utilizing a rongeurs and bone file to help achieve primary  closure in both surgical sites.  The tissues were approximated and trimmed appropriately.  The surgical sites were irrigated with copious amounts sterile saline.  Piece of Surgifoam splint placed in the extraction socket of tooth numbers 4, 5, 12, and 13 to aid hemostasis.  The maxillary right surgical site was then closed from the distal #3 and extended to the distal #6 utilizing 3-0 Chromic Gut suture in a continuous erupted suture technique x1.  The maxillary left surgical site was then closed from the mesial #15 and extended to the distal of #11 utilizing 3-0 Chromic Gut suture in a continuous erupted suture technique x1.    At this point time, the mandibular quadrants were approached. The patient was given a mandibular right infra alveolar nerve block and long buccal nerve block utilizing the bupivacaine with epinephrine. Further infiltration was then achieved utilizing the lidocaine with epinephrine. A 15 blade incision was then made from the distal of number 32 and extended to the mesial of #29.  A surgical flap was then carefully reflected. Tooth numbers 31 and 32 were then subluxated with a series of straight elevators. Tooth numbers 31 and 32 were then removed utilizing a 2 3 forceps without complications. Alveoloplasty was then performed utilizing a rongeurs and bone file to help achieve primary closure. The tissues were approximated and trimmed appropriately. The surgical sites were then irrigated with copious amounts of sterile saline.  A piece of Surgifoam was placed in the extraction socket of tooth numbers 31 and 32.   The mandibular right surgical site was then closed from the distal of 32 and extended to the distal of #29 utilizing 3-0 chromic gut suture in a continuous interrupted suture technique x1.   At this point in time, a gross debridement procedure was performed utilizing the sonic scaler.  A series of hand curettes were used to further remove accretions.  The KaVo sonic scaler was then  again used to further refine the removal of accretions.  This completed the gross debridement procedure.                                                   At this point time, the entire mouth was irrigated with copious amounts of sterile saline. The patient was examined for complications, seeing none, the dental medicine procedure was deemed to be complete. The throat pack was removed at this time. An oral airway was then placed at the request of the anesthesia team. A series of 4 x 4 gauze were placed in the mouth to aid hemostasis. The patient was then handed over to the anesthesia team for final disposition. After an appropriate amount of time, the patient was extubated and taken to the postanesthsia care unit in good condition. All counts were correct for the dental medicine procedure.  The patient will be admitted by the hospitalist team for overnight observation and monitoring of his airway and oxygen saturation.   [name], DDS.

## 2020-02-04 NOTE — Transfer of Care (Signed)
Immediate Anesthesia Transfer of Care Note  Patient: Omar Christian  Procedure(s) Performed: Extraction of tooth #'s [phone], and 32 with alveoloplasty and gross debridement of remaining teeth. (N/A )  Patient Location: PACU  Anesthesia Type:General  Level of Consciousness: awake, alert , patient cooperative and responds to stimulation  Airway & Oxygen Therapy: Patient Spontanous Breathing and Patient connected to face mask oxygen  Post-op Assessment: Report given to RN and Post -op Vital signs reviewed and stable  Post vital signs: Reviewed and stable  Last Vitals:  Vitals Value Taken Time  BP 138/74 02/04/20 1122  Temp    Pulse 75 02/04/20 1125  Resp 15 02/04/20 1125  SpO2 99 % 02/04/20 1125  Vitals shown include unvalidated device data.  Last Pain:  Vitals:   02/04/20 0800  PainSc: 0-No pain         Complications: No complications documented.

## 2020-02-04 NOTE — H&P (Addendum)
History and Physical    Omar Christian KCL:[id number] DOB: 04-Dec-1955 DOA: 02/04/2020  PCP: Margo Common, PA Consultants:  Lindi Adie - oncology; Isidore Moos - rad onc; Enrique Sack - oral surgery Patient coming from:  Home - lives alone; NOK: Daughter  Chief Complaint: dental extraction  HPI: Omar Christian is a 64 y.o. 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  Patient with recent cancer diagnosis, and recommended to have extensive dental extractions with alveoplasty and debridement in order to facilitate cancer treatment.  He was going to the [facility] but given concerns for possible airway compromise was instead sent to [facility] for the procedure.  The procedure was completed (extraction of 6 teeth) without difficulty but anesthesia requested overnight observation to ensure that bleeding did not resume and compromise his airway.  The patient was somewhat dismayed to not go home following the procedure.  He denied pain but was somewhat frustrated about the numbness of his jaw.  He requested a diet.    ED Course:  S/p dental extraction.  Had fiberoptic intubation, would like to observe overnight.  Needs med surg with continuous pulse ox, pain control.    Review of Systems: As per HPI; otherwise review of systems reviewed and negative.   Ambulatory Status:  Ambulates without assistance   Past Medical History:  Diagnosis Date  . Abnormal findings on cardiac catheterization 09/30/15   [facility]  . Abnormal myocardial perfusion study 09/24/15   [facility]  . CAD, multiple vessel 09/30/15   per CATH @ [facility]/DR.PARACHOS  . Coronary artery disease   . GERD (gastroesophageal reflux disease)   . Gout   . H/O echocardiogram 09/24/15   [place]  . Hard of hearing   . History of pneumonia   . Hyperlipidemia   . Hypertension   . Pneumonia   . Progressive angina ([place])   . 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 CARDIAC CATHETERIZATION N/A 09/30/2015   Procedure: Left Heart Cath and Coronary Angiography;  Surgeon: [name], MD;  Location: [place] [facility];  Service: Cardiovascular;  Laterality: N/A;  . CORONARY ARTERY BYPASS GRAFT N/A 10/08/2015   Procedure: CORONARY ARTERY BYPASS GRAFTING time 4 using left internal mammary and right greater saphenous vein harvested by endovein;  Surgeon: [name], MD;  Location: [place];  Service: Open Heart Surgery;  Laterality: N/A;  . CORONARY ARTERY BYPASS GRAFT    . TEE WITHOUT CARDIOVERSION N/A 10/08/2015   Procedure: TRANSESOPHAGEAL ECHOCARDIOGRAM (TEE);  Surgeon: [name], MD;  Location: [place];  Service: Open Heart Surgery;  Laterality: N/A;  . VASECTOMY    . WISDOM TOOTH EXTRACTION      Social History   Socioeconomic History  . Marital status: Married    Spouse name: Not on file  . Number of children: 2  . Years of education: Not on file  . Highest education level: Not on file  Occupational History  . Not on file  Tobacco Use  . Smoking status: Never Smoker  . Smokeless tobacco: Never Used  Vaping Use  . Vaping Use: Never used  Substance and Sexual Activity  . Alcohol use: No  . Drug use: No  . Sexual activity: Not Currently    Birth control/protection: Surgical    Comment: Vasectomy  Other Topics Concern  . Not  on file  Social History Narrative  . Not on file   Social Determinants of Health   Financial Resource Strain: Low Risk   . Difficulty of Paying Living Expenses: Not very hard  Food Insecurity:   . Worried About Charity fundraiser in the Last Year:   . Arboriculturist in the Last Year:   Transportation Needs: No Transportation Needs  . Lack of Transportation (Medical): No  . Lack of Transportation (Non-Medical): No  Physical Activity:   . Days of Exercise per Week:   . Minutes of Exercise per Session:   Stress:   .  Feeling of Stress :   Social Connections:   . Frequency of Communication with Friends and Family:   . Frequency of Social Gatherings with Friends and Family:   . Attends Religious Services:   . Active Member of Clubs or Organizations:   . Attends Archivist Meetings:   Marland Kitchen Marital Status:   Intimate Partner Violence:   . Fear of Current or Ex-Partner:   . Emotionally Abused:   Marland Kitchen Physically Abused:   . Sexually Abused:     No Known Allergies  Family History  Problem Relation Age of Onset  . Heart disease Mother   . Hypertension Mother   . Alcohol abuse Father   . Throat cancer Father   . Muscular dystrophy Brother   . Muscular dystrophy Brother     Prior to Admission medications   Medication Sig Start Date End Date Taking? Authorizing Provider  acetaminophen (TYLENOL) 650 MG CR tablet Take 650 mg by mouth daily.    Yes [provider]  allopurinol (ZYLOPRIM) 100 MG tablet Take 1 tablet (100 mg total) by mouth daily. 07/22/19  Yes Birdie Sons, MD  atorvastatin (LIPITOR) 20 MG tablet Take 1 tablet (20 mg total) by mouth daily. 09/25/19  Yes Chrismon, Vickki Muff, PA  dexamethasone (DECADRON) 4 MG tablet Take 2 tablets (8 mg total) by mouth daily. Take daily for 3 days after chemo. Take with food. 02/02/20   [name], MD  indomethacin (INDOCIN) 50 MG capsule Take 1 capsule (50 mg total) by mouth 3 (three) times daily with meals. As needed for foot pain Patient not taking: Reported on 01/26/2020 07/22/19   Birdie Sons, MD  lidocaine-prilocaine (EMLA) cream Apply to affected area once 02/02/20   [name], MD  LORazepam (ATIVAN) 0.5 MG tablet Take 1 tablet (0.5 mg total) by mouth at bedtime as needed (Nausea or vomiting). 02/02/20   [name], MD  ondansetron (ZOFRAN) 8 MG tablet Take 1 tablet (8 mg total) by mouth 2 (two) times daily as needed. Start on the third day after chemotherapy. 02/02/20   [name], MD  oxyCODONE-acetaminophen (PERCOCET/ROXICET)  5-325 MG tablet Take 1 tablet by mouth every 4 (four) hours as needed.  01/14/20   [provider]  prochlorperazine (COMPAZINE) 10 MG tablet Take 1 tablet (10 mg total) by mouth every 6 (six) hours as needed (Nausea or vomiting). 02/02/20   [name], MD  sodium fluoride (PREVIDENT 5000 PLUS) 1.1 % CREA dental cream Apply to tooth brush. Brush teeth for 2 minutes. Spit out excess-DO NOT swallow. DO NOT rinse afterwards. Repeat nightly. 01/28/20   [name], DDS    Physical Exam: Vitals:   02/04/20 1253 02/04/20 1255 02/04/20 1408 02/04/20 1538  BP:  129/77 (!) 150/75 (!) 144/95  Pulse:  77 87 92  Resp:  17 18 18  Temp: 97.8 F (36.6 C)     SpO2:  94% 97% 95%  Weight:      Height:         . General:  Appears calm and comfortable and is NAD . Eyes:   EOMI, normal lids, iris . ENT:  grossly normal hearing, lips; ice ring present around jaw . Neck:  no LAD, masses or thyromegaly . Cardiovascular:  RRR, no m/r/g. No LE edema.  Marland Kitchen Respiratory:   CTA bilaterally with no wheezes/rales/rhonchi.  Normal respiratory effort. . Abdomen:  soft, NT, ND, NABS . Skin:  no rash or induration seen on limited exam . Musculoskeletal:  grossly normal tone BUE/BLE, good ROM, no bony abnormality . Psychiatric:  grossly normal mood and affect, speech fluent and appropriate (limited by recent dental procedure), AOx3 . Neurologic:  CN 2-12 grossly intact, moves all extremities in coordinated fashion    Radiological Exams on Admission: No results found.  EKG: not done   Labs on Admission: I have personally reviewed the available labs and imaging studies at the time of the admission.  Pertinent labs:   Glucose 218 WBC 5.9 Hgb 11.9 COVID negative   Assessment/Plan Principal Problem:   Poor dentition Active Problems:   Benign hypertension   CAD in native artery   Cancer of tonsil (HCC)   Hyperglycemia    Poor dentition -Patient with multiple dental extractions (6 teeth)  today in the [facility] OR -Also with alveoplasty and debridement of remaining teeth -Will observe overnight to ensure no airway compromise after intubation with known head and neck CA -Pain control -Continuous pulse ox -Anticipate d/c to home tomorrow  SCC of R tonsil -CT on 7/11 diagnosed large R tonsillar carcinoma with B tongue invasion and LAD spread -Biopsy + for SCC -PET with mass extending to the B tongue base with R cervical nodal mets -Patient is planned to start chemoradiation treatment  HTN -He does not appear to be taking medications for this issue at this time  CAD -s/p CABG  HLD -Continue Lipitor  Hyperglycemia -May be stress response -Will follow with fasting AM labs and check A1c    Note: This patient has been tested and is negative for the novel coronavirus COVID-19.  DVT prophylaxis:  SCDs Code Status:  Full - confirmed with patient Family Communication: None present; Dr. Enrique Sack spoke with the patient's daughter at the time of admission. Disposition Plan:  The patient is from: home  Anticipated d/c is to: home without [facility] services   Anticipated d/c date will depend on clinical response to treatment, but possibly as early as tomorrow if he has excellent response to treatment  Patient is currently: acutely ill Consults called: Dental Admission status:  It is my clinical opinion that referral for OBSERVATION is reasonable and necessary in this patient based on the above information provided. The aforementioned taken together are felt to place the patient at high risk for further clinical deterioration. However it is anticipated that the patient may be medically stable for discharge from the hospital within 24 to 48 hours.    Karmen Bongo MD [facility]   How to contact the [facility] Attending or Consulting provider [place] or covering provider during after hours [place], for this patient?  1. Check the care team in [facility] and look for a) attending/consulting  TR[facility] provider listed and b) the [facility] team listed 2. Log into [email or web address] and use [place]'s universal password to access. If you do not have the  password, please contact the hospital operator. 3. Locate the [facility] provider you are looking for under [facility] and page to a number that you can be directly reached. 4. If you still have difficulty reaching the provider, please page the [facility] (Director on Call) for the Hospitalists listed on amion for assistance.   02/04/2020, 6:26 PM

## 2020-02-04 NOTE — Anesthesia Postprocedure Evaluation (Signed)
Anesthesia Post Note  Patient: Omar Christian  Procedure(s) Performed: Extraction of tooth #'s [phone], and 32 with alveoloplasty and gross debridement of remaining teeth. (N/A )     Patient location during evaluation: PACU Anesthesia Type: General Level of consciousness: awake and alert Pain management: pain level controlled Vital Signs Assessment: post-procedure vital signs reviewed and stable Respiratory status: spontaneous breathing, nonlabored ventilation, respiratory function stable and patient connected to nasal cannula oxygen Cardiovascular status: blood pressure returned to baseline and stable Postop Assessment: no apparent nausea or vomiting Anesthetic complications: no   No complications documented.  Last Vitals:  Vitals:   02/04/20 1255 02/04/20 1408  BP: 129/77 (!) 150/75  Pulse: 77 87  Resp: 17 18  Temp:    SpO2: 94% 97%    Last Pain:  Vitals:   02/04/20 1253  PainSc: 0-No pain                 Brendaliz Kuk L Aleighna Wojtas

## 2020-02-04 NOTE — Discharge Instructions (Signed)

## 2020-02-04 NOTE — Progress Notes (Signed)
PRE-OPERATIVE NOTE:  02/04/2020 Omar Christian [id number]  VITALS: BP (!) 156/79   Pulse 76   Temp 98.1 F (36.7 C)   Resp 18   Ht 6\' 1"  (1.854 m)   Wt 102.5 kg   SpO2 98%   BMI 29.82 kg/m   Lab Results  Component Value Date   WBC 5.9 02/03/2020   HGB 11.9 (L) 02/03/2020   HCT 37.1 (L) 02/03/2020   MCV 89.0 02/03/2020   PLT 180 02/03/2020   BMET    Component Value Date/Time   NA 138 02/03/2020 1320   NA 141 09/19/2019 0950   K 4.7 02/03/2020 1320   CL 102 02/03/2020 1320   CO2 27 02/03/2020 1320   GLUCOSE 218 (H) 02/03/2020 1320   BUN 14 02/03/2020 1320   BUN 9 09/19/2019 0950   CREATININE 0.95 02/03/2020 1320   CALCIUM 9.2 02/03/2020 1320   GFRNONAA >60 02/03/2020 1320   GFRAA >60 02/03/2020 1320    Lab Results  Component Value Date   INR 1.40 10/08/2015   INR 1.05 10/06/2015   No results found for: PTT   Omar Christian presents for multiple dental extractions with the alveoloplasty in the operating room with general anesthesia.  Patient denies any acute medical or dental changes.     SUBJECTIVE: The patient denies any acute medical or dental changes and agrees to proceed with treatment as planned. Patient is aware of the concern for respiratory compromise with possible need for tracheostomy procedure if he has a difficult intubation.  Patient also is aware of the need for admission for observation due to his airway compromise.  EXAM: No sign of acute dental changes.  ASSESSMENT: Patient is affected by retained root segments, chronic apical periodontitis, history of acute pulpitis, dental caries, chronic periodontitis, mobility, and accretions.  PLAN: Patient agrees to proceed with treatment as planned in the operating room as previously discussed and accepts the risks, benefits, and complications of the proposed treatment. Patient is aware of the risk for bleeding, bruising, swelling, infection, pain, nerve damage, soft tissue damage, sinus involvement,  root tip fracture, mandible fracture, and the risks of complications associated with the anesthesia. Patient also is aware of the potential for other complications up to and including death due to his overall medical and respiratory compromise.     [name], DDS

## 2020-02-04 NOTE — Interval H&P Note (Signed)
Anesthesia H&P Update: History and Physical Exam reviewed; patient is OK for planned anesthetic and procedure. ? ?

## 2020-02-05 ENCOUNTER — Encounter (HOSPITAL_COMMUNITY): Payer: Self-pay | Admitting: Dentistry

## 2020-02-05 DIAGNOSIS — K089 Disorder of teeth and supporting structures, unspecified: Secondary | ICD-10-CM | POA: Diagnosis not present

## 2020-02-05 DIAGNOSIS — C099 Malignant neoplasm of tonsil, unspecified: Secondary | ICD-10-CM | POA: Diagnosis not present

## 2020-02-05 LAB — BASIC METABOLIC PANEL
Anion gap: 13 (ref 5–15)
BUN: 10 mg/dL (ref 8–23)
CO2: 25 mmol/L (ref 22–32)
Calcium: 9.2 mg/dL (ref 8.9–10.3)
Chloride: 100 mmol/L (ref 98–111)
Creatinine, Ser: 0.98 mg/dL (ref 0.61–1.24)
GFR calc Af Amer: >60 mL/min (ref >60)
GFR calc non Af Amer: >60 mL/min (ref >60)
Glucose, Bld: 219 mg/dL — ABNORMAL HIGH (ref 70–99)
Potassium: 3.9 mmol/L (ref 3.5–5.1)
Sodium: 138 mmol/L (ref 135–145)

## 2020-02-05 LAB — CBC
HCT: 36.4 % — ABNORMAL LOW (ref 39.0–52.0)
Hemoglobin: 11.7 g/dL — ABNORMAL LOW (ref 13.0–17.0)
MCH: 28.1 pg (ref 26.0–34.0)
MCHC: 32.1 g/dL (ref 30.0–36.0)
MCV: 87.3 fL (ref 80.0–100.0)
Platelets: 181 10*3/uL (ref 150–400)
RBC: 4.17 MIL/uL — ABNORMAL LOW (ref 4.22–5.81)
RDW: 13.7 % (ref 11.5–15.5)
WBC: 7.2 10*3/uL (ref 4.0–10.5)
nRBC: 0 % (ref 0.0–0.2)

## 2020-02-05 LAB — GLUCOSE, CAPILLARY: Glucose-Capillary: 140 mg/dL — ABNORMAL HIGH (ref 70–99)

## 2020-02-05 NOTE — Progress Notes (Signed)
Patient is discharged from room 3C02 at this time. Alert and in stable condition. IV site d/c'd and instructions read to patient and daughter with understanding verbalized and all questions answered. Ambulate out of unit with all belongings at side.

## 2020-02-05 NOTE — Discharge Summary (Signed)
Physician Discharge Summary  Omar Christian BDZ:[id number] DOB: 1956-02-28 DOA: 02/04/2020  PCP: Margo Common, PA  Admit date: 02/04/2020 Discharge date: 02/05/2020  Admitted From: Home Disposition: Home  Recommendations for Outpatient Follow-up:  1. Follow up with PCP in 1-2 weeks 2. Follow-up with Dr.Kulinski as scheduled on 02/06/2020 at [place]: No Equipment/Devices: None  Discharge Condition: Stable CODE STATUS: Full code Diet recommendation: Heart healthy diet  History of present illness:  Omar Christian is a 64 y.o. 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  Patient with recent cancer diagnosis, and recommended to have extensive dental extractions with alveoplasty and debridement in order to facilitate cancer treatment.  He was going to the [facility] but given concerns for possible airway compromise was instead sent to [facility] for the procedure.  The procedure was completed (extraction of 6 teeth) without difficulty but anesthesia requested overnight observation to ensure that bleeding did not resume and compromise his airway.  The patient was somewhat dismayed to not go home following the procedure.  He denied pain but was somewhat frustrated about the numbness of his jaw.  He requested a diet.  ED Course:  S/p dental extraction.  Had fiberoptic intubation, would like to observe overnight.  Needs med surg with continuous pulse ox, pain control.   Hospital course:  Poor dentition Patient presented for observation mission following multiple dental extractions in the [facility] operating room by Dr. Enrique Sack.  Given he was fiberoptically intubated with significant squamous cell carcinoma of his tonsils, he recommended observation overnight to ensure no airway compromise following procedure.  Patient's pain was not controlled adequately with no concerning signs for airway compromise and patient  ready for discharge home.  Patient has follow-up with Dr. Enrique Sack scheduled on 02/06/2020.  Patient denies any further pain medication needs on discharge.  SCC of R tonsil CT on 7/11 will for large R tonsillar carcinoma with B tongue invasion and LAD spread. Biopsy positive for SCC. PET with mass extending to the B tongue base with R cervical nodal mets.  Patient is planned for simulation on 02/06/2020 by Dr. Isidore Moos with planned start of chemo radiation shortly thereafter.  Continue outpatient follow-up with medical and radiation oncology.  History of essential hypertension Currently not on any antihypertensives outpatient.  Blood pressure 143/83 at time of discharge.  Follow-up with PCP.  CAD s/p CABG  HLD Continue Lipitor  Discharge Diagnoses:  Principal Problem:   Poor dentition Active Problems:   Benign hypertension   CAD in native artery   Cancer of tonsil [facility])    Discharge Instructions  Discharge Instructions    Call MD for:  difficulty breathing, headache or visual disturbances   Complete by: As directed    Call MD for:  extreme fatigue   Complete by: As directed    Call MD for:  persistant dizziness or light-headedness   Complete by: As directed    Call MD for:  persistant nausea and vomiting   Complete by: As directed    Call MD for:  severe uncontrolled pain   Complete by: As directed    Call MD for:  temperature >100.4   Complete by: As directed    Diet - low sodium heart healthy   Complete by: As directed    Increase activity slowly   Complete by: As directed    No wound care   Complete by: As directed  Allergies as of 02/05/2020   No Known Allergies     Medication List    TAKE these medications   acetaminophen 650 MG CR tablet Commonly known as: TYLENOL Take 650 mg by mouth daily.   allopurinol 100 MG tablet Commonly known as: ZYLOPRIM Take 1 tablet (100 mg total) by mouth daily.   atorvastatin 20 MG tablet Commonly known as:  LIPITOR Take 1 tablet (20 mg total) by mouth daily.   dexamethasone 4 MG tablet Commonly known as: DECADRON Take 2 tablets (8 mg total) by mouth daily. Take daily for 3 days after chemo. Take with food.   lidocaine-prilocaine cream Commonly known as: EMLA Apply to affected area once   LORazepam 0.5 MG tablet Commonly known as: Ativan Take 1 tablet (0.5 mg total) by mouth at bedtime as needed (Nausea or vomiting).   ondansetron 8 MG tablet Commonly known as: Zofran Take 1 tablet (8 mg total) by mouth 2 (two) times daily as needed. Start on the third day after chemotherapy.   oxyCODONE-acetaminophen 5-325 MG tablet Commonly known as: PERCOCET/ROXICET Take 1 tablet by mouth every 4 (four) hours as needed.   prochlorperazine 10 MG tablet Commonly known as: COMPAZINE Take 1 tablet (10 mg total) by mouth every 6 (six) hours as needed (Nausea or vomiting).   sodium fluoride 1.1 % Crea dental cream Commonly known as: PreviDent 5000 Plus Apply to tooth brush. Brush teeth for 2 minutes. Spit out excess-DO NOT swallow. DO NOT rinse afterwards. Repeat nightly.       Follow-up Information    Alister, Staver, DDS On 02/06/2020.   Specialty: Dentistry Why: F/U for insertion of scatter guards in dental clinic at 11 am. Contact information: [place] [address] [phone]              No Known Allergies  Consultations:  Dental - Dr. Enrique Sack   Procedures/Studies: NM PET Image Initial (PI) Skull Base To Thigh  Result Date: 01/26/2020 CLINICAL DATA:  Initial treatment strategy for right head/neck cancer. EXAM: NUCLEAR MEDICINE PET SKULL BASE TO THIGH TECHNIQUE: 11.5 mCi F-18 FDG was injected intravenously. Full-ring PET imaging was performed from the skull base to thigh after the radiotracer. CT data was obtained and used for attenuation correction and anatomic localization. Fasting blood glucose: 123 mg/dl COMPARISON:  Maxillofacial CT dated  01/11/2020. CT abdomen/pelvis dated 01/09/2017. FINDINGS: Mediastinal blood pool activity: SUV max 3.0 Liver activity: SUV max NA NECK: Right tonsillar mass extending to the right base of tongue and crossing midline to the left, measuring approximately 4.9 x 3.7 cm in maximal axial measurement (series 4/image 29), max SUV 18.3, compatible with primary head/neck cancer. 2.5 cm short axis right level 2 node (series 4/image 32), max SUV 12.5. Additional 2.1 cm necrotic right level 2 node (series 4/image 37), without appreciable hypermetabolism given necrosis, but also suspicious for metastasis. Incidental CT findings: none CHEST: No hypermetabolic thoracic lymphadenopathy. No suspicious pulmonary nodules. Incidental CT findings: Mild atherosclerotic calcifications the descending thoracic aorta. 3 vessel coronary atherosclerosis. Postsurgical changes related to prior CABG. ABDOMEN/PELVIS: No abnormal hypermetabolism in the liver, spleen, pancreas, or adrenal glands. No hypermetabolic abdominopelvic lymphadenopathy. Incidental CT findings: 3.0 x 2.3 cm calcified jejunal mesenteric lesion in the left mid abdomen (series 4/image 139), unchanged from 2018 and non FDG avid, of uncertain etiology but indolent and therefore likely of no clinical significance given the additional findings described above. Mild hepatic steatosis with focal fatty sparing along the gallbladder fossa. Cholelithiasis,  without associated inflammatory changes. Nonobstructing right renal calculi measuring 2-3 mm (series 4/image 139). Atherosclerotic calcifications of the abdominal aorta and branch vessels. SKELETON: No focal hypermetabolic activity to suggest skeletal metastasis. Incidental CT findings: Degenerative changes of the visualized thoracolumbar spine. IMPRESSION: Right tonsillar mass extending to the bilateral tongue base, measuring at least 4.9 cm in maximal axial dimension, better evaluated on recent CT. This corresponds to the patient's  known primary head/neck cancer. Associated right cervical nodal metastases. No evidence of distant metastases. Electronically Signed   By: Julian Hy M.D.   On: 01/26/2020 15:29   CT Maxillofacial W Contrast  Result Date: 01/11/2020 CLINICAL DATA:  Right upper dental pain right-sided neck swelling. Right-sided ear pain symptoms for 6 months. EXAM: CT MAXILLOFACIAL WITH CONTRAST TECHNIQUE: Multidetector CT imaging of the maxillofacial structures was performed with intravenous contrast. Multiplanar CT image reconstructions were also generated. CONTRAST:  58mL OMNIPAQUE IOHEXOL 300 MG/ML  SOLN COMPARISON:  None. FINDINGS: Osseous: Negative for destructive process. Orbits: No evidence of mass or inflammation Sinuses: Clear Soft tissues: Mass in the right tonsillar fossa and glossotonsillar sulcus with bilateral root of tongue invasion, 4.8 cm maximum on axial slices. Multiple ipsilateral necrotic lymph nodes in the right jugular chain with the largest discrete node measuring 3.2 cm craniocaudal. Based on the nodal margins there may be extracapsular disease. Limited intracranial: No evidence of intracranial spread of disease. 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 Electronically Signed   By: Monte Fantasia M.D.   On: 01/11/2020 10:00      Subjective: Patient seen and examined bedside, resting comfortably.  Ready for discharge home.  No concerns with eating.  Tolerating diet.  Pain is well controlled.  Family updated at bedside without any concerns or questions at this time.  Patient has follow-up scheduled with his dentist tomorrow with planned simulation by radiation oncology.  Denies headache, no fever/chills/night sweats, no nausea/vomiting/diarrhea, no chest pain, palpitations, no shortness of breath, no abdominal pain, no weakness, no fatigue.  No acute events overnight per nursing staff.  Discharge Exam: Vitals:    02/05/20 0406 02/05/20 0758  BP: (!) 143/83 (!) 167/74  Pulse: 78 96  Resp: 20 18  Temp: 98.2 F (36.8 C) 97.7 F (36.5 C)  SpO2: 98% 100%   Vitals:   02/04/20 1941 02/04/20 2330 02/05/20 0406 02/05/20 0758  BP: (!) 147/86 (!) 145/76 (!) 143/83 (!) 167/74  Pulse: (!) 102 92 78 96  Resp: 20 20 20 18   Temp: 99.5 F (37.5 C) 98.2 F (36.8 C) 98.2 F (36.8 C) 97.7 F (36.5 C)  TempSrc: Oral Oral Oral Oral  SpO2: 98% 96% 98% 100%  Weight:      Height:        General: Pt is alert, awake, not in acute distress Cardiovascular: RRR, S1/S2 +, no rubs, no gallops Respiratory: CTA bilaterally, no wheezing, no rhonchi Abdominal: Soft, NT, ND, bowel sounds + Extremities: no edema, no cyanosis    The results of significant diagnostics from this hospitalization (including imaging, microbiology, ancillary and laboratory) are listed below for reference.     Microbiology: Recent Results (from the past 240 hour(s))  SARS CORONAVIRUS 2 (TAT 6-24 HRS) Nasopharyngeal Nasopharyngeal Swab     Status: None   Collection Time: 02/03/20  9:57 AM   Specimen: Nasopharyngeal Swab  Result Value Ref Range Status   SARS Coronavirus 2 NEGATIVE NEGATIVE Final    Comment: (NOTE) SARS-CoV-2  target nucleic acids are NOT DETECTED.  The SARS-CoV-2 RNA is generally detectable in upper and lower respiratory specimens during the acute phase of infection. Negative results do not preclude SARS-CoV-2 infection, do not rule out co-infections with other pathogens, and should not be used as the sole basis for treatment or other patient management decisions. Negative results must be combined with clinical observations, patient history, and epidemiological information. The expected result is Negative.  Fact Sheet for Patients: SugarRoll.be  Fact Sheet for Healthcare Providers: https://www.woods-mathews.com/  This test is not yet approved or cleared by the [place] FDA and  has been authorized for detection and/or diagnosis of SARS-CoV-2 by FDA under an Emergency Use Authorization (EUA). This EUA will remain  in effect (meaning this test can be used) for the duration of the COVID-19 declaration under Se ction 564(b)(1) of the Act, 21 U.S.C. section 360bbb-3(b)(1), unless the authorization is terminated or revoked sooner.  Performed at [facility], [place] [address] [place], [place] [address]      Labs: BNP (last 3 results) No results for input(s): BNP in the last 8760 hours. Basic Metabolic Panel: Recent Labs  Lab 02/03/20 1320  NA 138  K 4.7  CL 102  CO2 27  GLUCOSE 218*  BUN 14  CREATININE 0.95  CALCIUM 9.2   Liver Function Tests: No results for input(s): AST, ALT, ALKPHOS, BILITOT, PROT, ALBUMIN in the last 168 hours. No results for input(s): LIPASE, AMYLASE in the last 168 hours. No results for input(s): AMMONIA in the last 168 hours. CBC: Recent Labs  Lab 02/03/20 1320  WBC 5.9  HGB 11.9*  HCT 37.1*  MCV 89.0  PLT 180   Cardiac Enzymes: No results for input(s): CKTOTAL, CKMB, CKMBINDEX, TROPONINI in the last 168 hours. BNP: Invalid input(s): POCBNP CBG: Recent Labs  Lab 02/04/20 1730 02/04/20 2117 02/05/20 0616  GLUCAP 154* 204* 140*   D-Dimer No results for input(s): DDIMER in the last 72 hours. Hgb A1c Recent Labs    02/04/20 1505  HGBA1C 5.8*   Lipid Profile No results for input(s): CHOL, HDL, LDLCALC, TRIG, CHOLHDL, LDLDIRECT in the last 72 hours. Thyroid function studies No results for input(s): TSH, T4TOTAL, T3FREE, THYROIDAB in the last 72 hours.  Invalid input(s): FREET3 Anemia work up No results for input(s): VITAMINB12, FOLATE, FERRITIN, TIBC, IRON, RETICCTPCT in the last 72 hours. Urinalysis    Component Value Date/Time   COLORURINE YELLOW (A) 01/09/2017 1404   APPEARANCEUR CLEAR (A) 01/09/2017 1404   LABSPEC 1.017 01/09/2017 1404   PHURINE 7.0 01/09/2017 1404   GLUCOSEU  NEGATIVE 01/09/2017 1404   HGBUR MODERATE (A) 01/09/2017 1404   BILIRUBINUR NEGATIVE 01/09/2017 1404   KETONESUR NEGATIVE 01/09/2017 1404   PROTEINUR 30 (A) 01/09/2017 1404   NITRITE NEGATIVE 01/09/2017 1404   LEUKOCYTESUR NEGATIVE 01/09/2017 1404   Sepsis Labs Invalid input(s): PROCALCITONIN,  WBC,  LACTICIDVEN Microbiology Recent Results (from the past 240 hour(s))  SARS CORONAVIRUS 2 (TAT 6-24 HRS) Nasopharyngeal Nasopharyngeal Swab     Status: None   Collection Time: 02/03/20  9:57 AM   Specimen: Nasopharyngeal Swab  Result Value Ref Range Status   SARS Coronavirus 2 NEGATIVE NEGATIVE Final    Comment: (NOTE) SARS-CoV-2 target nucleic acids are NOT DETECTED.  The SARS-CoV-2 RNA is generally detectable in upper and lower respiratory specimens during the acute phase of infection. Negative results do not preclude SARS-CoV-2 infection, do not rule out co-infections with other pathogens, and should not be used  as the sole basis for treatment or other patient management decisions. Negative results must be combined with clinical observations, patient history, and epidemiological information. The expected result is Negative.  Fact Sheet for Patients: SugarRoll.be  Fact Sheet for Healthcare Providers: https://www.woods-mathews.com/  This test is not yet approved or cleared by the [place] FDA and  has been authorized for detection and/or diagnosis of SARS-CoV-2 by FDA under an Emergency Use Authorization (EUA). This EUA will remain  in effect (meaning this test can be used) for the duration of the COVID-19 declaration under Se ction 564(b)(1) of the Act, 21 U.S.C. section 360bbb-3(b)(1), unless the authorization is terminated or revoked sooner.  Performed at [facility], [place] [address] [place], [place] [address]      Time coordinating discharge: Over 30 minutes  SIGNED:   Reon Hunley J [place] DO  [facility] 02/05/2020, 8:23 AM

## 2020-02-06 ENCOUNTER — Other Ambulatory Visit: Payer: Self-pay

## 2020-02-06 ENCOUNTER — Ambulatory Visit
Admission: RE | Admit: 2020-02-06 | Discharge: 2020-02-06 | Disposition: A | Discharge status: Home / Self Care | Payer: 59 | Source: Ambulatory Visit | Attending: Radiation Oncology | Admitting: Radiation Oncology

## 2020-02-06 ENCOUNTER — Encounter (HOSPITAL_COMMUNITY): Payer: Self-pay | Admitting: Dentistry

## 2020-02-06 ENCOUNTER — Other Ambulatory Visit: Payer: Self-pay | Admitting: Radiology

## 2020-02-06 ENCOUNTER — Ambulatory Visit (HOSPITAL_COMMUNITY): Payer: 59 | Admitting: Dentistry

## 2020-02-06 VITALS — BP 136/87 | HR 101 | Temp 98.7°F | Resp 20 | Ht 72.0 in | Wt 225.2 lb

## 2020-02-06 DIAGNOSIS — I10 Essential (primary) hypertension: Secondary | ICD-10-CM | POA: Diagnosis not present

## 2020-02-06 DIAGNOSIS — N289 Disorder of kidney and ureter, unspecified: Secondary | ICD-10-CM | POA: Insufficient documentation

## 2020-02-06 DIAGNOSIS — E860 Dehydration: Secondary | ICD-10-CM | POA: Insufficient documentation

## 2020-02-06 DIAGNOSIS — C770 Secondary and unspecified malignant neoplasm of lymph nodes of head, face and neck: Secondary | ICD-10-CM | POA: Insufficient documentation

## 2020-02-06 DIAGNOSIS — Z510 Encounter for antineoplastic radiation therapy: Secondary | ICD-10-CM | POA: Insufficient documentation

## 2020-02-06 DIAGNOSIS — C098 Malignant neoplasm of overlapping sites of tonsil: Secondary | ICD-10-CM | POA: Insufficient documentation

## 2020-02-06 DIAGNOSIS — D6959 Other secondary thrombocytopenia: Secondary | ICD-10-CM | POA: Insufficient documentation

## 2020-02-06 DIAGNOSIS — Z463 Encounter for fitting and adjustment of dental prosthetic device: Secondary | ICD-10-CM

## 2020-02-06 DIAGNOSIS — Z5111 Encounter for antineoplastic chemotherapy: Secondary | ICD-10-CM | POA: Diagnosis not present

## 2020-02-06 DIAGNOSIS — T451X5A Adverse effect of antineoplastic and immunosuppressive drugs, initial encounter: Secondary | ICD-10-CM | POA: Diagnosis not present

## 2020-02-06 DIAGNOSIS — C099 Malignant neoplasm of tonsil, unspecified: Secondary | ICD-10-CM | POA: Insufficient documentation

## 2020-02-06 MED ORDER — SODIUM CHLORIDE 0.9% FLUSH
10.0000 mL | Freq: Once | INTRAVENOUS | Status: AC
  Administered 2020-02-06: 10 mL via INTRAVENOUS

## 2020-02-06 NOTE — Progress Notes (Addendum)
Oncology Nurse Navigator Documentation  Met with Omar Christian and two daughters to provide PEG/port education prior to                 placement.  Provided port educational handout, showed example, provided guidance for post-surgical dsg removal, site care.  . Using  PEG teaching device   and Teach Back, provided education for PEG use and care, including: hand hygiene, gravity bolus administration of daily water flushes and nutritional supplement, fluids and medications; care of tube insertion site including daily dressing change and cleaning; S&S of infection.   . Omar Christian correctly verbalized procedures for and provided correct return demonstration of gravity administration of water, dressing change and site care.  . I provided written instructions for PEG flushing/dressing change in support of verbal instruction.   . I provided/described contents of Start of Care Bolus Feeding Kit (3 60 cc syringes, 2 boxes 4x4 drainage sponges, 1 package mesh briefs, 1 roll paper tape, 1 case Osmolite 1.5).  He voiced understanding he is to start using Osmolite per guidance of Nutrition. Marland Kitchen He understands I will be available for ongoing PEG support. Provided barium sulfate prep which I obtained from [facility] IR, reviewed instructions which included guidance for  02/09/20 COVID screening in [place]  Patient also completed simulation without difficulty today.   They all know to call me if they have any further needs or concerns.   Harlow Asa RN, BSN, OCN Head & Neck Oncology Nurse [place] at [facility] Phone # [phone]  Fax # [phone]

## 2020-02-06 NOTE — Progress Notes (Signed)
Has armband been applied?  Yes.    Does patient have an allergy to IV contrast dye?: No.   Has patient ever received premedication for IV contrast dye?: No.   Does patient take metformin?: No.  Date of lab work: February 05, 2020 BUN: 10 CR: 0.98  IV site: antecubital right, condition patent and no redness  Has IV site been added to flowsheet?  Yes.    BP 136/87 (BP Location: Right Arm, Patient Position: Sitting, Cuff Size: Large)   Pulse (!) 101   Temp 98.7 F (37.1 C)   Resp 20   Ht 6' (1.829 m)   Wt 225 lb 3.2 oz (102.2 kg)   SpO2 99%   BMI 30.54 kg/m

## 2020-02-06 NOTE — Progress Notes (Signed)
02/06/2020  Patient Name:   Omar Christian Date of Birth:   06-Jan-1956 Medical Record Number: [id number]  BP (!) 142/88   Pulse 90   Temp 98.4 F (36.9 C)   Omar Christian now presents for insertion of upper and lower scatter protection devices. Patient had multiple dental extractions with alveoloplasty and gross debridement remaining dentition the operating room on Wednesday, 02/04/2020. SUBJECTIVE: Patient with minimal complaints of dental extraction sites.  OBJECTIVE: There is no sign of infection, heme, or ooze.  Sutures are intact.  ASSESSMENT: Postop course is consistent with dental extractions with alveoloplasty and gross debridement remaining dentition. Patient acceptable to proceed with insertion of scatter guards at this time.  PROCEDURE: Appliances were tried in and adjusted as needed. [place] Trismus device was previously fabricated 32 mm using 15 sticks. Postop instructions were provided and a written and verbal format concerning the use and care of appliances. All questions were answered. Patient to call for appointment for periodic oral examination in approximately 3-4 weeks during radiation therapy if so desired. Patient to call if questions or problems arise before then.   [name], DDS

## 2020-02-09 ENCOUNTER — Other Ambulatory Visit: Payer: Self-pay | Admitting: Radiology

## 2020-02-09 ENCOUNTER — Other Ambulatory Visit (HOSPITAL_COMMUNITY)
Admission: RE | Admit: 2020-02-09 | Discharge: 2020-02-09 | Disposition: A | Discharge status: Home / Self Care | Payer: 59 | Source: Ambulatory Visit | Attending: Hematology and Oncology | Admitting: Hematology and Oncology

## 2020-02-09 DIAGNOSIS — Z01812 Encounter for preprocedural laboratory examination: Secondary | ICD-10-CM | POA: Insufficient documentation

## 2020-02-09 DIAGNOSIS — Z20822 Contact with and (suspected) exposure to covid-19: Secondary | ICD-10-CM | POA: Insufficient documentation

## 2020-02-09 LAB — SARS CORONAVIRUS 2 (TAT 6-24 HRS): SARS Coronavirus 2: NEGATIVE

## 2020-02-10 ENCOUNTER — Ambulatory Visit (HOSPITAL_COMMUNITY)
Admission: RE | Admit: 2020-02-10 | Discharge: 2020-02-10 | Disposition: A | Discharge status: Home / Self Care | Payer: 59 | Source: Ambulatory Visit | Attending: Hematology and Oncology | Admitting: Hematology and Oncology

## 2020-02-10 ENCOUNTER — Other Ambulatory Visit: Payer: Self-pay

## 2020-02-10 ENCOUNTER — Encounter (HOSPITAL_COMMUNITY): Payer: Self-pay

## 2020-02-10 DIAGNOSIS — K219 Gastro-esophageal reflux disease without esophagitis: Secondary | ICD-10-CM | POA: Insufficient documentation

## 2020-02-10 DIAGNOSIS — Z79899 Other long term (current) drug therapy: Secondary | ICD-10-CM | POA: Insufficient documentation

## 2020-02-10 DIAGNOSIS — Z951 Presence of aortocoronary bypass graft: Secondary | ICD-10-CM | POA: Diagnosis not present

## 2020-02-10 DIAGNOSIS — I2510 Atherosclerotic heart disease of native coronary artery without angina pectoris: Secondary | ICD-10-CM | POA: Insufficient documentation

## 2020-02-10 DIAGNOSIS — H9190 Unspecified hearing loss, unspecified ear: Secondary | ICD-10-CM | POA: Insufficient documentation

## 2020-02-10 DIAGNOSIS — Z8249 Family history of ischemic heart disease and other diseases of the circulatory system: Secondary | ICD-10-CM | POA: Diagnosis not present

## 2020-02-10 DIAGNOSIS — M109 Gout, unspecified: Secondary | ICD-10-CM | POA: Insufficient documentation

## 2020-02-10 DIAGNOSIS — E785 Hyperlipidemia, unspecified: Secondary | ICD-10-CM | POA: Diagnosis not present

## 2020-02-10 DIAGNOSIS — Z9852 Vasectomy status: Secondary | ICD-10-CM | POA: Diagnosis not present

## 2020-02-10 DIAGNOSIS — I10 Essential (primary) hypertension: Secondary | ICD-10-CM | POA: Diagnosis not present

## 2020-02-10 DIAGNOSIS — C098 Malignant neoplasm of overlapping sites of tonsil: Secondary | ICD-10-CM | POA: Insufficient documentation

## 2020-02-10 HISTORY — PX: IR IMAGING GUIDED PORT INSERTION: IMG5740

## 2020-02-10 HISTORY — PX: IR GASTROSTOMY TUBE MOD SED: IMG625

## 2020-02-10 LAB — CBC WITH DIFFERENTIAL/PLATELET
Abs Immature Granulocytes: 0.01 10*3/uL (ref 0.00–0.07)
Basophils Absolute: 0 10*3/uL (ref 0.0–0.1)
Basophils Relative: 0 %
Eosinophils Absolute: 0.1 10*3/uL (ref 0.0–0.5)
Eosinophils Relative: 1 %
HCT: 37 % — ABNORMAL LOW (ref 39.0–52.0)
Hemoglobin: 12.2 g/dL — ABNORMAL LOW (ref 13.0–17.0)
Immature Granulocytes: 0 %
Lymphocytes Relative: 23 %
Lymphs Abs: 1.9 10*3/uL (ref 0.7–4.0)
MCH: 28.2 pg (ref 26.0–34.0)
MCHC: 33 g/dL (ref 30.0–36.0)
MCV: 85.5 fL (ref 80.0–100.0)
Monocytes Absolute: 0.5 10*3/uL (ref 0.1–1.0)
Monocytes Relative: 6 %
Neutro Abs: 5.8 10*3/uL (ref 1.7–7.7)
Neutrophils Relative %: 70 %
Platelets: 185 10*3/uL (ref 150–400)
RBC: 4.33 MIL/uL (ref 4.22–5.81)
RDW: 13.6 % (ref 11.5–15.5)
WBC: 8.3 10*3/uL (ref 4.0–10.5)
nRBC: 0 % (ref 0.0–0.2)

## 2020-02-10 LAB — PROTIME-INR
INR: 1.1 (ref 0.8–1.2)
Prothrombin Time: 13.5 seconds (ref 11.4–15.2)

## 2020-02-10 MED ORDER — FENTANYL CITRATE (PF) 100 MCG/2ML IJ SOLN
INTRAMUSCULAR | Status: AC
  Filled 2020-02-10: qty 2

## 2020-02-10 MED ORDER — CEFAZOLIN SODIUM-DEXTROSE 2-4 GM/100ML-% IV SOLN
INTRAVENOUS | Status: AC
  Administered 2020-02-10: 2 g via INTRAVENOUS
  Filled 2020-02-10: qty 100

## 2020-02-10 MED ORDER — IOHEXOL 300 MG/ML  SOLN: 100.0000 mL | Freq: Once | INTRAMUSCULAR | Status: DC | PRN

## 2020-02-10 MED ORDER — HYDROCODONE-ACETAMINOPHEN 5-325 MG PO TABS: 1.0000 | ORAL_TABLET | ORAL | Status: DC | PRN

## 2020-02-10 MED ORDER — LIDOCAINE HCL 1 % IJ SOLN
INTRAMUSCULAR | Status: DC | PRN
  Administered 2020-02-10: 5 mL via INTRADERMAL

## 2020-02-10 MED ORDER — IOHEXOL 300 MG/ML  SOLN
50.0000 mL | Freq: Once | INTRAMUSCULAR | Status: AC | PRN
  Administered 2020-02-10: 15 mL

## 2020-02-10 MED ORDER — FENTANYL CITRATE (PF) 100 MCG/2ML IJ SOLN
INTRAMUSCULAR | Status: AC | PRN
  Administered 2020-02-10 (×2): 50 ug via INTRAVENOUS

## 2020-02-10 MED ORDER — MIDAZOLAM HCL 2 MG/2ML IJ SOLN
INTRAMUSCULAR | Status: AC | PRN
  Administered 2020-02-10 (×2): 1 mg via INTRAVENOUS

## 2020-02-10 MED ORDER — MIDAZOLAM HCL 2 MG/2ML IJ SOLN
INTRAMUSCULAR | Status: AC
  Filled 2020-02-10: qty 2

## 2020-02-10 MED ORDER — HEPARIN SOD (PORK) LOCK FLUSH 100 UNIT/ML IV SOLN
INTRAVENOUS | Status: AC
  Filled 2020-02-10: qty 5

## 2020-02-10 MED ORDER — CEFAZOLIN SODIUM-DEXTROSE 2-4 GM/100ML-% IV SOLN: 2.0000 g | INTRAVENOUS | Status: AC

## 2020-02-10 MED ORDER — GLUCAGON HCL RDNA (DIAGNOSTIC) 1 MG IJ SOLR
INTRAMUSCULAR | Status: AC
  Filled 2020-02-10: qty 1

## 2020-02-10 MED ORDER — HEPARIN SOD (PORK) LOCK FLUSH 100 UNIT/ML IV SOLN
INTRAVENOUS | Status: AC | PRN
  Administered 2020-02-10: 500 [IU] via INTRAVENOUS

## 2020-02-10 MED ORDER — SODIUM CHLORIDE 0.9 % IV SOLN: INTRAVENOUS | Status: DC

## 2020-02-10 MED ORDER — LIDOCAINE HCL 1 % IJ SOLN
INTRAMUSCULAR | Status: AC
  Filled 2020-02-10: qty 40

## 2020-02-10 MED ORDER — LIDOCAINE HCL 1 % IJ SOLN
INTRAMUSCULAR | Status: AC | PRN
  Administered 2020-02-10: 20 mL

## 2020-02-10 NOTE — Sedation Documentation (Signed)
Percutaneous gastrostomy tube insertion start time 1245.  1 Mg Versed, 50 mcg Fentanyl administered at 1246.

## 2020-02-10 NOTE — Consult Note (Signed)
Chief Complaint: Patient was seen in consultation today for Port-A-Cath and gastrostomy tube placements  Referring Physician(s): Gudena,Vinay  Supervising Physician: Aletta Edouard  Patient Status: Omar Christian - Out-pt  History of Present Illness: Omar Christian is a 64 y.o. 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  Additional medical history as below.  Past Medical History:  Diagnosis Date  . Abnormal findings on cardiac catheterization 09/30/15   [facility]  . Abnormal myocardial perfusion study 09/24/15   [facility]  . CAD, multiple vessel 09/30/15   per CATH @ [facility]/DR.PARACHOS  . Coronary artery disease   . GERD (gastroesophageal reflux disease)   . Gout   . H/O echocardiogram 09/24/15   [place]  . Hard of hearing   . History of pneumonia   . Hyperlipidemia   . Hypertension   . Pneumonia   . Progressive angina ([place])   . Tonsil cancer ([place])    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    Past Surgical History:  Procedure Laterality Date  . CARDIAC CATHETERIZATION N/A 09/30/2015   Procedure: Left Heart Cath and Coronary Angiography;  Surgeon: [name], MD;  Location: [place] [facility];  Service: Cardiovascular;  Laterality: N/A;  . CORONARY ARTERY BYPASS GRAFT N/A 10/08/2015   Procedure: CORONARY ARTERY BYPASS GRAFTING time 4 using left internal mammary and right greater saphenous vein harvested by endovein;  Surgeon: [name], MD;  Location: [place];  Service: Open Heart Surgery;  Laterality: N/A;  . CORONARY ARTERY BYPASS GRAFT    . MULTIPLE EXTRACTIONS WITH ALVEOLOPLASTY N/A 02/04/2020   Procedure: Extraction of tooth #'s [phone], and 32 with alveoloplasty and gross debridement of remaining teeth.;  Surgeon: [name], DDS;  Location: [place];  Service: Oral Surgery;  Laterality: N/A;  . TEE WITHOUT  CARDIOVERSION N/A 10/08/2015   Procedure: TRANSESOPHAGEAL ECHOCARDIOGRAM (TEE);  Surgeon: [name], MD;  Location: [place];  Service: Open Heart Surgery;  Laterality: N/A;  . VASECTOMY    . WISDOM TOOTH EXTRACTION      Allergies: Patient has no known allergies.  Medications: Prior to Admission medications   Medication Sig Start Date End Date Taking? Authorizing Provider  acetaminophen (TYLENOL) 650 MG CR tablet Take 650 mg by mouth daily.     [provider]  allopurinol (ZYLOPRIM) 100 MG tablet Take 1 tablet (100 mg total) by mouth daily. 07/22/19   Birdie Sons, MD  atorvastatin (LIPITOR) 20 MG tablet Take 1 tablet (20 mg total) by mouth daily. 09/25/19   Chrismon, Vickki Muff, PA  dexamethasone (DECADRON) 4 MG tablet Take 2 tablets (8 mg total) by mouth daily. Take daily for 3 days after chemo. Take with food. 02/02/20   [name], MD  lidocaine-prilocaine (EMLA) cream Apply to affected area once 02/02/20   [name], MD  LORazepam (ATIVAN) 0.5 MG tablet Take 1 tablet (0.5 mg total) by mouth at bedtime as needed (Nausea or vomiting). 02/02/20   [name], MD  ondansetron (ZOFRAN) 8 MG tablet Take 1 tablet (8 mg total) by mouth 2 (two) times daily as needed. Start on the third day after chemotherapy. 02/02/20   [name], MD  oxyCODONE-acetaminophen (PERCOCET/ROXICET) 5-325 MG tablet Take 1 tablet by mouth every 4 (four) hours as needed.  01/14/20   [provider]  prochlorperazine (COMPAZINE) 10 MG tablet Take 1 tablet (10 mg  total) by mouth every 6 (six) hours as needed (Nausea or vomiting). 02/02/20   [name], MD  sodium fluoride (PREVIDENT 5000 PLUS) 1.1 % CREA dental cream Apply to tooth brush. Brush teeth for 2 minutes. Spit out excess-DO NOT swallow. DO NOT rinse afterwards. Repeat nightly. 01/28/20   [name], DDS     Family History  Problem Relation Age of Onset  . Heart disease Mother   . Hypertension Mother   . Alcohol abuse Father     . Throat cancer Father   . Muscular dystrophy Brother   . Muscular dystrophy Brother     Social History   Socioeconomic History  . Marital status: Married    Spouse name: Not on file  . Number of children: 2  . Years of education: Not on file  . Highest education level: Not on file  Occupational History  . Not on file  Tobacco Use  . Smoking status: Never Smoker  . Smokeless tobacco: Never Used  Vaping Use  . Vaping Use: Never used  Substance and Sexual Activity  . Alcohol use: No  . Drug use: No  . Sexual activity: Not Currently    Birth control/protection: Surgical    Comment: Vasectomy  Other Topics Concern  . Not on file  Social History Narrative  . Not on file   Social Determinants of Health   Financial Resource Strain: Low Risk   . Difficulty of Paying Living Expenses: Not very hard  Food Insecurity:   . Worried About Charity fundraiser in the Last Year:   . Arboriculturist in the Last Year:   Transportation Needs: No Transportation Needs  . Lack of Transportation (Medical): No  . Lack of Transportation (Non-Medical): No  Physical Activity:   . Days of Exercise per Week:   . Minutes of Exercise per Session:   Stress:   . Feeling of Stress :   Social Connections:   . Frequency of Communication with Friends and Family:   . Frequency of Social Gatherings with Friends and Family:   . Attends Religious Services:   . Active Member of Clubs or Organizations:   . Attends Archivist Meetings:   Marland Kitchen Marital Status:       Review of Systems currently denies fever, headache, chest pain, dyspnea, abdominal/back pain, nausea, vomiting or bleeding.  He does have increased ulcerations, occasional cough, he is hard of hearing, also with some dysphagia  Vital Signs: BP (!) 155/96   Pulse 73   Temp 98.4 F (36.9 C) (Oral)   Resp 18   SpO2 97%   Physical Exam awake, alert.  Hard of hearing.  Chest clear to auscultation bilaterally.  Heart with regular  rate and rhythm.  Abdomen soft, positive bowel sounds, nontender.  No lower extremity edema.  Imaging: NM PET Image Initial (PI) Skull Base To Thigh  Result Date: 01/26/2020 CLINICAL DATA:  Initial treatment strategy for right head/neck cancer. EXAM: NUCLEAR MEDICINE PET SKULL BASE TO THIGH TECHNIQUE: 11.5 mCi F-18 FDG was injected intravenously. Full-ring PET imaging was performed from the skull base to thigh after the radiotracer. CT data was obtained and used for attenuation correction and anatomic localization. Fasting blood glucose: 123 mg/dl COMPARISON:  Maxillofacial CT dated 01/11/2020. CT abdomen/pelvis dated 01/09/2017. FINDINGS: Mediastinal blood pool activity: SUV max 3.0 Liver activity: SUV max NA NECK: Right tonsillar mass extending to the right base of tongue and crossing midline to the left, measuring approximately  4.9 x 3.7 cm in maximal axial measurement (series 4/image 29), max SUV 18.3, compatible with primary head/neck cancer. 2.5 cm short axis right level 2 node (series 4/image 32), max SUV 12.5. Additional 2.1 cm necrotic right level 2 node (series 4/image 37), without appreciable hypermetabolism given necrosis, but also suspicious for metastasis. Incidental CT findings: none CHEST: No hypermetabolic thoracic lymphadenopathy. No suspicious pulmonary nodules. Incidental CT findings: Mild atherosclerotic calcifications the descending thoracic aorta. 3 vessel coronary atherosclerosis. Postsurgical changes related to prior CABG. ABDOMEN/PELVIS: No abnormal hypermetabolism in the liver, spleen, pancreas, or adrenal glands. No hypermetabolic abdominopelvic lymphadenopathy. Incidental CT findings: 3.0 x 2.3 cm calcified jejunal mesenteric lesion in the left mid abdomen (series 4/image 139), unchanged from 2018 and non FDG avid, of uncertain etiology but indolent and therefore likely of no clinical significance given the additional findings described above. Mild hepatic steatosis with focal fatty  sparing along the gallbladder fossa. Cholelithiasis, without associated inflammatory changes. Nonobstructing right renal calculi measuring 2-3 mm (series 4/image 139). Atherosclerotic calcifications of the abdominal aorta and branch vessels. SKELETON: No focal hypermetabolic activity to suggest skeletal metastasis. Incidental CT findings: Degenerative changes of the visualized thoracolumbar spine. IMPRESSION: Right tonsillar mass extending to the bilateral tongue base, measuring at least 4.9 cm in maximal axial dimension, better evaluated on recent CT. This corresponds to the patient's known primary head/neck cancer. Associated right cervical nodal metastases. No evidence of distant metastases. Electronically Signed   By: Julian Hy M.D.   On: 01/26/2020 15:29    Labs:  CBC: Recent Labs    09/19/19 0950 01/11/20 0823 02/03/20 1320 02/05/20 0811  WBC 5.6 5.6 5.9 7.2  HGB 14.1 13.6 11.9* 11.7*  HCT 41.1 41.2 37.1* 36.4*  PLT 148* 154 180 181    COAGS: No results for input(s): INR, APTT in the last 8760 hours.  BMP: Recent Labs    09/19/19 0950 01/11/20 0823 02/03/20 1320 02/05/20 0811  NA 141 140 138 138  K 3.9 4.3 4.7 3.9  CL 104 105 102 100  CO2 21 22 27 25   GLUCOSE 118* 140* 218* 219*  BUN 9 19 14 10   CALCIUM 9.1 9.4 9.2 9.2  CREATININE 0.82 0.93 0.95 0.98  GFRNONAA 95 >60 >60 >60  GFRAA 110 >60 >60 >60    LIVER FUNCTION TESTS: Recent Labs    09/19/19 0950  BILITOT 0.6  AST 24  ALT 29  ALKPHOS 80  PROT 6.6  ALBUMIN 4.3    TUMOR MARKERS: No results for input(s): AFPTM, CEA, CA199, CHROMGRNA in the last 8760 hours.  Assessment and Plan:  64 y.o. 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  Details/risks of procedures, including but not limited to, internal bleeding, infection, injury to adjacent structures discussed with  patient and daughter with their understanding and consent.   Thank you for this interesting consult.  I greatly enjoyed meeting Tab Rylee and look forward to participating in their care.  A copy of this report was sent to the requesting provider on this date.  Electronically Signed: D. Rowe Robert, PA-C 02/10/2020, 10:18 AM   I spent a total of 30 minutes in face to face in clinical consultation, greater than 50% of which was counseling/coordinating care for Port-A-Cath and gastrostomy tube placements

## 2020-02-10 NOTE — Procedures (Signed)
Interventional Radiology Procedure Note  Procedure: Gastrostomy tube placement  Complications: None  Estimated Blood Loss: < 10 mL  Findings: 20 Fr bumper retention gastrostomy tube placed with tip in body of stomach.   Venetia Night. Kathlene Cote, M.D Pager:  [phone]

## 2020-02-10 NOTE — Sedation Documentation (Signed)
Port a cath insertion procedure start time 1156 and end time is 1235.  1 Mg Versed and 50 mcg Fentanyl administered.  Patient received 2 G Ancef. Patient prep for G tube insertion start time is 1236.

## 2020-02-10 NOTE — Discharge Instructions (Signed)
For questions /concerns may call Interventional Radiology at [phone]  You may remove your dressing and shower tomorrow afternoon  DO NOT use EMLA cream for 2 weeks after port placement as the cream will remove surgical glue on your incision.    Implanted Port Insertion, Care After This sheet gives you information about how to care for yourself after your procedure. Your health care provider may also give you more specific instructions. If you have problems or questions, contact your health care provider. What can I expect after the procedure? After the procedure, it is common to have:  Discomfort at the port insertion site.  Bruising on the skin over the port. This should improve over 3-4 days. Follow these instructions at home: [facility] care  After your port is placed, you will get a manufacturer's information card. The card has information about your port. Keep this card with you at all times.  Take care of the port as told by your health care provider. Ask your health care provider if you or a family member can get training for taking care of the port at home. A home health care nurse may also take care of the port.  Make sure to remember what type of port you have. Incision care      Follow instructions from your health care provider about how to take care of your port insertion site. Make sure you: ? Wash your hands with soap and water before and after you change your bandage (dressing). If soap and water are not available, use hand sanitizer. ? Change your dressing as told by your health care provider. ? Leave stitches (sutures), skin glue, or adhesive strips in place. These skin closures may need to stay in place for 2 weeks or longer. If adhesive strip edges start to loosen and curl up, you may trim the loose edges. Do not remove adhesive strips completely unless your health care provider tells you to do that.  Check your port insertion site every day for signs of infection.  Check for: ? Redness, swelling, or pain. ? Fluid or blood. ? Warmth. ? Pus or a bad smell. Activity  Return to your normal activities as told by your health care provider. Ask your health care provider what activities are safe for you.  Do not lift anything that is heavier than 10 lb (4.5 kg), or the limit that you are told, until your health care provider says that it is safe. General instructions  Take over-the-counter and prescription medicines only as told by your health care provider.  Do not take baths, swim, or use a hot tub until your health care provider approves. Ask your health care provider if you may take showers. You may only be allowed to take sponge baths.  Do not drive for 24 hours if you were given a sedative during your procedure.  Wear a medical alert bracelet in case of an emergency. This will tell any health care providers that you have a port.  Keep all follow-up visits as told by your health care provider. This is important. Contact a health care provider if:  You cannot flush your port with saline as directed, or you cannot draw blood from the port.  You have a fever or chills.  You have redness, swelling, or pain around your port insertion site.  You have fluid or blood coming from your port insertion site.  Your port insertion site feels warm to the touch.  You have pus or a bad  smell coming from the port insertion site. Get help right away if:  You have chest pain or shortness of breath.  You have bleeding from your port that you cannot control. Summary  Take care of the port as told by your health care provider. Keep the manufacturer's information card with you at all times.  Change your dressing as told by your health care provider.  Contact a health care provider if you have a fever or chills or if you have redness, swelling, or pain around your port insertion site.  Keep all follow-up visits as told by your health care  provider.   Moderate Conscious Sedation, Adult, Care After These instructions provide you with information about caring for yourself after your procedure. Your health care provider may also give you more specific instructions. Your treatment has been planned according to current medical practices, but problems sometimes occur. Call your health care provider if you have any problems or questions after your procedure. What can I expect after the procedure? After your procedure, it is common:  To feel sleepy for several hours.  To feel clumsy and have poor balance for several hours.  To have poor judgment for several hours.  To vomit if you eat too soon. Follow these instructions at home: For at least 24 hours after the procedure:   Do not: ? Participate in activities where you could fall or become injured. ? Drive. ? Use heavy machinery. ? Drink alcohol. ? Take sleeping pills or medicines that cause drowsiness. ? Make important decisions or sign legal documents. ? Take care of children on your own.  Rest. Eating and drinking  Follow the diet recommended by your health care provider.  If you vomit: ? Drink water, juice, or soup when you can drink without vomiting. ? Make sure you have little or no nausea before eating solid foods. General instructions  Have a responsible adult stay with you until you are awake and alert.  Take over-the-counter and prescription medicines only as told by your health care provider.  If you smoke, do not smoke without supervision.  Keep all follow-up visits as told by your health care provider. This is important. Contact a health care provider if:  You keep feeling nauseous or you keep vomiting.  You feel light-headed.  You develop a rash.  You have a fever. Get help right away if:  You have trouble breathing.  Gastrostomy Tube Home Guide, Adult A gastrostomy tube, or G-tube, is a tube that is inserted through the abdomen into the  stomach. The tube is used to give feedings and medicines when a person is unable to eat and drink enough on his or her own. How to care for a G-tube Supplies needed  Saline solution or clean, warm water and soap.  Cotton swab or gauze.  Precut gauze bandage (dressing) and tape, if needed. Instructions 1. Wash your hands with soap and water. 2. If there is a dressing between the person's skin and the tube, remove it. 3. Check the area where the tube enters the skin. Check for problems such as: ? Redness. ? Swelling. ? Pus-like drainage. ? Extra skin growth. 4. Moisten the cotton swab with the saline solution or soap and water mixture. Gently clean around the insertion site. Remove any drainage or crusting. ? When the G-tube is first put in, a normal saline solution or water can be used to clean the skin. ? Mild soap and warm water can be used when the skin  around the G-tube site has healed. 5. If there should be a dressing between the person's skin and the tube, apply it at this time. How to flush a G-tube Flush the G-tube regularly to keep it from clogging. Flush it before and after feedings and as often as told by the health care provider. Supplies needed  Purified or sterile water, warmed. If the person has a weak disease-fighting (immune) system, or if he or she has difficulty fighting off infections (is immunocompromised), use only sterile water. ? If you are unsure about the amount of chemical contaminants in purified or drinking water, use sterile water. ? To purify drinking water by boiling:  Boil water for at least 1 minute. Keep lid over water while it boils. Allow water to cool to room temperature before using.  60cc G-tube syringe. Instructions 1. Wash your hands with soap and water. 2. Draw up 30 mL of warm water in a syringe. 3. Connect the syringe to the tube. 4. Slowly and gently push the water into the tube. G-tube problems and solutions  If the tube comes  out: ? Cover the opening with a clean dressing and tape. ? Call a health care provider right away. ? A health care provider will need to put the tube back in within 4 hours.  If there is skin or scar tissue growing where the tube enters the skin: ? Keep the area clean and dry. ? Secure the tube with tape so that the tube does not move around too much. ? Call a health care provider.  If the tube gets clogged: ? Slowly push warm water into the tube with a large syringe. ? Do not force the fluid into the tube or push an object into the tube. ? If you are not able to unclog the tube, call a health care provider right away. Follow these instructions at home: Feedings  Give feedings at room temperature.  Cover and place unused feedings in the refrigerator.  If feedings are continuous: ? Do not put more than 4 hours worth of feedings in the feeding bag. ? Stop the feedings when you need to give medicine or flush the tube. Be sure to restart the feedings. ? Make sure the person's head is above his or her stomach (upright position). This will prevent choking and discomfort.  Replace feeding bags and syringes as told by the health care provider.  Make sure the person is in the right position during and after feedings: ? During feedings, the person's position should be in the upright position. ? After a noncontinuous feeding (bolus feeding), have the person stay in the upright position for 1 hour. General instructions  Only use syringes made for G-tubes.  Do not pull or put tension on the tube.  Clamp the tube before removing the cap or disconnecting a syringe.  Measure the length of the G-tube every day from the insertion site to the end of the tube.  If the person's G-tube has a balloon, check the fluid in the balloon every week. The amount of fluid that should be in the balloon can be found in the manufacturer's specifications.  Make sure the person takes care of his or her oral  health, such as by brushing his or her teeth.  Remove excess air from the G-tube as told by the person's health care provider. This is called "venting."  Keep the area where the tube enters the skin clean and dry.  Do not push feedings, medicines,  or flushes rapidly. Contact a health care provider if:  The person with the tube has any of these problems: ? Constipation. ? Fever.  There is a large amount of fluid or mucus-like liquid leaking from the tube.  Skin or scar tissue appears to be growing where the tube enters the skin.  The length of tube from the insertion site to the G-tube gets longer. Get help right away if:  The person with the tube has any of these problems: ? Severe abdominal pain. ? Severe tenderness. ? Severe bloating. ? Nausea. ? Vomiting. ? Trouble breathing. ? Shortness of breath.  Any of these problems happen in the area where the tube enters the skin: ? Redness, irritation, swelling, or soreness. ? Pus-like discharge. ? A bad smell.  The tube is clogged and cannot be flushed.  The tube comes out. Summary  A gastrostomy tube, or G-tube, is a tube that is inserted through the abdomen into the stomach. The tube is used to give feedings and medicines when a person is unable to eat and drink enough on his or her own.  Check and clean the insertion site daily as told by the person's health care provider.  Flush the G-tube regularly to keep it from clogging. Flush it before and after feedings and as often as told by the person's health care provider.  Keep the area where the tube enters the skin clean and dry. This information is not intended to replace advice given to you by your health care provider. Make sure you discuss any questions you have with your health care provider. Document Revised: 06/01/2017 Document Reviewed: 08/14/2016 Elsevier Patient Education  2020 [employer].

## 2020-02-10 NOTE — Procedures (Signed)
Interventional Radiology Procedure Note  Procedure: Single Lumen Power Port Placement    Access:  Right IJ vein.  Findings: Catheter tip positioned at SVC/RA junction. Port is ready for immediate use.   Complications: None  EBL: < 10 mL  Recommendations:  - Ok to shower in 24 hours - Do not submerge for 7 days - Routine line care   Sonji Starkes T. Jamee Pacholski, M.D Pager:  [phone]   

## 2020-02-10 NOTE — Discharge Instructions (Signed)
For questions /concerns may call Interventional Radiology at [phone]  You may remove your dressing and shower tomorrow afternoon  DO NOT use EMLA cream for 2 weeks after port placement as the cream will remove surgical glue on your incision.     Implanted Port Insertion, Care After This sheet gives you information about how to care for yourself after your procedure. Your health care provider may also give you more specific instructions. If you have problems or questions, contact your health care provider. What can I expect after the procedure? After the procedure, it is common to have:  Discomfort at the port insertion site.  Bruising on the skin over the port. This should improve over 3-4 days. Follow these instructions at home: [facility] care  After your port is placed, you will get a manufacturer's information card. The card has information about your port. Keep this card with you at all times.  Take care of the port as told by your health care provider. Ask your health care provider if you or a family member can get training for taking care of the port at home. A home health care nurse may also take care of the port.  Make sure to remember what type of port you have. Incision care      Follow instructions from your health care provider about how to take care of your port insertion site. Make sure you: ? Wash your hands with soap and water before and after you change your bandage (dressing). If soap and water are not available, use hand sanitizer. ? Change your dressing as told by your health care provider. ? Leave stitches (sutures), skin glue, or adhesive strips in place. These skin closures may need to stay in place for 2 weeks or longer. If adhesive strip edges start to loosen and curl up, you may trim the loose edges. Do not remove adhesive strips completely unless your health care provider tells you to do that.  Check your port insertion site every day for signs of  infection. Check for: ? Redness, swelling, or pain. ? Fluid or blood. ? Warmth. ? Pus or a bad smell. Activity  Return to your normal activities as told by your health care provider. Ask your health care provider what activities are safe for you.  Do not lift anything that is heavier than 10 lb (4.5 kg), or the limit that you are told, until your health care provider says that it is safe. General instructions  Take over-the-counter and prescription medicines only as told by your health care provider.  Do not take baths, swim, or use a hot tub until your health care provider approves. Ask your health care provider if you may take showers. You may only be allowed to take sponge baths.  Do not drive for 24 hours if you were given a sedative during your procedure.  Wear a medical alert bracelet in case of an emergency. This will tell any health care providers that you have a port.  Keep all follow-up visits as told by your health care provider. This is important. Contact a health care provider if:  You cannot flush your port with saline as directed, or you cannot draw blood from the port.  You have a fever or chills.  You have redness, swelling, or pain around your port insertion site.  You have fluid or blood coming from your port insertion site.  Your port insertion site feels warm to the touch.  You have pus or a  bad smell coming from the port insertion site. Get help right away if:  You have chest pain or shortness of breath.  You have bleeding from your port that you cannot control. Summary  Take care of the port as told by your health care provider. Keep the manufacturer's information card with you at all times.  Change your dressing as told by your health care provider.  Contact a health care provider if you have a fever or chills or if you have redness, swelling, or pain around your port insertion site.  Keep all follow-up visits as told by your health care  provider. This information is not intended to replace advice given to you by your health care provider. Make sure you discuss any questions you have with your health care provider. Document Revised: 01/15/2018 Document Reviewed: 01/15/2018 Elsevier Patient Education  [place]. Moderate Conscious Sedation, Adult, Care After These instructions provide you with information about caring for yourself after your procedure. Your health care provider may also give you more specific instructions. Your treatment has been planned according to current medical practices, but problems sometimes occur. Call your health care provider if you have any problems or questions after your procedure. What can I expect after the procedure? After your procedure, it is common:  To feel sleepy for several hours.  To feel clumsy and have poor balance for several hours.  To have poor judgment for several hours.  To vomit if you eat too soon. Follow these instructions at home: For at least 24 hours after the procedure:   Do not: ? Participate in activities where you could fall or become injured. ? Drive. ? Use heavy machinery. ? Drink alcohol. ? Take sleeping pills or medicines that cause drowsiness. ? Make important decisions or sign legal documents. ? Take care of children on your own.  Rest. Eating and drinking  Follow the diet recommended by your health care provider.  If you vomit: ? Drink water, juice, or soup when you can drink without vomiting. ? Make sure you have little or no nausea before eating solid foods. General instructions  Have a responsible adult stay with you until you are awake and alert.  Take over-the-counter and prescription medicines only as told by your health care provider.  If you smoke, do not smoke without supervision.  Keep all follow-up visits as told by your health care provider. This is important. Contact a health care provider if:  You keep feeling  nauseous or you keep vomiting.  You feel light-headed.  You develop a rash.  You have a fever. Get help right away if:  You have trouble breathing. This information is not intended to replace advice given to you by your health care provider. Make sure you discuss any questions you have with your health care provider. Document Revised: 06/01/2017 Document Reviewed: 10/09/2015 Elsevier Patient Education  [place]. Gastrostomy Tube Home Guide, Adult A gastrostomy tube, or G-tube, is a tube that is inserted through the abdomen into the stomach. The tube is used to give feedings and medicines when a person is unable to eat and drink enough on his or her own. How to care for a G-tube Supplies needed  Saline solution or clean, warm water and soap.  Cotton swab or gauze.  Precut gauze bandage (dressing) and tape, if needed. Instructions 1. Wash your hands with soap and water. 2. If there is a dressing between the person's skin and the tube, remove it. 3. Check  the area where the tube enters the skin. Check for problems such as: ? Redness. ? Swelling. ? Pus-like drainage. ? Extra skin growth. 4. Moisten the cotton swab with the saline solution or soap and water mixture. Gently clean around the insertion site. Remove any drainage or crusting. ? When the G-tube is first put in, a normal saline solution or water can be used to clean the skin. ? Mild soap and warm water can be used when the skin around the G-tube site has healed. 5. If there should be a dressing between the person's skin and the tube, apply it at this time. How to flush a G-tube Flush the G-tube regularly to keep it from clogging. Flush it before and after feedings and as often as told by the health care provider. Supplies needed  Purified or sterile water, warmed. If the person has a weak disease-fighting (immune) system, or if he or she has difficulty fighting off infections (is immunocompromised), use only  sterile water. ? If you are unsure about the amount of chemical contaminants in purified or drinking water, use sterile water. ? To purify drinking water by boiling:  Boil water for at least 1 minute. Keep lid over water while it boils. Allow water to cool to room temperature before using.  60cc G-tube syringe. Instructions 1. Wash your hands with soap and water. 2. Draw up 30 mL of warm water in a syringe. 3. Connect the syringe to the tube. 4. Slowly and gently push the water into the tube. G-tube problems and solutions  If the tube comes out: ? Cover the opening with a clean dressing and tape. ? Call a health care provider right away. ? A health care provider will need to put the tube back in within 4 hours.  If there is skin or scar tissue growing where the tube enters the skin: ? Keep the area clean and dry. ? Secure the tube with tape so that the tube does not move around too much. ? Call a health care provider.  If the tube gets clogged: ? Slowly push warm water into the tube with a large syringe. ? Do not force the fluid into the tube or push an object into the tube. ? If you are not able to unclog the tube, call a health care provider right away. Follow these instructions at home: Feedings  Give feedings at room temperature.  Cover and place unused feedings in the refrigerator.  If feedings are continuous: ? Do not put more than 4 hours worth of feedings in the feeding bag. ? Stop the feedings when you need to give medicine or flush the tube. Be sure to restart the feedings. ? Make sure the person's head is above his or her stomach (upright position). This will prevent choking and discomfort.  Replace feeding bags and syringes as told by the health care provider.  Make sure the person is in the right position during and after feedings: ? During feedings, the person's position should be in the upright position. ? After a noncontinuous feeding (bolus feeding), have  the person stay in the upright position for 1 hour. General instructions  Only use syringes made for G-tubes.  Do not pull or put tension on the tube.  Clamp the tube before removing the cap or disconnecting a syringe.  Measure the length of the G-tube every day from the insertion site to the end of the tube.  If the person's G-tube has a balloon, check the  fluid in the balloon every week. The amount of fluid that should be in the balloon can be found in the manufacturer's specifications.  Make sure the person takes care of his or her oral health, such as by brushing his or her teeth.  Remove excess air from the G-tube as told by the person's health care provider. This is called "venting."  Keep the area where the tube enters the skin clean and dry.  Do not push feedings, medicines, or flushes rapidly. Contact a health care provider if:  The person with the tube has any of these problems: ? Constipation. ? Fever.  There is a large amount of fluid or mucus-like liquid leaking from the tube.  Skin or scar tissue appears to be growing where the tube enters the skin.  The length of tube from the insertion site to the G-tube gets longer. Get help right away if:  The person with the tube has any of these problems: ? Severe abdominal pain. ? Severe tenderness. ? Severe bloating. ? Nausea. ? Vomiting. ? Trouble breathing. ? Shortness of breath.  Any of these problems happen in the area where the tube enters the skin: ? Redness, irritation, swelling, or soreness. ? Pus-like discharge. ? A bad smell.  The tube is clogged and cannot be flushed.  The tube comes out. Summary  A gastrostomy tube, or G-tube, is a tube that is inserted through the abdomen into the stomach. The tube is used to give feedings and medicines when a person is unable to eat and drink enough on his or her own.  Check and clean the insertion site daily as told by the person's health care  provider.  Flush the G-tube regularly to keep it from clogging. Flush it before and after feedings and as often as told by the person's health care provider.  Keep the area where the tube enters the skin clean and dry. This information is not intended to replace advice given to you by your health care provider. Make sure you discuss any questions you have with your health care provider. Document Revised: 06/01/2017 Document Reviewed: 08/14/2016 Elsevier Patient Education  2020 [employer].

## 2020-02-10 NOTE — Sedation Documentation (Signed)
G Tube placement end time 1256.

## 2020-02-11 ENCOUNTER — Inpatient Hospital Stay: Payer: 59 | Admitting: Hematology and Oncology

## 2020-02-11 ENCOUNTER — Inpatient Hospital Stay: Payer: 59

## 2020-02-11 ENCOUNTER — Inpatient Hospital Stay: Payer: 59 | Admitting: Nutrition

## 2020-02-11 NOTE — Progress Notes (Signed)
Pharmacist Chemotherapy Monitoring - Initial Assessment    Anticipated start date: 02/17/20   Regimen:  . Are orders appropriate based on the patient's diagnosis, regimen, and cycle? Yes . Does the plan date match the patient's scheduled date? Yes . Is the sequencing of drugs appropriate? Yes . Are the premedications appropriate for the patient's regimen? Yes . Prior Authorization for treatment is: Pending o If applicable, is the correct biosimilar selected based on the patient's insurance? not applicable  Organ Function and Labs: Marland Kitchen Are dose adjustments needed based on the patient's renal function, hepatic function, or hematologic function? No . Are appropriate labs ordered prior to the start of patient's treatment? Yes . Other organ system assessment, if indicated: cisplatin: baseline audiogram . The following baseline labs, if indicated, have been ordered: N/A  Dose Assessment: . Are the drug doses appropriate? Yes . Are the following correct: o Drug concentrations Yes o IV fluid compatible with drug Yes o Administration routes Yes o Timing of therapy Yes . If applicable, does the patient have documented access for treatment and/or plans for port-a-cath placement? not applicable . If applicable, have lifetime cumulative doses been properly documented and assessed? not applicable Lifetime Dose Tracking  No doses have been documented on this patient for the following tracked chemicals: Doxorubicin, Epirubicin, Idarubicin, Daunorubicin, Mitoxantrone, Bleomycin, Oxaliplatin, Carboplatin, Liposomal Doxorubicin  o   Toxicity Monitoring/Prevention: . The patient has the following take home antiemetics prescribed: N/A . The patient has the following take home medications prescribed: N/A . Medication allergies and previous infusion related reactions, if applicable, have been reviewed and addressed. Yes . The patient's current medication list has been assessed for drug-drug interactions  with their chemotherapy regimen. no significant drug-drug interactions were identified on review.  Order Review: . Are the treatment plan orders signed? Yes . Is the patient scheduled to see a provider prior to their treatment? Yes  I verify that I have reviewed each item in the above checklist and answered each question accordingly.  Sabiha Sura K 02/11/2020 8:46 AM

## 2020-02-16 ENCOUNTER — Ambulatory Visit: Payer: Self-pay | Admitting: *Deleted

## 2020-02-16 NOTE — Progress Notes (Signed)
Patient Care Team: Chrismon, Vickki Muff, PA as PCP - General (Family Medicine) [name], MD as Consulting Physician (Cardiothoracic Surgery) [name], MD as Consulting Physician (Cardiology) Serena Colonel, RN as [place] Management [name], MD as Consulting Physician (Hematology and Oncology) [name], MD as Attending Physician (Radiation Oncology) [name], [name], RN as Oncology Nurse Navigator [name], [name], [place] as Speech Language Pathologist (Speech Pathology) [name], DDS as Consulting Physician (Dentistry) [name], [name], PT as Physical Therapist (Physical Therapy) [name], LCSW as Social Worker (General Practice) [name], LCSW as Social Worker  DIAGNOSIS:    ICD-10-CM   1. 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  Two right cervical lymph nodes metastases measuring 2.5 cm and 2.1 cm   02/10/2020 -  Chemotherapy   The patient had dexamethasone (DECADRON) 4 MG tablet, 8 mg, Oral, Daily, 1 of 1 cycle, Start date: 02/02/2020, End date: -- palonosetron (ALOXI) injection 0.25 mg, 0.25 mg, Intravenous,  Once, 0 of 3 cycles CISplatin (PLATINOL) 173 mg in sodium chloride 0.9 % 500 mL chemo infusion, 75 mg/m2 = 173 mg (100 % of original dose 75 mg/m2), Intravenous,  Once, 0 of 3 cycles Dose modification: 75 mg/m2 (original dose 75 mg/m2, Cycle 1, Reason: Provider Judgment) fosaprepitant (EMEND) 150 mg in sodium chloride 0.9 % 145 mL IVPB, 150 mg, Intravenous,  Once, 0 of 3 cycles  for chemotherapy treatment.    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 with above-mentioned history of tonsil cancer currently on treatment with radiation and chemotherapy with cisplatin. He underwent a dental extraction of 6 teeth on 02/04/20. His port and G-tube were placed on 02/10/20. She presents to the clinic today for treatment.  He had dental extractions done and slightly sore from that.  It has healed fairly well.  He also had a PEG tube placement.  ALLERGIES:  has No Known Allergies.  MEDICATIONS:  Current Outpatient Medications  Medication Sig Dispense Refill   acetaminophen (TYLENOL) 650 MG CR tablet Take 650 mg by mouth daily.      allopurinol (ZYLOPRIM) 100 MG tablet Take 1 tablet (100 mg total) by mouth daily. 30 tablet 12   atorvastatin (LIPITOR) 20 MG tablet Take 1 tablet (20 mg total) by mouth daily. 90 tablet 3   dexamethasone (DECADRON) 4 MG tablet Take 2 tablets (8 mg total) by mouth daily. Take daily for 3 days after chemo. Take with food. 20 tablet 0   lidocaine-prilocaine (EMLA) cream Apply to affected area once 30 g 3   LORazepam (ATIVAN) 0.5 MG tablet Take 1 tablet (0.5 mg total)  by mouth at bedtime as needed (Nausea or vomiting). 30 tablet 0   ondansetron (ZOFRAN) 8 MG tablet Take 1 tablet (8 mg total) by mouth 2 (two) times daily as needed. Start on the third day after chemotherapy. 30 tablet 1   oxyCODONE-acetaminophen (PERCOCET/ROXICET) 5-325 MG tablet Take 1 tablet by mouth every 4 (four) hours as needed.      prochlorperazine (COMPAZINE) 10 MG tablet Take 1  tablet (10 mg total) by mouth every 6 (six) hours as needed (Nausea or vomiting). 30 tablet 1   sodium fluoride (PREVIDENT 5000 PLUS) 1.1 % CREA dental cream Apply to tooth brush. Brush teeth for 2 minutes. Spit out excess-DO NOT swallow. DO NOT rinse afterwards. Repeat nightly. 51 g prn   No current facility-administered medications for this visit.    PHYSICAL EXAMINATION: ECOG PERFORMANCE STATUS: 1 - Symptomatic but completely ambulatory  Vitals:   02/17/20 0814  BP: (!) 172/88  Pulse: (!) 55  Resp: 20  Temp: 98.5 F (36.9 C)  SpO2: 100%   Filed Weights   02/17/20 0814  Weight: 219 lb 8 oz (99.6 kg)    LABORATORY DATA:  I have reviewed the data as listed CMP Latest Ref Rng & Units 02/05/2020 02/03/2020 01/11/2020  Glucose 70 - 99 mg/dL 219(H) 218(H) 140(H)  BUN 8 - 23 mg/dL 10 14 19   Creatinine 0.61 - 1.24 mg/dL 0.98 0.95 0.93  Sodium 135 - 145 mmol/L 138 138 140  Potassium 3.5 - 5.1 mmol/L 3.9 4.7 4.3  Chloride 98 - 111 mmol/L 100 102 105  CO2 22 - 32 mmol/L 25 27 22   Calcium 8.9 - 10.3 mg/dL 9.2 9.2 9.4  Total Protein 6.0 - 8.5 g/dL - - -  Total Bilirubin 0.0 - 1.2 mg/dL - - -  Alkaline Phos 39 - 117 IU/L - - -  AST 0 - 40 IU/L - - -  ALT 0 - 44 IU/L - - -    Lab Results  Component Value Date   WBC 8.3 02/10/2020   HGB 12.2 (L) 02/10/2020   HCT 37.0 (L) 02/10/2020   MCV 85.5 02/10/2020   PLT 185 02/10/2020   NEUTROABS 5.8 02/10/2020    ASSESSMENT & PLAN:  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  Two right cervical lymph nodes metastases measuring 2.5 cm and 2.1 cm T3N2BM0 stage IVa HPV status: Positive  Treatment plan: Concurrent chemoradiation with cisplatin every 3 weeks x4.  Today is cycle 1 day 1 Chemo education completed Hearing test completed: Hearing impairment issues.  Patient understands the cisplatin could make  his hearing much worse. NG tube and port have been placed. Dietitian consulted Weekly IV fluids will be given with electrolyte replacement as needed  Hypertension: We will add metoprolol today. History of gout with possible flareup on his leg: I sent a prescription for colchicine.  I discussed with him that with tumor lysis there could be additional uric acid production which might make his gout worse and therefore it is better to be aggressive.  He will get dexamethasone at the infusion today.  Goal of treatment: Cure Return to clinic in 1 week for toxicity evaluation     No orders of the defined types were placed in this encounter.  The patient has a good understanding of the overall plan. he agrees with it. he will call with any problems that may develop before the  next visit here.  Total time spent: 30 mins including face to face time and time spent for planning, charting and coordination of care  [name], MD 02/17/2020  I, Cloyde Reams Dorshimer, am acting as scribe for Dr. [name].  I have reviewed the above documentation for accuracy and completeness, and I agree with the above.

## 2020-02-17 ENCOUNTER — Other Ambulatory Visit: Payer: 59

## 2020-02-17 ENCOUNTER — Inpatient Hospital Stay (HOSPITAL_BASED_OUTPATIENT_CLINIC_OR_DEPARTMENT_OTHER): Payer: 59 | Admitting: Hematology and Oncology

## 2020-02-17 ENCOUNTER — Inpatient Hospital Stay: Payer: 59 | Admitting: Nutrition

## 2020-02-17 ENCOUNTER — Inpatient Hospital Stay: Payer: 59

## 2020-02-17 ENCOUNTER — Other Ambulatory Visit: Payer: Self-pay

## 2020-02-17 DIAGNOSIS — C099 Malignant neoplasm of tonsil, unspecified: Secondary | ICD-10-CM

## 2020-02-17 DIAGNOSIS — Z5111 Encounter for antineoplastic chemotherapy: Secondary | ICD-10-CM | POA: Diagnosis not present

## 2020-02-17 LAB — BASIC METABOLIC PANEL - CANCER CENTER ONLY
Anion gap: 13 (ref 5–15)
BUN: 12 mg/dL (ref 8–23)
CO2: 21 mmol/L — ABNORMAL LOW (ref 22–32)
Calcium: 10 mg/dL (ref 8.9–10.3)
Chloride: 103 mmol/L (ref 98–111)
Creatinine: 0.93 mg/dL (ref 0.61–1.24)
GFR, Est AFR Am: >60 mL/min (ref >60)
GFR, Estimated: >60 mL/min (ref >60)
Glucose, Bld: 196 mg/dL — ABNORMAL HIGH (ref 70–99)
Potassium: 3.9 mmol/L (ref 3.5–5.1)
Sodium: 137 mmol/L (ref 135–145)

## 2020-02-17 LAB — CBC WITH DIFFERENTIAL (CANCER CENTER ONLY)
Abs Immature Granulocytes: 0.02 10*3/uL (ref 0.00–0.07)
Basophils Absolute: 0 10*3/uL (ref 0.0–0.1)
Basophils Relative: 0 %
Eosinophils Absolute: 0.1 10*3/uL (ref 0.0–0.5)
Eosinophils Relative: 1 %
HCT: 35.9 % — ABNORMAL LOW (ref 39.0–52.0)
Hemoglobin: 11.5 g/dL — ABNORMAL LOW (ref 13.0–17.0)
Immature Granulocytes: 0 %
Lymphocytes Relative: 15 %
Lymphs Abs: 1.3 10*3/uL (ref 0.7–4.0)
MCH: 27.1 pg (ref 26.0–34.0)
MCHC: 32 g/dL (ref 30.0–36.0)
MCV: 84.5 fL (ref 80.0–100.0)
Monocytes Absolute: 0.3 10*3/uL (ref 0.1–1.0)
Monocytes Relative: 4 %
Neutro Abs: 6.9 10*3/uL (ref 1.7–7.7)
Neutrophils Relative %: 80 %
Platelet Count: 213 10*3/uL (ref 150–400)
RBC: 4.25 MIL/uL (ref 4.22–5.81)
RDW: 13.4 % (ref 11.5–15.5)
WBC Count: 8.6 10*3/uL (ref 4.0–10.5)
nRBC: 0 % (ref 0.0–0.2)

## 2020-02-17 LAB — MAGNESIUM: Magnesium: 1.7 mg/dL (ref 1.7–2.4)

## 2020-02-17 MED ORDER — PALONOSETRON HCL INJECTION 0.25 MG/5ML
INTRAVENOUS | Status: AC
  Filled 2020-02-17: qty 5

## 2020-02-17 MED ORDER — SODIUM CHLORIDE 0.9 % IV SOLN
Freq: Once | INTRAVENOUS | Status: AC
  Filled 2020-02-17: qty 10

## 2020-02-17 MED ORDER — METOPROLOL SUCCINATE ER 25 MG PO TB24
25.0000 mg | ORAL_TABLET | Freq: Every day | ORAL | 3 refills | Status: DC
Start: 2020-02-17 — End: 2020-10-27

## 2020-02-17 MED ORDER — SODIUM CHLORIDE 0.9 % IV SOLN
75.0000 mg/m2 | Freq: Once | INTRAVENOUS | Status: AC
  Administered 2020-02-17: 173 mg via INTRAVENOUS
  Filled 2020-02-17: qty 173

## 2020-02-17 MED ORDER — SODIUM CHLORIDE 0.9 % IV SOLN
150.0000 mg | Freq: Once | INTRAVENOUS | Status: AC
  Administered 2020-02-17: 150 mg via INTRAVENOUS
  Filled 2020-02-17: qty 150

## 2020-02-17 MED ORDER — SODIUM CHLORIDE 0.9 % IV SOLN
INTRAVENOUS | Status: DC
  Filled 2020-02-17: qty 250

## 2020-02-17 MED ORDER — SODIUM CHLORIDE 0.9% FLUSH
10.0000 mL | INTRAVENOUS | Status: DC | PRN
  Administered 2020-02-17: 10 mL
  Filled 2020-02-17: qty 10

## 2020-02-17 MED ORDER — HEPARIN SOD (PORK) LOCK FLUSH 100 UNIT/ML IV SOLN
500.0000 [IU] | Freq: Once | INTRAVENOUS | Status: AC | PRN
  Administered 2020-02-17: 500 [IU]
  Filled 2020-02-17: qty 5

## 2020-02-17 MED ORDER — PALONOSETRON HCL INJECTION 0.25 MG/5ML
0.2500 mg | Freq: Once | INTRAVENOUS | Status: AC
  Administered 2020-02-17: 0.25 mg via INTRAVENOUS

## 2020-02-17 MED ORDER — SODIUM CHLORIDE 0.9 % IV SOLN
10.0000 mg | Freq: Once | INTRAVENOUS | Status: AC
  Administered 2020-02-17: 10 mg via INTRAVENOUS
  Filled 2020-02-17: qty 10

## 2020-02-17 MED ORDER — COLCHICINE 0.6 MG PO TABS
0.6000 mg | ORAL_TABLET | Freq: Every day | ORAL | 0 refills | Status: DC
Start: 2020-02-17 — End: 2020-03-05

## 2020-02-17 NOTE — Assessment & Plan Note (Signed)
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  Two right cervical lymph nodes metastases measuring 2.5 cm and 2.1 cm T3N2BM0 stage IVa HPV status: Positive  Treatment plan: Concurrent chemoradiation with cisplatin every 3 weeks x4.  Today is cycle 1 day 1 Chemo education completed Hearing test completed: Hearing impairment issues.  Patient understands the cisplatin could make his hearing much worse. NG tube and port have been placed. Dietitian consulted Weekly IV fluids will be given with electrolyte replacement as needed  Goal of treatment: Cure Return to clinic in 1 week for toxicity evaluation

## 2020-02-17 NOTE — Progress Notes (Signed)
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 Isidore Moos and Dr. Sonny Dandy.  He is receiving concurrent chemoradiation therapy including cisplatin every 21 days x4.  Past medical history includes CABG, pleural effusion, CAD, GERD, gout, hyperlipidemia, hypertension.  Medications include Decadron, Ativan, Zofran, and Compazine.  Labs include glucose 196 and magnesium 1.7 on August 17.  Height: 6 feet 1 inch. Weight: 219.5 pounds on August 17. Usual body weight: 230 pounds. BMI: 28.96.  Estimated nutrition needs: 2400-2700 cal, 115-130 g protein, 2.6 L fluid.  Patient is status post dental extraction of 6 teeth on August 4.   Patient reports he does have difficulty chewing because of the extractions. He reports he is eating soft foods but he is eating normally. He has a good supply of nutrition shakes. He had his G-tube placed on 10 August. Currently denies nausea, vomiting, constipation, diarrhea.  Nutrition diagnosis: Unintended weight loss related to tonsil cancer as evidenced by 5% weight loss from usual body weight.  Intervention: Educated patient to consume small frequent meals and snacks utilizing high-calorie, high-protein foods. Modify diet to soft textures as needed. Encouraged increased water intake. Recommended patient drink 2 bottles of oral nutrition supplements daily between meals. Provided fact sheets on increasing calories and protein. Encourage patient or family to contact me with questions or concerns.  Once tube feeding initiated, recommend 6 cartons [employer] 1.4 to provide 2730 kcal, 120 gm protein and 1404 mL free water.  Monitoring, evaluation, goals: Patient will tolerate increased calories and protein to promote weight stabilization throughout treatment.  Next visit: Wednesday, August 25 during infusion.  **Disclaimer: This note was dictated with voice recognition software. Similar sounding words can inadvertently be transcribed and this note  may contain transcription errors which may not have been corrected upon publication of note.**

## 2020-02-17 NOTE — Patient Instructions (Signed)
[facility] Discharge Instructions for Patients Receiving Chemotherapy  Today you received the following chemotherapy agents: Cisplatin  To help prevent nausea and vomiting after your treatment, we encourage you to take your nausea medication. Don't take Zofran for the first three days. After three days, you may take the Zofran again. If you have nausea before the three days is up, please take your compazine nausea medication as directed on bottle.    If you develop nausea and vomiting that is not controlled by your nausea medication, call the clinic.   BELOW ARE SYMPTOMS THAT SHOULD BE REPORTED IMMEDIATELY:  *FEVER GREATER THAN 100.5 F  *CHILLS WITH OR WITHOUT FEVER  NAUSEA AND VOMITING THAT IS NOT CONTROLLED WITH YOUR NAUSEA MEDICATION  *UNUSUAL SHORTNESS OF BREATH  *UNUSUAL BRUISING OR BLEEDING  TENDERNESS IN MOUTH AND THROAT WITH OR WITHOUT PRESENCE OF ULCERS  *URINARY PROBLEMS  *BOWEL PROBLEMS  UNUSUAL RASH Items with * indicate a potential emergency and should be followed up as soon as possible.  Feel free to call the clinic should you have any questions or concerns. The clinic phone number is [phone].  Please show the [facility] at check-in to the Emergency Department and triage nurse.  Cisplatin injection What is this medicine? CISPLATIN (SIS pla tin) is a chemotherapy drug. It targets fast dividing cells, like cancer cells, and causes these cells to die. This medicine is used to treat many types of cancer like bladder, ovarian, and testicular cancers. This medicine may be used for other purposes; ask your health care provider or pharmacist if you have questions. COMMON BRAND NAME(S): Platinol, Platinol -AQ What should I tell my health care provider before I take this medicine? They need to know if you have any of these conditions:  eye disease, vision problems  hearing problems  kidney disease  low blood counts, like white cells,  platelets, or red blood cells  tingling of the fingers or toes, or other nerve disorder  an unusual or allergic reaction to cisplatin, carboplatin, oxaliplatin, other medicines, foods, dyes, or preservatives  pregnant or trying to get pregnant  breast-feeding How should I use this medicine? This drug is given as an infusion into a vein. It is administered in a hospital or clinic by a specially trained health care professional. Talk to your pediatrician regarding the use of this medicine in children. Special care may be needed. Overdosage: If you think you have taken too much of this medicine contact a poison control center or emergency room at once. NOTE: This medicine is only for you. Do not share this medicine with others. What if I miss a dose? It is important not to miss a dose. Call your doctor or health care professional if you are unable to keep an appointment. What may interact with this medicine? This medicine may interact with the following medications:  foscarnet  certain antibiotics like amikacin, gentamicin, neomycin, polymyxin B, streptomycin, tobramycin, vancomycin This list may not describe all possible interactions. Give your health care provider a list of all the medicines, herbs, non-prescription drugs, or dietary supplements you use. Also tell them if you smoke, drink alcohol, or use illegal drugs. Some items may interact with your medicine. What should I watch for while using this medicine? Your condition will be monitored carefully while you are receiving this medicine. You will need important blood work done while you are taking this medicine. This drug may make you feel generally unwell. This is not uncommon, as  chemotherapy can affect healthy cells as well as cancer cells. Report any side effects. Continue your course of treatment even though you feel ill unless your doctor tells you to stop. This medicine may increase your risk of getting an infection. Call your  healthcare professional for advice if you get a fever, chills, or sore throat, or other symptoms of a cold or flu. Do not treat yourself. Try to avoid being around people who are sick. Avoid taking medicines that contain aspirin, acetaminophen, ibuprofen, naproxen, or ketoprofen unless instructed by your healthcare professional. These medicines may hide a fever. This medicine may increase your risk to bruise or bleed. Call your doctor or health care professional if you notice any unusual bleeding. Be careful brushing and flossing your teeth or using a toothpick because you may get an infection or bleed more easily. If you have any dental work done, tell your dentist you are receiving this medicine. Do not become pregnant while taking this medicine or for 14 months after stopping it. Women should inform their healthcare professional if they wish to become pregnant or think they might be pregnant. Men should not father a child while taking this medicine and for 11 months after stopping it. There is potential for serious side effects to an unborn child. Talk to your healthcare professional for more information. Do not breast-feed an infant while taking this medicine. This medicine has caused ovarian failure in some women. This medicine may make it more difficult to get pregnant. Talk to your healthcare professional if you are concerned about your fertility. This medicine has caused decreased sperm counts in some men. This may make it more difficult to father a child. Talk to your healthcare professional if you are concerned about your fertility. Drink fluids as directed while you are taking this medicine. This will help protect your kidneys. Call your doctor or health care professional if you get diarrhea. Do not treat yourself. What side effects may I notice from receiving this medicine? Side effects that you should report to your doctor or health care professional as soon as possible:  allergic reactions  like skin rash, itching or hives, swelling of the face, lips, or tongue  blurred vision  changes in vision  decreased hearing or ringing of the ears  nausea, vomiting  pain, redness, or irritation at site where injected  pain, tingling, numbness in the hands or feet  signs and symptoms of bleeding such as bloody or black, tarry stools; red or dark brown urine; spitting up blood or brown material that looks like coffee grounds; red spots on the skin; unusual bruising or bleeding from the eyes, gums, or nose  signs and symptoms of infection like fever; chills; cough; sore throat; pain or trouble passing urine  signs and symptoms of kidney injury like trouble passing urine or change in the amount of urine  signs and symptoms of low red blood cells or anemia such as unusually weak or tired; feeling faint or lightheaded; falls; breathing problems Side effects that usually do not require medical attention (report to your doctor or health care professional if they continue or are bothersome):  loss of appetite  mouth sores  muscle cramps This list may not describe all possible side effects. Call your doctor for medical advice about side effects. You may report side effects to FDA at 1-800-FDA-1088. Where should I keep my medicine? This drug is given in a hospital or clinic and will not be stored at home. NOTE: This sheet  is a summary. It may not cover all possible information. If you have questions about this medicine, talk to your doctor, pharmacist, or health care provider.  2020 Elsevier/Gold Standard (2018-06-14 15:59:17)

## 2020-02-18 ENCOUNTER — Other Ambulatory Visit: Payer: Self-pay

## 2020-02-18 ENCOUNTER — Other Ambulatory Visit: Payer: Self-pay | Admitting: Emergency Medicine

## 2020-02-18 ENCOUNTER — Telehealth: Payer: Self-pay | Admitting: Hematology and Oncology

## 2020-02-18 ENCOUNTER — Ambulatory Visit
Admission: RE | Admit: 2020-02-18 | Discharge: 2020-02-18 | Disposition: A | Discharge status: Home / Self Care | Payer: 59 | Source: Ambulatory Visit | Attending: Radiation Oncology | Admitting: Radiation Oncology

## 2020-02-18 ENCOUNTER — Inpatient Hospital Stay: Payer: 59

## 2020-02-18 DIAGNOSIS — C099 Malignant neoplasm of tonsil, unspecified: Secondary | ICD-10-CM

## 2020-02-18 DIAGNOSIS — Z5111 Encounter for antineoplastic chemotherapy: Secondary | ICD-10-CM | POA: Diagnosis not present

## 2020-02-18 NOTE — Progress Notes (Signed)
Oncology Nurse Navigator Documentation  To provide support, encouragement and care continuity, met with Omar Christian and his daughter. for his initial RT.    I reviewed the 2-step treatment process, answered questions.   Omar Christian  completed treatment without difficulty, denied questions/concerns.  I reviewed the registration/arrival procedure for subsequent treatments.  I encouraged them to call me with questions/concerns as tmts proceed.  We discussed his attendance for [facility] tomorrow, and his daughter verbalized that he will be here to attend after his radiation.   Harlow Asa RN, BSN, OCN Head & Neck Oncology Nurse [place] at [facility] Phone # [phone]  Fax # [phone]

## 2020-02-18 NOTE — Telephone Encounter (Signed)
Scheduled per 8/17 los. Called and left a msg.

## 2020-02-19 ENCOUNTER — Ambulatory Visit: Payer: 59

## 2020-02-19 ENCOUNTER — Other Ambulatory Visit: Payer: Self-pay

## 2020-02-19 ENCOUNTER — Encounter: Payer: Self-pay | Admitting: General Practice

## 2020-02-19 ENCOUNTER — Ambulatory Visit: Payer: 59 | Attending: Radiation Oncology | Admitting: Physical Therapy

## 2020-02-19 ENCOUNTER — Ambulatory Visit
Admission: RE | Admit: 2020-02-19 | Discharge: 2020-02-19 | Disposition: A | Discharge status: Home / Self Care | Payer: 59 | Source: Ambulatory Visit | Attending: Radiation Oncology | Admitting: Radiation Oncology

## 2020-02-19 ENCOUNTER — Encounter: Payer: Self-pay | Admitting: Physical Therapy

## 2020-02-19 VITALS — Wt 224.4 lb

## 2020-02-19 DIAGNOSIS — R41841 Cognitive communication deficit: Secondary | ICD-10-CM | POA: Insufficient documentation

## 2020-02-19 DIAGNOSIS — R1313 Dysphagia, pharyngeal phase: Secondary | ICD-10-CM | POA: Insufficient documentation

## 2020-02-19 DIAGNOSIS — R293 Abnormal posture: Secondary | ICD-10-CM | POA: Insufficient documentation

## 2020-02-19 DIAGNOSIS — R471 Dysarthria and anarthria: Secondary | ICD-10-CM | POA: Insufficient documentation

## 2020-02-19 DIAGNOSIS — Z5111 Encounter for antineoplastic chemotherapy: Secondary | ICD-10-CM | POA: Diagnosis not present

## 2020-02-19 DIAGNOSIS — C099 Malignant neoplasm of tonsil, unspecified: Secondary | ICD-10-CM | POA: Diagnosis present

## 2020-02-19 NOTE — Progress Notes (Signed)
Oncology Nurse Navigator Documentation  I met with Omar Christian and his daughter briefly today during [place]. I discussed the importance of his appointments with PT and SLP today to help with side effects after completing treatment and encouraged compliance with education provided today. They had no further questions or concerns at this time. They know to call me if they have any further questions or concerns.   Harlow Asa RN, BSN, OCN Head & Neck Oncology Nurse [place] at [facility] Phone # [phone]  Fax # [phone]

## 2020-02-19 NOTE — Progress Notes (Signed)
[place] CSW Progress Notes  Email from [facility], they have filed patient's application for disability benefits.  Edwyna Shell, LCSW Clinical Social Worker Phone:  [phone]

## 2020-02-19 NOTE — Therapy (Signed)
[place], [place], [address] Phone: [phone]   Fax:  [phone]  Physical Therapy Evaluation  Patient Details  Name: Omar Christian MRN: [id number] Date of Birth: 10/14/1955 Referring Provider (PT): Reita May Date: 02/19/2020   PT End of Session - 02/19/20 0956    Visit Number 1    Number of Visits 1    PT Start Time 0905    PT Stop Time 0926    PT Time Calculation (min) 21 min    Activity Tolerance Patient tolerated treatment well    Behavior During Therapy [facility] for tasks assessed/performed           Past Medical History:  Diagnosis Date  . Abnormal findings on cardiac catheterization 09/30/15   [facility]  . Abnormal myocardial perfusion study 09/24/15   [facility]  . CAD, multiple vessel 09/30/15   per CATH @ [facility]/DR.PARACHOS  . Coronary artery disease   . GERD (gastroesophageal reflux disease)   . Gout   . H/O echocardiogram 09/24/15   [place]  . Hard of hearing   . History of pneumonia   . Hyperlipidemia   . Hypertension   . Pneumonia   . Progressive angina ([place])   . Tonsil cancer ([place])    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    Past Surgical History:  Procedure Laterality Date  . CARDIAC CATHETERIZATION N/A 09/30/2015   Procedure: Left Heart Cath and Coronary Angiography;  Surgeon: [name], MD;  Location: [place] CV LAB;  Service: Cardiovascular;  Laterality: N/A;  . CORONARY ARTERY BYPASS GRAFT N/A 10/08/2015   Procedure: CORONARY ARTERY BYPASS GRAFTING time 4 using left internal mammary and right greater saphenous vein harvested by endovein;  Surgeon: [name], MD;  Location: [place];  Service: Open Heart Surgery;  Laterality: N/A;  . CORONARY ARTERY BYPASS GRAFT    . IR GASTROSTOMY TUBE MOD SED  02/10/2020  . IR IMAGING GUIDED PORT INSERTION  02/10/2020  . MULTIPLE EXTRACTIONS WITH ALVEOLOPLASTY N/A 02/04/2020   Procedure: Extraction  of tooth #'s [id number], and 32 with alveoloplasty and gross debridement of remaining teeth.;  Surgeon: [name], DDS;  Location: [place];  Service: Oral Surgery;  Laterality: N/A;  . TEE WITHOUT CARDIOVERSION N/A 10/08/2015   Procedure: TRANSESOPHAGEAL ECHOCARDIOGRAM (TEE);  Surgeon: [name], MD;  Location: [place];  Service: Open Heart Surgery;  Laterality: N/A;  . VASECTOMY    . WISDOM TOOTH EXTRACTION      There were no vitals filed for this visit.    Subjective Assessment - 02/19/20 0951    Subjective I have not been as active over the last 2 months.    Pertinent History Squamous cell carcinoma with basaloid features, strongly, diffusely + for p16, Stage IVA, 01/11/20- CT maxiofacial revealed findings of R tonsil carcinoma with bilateral tongue invasion and at least 2 ipsilateral malignant nodes, 01/14/20- laryngoscopy and biopsy revealed SCC, 01/26/20- PET revealed 4.9 cm R tonsillar mass extending into bilateral tongue base with associated right cervical nodal metastases; no evidence of distant metastasis, Receiving radiation to tonsil and bilateral neck and chemo, 02/04/20- multiple dental extractions, 02/10/20- PEG/PAC placed    Patient Stated Goals to gain information from providers    Currently in Pain? Yes    Pain Score --   not rated on 0 to 10 scale   Pain Location Mouth    Pain Orientation Right;Left    Pain Descriptors /  Indicators Sore    Pain Type Acute pain    Pain Onset 1 to 4 weeks ago    Pain Frequency Constant    Effect of Pain on Daily Activities no effect- just sore              [facility] PT Assessment - 02/19/20 0001      Assessment   Medical Diagnosis squamous cell carcinomal with basaloid features    Referring Provider (PT) Isidore Moos    Onset Date/Surgical Date 01/14/20    Hand Dominance Right    Prior Therapy none      Precautions   Precautions Other (comment)    Precaution Comments active cancer      Restrictions   Weight Bearing  Restrictions No      Balance Screen   Has the patient fallen in the past 6 months No    Has the patient had a decrease in activity level because of a fear of falling?  No    Is the patient reluctant to leave their home because of a fear of falling?  No      Home Ecologist residence    Living Arrangements Spouse/significant other    Available Help at Discharge Family    Type of [place] to enter      Prior Function   Level of Independence Independent    Vocation Retired    Leisure has not been as active over the last 2 months      Cognition   Overall Cognitive Status Within Functional Limits for tasks assessed      Functional Tests   Functional tests Sit to Stand      Sit to Stand   Comments 30 sec sit to stand: 12 reps which is below average for his age but pt stopped early because he thought the time was up- he would have been closer to average      Posture/Postural Control   Posture/Postural Control Postural limitations    Postural Limitations Rounded Shoulders;Forward head      ROM / Strength   AROM / PROM / Strength AROM      AROM   Overall AROM  Within functional limits for tasks performed    Overall AROM Comments shoulder ROM is [facility]    AROM Assessment Site Cervical    Cervical Flexion WFl    Cervical Extension WFL    Cervical - Right Side Bend WFL    Cervical - Left Side Bend WFL    Cervical - Right Rotation WFL    Cervical - Left Rotation 25% limited      Ambulation/Gait   Ambulation/Gait Yes    Ambulation/Gait Assistance 7: Independent    Ambulation Distance (Feet) 15 Feet    Gait Pattern Within Functional Limits             LYMPHEDEMA/ONCOLOGY QUESTIONNAIRE - 02/19/20 0001      Treatment   Active Chemotherapy Treatment Yes    Active Radiation Treatment Yes      Lymphedema Assessments   Lymphedema Assessments Head and Neck      Head and Neck   4 cm superior to sternal notch around neck  44.3 cm    6 cm superior to sternal notch around neck 43.9 cm    8 cm superior to sternal notch around neck 44.6 cm    Other 46   10 cm superior to sternal notch around neck  Objective measurements completed on examination: See above findings.               PT Education - 02/19/20 0954    Education Details Neck ROM, importance of posture when sitting, standing and lying down, deep breathing, walking program and importance of staying active throughout treatment, CURE article on staying active, "Why exercise?" flyer, lymphedema and PT info    Person(s) Educated Patient;Child(ren)    Methods Explanation;Handout    Comprehension Verbalized understanding                   Head and Neck Clinic Goals - 02/19/20 1000      Patient will be able to verbalize understanding of a home exercise program for cervical range of motion, posture, and walking.    Status Achieved      Patient will be able to verbalize understanding of proper sitting and standing posture.    Status Achieved      Patient will be able to verbalize understanding of lymphedema risk and availability of treatment for this condition.    Status Achieved              Plan - 02/19/20 0956    Clinical Impression Statement Pt presents to PT with recently diagnosed squamous cell carcinoma with basaloid features strong postive for p16. Pt is undergoing radiation to tonsil and bilateral neck and chemotherapy. His shoulder ROM is WFL and his neck ROM is slightly limited in direction of left rotation. Educated pt about signs and symptoms of lymphedema and anatomy and physiology of lymphedema. Educated pt about importance of exercise throughout treatment to help decrease fatigue and issued ROM for head and neck. Pt has no other skilled PT needs at this time.    Stability/Clinical Decision Making Stable/Uncomplicated    Clinical Decision Making Low    Rehab Potential Good      PT Frequency One time visit    PT Treatment/Interventions ADLs/Self Care Home Management;Therapeutic exercise;Patient/family education    PT Next Visit Plan one time visit    PT Home Exercise Plan head and neck ROM exercises    Consulted and Agree with Plan of Care Patient           Patient will benefit from skilled therapeutic intervention in order to improve the following deficits and impairments:  Postural dysfunction, Decreased knowledge of precautions  Visit Diagnosis: Abnormal posture  Malignant neoplasm of tonsil [facility])     Problem List Patient Active Problem List   Diagnosis Date Noted  . Cancer of tonsil ([place]) 02/04/2020  . Poor dentition 02/04/2020  . 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[place]) 01/31/2020  . Tonsillar cancer ([place]) 01/27/2020  . 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 Unstable angina pectoris ([place]) 10/04/2015  . Progressive angina ([place])   . Abnormal findings on cardiac catheterization 09/30/2015  . CAD, multiple vessel 09/30/2015  . Abnormal findings diagnostic imaging of heart and coronary circulation 09/30/2015  . CAD in native artery 09/30/2015  . H/O echocardiogram 09/24/2015  . Abnormal myocardial perfusion study 09/24/2015  . Chest pain 09/03/2015  . Allergic rhinitis 08/27/2015  . H/O: gout 08/27/2015  . HLD (hyperlipidemia) 08/27/2015  . Benign hypertension 08/27/2015  . Acid reflux 08/27/2015  . Essential (primary) hypertension 08/27/2015  . H/O disease 08/27/2015    Allyson Sabal [facility] 02/19/2020, 10:05 AM  [place], [place], [address] Phone: [phone]   Fax:  [phone]  Name: Omar Christian MRN: [id number] Date of Birth: [birth year]  Manus Gunning, PT 02/19/20 10:05 AM

## 2020-02-19 NOTE — Therapy (Signed)
[facility] [address] [place], [place], [address] Phone: [phone]   Fax:  [phone]  Speech Language Pathology Evaluation  Patient Details  Name: Omar Christian MRN: [id number] Date of Birth: 1955/10/10 Referring Provider (SLP): [name], MD   Encounter Date: 02/19/2020   End of Session - 02/19/20 1238    Visit Number 1    Number of Visits 7    Date for SLP Re-Evaluation 05/19/20    SLP Start Time 0942    SLP Stop Time  1024    SLP Time Calculation (min) 42 min    Activity Tolerance Other (comment)   mildly limited by cognition          Past Medical History:  Diagnosis Date  . Abnormal findings on cardiac catheterization 09/30/15   [facility]  . Abnormal myocardial perfusion study 09/24/15   [facility]  . CAD, multiple vessel 09/30/15   per CATH @ [facility]/DR.PARACHOS  . Coronary artery disease   . GERD (gastroesophageal reflux disease)   . Gout   . H/O echocardiogram 09/24/15   [facility]  . Hard of hearing   . History of pneumonia   . Hyperlipidemia   . Hypertension   . Pneumonia   . Progressive angina ([place])   . 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 CARDIAC CATHETERIZATION N/A 09/30/2015   Procedure: Left Heart Cath and Coronary Angiography;  Surgeon: [name], MD;  Location: [facility];  Service: Cardiovascular;  Laterality: N/A;  . CORONARY ARTERY BYPASS GRAFT N/A 10/08/2015   Procedure: CORONARY ARTERY BYPASS GRAFTING time 4 using left internal mammary and right greater saphenous vein harvested by endovein;  Surgeon: [name], MD;  Location: [facility];  Service: Open Heart Surgery;  Laterality: N/A;  . CORONARY ARTERY BYPASS GRAFT    . IR GASTROSTOMY TUBE MOD SED  02/10/2020  . IR IMAGING GUIDED PORT INSERTION  02/10/2020  . MULTIPLE EXTRACTIONS WITH ALVEOLOPLASTY N/A 02/04/2020    Procedure: Extraction of tooth #'s [phone], and 32 with alveoloplasty and gross debridement of remaining teeth.;  Surgeon: [name], DDS;  Location: [facility];  Service: Oral Surgery;  Laterality: N/A;  . TEE WITHOUT CARDIOVERSION N/A 10/08/2015   Procedure: TRANSESOPHAGEAL ECHOCARDIOGRAM (TEE);  Surgeon: [name], MD;  Location: [facility];  Service: Open Heart Surgery;  Laterality: N/A;  . VASECTOMY    . WISDOM TOOTH EXTRACTION      Vitals:   02/19/20 0900  Weight: 224 lb 6.4 oz (101.8 kg)     Subjective Assessment - 02/19/20 1201    Subjective Pt is hard of hearing, per daughter, Crystal. SLP raised dB of SLP's voice to accomodate and frequently asked pt if SLP was at an understandable volume. Rarely, pt asked SLP for repeat.    Patient is accompained by: Family member   Crystal, daughter   Currently in Pain? Yes    Pain Location Mouth    Pain Orientation Right;Left    Pain Descriptors / Indicators Sore    Pain Type Acute pain    Pain Onset 1 to 4 weeks ago    Pain Frequency Constant              SLP Evaluation [facility] - 02/19/20 1201      SLP Visit Information   SLP Received On 02/19/20    Referring Provider (  SLP) [name], MD    Onset Date June 2021    Medical Diagnosis Malignant neoplasm of tonsil      General Information   HPI Presented initially with rt sided neck swelling and ear pain, sore throat, coughin/spitting up blood, voice changes. 01-11-20 CT rt tonsil carcinoma with bil tongue invasion and 2 ipsilateral node malignancy. 01-14-20 [facility] - laryngoscopy ID'd [id number]. 01-26-20 PET revealed 4.9 cm rt tonsilar mass extending into bil tongue base with rt cervical node mets withoug evidence of distant mets. Pt had 6 extractions 02-04-20 and PEG placed 02-10-20. Plan is 35 fx with concurrent weekly chemo.       Cognition   Overall Cognitive Status Impaired/Different from baseline    Area of Impairment Following commands;Memory    Memory Comments pt relied on  daughter for medical/symptom history - told SLP incorrect information x2 without awareness - dtr corrected pt.    Following Commands Follows multi-step commands inconsistently    Following Command Comments With demo and audible verbal cues, difficulty was evident with multi-step directions      Auditory Comprehension   Overall Auditory Comprehension Appears within functional limits for tasks assessed      Oral Motor/Sensory Function   Overall Oral Motor/Sensory Function Impaired    Lingual ROM Reduced right;Reduced left   upper lingual sulcus hard for pt to reach; lower WNL   Lingual Strength Reduced    Lingual Coordination Reduced   appeared like oral nonverbal apraxia- L<->R labial margin   Mandible --   pt appeared with trismus (?)   Overall Oral Motor/Sensory Function Upon observation of pt lingual surface SLP ?s presence of lingual fasciculations. SLP notified pt's referring MD      Motor Speech   Overall Motor Speech Impaired    Articulation Impaired    Level of Impairment Word    Intelligibility Intelligible          Pt demonstrated immediate mild coughing with 3/3 presentations of liquid. Two boluses of [place] sandwich with very small sips water did not result in any coughing or throat clearing. Because of this SLP told pt to take small sips whenever he drinks. Pt did this x3 after this instruction, and had one mild throat clear and reswallow with 1/3 of those sips. Modifed barium swallow may want to be considered at this time. See "plan" for more details re: MBSS.   Pt noted to have difficulty with sequencing and performing effortful swallow, Mendelsohn swallow, and super-supraglottic even with multiple attempts and with audible verbal cues (note pt is hard of hearing), and demo cues from SLP and from daughter. See "cognition" above. Daughter stated she was comfortable with assisting pt at home with HEP.   Because data states the risk for dysphagia during and after radiation  treatment is high due to undergoing radiation tx, SLP taught pt and daughter about the possibility of reduced/limited ability for PO intake during rad tx. SLP encouraged pt and daughter to continue pt with swallowing POs as far into rad tx as possible, even ingesting POs and/or completing HEP shortly after administration of pain meds. Among other modifications for days when pt cannot functionally swallow, SLP talked to pt and daughter about performing only non-swallowing tasks on the handout/HEP, and then adding swallowing tasks back in when it becomes possible to do so.  SLP educated pt and daughter re: changes to swallowing musculature after rad tx, and why adherence to dysphagia HEP provided today and PO consumption was necessary to  inhibit muscular disuse atrophy and to reduce muscle fibrosis following rad tx. Pt (and daughter) demonstrated understanding of these things to SLP.    SLP then developed a HEP for pt and pt and daughter were instructed how to perform exercises involving lingual, vocal, and pharyngeal strengthening. SLP performed each exercise and pt return demonstrated each exercise to best of his ability. SLP ensured pt performance was as best as it could before moving on to next exercise. (Daughter stated she would assist pt as necessary at home with HEP procedure for effortful swallow, MEndelsohn, and super-supraglottic. Pt was instructed to complete this program 2-3 times a day, 6-7 days/week until 6 months after his last rad tx, then x2 a week after that.                  SLP Education - 02/19/20 1237    Education Details HEP procedure, late effect head/neck radiaiton on swallow ability    Person(s) Educated Patient;Child(ren)    Methods Explanation;Demonstration;Verbal cues;Handout    Comprehension Verbalized understanding;Returned demonstration;Verbal cues required;Need further instruction            SLP Short Term Goals - 02/19/20 1257      SLP SHORT TERM GOAL  #1   Title pt will complete HEP with usual min A    Time 2    Period --   visits, for all STGs   Status New      SLP SHORT TERM GOAL #2   Title pt will tell SLP why pt is completing HEP with min A, within 5 minutes of being told initially - x2 sessions    Time 2    Status New      SLP SHORT TERM GOAL #3   Title pt/family will describe 3 overt s/s aspiration PNA with modified independence    Time 2    Status New      SLP SHORT TERM GOAL #4   Title pt/family will tell SLP how a food journal could hasten return to a more normalized diet    Time 3    Status New            SLP Long Term Goals - 02/19/20 1300      SLP LONG TERM GOAL #1   Title pt will complete HEP with occasional min A from family x2 sessions    Time 4    Period --   sessions, for all LTGs   Status New      SLP LONG TERM GOAL #2   Title pt/family will describe how to modify HEP over time, and the timeline associated with reduction in HEP frequency with modified independence over two sessions    Time 6    Status New      SLP LONG TERM GOAL #3   Title pt will demonstrate adherance to any swallowing safety precautions from an objective swallow assessment with occasional min A from family/SLP    Time 3    Status New            Plan - 02/19/20 1241    Clinical Impression Statement Daughter and pt report overt s/s aspiration (coughing) for approx 4-6 weeks with liquids and solids. Modified barium swallow (MBSS) is suggested at this time, however referring MD may want to wait with this, given pt's relative tolerance of any possible aspiration thus far, pt recently begun undergoing radiation and chemo tx, and pt's decr'd cognitive status. Pt's dysphagia may subside as pt's chemorad  tx begins to shrink tumor size. SLP designed an individualized HEP for dysphagia and pt completed each exercise on their own with ** cues.  Data indicate that pt's swallow ability will likely decrease over the course of radiation therapy  and could very well decline over time following conclusion of their radiation therapy due to muscle disuse atrophy and/or muscle fibrosis. Pt will cont to need to be seen by SLP in order to assess safety of PO intake, assess the need for recommending any objective swallow assessment, and ensuring pt correctly completes the individualized HEP.    Speech Therapy Frequency --   once, approx every 4 weeks   Duration --   7 total sessions   Treatment/Interventions Aspiration precaution training;Pharyngeal strengthening exercises;Diet toleration management by SLP;Trials of upgraded texture/liquids;Compensatory techniques;Patient/family education;SLP instruction and feedback;Multimodal communcation approach;Cognitive reorganization    Potential to Achieve Goals Fair    Potential Considerations Ability to learn/carryover information;Previous level of function    SLP Home Exercise Plan provided today    Consulted and Agree with Plan of Care Patient           Patient will benefit from skilled therapeutic intervention in order to improve the following deficits and impairments:   Dysphagia, pharyngeal phase  Cognitive communication deficit  Dysarthria and anarthria    Problem List Patient Active Problem List   Diagnosis Date Noted  . Cancer of tonsil ([place]) 02/04/2020  . Poor dentition 02/04/2020  . 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[place]) 01/31/2020  . Tonsillar cancer ([place]) 01/27/2020  . 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 Unstable angina pectoris ([place]) 10/04/2015  . Progressive angina ([place])   . Abnormal findings on cardiac catheterization 09/30/2015  . CAD, multiple vessel 09/30/2015  . Abnormal findings diagnostic imaging of heart and coronary circulation 09/30/2015  . CAD in native artery 09/30/2015  . H/O echocardiogram 09/24/2015  . Abnormal myocardial perfusion study 09/24/2015  . Chest pain 09/03/2015  . Allergic rhinitis 08/27/2015  . H/O: gout 08/27/2015  . HLD (hyperlipidemia)  08/27/2015  . Benign hypertension 08/27/2015  . Acid reflux 08/27/2015  . Essential (primary) hypertension 08/27/2015  . H/O disease 08/27/2015    [facility] ,[place], [place]  02/19/2020, 1:13 PM  [facility] [address] [place], [place], [address] Phone: [phone]   Fax:  [phone]  Name: Omar Christian MRN: [id number] Date of Birth: [birth year]

## 2020-02-19 NOTE — Progress Notes (Signed)
[facility] Initial Psychosocial Assessment Clinical Social Work  Clinical Social Work saw patient and daughter in [place] to assess psychosocial, emotional, mental health, and spiritual needs of the patient.   Barriers to care/review of distress screen:  - Transportation:  Do you anticipate any problems getting to appointments?  Do you have someone who can help run errands for you if you need it?  Family members are helping w transportation, no concerns.  - Help at home:  What is your living situation (alone, family, other)?  If you are physically unable to care for yourself, who would you call on to help you?  Lives w wife who is physically able to provided care needed.  Daughters are very supportive - Support system:  What does your support system look like?  Who would you call on if you needed some kind of practical help?  What if you needed someone to talk to for emotional support?  Very supportive family - Finances:  Are you concerned about finances.  Considering returning to work?  If not, applying for disability?  Is not working, has not worked since beginning of illness.  Per daughter, he does not handle finances - this is all done by his wife.  CSW has spoken w wife in the past, she has expressed financial concerns due to income loss.  CSW will follow up w wife.  "We both worry about finances, but we are OK."  [facility] has received their referral, and they will reach out to family.     CSW Summary:  Patient and family psychosocial functioning including strengths, limitations, and coping skills:  Patient is 64 year old married male, diagnosed with tonsillar cancer, undergoing concurrent chemotherapy and radiation treatment.  Thus far he reports he is tolerating treatment well.  Has significant support from family members who reinforce treatment team instructions.  Stressed the need to have both distress tolerance skills as treatment can be difficult and good communication w treatment  team so side effects can be managed as well as possible.  Stressed focusing on "things he can control" such as nutrition, speech/swallowing exercises and PT recommendations to reduce swelling.  He reports his main issue at present is tongue discomfort, he will mention this to the team.  Spoke w wife, she confirms that he is "doing pretty good", is eating well, has gained some weight today.  Family is stressing the need to consume adequate protein and calories. They are meeting their bills, but finances are tight.  Has not started to use the tube feedings yet, but is aware he may need to start in a few weeks.    Identifications of barriers to care:  Lack of regular income as he is unable to work.  CSW has spoken w wife in the past, will reach out again.  Per daughter, he is to focus on "getting well", not of finances which are handled by his spouse.    Availability of community resources:  [employer], other community resources to help w finances as needed  Clinical Social Worker follow up needed: Yes.    Will connect w wife and patient in two weeks to assess needs/progress.  Edwyna Shell, LCSW Clinical Social Worker Phone:  [phone] Cell:  [phone]

## 2020-02-19 NOTE — Patient Instructions (Signed)
SWALLOWING EXERCISES Do these until 6 months after your last day of radiation, then 2-3 times per week afterwards  1. Effortful Swallows - Press your tongue against the roof of your mouth for 3 seconds, then squeeze the muscles in your neck while you swallow your saliva or a sip of water - Repeat 10-15 times, 2-3 times a day, and use whenever you eat or drink  2. [place]ako Swallow - swallow with your tongue sticking out - Stick tongue out past your teeth and gently bite tongue with your teeth - Swallow, while holding your tongue with your teeth - Repeat 10-15 times, 2-3 times a day *use a wet spoon if your mouth gets dry*  3. Pitch Raise - Repeat "he", once per second in as high of a pitch as you can - Repeat 20 times, 2-3 times a day  4. Mendelsohn Maneuver - "half swallow" exercise - Start to swallow, and keep your Adam's apple up by squeezing hard with the muscles of the throat - Hold the squeeze for 5-7 seconds and then relax - Repeat 10-15 times, 2-3 times a day *use a wet spoon if your mouth gets dry*  5. Chin pushback - Open your mouth  - Place your fist UNDER your chin near your neck - Tuck your chin and push back with your fist for 5 seconds - Repeat 10 times, 2-3 times a day       6.   "Super Swallow"  - Take a breath and hold it  - Bear down (like pushing your bowels)  - Swallow then IMMEDIATELY cough  - Repeat 10 times, 2-3 times a day   

## 2020-02-20 ENCOUNTER — Other Ambulatory Visit: Payer: Self-pay

## 2020-02-20 ENCOUNTER — Ambulatory Visit
Admission: RE | Admit: 2020-02-20 | Discharge: 2020-02-20 | Disposition: A | Discharge status: Home / Self Care | Payer: 59 | Source: Ambulatory Visit | Attending: Radiation Oncology | Admitting: Radiation Oncology

## 2020-02-20 DIAGNOSIS — Z5111 Encounter for antineoplastic chemotherapy: Secondary | ICD-10-CM | POA: Diagnosis not present

## 2020-02-20 DIAGNOSIS — C099 Malignant neoplasm of tonsil, unspecified: Secondary | ICD-10-CM

## 2020-02-23 ENCOUNTER — Other Ambulatory Visit: Payer: Self-pay

## 2020-02-23 ENCOUNTER — Ambulatory Visit
Admission: RE | Admit: 2020-02-23 | Discharge: 2020-02-23 | Disposition: A | Discharge status: Home / Self Care | Payer: 59 | Source: Ambulatory Visit | Attending: Radiation Oncology | Admitting: Radiation Oncology

## 2020-02-23 DIAGNOSIS — C099 Malignant neoplasm of tonsil, unspecified: Secondary | ICD-10-CM

## 2020-02-23 DIAGNOSIS — Z5111 Encounter for antineoplastic chemotherapy: Secondary | ICD-10-CM | POA: Diagnosis not present

## 2020-02-23 MED ORDER — SONAFINE EX EMUL
1.0000 "application " | Freq: Two times a day (BID) | CUTANEOUS | Status: DC
  Administered 2020-02-23: 1 via TOPICAL

## 2020-02-23 NOTE — Progress Notes (Signed)

## 2020-02-24 ENCOUNTER — Other Ambulatory Visit: Payer: Self-pay

## 2020-02-24 ENCOUNTER — Ambulatory Visit
Admission: RE | Admit: 2020-02-24 | Discharge: 2020-02-24 | Disposition: A | Discharge status: Home / Self Care | Payer: 59 | Source: Ambulatory Visit | Attending: Radiation Oncology | Admitting: Radiation Oncology

## 2020-02-24 ENCOUNTER — Other Ambulatory Visit: Payer: Self-pay | Admitting: *Deleted

## 2020-02-24 DIAGNOSIS — Z5111 Encounter for antineoplastic chemotherapy: Secondary | ICD-10-CM | POA: Diagnosis not present

## 2020-02-24 NOTE — Progress Notes (Signed)
Patient Care Team: Chrismon, Vickki Muff, PA as PCP - General (Family Medicine) [name], MD as Consulting Physician (Cardiothoracic Surgery) [name], MD as Consulting Physician (Cardiology) Serena Colonel, RN as [place] Management [name], MD as Consulting Physician (Hematology and Oncology) [name], MD as Attending Physician (Radiation Oncology) [name], [name], RN as Oncology Nurse Navigator [name], [name], [place] as Speech Language Pathologist (Speech Pathology) [name], DDS as Consulting Physician (Dentistry) [name], [name], PT as Physical Therapist (Physical Therapy) [name], LCSW as Social Worker (General Practice) [name], LCSW as Social Worker  DIAGNOSIS:    ICD-10-CM   1. Tonsillar cancer ([place])  C09.9     SUMMARY OF ONCOLOGIC HISTORY: Oncology History  Tonsillar cancer ([place])  01/14/2020 Initial Diagnosis   Squamous cell carcinoma with basaloid features   01/26/2020 PET scan   Right tonsillar mass extending to bilateral tongue base 4.9 cm.  Two right cervical lymph nodes metastases measuring 2.5 cm and 2.1 cm   02/17/2020 -  Chemotherapy   The patient had dexamethasone (DECADRON) 4 MG tablet, 8 mg, Oral, Daily, 1 of 1 cycle, Start date: 02/02/2020, End date: -- palonosetron (ALOXI) injection 0.25 mg, 0.25 mg, Intravenous,  Once, 1 of 3 cycles Administration: 0.25 mg (02/17/2020) CISplatin (PLATINOL) 173 mg in sodium chloride 0.9 % 500 mL chemo infusion, 75 mg/m2 = 173 mg (100 % of original dose 75 mg/m2), Intravenous,  Once, 1 of 3 cycles Dose modification: 75 mg/m2 (original dose 75 mg/m2, Cycle 1, Reason: Provider Judgment) Administration: 173 mg (02/17/2020) fosaprepitant (EMEND) 150 mg in sodium chloride 0.9 % 145 mL IVPB, 150 mg, Intravenous,  Once, 1 of 3 cycles Administration: 150 mg (02/17/2020)  for chemotherapy treatment.    02/17/2020 Cancer Staging    Staging form: Pharynx - P16 Negative Oropharynx, AJCC 8th Edition - Clinical stage from 02/17/2020: Stage IVA (cT3, cN2, cM0, p16+) - Signed by [name], MD on 02/17/2020   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[place])  01/27/2020 Cancer Staging   Staging form: Pharynx - HPV-Mediated Oropharynx, AJCC 8th Edition - Clinical stage from 01/27/2020: Stage III (cT4, cN1, cM0, p16+) - Signed by [name], MD on 01/31/2020   01/31/2020 Initial Diagnosis   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[place])     CHIEF COMPLIANT: Cycle 1 Day 9 Cisplatin  INTERVAL HISTORY: Omar Christian is a 64 y.o. with above-mentioned history of tonsil cancer currently on treatment with radiation and chemotherapy with cisplatin. He presents to the clinic today following cycle 1 for a toxicity check. After cycle one of cisplatin he had nausea and he threw up once. He also had a Few episodes of diarrhea. Diarrhea subsided a few days ago. He has not been eating or drinking very well. His weight has come down by 12 lbs. Today on blood work he is found to have a dehydration and renal insufficiency. He is meeting with her dietitian today.  ALLERGIES:  has No Known Allergies.  MEDICATIONS:  Current Outpatient Medications  Medication Sig Dispense Refill  . acetaminophen (TYLENOL) 650 MG CR tablet Take 650 mg by mouth daily.     Marland Kitchen allopurinol (ZYLOPRIM) 100 MG tablet Take 1 tablet (100 mg total) by mouth daily. 30 tablet 12  . atorvastatin (LIPITOR) 20 MG tablet Take 1 tablet (20 mg total) by mouth daily. 90 tablet 3  . colchicine 0.6 MG tablet Take 1 tablet (0.6 mg total) by mouth  daily. 30 tablet 0  . dexamethasone (DECADRON) 4 MG tablet Take 2 tablets (8 mg total) by mouth daily. Take daily for 3 days after chemo. Take with food. 20 tablet 0  . lidocaine-prilocaine (EMLA) cream Apply to affected area once 30 g 3  . LORazepam (ATIVAN) 0.5 MG tablet Take 1 tablet (0.5 mg total) by mouth at bedtime as needed (Nausea or vomiting).  30 tablet 0  . metoprolol succinate (TOPROL XL) 25 MG 24 hr tablet Take 1 tablet (25 mg total) by mouth daily. 30 tablet 3  . ondansetron (ZOFRAN) 8 MG tablet Take 1 tablet (8 mg total) by mouth 2 (two) times daily as needed. Start on the third day after chemotherapy. 30 tablet 1  . oxyCODONE-acetaminophen (PERCOCET/ROXICET) 5-325 MG tablet Take 1 tablet by mouth every 4 (four) hours as needed.     . prochlorperazine (COMPAZINE) 10 MG tablet Take 1 tablet (10 mg total) by mouth every 6 (six) hours as needed (Nausea or vomiting). 30 tablet 1  . sodium fluoride (PREVIDENT 5000 PLUS) 1.1 % CREA dental cream Apply to tooth brush. Brush teeth for 2 minutes. Spit out excess-DO NOT swallow. DO NOT rinse afterwards. Repeat nightly. 51 g prn   No current facility-administered medications for this visit.    PHYSICAL EXAMINATION: ECOG PERFORMANCE STATUS: 1 - Symptomatic but completely ambulatory  Vitals:   02/25/20 0840  BP: 123/66  Pulse: (!) 50  Resp: 18  Temp: 98.1 F (36.7 C)  SpO2: 99%   Filed Weights   02/25/20 0840  Weight: 211 lb 6.4 oz (95.9 kg)    LABORATORY DATA:  I have reviewed the data as listed CMP Latest Ref Rng & Units 02/25/2020 02/17/2020 02/05/2020  Glucose 70 - 99 mg/dL 172(H) 196(H) 219(H)  BUN 8 - 23 mg/dL 36(H) 12 10  Creatinine 0.61 - 1.24 mg/dL 1.82(H) 0.93 0.98  Sodium 135 - 145 mmol/L 133(L) 137 138  Potassium 3.5 - 5.1 mmol/L 3.4(L) 3.9 3.9  Chloride 98 - 111 mmol/L 95(L) 103 100  CO2 22 - 32 mmol/L 27 21(L) 25  Calcium 8.9 - 10.3 mg/dL 10.0 10.0 9.2  Total Protein 6.0 - 8.5 g/dL - - -  Total Bilirubin 0.0 - 1.2 mg/dL - - -  Alkaline Phos 39 - 117 IU/L - - -  AST 0 - 40 IU/L - - -  ALT 0 - 44 IU/L - - -    Lab Results  Component Value Date   WBC 9.4 02/25/2020   HGB 12.8 (L) 02/25/2020   HCT 37.9 (L) 02/25/2020   MCV 80.8 02/25/2020   PLT 191 02/25/2020   NEUTROABS 7.8 (H) 02/25/2020    ASSESSMENT & PLAN:  Tonsillar cancer ([place]) 01/14/2020: Tonsil  biopsy: Squamous cell carcinoma with basaloid features 01/26/2020: PET/CT: Right tonsillar mass extending to bilateral tongue base 4.9 cm. Two right cervical lymph nodesmetastases measuring 2.5 cm and 2.1 cm [id number] stage IVa HPV status:Positive  Treatment plan: Concurrent chemoradiation with cisplatin every 3 weeks x4.  Today is cycle 1 day 8 Patient gets IV fluids today HTN H/O gout: treated with colchicine. Chemo toxicities: 1.  Weight loss: Patient's oral intake has come down significantly.  He is meeting with Pamala Hurry with the dietary today to see if he can start on PEG tube feeds. 2. Fatigue 3.  Worsening hearing impairment patient understands the risks of continuing cisplatin and wants to push forward. 4.  Nausea x1  RTC in 2 weeks to see me with cycle  2.    No orders of the defined types were placed in this encounter.  The patient has a good understanding of the overall plan. he agrees with it. he will call with any problems that may develop before the next visit here.  Total time spent: 30 mins including face to face time and time spent for planning, charting and coordination of care  [name], MD 02/25/2020  I, Cloyde Reams Dorshimer, am acting as scribe for Dr. [name].  I have reviewed the above documentation for accuracy and completeness, and I agree with the above.

## 2020-02-24 NOTE — Patient Outreach (Signed)
[facility]) Care Management  02/24/2020  Omar Christian 07-29-1955 [id number]   Subjective:  Per patient's previous consent, patient gave this RNCM verbal consent to speak with his wife Omar Christian), and daughter Omar Christian) regarding his healthcare needs as needed.   Telephone call to patient's home  / mobile number, no answer, left HIPAA compliant voicemail message for patient's wife Omar Christian), and requested call back.   Objective: Per KPN (Knowledge Performance Now, point of care tool) and chart review, patient has not had a recent hospitalization.  Patient had ED visit on 01/11/2020 for dental pain, right facial/ neck pain/ swelling, and oral cancer.  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     Assessment:  Received [employer] plan referral on 01/21/2020.  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  Screening follow up completed and will follow up for new cancer diagnosis/ other diagnosis care coordination.     Plan: RNCM will call patient's wife and / or patient's daughter Omar Christian)  for  2nd telephone outreach attempt within 7 business days, care coordination follow up, and proceed with case closure, after 4th unsuccessful outreach call.       Kinzlee Selvy H. Annia Friendly, BSN, [place] Management [facility] Telephonic CM Phone: [phone] Fax: [phone]

## 2020-02-25 ENCOUNTER — Other Ambulatory Visit: Payer: Self-pay | Admitting: *Deleted

## 2020-02-25 ENCOUNTER — Other Ambulatory Visit: Payer: Self-pay

## 2020-02-25 ENCOUNTER — Inpatient Hospital Stay (HOSPITAL_BASED_OUTPATIENT_CLINIC_OR_DEPARTMENT_OTHER): Payer: 59 | Admitting: Hematology and Oncology

## 2020-02-25 ENCOUNTER — Inpatient Hospital Stay: Payer: 59 | Admitting: Nutrition

## 2020-02-25 ENCOUNTER — Ambulatory Visit
Admission: RE | Admit: 2020-02-25 | Discharge: 2020-02-25 | Disposition: A | Discharge status: Home / Self Care | Payer: 59 | Source: Ambulatory Visit | Attending: Radiation Oncology | Admitting: Radiation Oncology

## 2020-02-25 ENCOUNTER — Inpatient Hospital Stay: Payer: 59

## 2020-02-25 DIAGNOSIS — C099 Malignant neoplasm of tonsil, unspecified: Secondary | ICD-10-CM

## 2020-02-25 DIAGNOSIS — Z5111 Encounter for antineoplastic chemotherapy: Secondary | ICD-10-CM | POA: Diagnosis not present

## 2020-02-25 LAB — MAGNESIUM: Magnesium: 1.6 mg/dL — ABNORMAL LOW (ref 1.7–2.4)

## 2020-02-25 LAB — BASIC METABOLIC PANEL - CANCER CENTER ONLY
Anion gap: 11 (ref 5–15)
BUN: 36 mg/dL — ABNORMAL HIGH (ref 8–23)
CO2: 27 mmol/L (ref 22–32)
Calcium: 10 mg/dL (ref 8.9–10.3)
Chloride: 95 mmol/L — ABNORMAL LOW (ref 98–111)
Creatinine: 1.82 mg/dL — ABNORMAL HIGH (ref 0.61–1.24)
GFR, Est AFR Am: 45 mL/min — ABNORMAL LOW (ref >60)
GFR, Estimated: 39 mL/min — ABNORMAL LOW (ref >60)
Glucose, Bld: 172 mg/dL — ABNORMAL HIGH (ref 70–99)
Potassium: 3.4 mmol/L — ABNORMAL LOW (ref 3.5–5.1)
Sodium: 133 mmol/L — ABNORMAL LOW (ref 135–145)

## 2020-02-25 LAB — CBC WITH DIFFERENTIAL (CANCER CENTER ONLY)
Abs Immature Granulocytes: 0.06 10*3/uL (ref 0.00–0.07)
Basophils Absolute: 0 10*3/uL (ref 0.0–0.1)
Basophils Relative: 0 %
Eosinophils Absolute: 0.1 10*3/uL (ref 0.0–0.5)
Eosinophils Relative: 1 %
HCT: 37.9 % — ABNORMAL LOW (ref 39.0–52.0)
Hemoglobin: 12.8 g/dL — ABNORMAL LOW (ref 13.0–17.0)
Immature Granulocytes: 1 %
Lymphocytes Relative: 10 %
Lymphs Abs: 0.9 10*3/uL (ref 0.7–4.0)
MCH: 27.3 pg (ref 26.0–34.0)
MCHC: 33.8 g/dL (ref 30.0–36.0)
MCV: 80.8 fL (ref 80.0–100.0)
Monocytes Absolute: 0.5 10*3/uL (ref 0.1–1.0)
Monocytes Relative: 6 %
Neutro Abs: 7.8 10*3/uL — ABNORMAL HIGH (ref 1.7–7.7)
Neutrophils Relative %: 82 %
Platelet Count: 191 10*3/uL (ref 150–400)
RBC: 4.69 MIL/uL (ref 4.22–5.81)
RDW: 13.2 % (ref 11.5–15.5)
WBC Count: 9.4 10*3/uL (ref 4.0–10.5)
nRBC: 0 % (ref 0.0–0.2)

## 2020-02-25 MED ORDER — HEPARIN SOD (PORK) LOCK FLUSH 100 UNIT/ML IV SOLN
500.0000 [IU] | INTRAVENOUS | Status: AC | PRN
  Administered 2020-02-25: 500 [IU]
  Filled 2020-02-25: qty 5

## 2020-02-25 MED ORDER — SODIUM CHLORIDE 0.9 % IV SOLN
Freq: Once | INTRAVENOUS | Status: AC
  Filled 2020-02-25: qty 250

## 2020-02-25 MED ORDER — KATE FARMS STANDARD 1.4 EN LIQD: 488.0000 mL | Freq: Four times a day (QID) | ENTERAL | 6 refills | Status: DC

## 2020-02-25 MED ORDER — SODIUM CHLORIDE 0.9% FLUSH
10.0000 mL | INTRAVENOUS | Status: AC | PRN
  Administered 2020-02-25: 10 mL
  Filled 2020-02-25: qty 10

## 2020-02-25 NOTE — Assessment & Plan Note (Signed)
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 Two right cervical lymph nodesmetastases measuring 2.5 cm and 2.1 cm T3N2BM0 stage IVa HPV status:Positive  Treatment plan: Concurrent chemoradiation with cisplatin every 3 weeks x4.  Today is cycle 1 day 8 Patient gets IV fluids today HTN H/O gout: treated with colchicine. Chemo toxicities:  RTC in 2 weeks to see me with cycle 2.

## 2020-02-25 NOTE — Patient Instructions (Signed)

## 2020-02-25 NOTE — Progress Notes (Signed)
Nutrition follow-up completed with patient and daughters Omar Christian and Omar Christian. Patient has low literacy so education is completed with both patient and family. Patient receiving concurrent chemoradiation therapy for tonsil cancer. Patient has not been eating much since Saturday.  He likes to drink Gatorade. He reports vomiting one time after drinking Ensure too quickly. Weight decreased and documented as 211.4 pounds on August 25 down from 219.5 pounds August 17. 8% weight loss from usual body weight of 230 pounds. Labs reviewed: Sodium 133, potassium 3.4, glucose 172, BUN 36, creatinine 1.82, magnesium 1.6. Patient has had poor appetite and has been refusing to eat much despite encouragement.  Estimated nutrition needs: 2400-2700 cal, 115-130 g protein, 2.6 L fluid.  Nutrition diagnosis: Unintended weight loss continues.  Intervention: Begin [employer] 1.4 via G-tube, 1 carton 4 times daily, 4 hours apart with 60 mL free water before and after each bolus feeding. Continue oral intake as tolerated. Encourage minimum of 240 mL of free water 4 times a day. If patient continues to refuse food, will increase [employer] 1.4 to goal rate of 1-1/2 cartons 4 times daily to provide 2730 cal, 120 g protein and 1404 mL free water. Written instructions provided along with contact information. Both daughters were educated in administering tube feeding. Orders were written and [employer] health notified. Questions were answered.  Teach back method used.  Monitoring, evaluation, goals: Patient will tolerate adequate calories and protein to minimize weight loss.  Next visit: Tuesday, August 31 or sooner as needed.  **Disclaimer: This note was dictated with voice recognition software. Similar sounding words can inadvertently be transcribed and this note may contain transcription errors which may not have been corrected upon publication of note.**

## 2020-02-25 NOTE — Patient Outreach (Signed)
[facility]) Care Management  02/25/2020  Keyton Bhat Aug 20, 1955 [id number]   Subjective:  Per patient's previous consent, patient gave this RNCM verbal consent to speak with his wife Gregrey Bloyd), and daughter [name] regarding his healthcare needs as needed.   Telephone call to patient's home  / mobile number, no answer, left HIPAA compliant voicemail message for patient's wife Neamiah Sciarra), and requested call back.   Objective:Per KPN (Knowledge Performance Now, point of care tool) and chart review,patient has not had a recent hospitalization. Patient had ED visit on 01/11/2020 for dental pain, right facial/ neck pain/ swelling, and oral cancer. 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     Assessment:Received [employer] plan referral on 01/21/2020. 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 Screening follow up completed andwill follow up for new cancer diagnosis/ other diagnosiscarecoordination.    Plan:RNCM will call patient's wife and / or patient's daughter [name]  for  3rd telephone outreach attempt within7business days, care coordination follow up, and proceed with case closure, after 4th unsuccessful outreach call.      Kebrina Friend H. Annia Friendly, BSN, [place] Management [facility] Telephonic CM Phone: [phone] Fax: [phone]

## 2020-02-26 ENCOUNTER — Other Ambulatory Visit: Payer: Self-pay

## 2020-02-26 ENCOUNTER — Telehealth: Payer: Self-pay | Admitting: Hematology and Oncology

## 2020-02-26 ENCOUNTER — Ambulatory Visit
Admission: RE | Admit: 2020-02-26 | Discharge: 2020-02-26 | Disposition: A | Discharge status: Home / Self Care | Payer: 59 | Source: Ambulatory Visit | Attending: Radiation Oncology | Admitting: Radiation Oncology

## 2020-02-26 DIAGNOSIS — Z5111 Encounter for antineoplastic chemotherapy: Secondary | ICD-10-CM | POA: Diagnosis not present

## 2020-02-26 NOTE — Telephone Encounter (Signed)
Scheduled per 8/25 los. Called and spoke with [name], confirmed added appts, she will look at them in [place]

## 2020-02-27 ENCOUNTER — Ambulatory Visit
Admission: RE | Admit: 2020-02-27 | Discharge: 2020-02-27 | Disposition: A | Discharge status: Home / Self Care | Payer: 59 | Source: Ambulatory Visit | Attending: Radiation Oncology | Admitting: Radiation Oncology

## 2020-02-27 ENCOUNTER — Encounter: Payer: Self-pay | Admitting: Hematology and Oncology

## 2020-02-27 ENCOUNTER — Other Ambulatory Visit: Payer: Self-pay | Admitting: *Deleted

## 2020-02-27 ENCOUNTER — Ambulatory Visit: Payer: Self-pay | Admitting: *Deleted

## 2020-02-27 ENCOUNTER — Other Ambulatory Visit: Payer: Self-pay

## 2020-02-27 ENCOUNTER — Encounter: Payer: Self-pay | Admitting: *Deleted

## 2020-02-27 DIAGNOSIS — Z5111 Encounter for antineoplastic chemotherapy: Secondary | ICD-10-CM | POA: Diagnosis not present

## 2020-02-27 NOTE — Patient Outreach (Signed)
[facility]) Care Management  Sylvia  02/27/2020   Omar Christian 03/31/1956 [id number]  Subjective: Per patient's previous consent,patient gave this RNCM verbal consent to speak with his wife Omar Christian), and daughter Omar Christian) regarding his healthcare needs as needed.Telephone call from patient's home / mobile number, spoke with patient's wife Omar Christian), she stated patient's name, date of birth, and address, she is returning this RNCM's call.   States patient is not doing well and currently on his way back home with daughter from radiation treatment.  Wife gave RNCM the following verbal update on patient's treatment plan since last patient outreach: chemo and radiation treatments weekly, will have an infusion on 02/28/2020 due to decrease kidney function, increasing poor appetite.  Wife states providers are aware of patient's status, continuing to monitor overall status, working to address issues, and being followed closely by cancer center healthcare team.    Patient has G-tube in place and is receiving [employer] nutritional supplement via tube.  States patient is losing weight faster than expected, mouth is very sore from tooth extractions, from radiation, and chemo therapy, not eating well, family continues to encourage patient to eat, and drink fluids, to prevent dehydration.   Patient is eating soft diet as tolerated.    Patient is continuing to do mouth exercise to increase oral mobility.  States patient has attended all follow up appointments and planning to obtain a new primary provider in the future once condition stabilizes, oncologist are managing care at this time, will follow up with provider for primary provider recommendations.    Discussed importance of hospital follow up with primary MD, wife voices understanding, and states she will follow up as appropriate.  Wife states she is aware of signs/ symptoms to report, how to reach provider if  needed after hours, when to go to ED, and / or call 911.   States patient blood pressure was high last week, started on blood pressure medication, periodic blood monitoring at home by patient's daughter, readings unknown, and blood pressure is being taking at each treatment visit.    Wife states patient hearing issue will be addressed at a later time and cancer treatment is the priority at this time.  Wife states she has received, will  reviewed [facility] Welcome packet, received requested educational materials,  has no questions at this time, and will contact this RNCM if questions arise.    Wife states patient does not have any Air traffic controller, transportation, Data processing manager, or pharmacy needs at this time.  States she is very appreciative of the follow up and is in agreement to continue to receive [place] Management information / services on patient's behalf.    Objective: Per KPN (Knowledge Performance Now, point of care tool) and chart review,patient had an  observation hospitalization 8/4/201 - 02/05/2020 for Poor dentition, status post Multiple extraction of tooth numbers 4,5,12,13,31, and 32, 3 quadrants of alveoloplasty, Gross debridement of remaining teeth on 02/04/2020. Patient had ED visit on 01/11/2020 for dental pain, right facial/ neck pain/ swelling, and oral cancer. 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      Encounter Medications:  Outpatient Encounter Medications as of 02/27/2020  Medication Sig Note  . allopurinol (ZYLOPRIM) 100 MG tablet Take 1 tablet (100 mg total) by mouth daily.   Marland Kitchen atorvastatin (LIPITOR) 20 MG tablet Take 1 tablet (20 mg total) by mouth daily.   Marland Kitchen  dexamethasone (DECADRON) 4 MG tablet Take 2 tablets (8 mg total) by mouth daily. Take daily for 3 days after chemo. Take with food. 02/10/2020: Not started yet  . LORazepam (ATIVAN) 0.5 MG tablet Take 1 tablet  (0.5 mg total) by mouth at bedtime as needed (Nausea or vomiting). 02/10/2020: Not yet started  . metoprolol succinate (TOPROL XL) 25 MG 24 hr tablet Take 1 tablet (25 mg total) by mouth daily.   . Nutritional Supplements (KATE FARMS STANDARD 1.4) LIQD 488 mLs by Gastrostomy Tube route 4 (four) times daily.   . ondansetron (ZOFRAN) 8 MG tablet Take 1 tablet (8 mg total) by mouth 2 (two) times daily as needed. Start on the third day after chemotherapy. 02/10/2020: Not started yet  . oxyCODONE-acetaminophen (PERCOCET/ROXICET) 5-325 MG tablet Take 1 tablet by mouth every 4 (four) hours as needed.  01/28/2020: Have not started  . prochlorperazine (COMPAZINE) 10 MG tablet Take 1 tablet (10 mg total) by mouth every 6 (six) hours as needed (Nausea or vomiting). 02/10/2020: Not started yet  . sodium fluoride (PREVIDENT 5000 PLUS) 1.1 % CREA dental cream Apply to tooth brush. Brush teeth for 2 minutes. Spit out excess-DO NOT swallow. DO NOT rinse afterwards. Repeat nightly.   Marland Kitchen acetaminophen (TYLENOL) 650 MG CR tablet Take 650 mg by mouth daily.  (Patient not taking: Reported on 02/27/2020)   . colchicine 0.6 MG tablet Take 1 tablet (0.6 mg total) by mouth daily. (Patient not taking: Reported on 02/27/2020) 02/27/2020: Wife states has not needed.   . lidocaine-prilocaine (EMLA) cream Apply to affected area once (Patient not taking: Reported on 02/27/2020) 02/10/2020: Not started yet   No facility-administered encounter medications on file as of 02/27/2020.    Functional Status:  In your present state of health, do you have any difficulty performing the following activities: 02/10/2020 02/05/2020  Hearing? N Y  Comment - Bilateral hearing loss  Vision? N N  Comment - -  Difficulty concentrating or making decisions? N N  Walking or climbing stairs? N N  Dressing or bathing? - N  Doing errands, shopping? - N  Conservation officer, nature and eating ? - -  Using the Toilet? - -  In the past six months, have you accidently leaked  urine? - -  Do you have problems with loss of bowel control? - -  Managing your Medications? - -  Managing your Finances? - -  Comment - -  Housekeeping or managing your Housekeeping? - -  Comment - -  Some recent data might be hidden    Fall/Depression Screening: Fall Risk  02/27/2020 01/26/2020 09/16/2019  Falls in the past year? 0 0 0  Number falls in past yr: 0 0 -  Injury with Fall? 0 0 -  Risk for fall due to : No Fall Risks Other (Comment) -  Risk for fall due to: Comment - New cancer diagnosis -  Follow up Falls evaluation completed;Education provided Falls prevention discussed;Education provided -   PHQ 2/9 Scores 01/26/2020 09/16/2019  PHQ - 2 Score 0 0    Assessment:  Received [employer] plan referral on 01/21/2020. 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 Screening follow up completed andwill follow up for new cancer diagnosis/ other diagnosiscarecoordination.  Goals    . Patient's wife stated  patient would be fine if he did not have cancer and wants the best care.     CARE PLAN ENTRY (see longitudinal plan of care for additional care plan information)  Current  Barriers:  . Care Coordination needs related to new oral cancer  in a patient with hypertension and gout. (disease states)  Nurse Case Manager Clinical Goal(s):  Marland Kitchen Over the next 45 days, patient will verbalize understanding of plan for oral cancer treatment . Over the next 21 days, patient will attend all scheduled medical appointments: Radiation Oncologist MD, Medical Oncologist .  Interventions:  . Inter-disciplinary care team collaboration (see longitudinal plan of care) . Provided education to patient and/or caregiver about advanced directives . Provided education to patient re: [place] Management services and care coordination services, how to access insurance benefits . Reviewed medications with patient and discussed new medications that have been added since last  patient outreach. . Discussed plans with patient for ongoing care management follow up and provided patient with direct contact information for care management team . Provided patient with [facility] handout educational materials related to organ donation per patient request . Reviewed scheduled/upcoming provider appointments including: Radiation Oncologist and Medical Oncologist  Patient Self Care Activities:  . Attends all scheduled provider appointments . Performs ADL's independently . Patient working on maintaining hydration and increasing appetite . Patient is hard of hearing and has assistance from family as needed.  Updated 02/27/2020           Plan:  RNCM will send primary MD barrier/ involvement letter and route assessment.  RNCM will call patient's wife and / or patient's daughter Omar Christian)for telephone outreach attempt within30business days, care coordination follow up, and proceed with case closure, after 4th unsuccessful outreach call.       Annarae Macnair H. Annia Friendly, BSN, [place] Management [facility] Telephonic CM Phone: [phone] Fax: [phone]

## 2020-02-28 ENCOUNTER — Other Ambulatory Visit: Payer: Self-pay

## 2020-02-28 ENCOUNTER — Inpatient Hospital Stay: Payer: 59

## 2020-02-28 VITALS — BP 134/70 | HR 86 | Temp 98.8°F | Resp 18

## 2020-02-28 DIAGNOSIS — Z5111 Encounter for antineoplastic chemotherapy: Secondary | ICD-10-CM | POA: Diagnosis not present

## 2020-02-28 DIAGNOSIS — C099 Malignant neoplasm of tonsil, unspecified: Secondary | ICD-10-CM

## 2020-02-28 MED ORDER — SODIUM CHLORIDE 0.9 % IV SOLN
Freq: Once | INTRAVENOUS | Status: AC
  Administered 2020-02-28: 1000 mL via INTRAVENOUS
  Filled 2020-02-28: qty 250

## 2020-02-28 MED ORDER — MAGNESIUM SULFATE 2 GM/50ML IV SOLN
2.0000 g | Freq: Once | INTRAVENOUS | Status: AC
  Administered 2020-02-28: 2 g via INTRAVENOUS

## 2020-02-28 MED ORDER — MAGNESIUM SULFATE 2 GM/50ML IV SOLN
INTRAVENOUS | Status: AC
  Filled 2020-02-28: qty 50

## 2020-02-28 MED ORDER — POTASSIUM CHLORIDE 10 MEQ/100ML IV SOLN
10.0000 meq | Freq: Once | INTRAVENOUS | Status: AC
  Administered 2020-02-28: 10 meq via INTRAVENOUS

## 2020-02-28 MED ORDER — POTASSIUM CHLORIDE 10 MEQ/100ML IV SOLN
INTRAVENOUS | Status: AC
  Filled 2020-02-28: qty 100

## 2020-02-28 NOTE — Patient Instructions (Signed)

## 2020-03-01 ENCOUNTER — Other Ambulatory Visit: Payer: Self-pay | Admitting: Radiation Oncology

## 2020-03-01 ENCOUNTER — Other Ambulatory Visit: Payer: Self-pay | Admitting: *Deleted

## 2020-03-01 ENCOUNTER — Ambulatory Visit
Admission: RE | Admit: 2020-03-01 | Discharge: 2020-03-01 | Disposition: A | Discharge status: Home / Self Care | Payer: 59 | Source: Ambulatory Visit | Attending: Radiation Oncology | Admitting: Radiation Oncology

## 2020-03-01 ENCOUNTER — Other Ambulatory Visit: Payer: Self-pay

## 2020-03-01 DIAGNOSIS — Z5111 Encounter for antineoplastic chemotherapy: Secondary | ICD-10-CM | POA: Diagnosis not present

## 2020-03-01 DIAGNOSIS — C099 Malignant neoplasm of tonsil, unspecified: Secondary | ICD-10-CM

## 2020-03-01 MED ORDER — MAGIC MOUTHWASH W/LIDOCAINE: 5.0000 mL | Freq: Three times a day (TID) | ORAL | 1 refills | Status: DC | PRN

## 2020-03-01 MED ORDER — LIDOCAINE VISCOUS HCL 2 % MT SOLN: OROMUCOSAL | 4 refills | Status: DC

## 2020-03-01 NOTE — Progress Notes (Signed)
Patient Care Team: Chrismon, Vickki Muff, PA as PCP - General (Family Medicine) [name], MD as Consulting Physician (Cardiothoracic Surgery) [name], MD as Consulting Physician (Cardiology) Serena Colonel, RN as [place] Management [name], MD as Consulting Physician (Hematology and Oncology) [name], MD as Attending Physician (Radiation Oncology) [name], [name], RN as Oncology Nurse Navigator [name], [name], [place] as Speech Language Pathologist (Speech Pathology) [name], DDS as Consulting Physician (Dentistry) [name], [name], PT as Physical Therapist (Physical Therapy) [name], LCSW as Social Worker (General Practice) [name], LCSW as Social Worker [name], RD as Dietitian (Nutrition)  DIAGNOSIS:    ICD-10-CM   1. Tonsillar cancer ([place])  C09.9     SUMMARY OF ONCOLOGIC HISTORY: Oncology History  Tonsillar cancer ([place])  01/14/2020 Initial Diagnosis   Squamous cell carcinoma with basaloid features   01/26/2020 PET scan   Right tonsillar mass extending to bilateral tongue base 4.9 cm.  Two right cervical lymph nodes metastases measuring 2.5 cm and 2.1 cm   02/17/2020 -  Chemotherapy   The patient had dexamethasone (DECADRON) 4 MG tablet, 8 mg, Oral, Daily, 1 of 1 cycle, Start date: 02/02/2020, End date: -- palonosetron (ALOXI) injection 0.25 mg, 0.25 mg, Intravenous,  Once, 1 of 3 cycles Administration: 0.25 mg (02/17/2020) CISplatin (PLATINOL) 173 mg in sodium chloride 0.9 % 500 mL chemo infusion, 75 mg/m2 = 173 mg (100 % of original dose 75 mg/m2), Intravenous,  Once, 1 of 3 cycles Dose modification: 75 mg/m2 (original dose 75 mg/m2, Cycle 1, Reason: Provider Judgment), 60 mg/m2 (original dose 75 mg/m2, Cycle 2, Reason: Dose not tolerated) Administration: 173 mg (02/17/2020) fosaprepitant (EMEND) 150 mg in sodium chloride 0.9 % 145 mL IVPB, 150 mg, Intravenous,  Once, 1  of 3 cycles Administration: 150 mg (02/17/2020)  for chemotherapy treatment.    02/17/2020 Cancer Staging   Staging form: Pharynx - P16 Negative Oropharynx, AJCC 8th Edition - Clinical stage from 02/17/2020: Stage IVA (cT4a, cN2b, cM0, p16+) - Signed by [name], MD on 03/02/2020   Cancer of overlapping sites of tonsil [facility])  01/27/2020 Cancer Staging   Staging form: Pharynx - HPV-Mediated Oropharynx, AJCC 8th Edition - Clinical stage from 01/27/2020: Stage III (cT4, cN1, cM0, p16+) - Signed by [name], MD on 01/31/2020   01/31/2020 Initial Diagnosis   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[place])     CHIEF COMPLIANT: Cycle 1 Day 15 Cisplatin  INTERVAL HISTORY: Omar Christian is a 64 y.o. with above-mentioned history of tonsil cancercurrently on treatment with radiation and chemotherapy with cisplatin.He presents to the clinic todayfollowing cycle 1 for a toxicity check.  He is having worsening throat discomfort.  His blood levels today also show anemia and thrombocytopenia.  His oral intake of fluids is low.  His weight however has improved because of tube feeds.  Last week he was felt to be dehydrated.  He denies any nausea or vomiting.  He does not have much of an appetite.  ALLERGIES:  has No Known Allergies.  MEDICATIONS:  Current Outpatient Medications  Medication Sig Dispense Refill   acetaminophen (TYLENOL) 650 MG CR tablet Take 650 mg by mouth daily.  (Patient not taking: Reported on 02/27/2020)     allopurinol (ZYLOPRIM) 100 MG tablet Take 1 tablet (100 mg total) by mouth daily. 30 tablet 12   atorvastatin (LIPITOR) 20 MG tablet Take 1 tablet (20 mg total) by mouth daily.  90 tablet 3   colchicine 0.6 MG tablet Take 1 tablet (0.6 mg total) by mouth daily. (Patient not taking: Reported on 02/27/2020) 30 tablet 0   dexamethasone (DECADRON) 4 MG tablet Take 2 tablets (8 mg total) by mouth daily. Take daily for 3 days after chemo. Take with food. 20 tablet 0   lidocaine  (XYLOCAINE) 2 % solution Patient: Mix 1part 2% viscous lidocaine, 1part H20. Swish & swallow 44mL of diluted mixture, 65min before eating and at bedtime, up to 5xs daily. 200 mL 4   lidocaine-prilocaine (EMLA) cream Apply to affected area once (Patient not taking: Reported on 02/27/2020) 30 g 3   LORazepam (ATIVAN) 0.5 MG tablet Take 1 tablet (0.5 mg total) by mouth at bedtime as needed (Nausea or vomiting). 30 tablet 0   magic mouthwash w/lidocaine SOLN Take 5 mLs by mouth 3 (three) times daily as needed for mouth pain. 240 mL 1   metoprolol succinate (TOPROL XL) 25 MG 24 hr tablet Take 1 tablet (25 mg total) by mouth daily. 30 tablet 3   Nutritional Supplements (KATE FARMS STANDARD 1.4) LIQD 488 mLs by Gastrostomy Tube route 4 (four) times daily. 2730 mL 6   nystatin (MYCOSTATIN) 100000 UNIT/ML suspension Take 5 mLs (500,000 Units total) by mouth in the morning, at noon, and at bedtime. 60 mL 0   ondansetron (ZOFRAN) 8 MG tablet Take 1 tablet (8 mg total) by mouth 2 (two) times daily as needed. Start on the third day after chemotherapy. 30 tablet 1   oxyCODONE-acetaminophen (PERCOCET/ROXICET) 5-325 MG tablet Take 1 tablet by mouth every 4 (four) hours as needed.      prochlorperazine (COMPAZINE) 10 MG tablet Take 1 tablet (10 mg total) by mouth every 6 (six) hours as needed (Nausea or vomiting). 30 tablet 1   sodium fluoride (PREVIDENT 5000 PLUS) 1.1 % CREA dental cream Apply to tooth brush. Brush teeth for 2 minutes. Spit out excess-DO NOT swallow. DO NOT rinse afterwards. Repeat nightly. 51 g prn   No current facility-administered medications for this visit.    PHYSICAL EXAMINATION: ECOG PERFORMANCE STATUS: 2 - Symptomatic, <50% confined to bed  Vitals:   03/02/20 0901  BP: 138/72  Pulse: 93  Resp: 18  Temp: 98.5 F (36.9 C)  SpO2: 100%   Filed Weights   03/02/20 0901  Weight: 215 lb 12.8 oz (97.9 kg)    LABORATORY DATA:  I have reviewed the data as listed CMP Latest Ref  Rng & Units 02/25/2020 02/17/2020 02/05/2020  Glucose 70 - 99 mg/dL 172(H) 196(H) 219(H)  BUN 8 - 23 mg/dL 36(H) 12 10  Creatinine 0.61 - 1.24 mg/dL 1.82(H) 0.93 0.98  Sodium 135 - 145 mmol/L 133(L) 137 138  Potassium 3.5 - 5.1 mmol/L 3.4(L) 3.9 3.9  Chloride 98 - 111 mmol/L 95(L) 103 100  CO2 22 - 32 mmol/L 27 21(L) 25  Calcium 8.9 - 10.3 mg/dL 10.0 10.0 9.2  Total Protein 6.0 - 8.5 g/dL - - -  Total Bilirubin 0.0 - 1.2 mg/dL - - -  Alkaline Phos 39 - 117 IU/L - - -  AST 0 - 40 IU/L - - -  ALT 0 - 44 IU/L - - -    Lab Results  Component Value Date   WBC 5.7 03/02/2020   HGB 10.3 (L) 03/02/2020   HCT 31.7 (L) 03/02/2020   MCV 83.6 03/02/2020   PLT 78 (L) 03/02/2020   NEUTROABS 4.8 03/02/2020    ASSESSMENT & PLAN:  Tonsillar cancer ([place]) 01/14/2020: 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 Two right cervical lymph nodesmetastases measuring 2.5 cm and 2.1 cm T4N2BM0 stage IVa HPV status:Positive  Treatment plan:Concurrent chemoradiation with cisplatin every 3 weeks x4. Today iscycle 2 Patient gets IV fluids today HTN H/O gout: treated with colchicine.  Chemo toxicities: 1.  Weight loss:  on PEG tube feeds. 2. Fatigue 3.  Worsening hearing impairment patient understands the risks of continuing cisplatin and wants to push forward. 4.  Nausea  5.  Dehydration: He is getting IV fluids every week.  Nutritionist is going to advise him on fluid replacement through the PEG tube 6.  Renal insufficiency 7.  Thrombocytopenia due to lingering effects of chemotherapy.  I will reduce the dosage of cisplatin today.  RTC twice a week for IV fluids    No orders of the defined types were placed in this encounter.  The patient has a good understanding of the overall plan. he agrees with it. he will call with any problems that may develop before the next visit here.  Total time  spent: 30 mins including face to face time and time spent for planning, charting and coordination of care  [name], MD 03/02/2020  I, Cloyde Reams Dorshimer, am acting as scribe for Dr. [name].  I have reviewed the above documentation for accuracy and completeness, and I agree with the above.

## 2020-03-02 ENCOUNTER — Inpatient Hospital Stay: Payer: 59

## 2020-03-02 ENCOUNTER — Inpatient Hospital Stay
Admission: EM | Admit: 2020-03-02 | Discharge: 2020-03-05 | DRG: 871 | Disposition: A | Discharge status: Home / Self Care | Payer: 59 | Attending: Internal Medicine | Admitting: Internal Medicine

## 2020-03-02 ENCOUNTER — Ambulatory Visit
Admission: RE | Admit: 2020-03-02 | Discharge: 2020-03-02 | Disposition: A | Discharge status: Home / Self Care | Payer: 59 | Source: Ambulatory Visit | Attending: Radiation Oncology | Admitting: Radiation Oncology

## 2020-03-02 ENCOUNTER — Other Ambulatory Visit: Payer: Self-pay

## 2020-03-02 ENCOUNTER — Inpatient Hospital Stay: Payer: 59 | Admitting: Nutrition

## 2020-03-02 ENCOUNTER — Emergency Department: Payer: 59

## 2020-03-02 ENCOUNTER — Inpatient Hospital Stay (HOSPITAL_BASED_OUTPATIENT_CLINIC_OR_DEPARTMENT_OTHER): Payer: 59 | Admitting: Hematology and Oncology

## 2020-03-02 ENCOUNTER — Encounter: Payer: Self-pay | Admitting: *Deleted

## 2020-03-02 DIAGNOSIS — K219 Gastro-esophageal reflux disease without esophagitis: Secondary | ICD-10-CM | POA: Diagnosis present

## 2020-03-02 DIAGNOSIS — Z79899 Other long term (current) drug therapy: Secondary | ICD-10-CM

## 2020-03-02 DIAGNOSIS — Z7952 Long term (current) use of systemic steroids: Secondary | ICD-10-CM

## 2020-03-02 DIAGNOSIS — Z7189 Other specified counseling: Secondary | ICD-10-CM | POA: Diagnosis not present

## 2020-03-02 DIAGNOSIS — Z951 Presence of aortocoronary bypass graft: Secondary | ICD-10-CM

## 2020-03-02 DIAGNOSIS — R7989 Other specified abnormal findings of blood chemistry: Secondary | ICD-10-CM | POA: Diagnosis not present

## 2020-03-02 DIAGNOSIS — Z20822 Contact with and (suspected) exposure to covid-19: Secondary | ICD-10-CM | POA: Diagnosis present

## 2020-03-02 DIAGNOSIS — M109 Gout, unspecified: Secondary | ICD-10-CM

## 2020-03-02 DIAGNOSIS — E785 Hyperlipidemia, unspecified: Secondary | ICD-10-CM | POA: Diagnosis present

## 2020-03-02 DIAGNOSIS — D696 Thrombocytopenia, unspecified: Secondary | ICD-10-CM

## 2020-03-02 DIAGNOSIS — B370 Candidal stomatitis: Secondary | ICD-10-CM

## 2020-03-02 DIAGNOSIS — D72818 Other decreased white blood cell count: Secondary | ICD-10-CM | POA: Diagnosis not present

## 2020-03-02 DIAGNOSIS — R6520 Severe sepsis without septic shock: Secondary | ICD-10-CM | POA: Diagnosis present

## 2020-03-02 DIAGNOSIS — A419 Sepsis, unspecified organism: Secondary | ICD-10-CM | POA: Diagnosis present

## 2020-03-02 DIAGNOSIS — Z808 Family history of malignant neoplasm of other organs or systems: Secondary | ICD-10-CM

## 2020-03-02 DIAGNOSIS — Z8701 Personal history of pneumonia (recurrent): Secondary | ICD-10-CM | POA: Diagnosis not present

## 2020-03-02 DIAGNOSIS — R739 Hyperglycemia, unspecified: Secondary | ICD-10-CM

## 2020-03-02 DIAGNOSIS — E872 Acidosis, unspecified: Secondary | ICD-10-CM

## 2020-03-02 DIAGNOSIS — N170 Acute kidney failure with tubular necrosis: Secondary | ICD-10-CM | POA: Diagnosis present

## 2020-03-02 DIAGNOSIS — N179 Acute kidney failure, unspecified: Secondary | ICD-10-CM | POA: Diagnosis not present

## 2020-03-02 DIAGNOSIS — K1233 Oral mucositis (ulcerative) due to radiation: Secondary | ICD-10-CM | POA: Diagnosis present

## 2020-03-02 DIAGNOSIS — Z931 Gastrostomy status: Secondary | ICD-10-CM

## 2020-03-02 DIAGNOSIS — D61810 Antineoplastic chemotherapy induced pancytopenia: Secondary | ICD-10-CM | POA: Diagnosis present

## 2020-03-02 DIAGNOSIS — Y92238 Other place in hospital as the place of occurrence of the external cause: Secondary | ICD-10-CM | POA: Diagnosis not present

## 2020-03-02 DIAGNOSIS — C099 Malignant neoplasm of tonsil, unspecified: Secondary | ICD-10-CM | POA: Diagnosis present

## 2020-03-02 DIAGNOSIS — N171 Acute kidney failure with acute cortical necrosis: Secondary | ICD-10-CM | POA: Diagnosis not present

## 2020-03-02 DIAGNOSIS — D849 Immunodeficiency, unspecified: Secondary | ICD-10-CM | POA: Diagnosis present

## 2020-03-02 DIAGNOSIS — I2510 Atherosclerotic heart disease of native coronary artery without angina pectoris: Secondary | ICD-10-CM | POA: Diagnosis present

## 2020-03-02 DIAGNOSIS — R000 Tachycardia, unspecified: Secondary | ICD-10-CM | POA: Diagnosis not present

## 2020-03-02 DIAGNOSIS — Z8739 Personal history of other diseases of the musculoskeletal system and connective tissue: Secondary | ICD-10-CM

## 2020-03-02 DIAGNOSIS — Z515 Encounter for palliative care: Secondary | ICD-10-CM | POA: Diagnosis not present

## 2020-03-02 DIAGNOSIS — J9601 Acute respiratory failure with hypoxia: Secondary | ICD-10-CM | POA: Diagnosis present

## 2020-03-02 DIAGNOSIS — T373X5A Adverse effect of other antiprotozoal drugs, initial encounter: Secondary | ICD-10-CM | POA: Diagnosis not present

## 2020-03-02 DIAGNOSIS — Z5111 Encounter for antineoplastic chemotherapy: Secondary | ICD-10-CM | POA: Diagnosis not present

## 2020-03-02 DIAGNOSIS — H9190 Unspecified hearing loss, unspecified ear: Secondary | ICD-10-CM | POA: Diagnosis present

## 2020-03-02 DIAGNOSIS — Z8249 Family history of ischemic heart disease and other diseases of the circulatory system: Secondary | ICD-10-CM

## 2020-03-02 DIAGNOSIS — T451X5A Adverse effect of antineoplastic and immunosuppressive drugs, initial encounter: Secondary | ICD-10-CM | POA: Diagnosis present

## 2020-03-02 DIAGNOSIS — R509 Fever, unspecified: Secondary | ICD-10-CM

## 2020-03-02 DIAGNOSIS — I10 Essential (primary) hypertension: Secondary | ICD-10-CM

## 2020-03-02 DIAGNOSIS — R778 Other specified abnormalities of plasma proteins: Secondary | ICD-10-CM

## 2020-03-02 DIAGNOSIS — Z95828 Presence of other vascular implants and grafts: Secondary | ICD-10-CM

## 2020-03-02 DIAGNOSIS — L271 Localized skin eruption due to drugs and medicaments taken internally: Secondary | ICD-10-CM | POA: Diagnosis not present

## 2020-03-02 LAB — CBC WITH DIFFERENTIAL (CANCER CENTER ONLY)
Abs Immature Granulocytes: 0.02 10*3/uL (ref 0.00–0.07)
Basophils Absolute: 0 10*3/uL (ref 0.0–0.1)
Basophils Relative: 0 %
Eosinophils Absolute: 0.1 10*3/uL (ref 0.0–0.5)
Eosinophils Relative: 1 %
HCT: 31.7 % — ABNORMAL LOW (ref 39.0–52.0)
Hemoglobin: 10.3 g/dL — ABNORMAL LOW (ref 13.0–17.0)
Immature Granulocytes: 0 %
Lymphocytes Relative: 10 %
Lymphs Abs: 0.6 10*3/uL — ABNORMAL LOW (ref 0.7–4.0)
MCH: 27.2 pg (ref 26.0–34.0)
MCHC: 32.5 g/dL (ref 30.0–36.0)
MCV: 83.6 fL (ref 80.0–100.0)
Monocytes Absolute: 0.2 10*3/uL (ref 0.1–1.0)
Monocytes Relative: 4 %
Neutro Abs: 4.8 10*3/uL (ref 1.7–7.7)
Neutrophils Relative %: 85 %
Platelet Count: 78 10*3/uL — ABNORMAL LOW (ref 150–400)
RBC: 3.79 MIL/uL — ABNORMAL LOW (ref 4.22–5.81)
RDW: 13.8 % (ref 11.5–15.5)
WBC Count: 5.7 10*3/uL (ref 4.0–10.5)
nRBC: 0 % (ref 0.0–0.2)

## 2020-03-02 LAB — BASIC METABOLIC PANEL - CANCER CENTER ONLY
Anion gap: 8 (ref 5–15)
BUN: 20 mg/dL (ref 8–23)
CO2: 28 mmol/L (ref 22–32)
Calcium: 10 mg/dL (ref 8.9–10.3)
Chloride: 99 mmol/L (ref 98–111)
Creatinine: 1.23 mg/dL (ref 0.61–1.24)
GFR, Est AFR Am: >60 mL/min (ref >60)
GFR, Estimated: >60 mL/min (ref >60)
Glucose, Bld: 167 mg/dL — ABNORMAL HIGH (ref 70–99)
Potassium: 4.4 mmol/L (ref 3.5–5.1)
Sodium: 135 mmol/L (ref 135–145)

## 2020-03-02 LAB — CBC WITH DIFFERENTIAL/PLATELET
Abs Immature Granulocytes: 0.01 10*3/uL (ref 0.00–0.07)
Basophils Absolute: 0 10*3/uL (ref 0.0–0.1)
Basophils Relative: 0 %
Eosinophils Absolute: 0 10*3/uL (ref 0.0–0.5)
Eosinophils Relative: 0 %
HCT: 32 % — ABNORMAL LOW (ref 39.0–52.0)
Hemoglobin: 10.3 g/dL — ABNORMAL LOW (ref 13.0–17.0)
Immature Granulocytes: 0 %
Lymphocytes Relative: 1 %
Lymphs Abs: 0 10*3/uL — ABNORMAL LOW (ref 0.7–4.0)
MCH: 27.1 pg (ref 26.0–34.0)
MCHC: 32.2 g/dL (ref 30.0–36.0)
MCV: 84.2 fL (ref 80.0–100.0)
Monocytes Absolute: 0.1 10*3/uL (ref 0.1–1.0)
Monocytes Relative: 3 %
Neutro Abs: 3.2 10*3/uL (ref 1.7–7.7)
Neutrophils Relative %: 96 %
Platelets: 68 10*3/uL — ABNORMAL LOW (ref 150–400)
RBC: 3.8 MIL/uL — ABNORMAL LOW (ref 4.22–5.81)
RDW: 13.9 % (ref 11.5–15.5)
Smear Review: NORMAL
WBC: 3.4 10*3/uL — ABNORMAL LOW (ref 4.0–10.5)
nRBC: 0 % (ref 0.0–0.2)

## 2020-03-02 LAB — COMPREHENSIVE METABOLIC PANEL
ALT: 26 U/L (ref 0–44)
AST: 31 U/L (ref 15–41)
Albumin: 3.4 g/dL — ABNORMAL LOW (ref 3.5–5.0)
Alkaline Phosphatase: 90 U/L (ref 38–126)
Anion gap: 11 (ref 5–15)
BUN: 22 mg/dL (ref 8–23)
CO2: 23 mmol/L (ref 22–32)
Calcium: 8.6 mg/dL — ABNORMAL LOW (ref 8.9–10.3)
Chloride: 98 mmol/L (ref 98–111)
Creatinine, Ser: 1.25 mg/dL — ABNORMAL HIGH (ref 0.61–1.24)
GFR calc Af Amer: >60 mL/min (ref >60)
GFR calc non Af Amer: >60 mL/min (ref >60)
Glucose, Bld: 232 mg/dL — ABNORMAL HIGH (ref 70–99)
Potassium: 4.3 mmol/L (ref 3.5–5.1)
Sodium: 132 mmol/L — ABNORMAL LOW (ref 135–145)
Total Bilirubin: 0.7 mg/dL (ref 0.3–1.2)
Total Protein: 6.5 g/dL (ref 6.5–8.1)

## 2020-03-02 LAB — MAGNESIUM
Magnesium: 1.4 mg/dL — CL (ref 1.7–2.4)
Magnesium: 1.7 mg/dL (ref 1.7–2.4)

## 2020-03-02 LAB — TROPONIN I (HIGH SENSITIVITY): Troponin I (High Sensitivity): 24 ng/L — ABNORMAL HIGH (ref <18)

## 2020-03-02 LAB — PROTIME-INR
INR: 1 (ref 0.8–1.2)
Prothrombin Time: 13 seconds (ref 11.4–15.2)

## 2020-03-02 LAB — APTT: aPTT: 35 seconds (ref 24–36)

## 2020-03-02 LAB — LACTIC ACID, PLASMA: Lactic Acid, Venous: 4 mmol/L (ref 0.5–1.9)

## 2020-03-02 MED ORDER — LACTATED RINGERS IV BOLUS (SEPSIS)
1000.0000 mL | Freq: Once | INTRAVENOUS | Status: AC
  Administered 2020-03-02: 1000 mL via INTRAVENOUS

## 2020-03-02 MED ORDER — LACTATED RINGERS IV BOLUS (SEPSIS)
1000.0000 mL | Freq: Once | INTRAVENOUS | Status: AC
  Administered 2020-03-03: 1000 mL via INTRAVENOUS

## 2020-03-02 MED ORDER — SODIUM CHLORIDE 0.9% FLUSH
10.0000 mL | INTRAVENOUS | Status: DC | PRN
  Administered 2020-03-02: 10 mL via INTRAVENOUS
  Filled 2020-03-02: qty 10

## 2020-03-02 MED ORDER — METRONIDAZOLE IN NACL 5-0.79 MG/ML-% IV SOLN
500.0000 mg | Freq: Once | INTRAVENOUS | Status: AC
  Administered 2020-03-02: 500 mg via INTRAVENOUS
  Filled 2020-03-02: qty 100

## 2020-03-02 MED ORDER — VANCOMYCIN HCL IN DEXTROSE 1-5 GM/200ML-% IV SOLN
1000.0000 mg | Freq: Once | INTRAVENOUS | Status: AC
  Administered 2020-03-02: 1000 mg via INTRAVENOUS
  Filled 2020-03-02: qty 200

## 2020-03-02 MED ORDER — NYSTATIN 100000 UNIT/ML MT SUSP: 5.0000 mL | Freq: Three times a day (TID) | OROMUCOSAL | 0 refills | Status: DC

## 2020-03-02 MED ORDER — SODIUM CHLORIDE 0.9 % IV SOLN
10.0000 mg | Freq: Once | INTRAVENOUS | Status: AC
  Administered 2020-03-02: 10 mg via INTRAVENOUS
  Filled 2020-03-02: qty 10

## 2020-03-02 MED ORDER — SODIUM CHLORIDE 0.9% FLUSH
10.0000 mL | INTRAVENOUS | Status: DC | PRN
  Administered 2020-03-02: 10 mL
  Filled 2020-03-02: qty 10

## 2020-03-02 MED ORDER — ACETAMINOPHEN 500 MG PO TABS
1000.0000 mg | ORAL_TABLET | Freq: Once | ORAL | Status: AC
  Administered 2020-03-02: 1000 mg via ORAL
  Filled 2020-03-02: qty 2

## 2020-03-02 MED ORDER — SODIUM CHLORIDE 0.9 % IV SOLN
Freq: Once | INTRAVENOUS | Status: AC
  Filled 2020-03-02: qty 10

## 2020-03-02 MED ORDER — SODIUM CHLORIDE 0.9 % IV SOLN
INTRAVENOUS | Status: DC
  Filled 2020-03-02: qty 250

## 2020-03-02 MED ORDER — PALONOSETRON HCL INJECTION 0.25 MG/5ML
INTRAVENOUS | Status: AC
  Filled 2020-03-02: qty 5

## 2020-03-02 MED ORDER — INSULIN ASPART 100 UNIT/ML ~~LOC~~ SOLN
0.0000 [IU] | SUBCUTANEOUS | Status: DC
  Filled 2020-03-02: qty 1

## 2020-03-02 MED ORDER — HEPARIN SOD (PORK) LOCK FLUSH 100 UNIT/ML IV SOLN
500.0000 [IU] | Freq: Once | INTRAVENOUS | Status: AC | PRN
  Administered 2020-03-02: 500 [IU]
  Filled 2020-03-02: qty 5

## 2020-03-02 MED ORDER — SODIUM CHLORIDE 0.9 % IV SOLN
2.0000 g | Freq: Once | INTRAVENOUS | Status: AC
  Administered 2020-03-02: 2 g via INTRAVENOUS
  Filled 2020-03-02: qty 2

## 2020-03-02 MED ORDER — PALONOSETRON HCL INJECTION 0.25 MG/5ML
0.2500 mg | Freq: Once | INTRAVENOUS | Status: AC
  Administered 2020-03-02: 0.25 mg via INTRAVENOUS

## 2020-03-02 MED ORDER — SODIUM CHLORIDE 0.9 % IV SOLN
60.0000 mg/m2 | Freq: Once | INTRAVENOUS | Status: AC
  Administered 2020-03-02: 138 mg via INTRAVENOUS
  Filled 2020-03-02: qty 138

## 2020-03-02 MED ORDER — SODIUM CHLORIDE 0.9 % IV SOLN
150.0000 mg | Freq: Once | INTRAVENOUS | Status: AC
  Administered 2020-03-02: 150 mg via INTRAVENOUS
  Filled 2020-03-02: qty 150

## 2020-03-02 NOTE — Progress Notes (Signed)
CODE SEPSIS - PHARMACY COMMUNICATION  **Broad Spectrum Antibiotics should be administered within 1 hour of Sepsis diagnosis**  Time Code Sepsis Called/Page Received: 2213  Antibiotics Ordered: Cefepime  Time of 1st antibiotic administration: 2251  Additional action taken by pharmacy: n/a  If necessary, Name of Provider/Nurse Contacted: n/a    Ena Dawley ,PharmD Clinical Pharmacist  03/02/2020  10:30 PM

## 2020-03-02 NOTE — H&P (Signed)
[facility] History and Physical  Omar Christian FHL:[id number] DOB: 1955-11-22 DOA: 03/02/2020  Referring physician: Dr. Tamala Julian PCP: Margo Common, [place]   Chief Complaint: Fever  HPI: Omar Christian is a 64 y.o. 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  Since that time he has been undergoing treatment with chemo and radiation, and has had Port-A-Cath and PEG tube placed.  Seen earlier today by oncologist and received chemotherapy.  At that visit noted to have thrombocytopenia thought to be secondary to chemotherapy, and dose of cisplatin was reduced.  Patient interviewed at bedside with his daughter [name] who assisted with history due to difficulty with the patient speaking.  They report that they went to chemo and radiation this morning as planned and then later upon returning home they noticed chills and a fever and subsequently returned for reevaluation.  They deny any other symptoms, including cough, chest pain, shortness of breath, abdominal pain, burning or pain with urination, blood in urine or stool.  In the ED initial vital signs notable for fever to 103.3 Fahrenheit, tachycardia to the 130s improved to the 1 teens, tachypnea to the 20s, and normal blood pressure in the 120s over 50s to 60s.  O2 saturation on arrival in the low 90s and patient placed on 2 L nasal cannula.  Lab work-up notable for CMP showing sodium mildly low at 132, glucose 232, creatinine mildly elevated at 1.25, remainder of CMP unremarkable.  Initial CBC showed leukopenia with white count of 3.4 which is new and differential showing normal neutrophils but absent lymphocytes, as well as hemoglobin of 10.3 slightly down from baseline of 11-12, and thrombocytopenia at 68.  EKG showed sinus tachycardia but  no signs of ischemia.  Blood cultures were obtained.  Chest x-ray showed nodular opacity overlying the left lung base.  A CTPA was obtained which showed no evidence of PE, bibasilar atelectasis, and minimal increase in the ascending thoracic aortic aneurysm from 4.5 to 4.7 cm.  He was started on the sepsis protocol and ordered for IV fluid resuscitation as well as cefepime, vancomycin, Flagyl.  He was admitted for further management of his severe sepsis.  Review of Systems:  Pertinent positives and negative per HPI, all others reviewed and negative  Past Medical History:  Diagnosis Date   Abnormal findings on cardiac catheterization 09/30/15   [facility]   Abnormal myocardial perfusion study 09/24/15   [place]   CAD, multiple vessel 09/30/15   per CATH @ [facility]/DR.PARACHOS   Coronary artery disease    GERD (gastroesophageal reflux disease)    Gout    H/O echocardiogram 09/24/15   [facility]   Hard of hearing    History of pneumonia    Hyperlipidemia    Hypertension    Pneumonia    Progressive angina ([place])    Tonsil cancer ([place])    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   Past Surgical History:  Procedure Laterality Date   CARDIAC CATHETERIZATION N/A 09/30/2015   Procedure: Left Heart Cath and Coronary Angiography;  Surgeon: [name], MD;  Location: [place] [facility];  Service: Cardiovascular;  Laterality: N/A;   CORONARY ARTERY BYPASS GRAFT N/A 10/08/2015   Procedure: CORONARY ARTERY BYPASS GRAFTING time 4 using left internal mammary and right greater saphenous vein harvested by endovein;  Surgeon: Lilia Argue  Servando Snare, MD;  Location: [place];  Service: Open Heart Surgery;  Laterality: N/A;   CORONARY ARTERY BYPASS GRAFT     IR GASTROSTOMY TUBE MOD SED  02/10/2020   IR IMAGING GUIDED PORT INSERTION  02/10/2020   MULTIPLE EXTRACTIONS WITH ALVEOLOPLASTY N/A 02/04/2020   Procedure: Extraction of tooth #'s [phone], and 32 with alveoloplasty and gross  debridement of remaining teeth.;  Surgeon: [name], DDS;  Location: [place];  Service: Oral Surgery;  Laterality: N/A;   TEE WITHOUT CARDIOVERSION N/A 10/08/2015   Procedure: TRANSESOPHAGEAL ECHOCARDIOGRAM (TEE);  Surgeon: [name], MD;  Location: [place];  Service: Open Heart Surgery;  Laterality: N/A;   VASECTOMY     WISDOM TOOTH EXTRACTION     Social History:  reports that he has never smoked. He has never used smokeless tobacco. He reports that he does not drink alcohol and does not use drugs.  No Known Allergies  Family History  Problem Relation Age of Onset   Heart disease Mother    Hypertension Mother    Alcohol abuse Father    Throat cancer Father    Muscular dystrophy Brother    Muscular dystrophy Brother      Prior to Admission medications   Medication Sig Start Date End Date Taking? Authorizing Provider  allopurinol (ZYLOPRIM) 100 MG tablet Take 1 tablet (100 mg total) by mouth daily. 07/22/19  Yes Birdie Sons, MD  atorvastatin (LIPITOR) 20 MG tablet Take 1 tablet (20 mg total) by mouth daily. 09/25/19  Yes Chrismon, Vickki Muff, PA  dexamethasone (DECADRON) 4 MG tablet Take 2 tablets (8 mg total) by mouth daily. Take daily for 3 days after chemo. Take with food. 02/02/20  Yes [name], MD  lidocaine (XYLOCAINE) 2 % solution Patient: Mix 1part 2% viscous lidocaine, 1part H20. Swish & swallow 65mL of diluted mixture, 71min before eating and at bedtime, up to 5xs daily. 03/01/20  Yes [name], MD  lidocaine-prilocaine (EMLA) cream Apply to affected area once 02/02/20  Yes [name], MD  LORazepam (ATIVAN) 0.5 MG tablet Take 1 tablet (0.5 mg total) by mouth at bedtime as needed (Nausea or vomiting). 02/02/20  Yes [name], MD  magic mouthwash w/lidocaine SOLN Take 5 mLs by mouth 3 (three) times daily as needed for mouth pain. 03/01/20  Yes [name], MD  metoprolol succinate (TOPROL XL) 25 MG 24 hr tablet Take 1 tablet (25 mg total) by mouth  daily. 02/17/20  Yes [name], MD  Nutritional Supplements (KATE FARMS STANDARD 1.4) LIQD 488 mLs by Gastrostomy Tube route 4 (four) times daily. 02/25/20  Yes [name], MD  nystatin (MYCOSTATIN) 100000 UNIT/ML suspension Take 5 mLs (500,000 Units total) by mouth in the morning, at noon, and at bedtime. 03/02/20  Yes [name], MD  ondansetron (ZOFRAN) 8 MG tablet Take 1 tablet (8 mg total) by mouth 2 (two) times daily as needed. Start on the third day after chemotherapy. 02/02/20  Yes [name], MD  oxyCODONE-acetaminophen (PERCOCET/ROXICET) 5-325 MG tablet Take 1 tablet by mouth every 4 (four) hours as needed.  01/14/20  Yes [provider]  prochlorperazine (COMPAZINE) 10 MG tablet Take 1 tablet (10 mg total) by mouth every 6 (six) hours as needed (Nausea or vomiting). 02/02/20  Yes [name], MD  sodium fluoride (PREVIDENT 5000 PLUS) 1.1 % CREA dental cream Apply to tooth brush. Brush teeth for 2 minutes. Spit out excess-DO NOT swallow. DO NOT rinse afterwards. Repeat nightly. 01/28/20  Yes [name], DDS  acetaminophen (TYLENOL) 650 MG CR tablet Take 650 mg by mouth daily.  Patient not taking: Reported on 02/27/2020    [provider]  colchicine 0.6 MG tablet Take 1 tablet (0.6 mg total) by mouth daily. Patient not taking: Reported on 03/02/2020 02/17/20   [name], MD   Physical Exam: Vitals:   03/02/20 2208 03/02/20 2258 03/02/20 2259  BP: (!) 120/52 124/61   Pulse: (!) 132 (!) 117 (!) 115  Resp: (!) 22 (!) 24 (!) 26  Temp: (!) 103.3 F (39.6 C)    TempSrc: Oral    SpO2: 92% (!) 89% 90%  Weight: 98 kg      Wt Readings from Last 3 Encounters:  03/02/20 98 kg  03/02/20 97.9 kg  02/27/20 95.7 kg      General:  Appears calm and comfortable  Eyes: PERRL, normal lids, irises & conjunctiva  ENT: scattered white patches on mucous membranes  Neck: no masses appreciated on visual inspection  Cardiovascular: RRR, no m/r/g. No LE  edema.  Telemetry: SR, no arrhythmias   Respiratory: CTA bilaterally, no w/r/r. Normal respiratory effort.  Abdomen: soft, ntnd  Skin: erythema of neck and face  Musculoskeletal: grossly normal tone BUE/BLE  Psychiatric: grossly normal mood and affect, speech garbled but intelligible  Neurologic: grossly non-focal.          Labs on Admission:  Basic Metabolic Panel: Recent Labs  Lab 02/25/20 0736 03/02/20 0853 03/02/20 2220  NA 133* 135 132*  K 3.4* 4.4 4.3  CL 95* 99 98  CO2 27 28 23   GLUCOSE 172* 167* 232*  BUN 36* 20 22  CREATININE 1.82* 1.23 1.25*  CALCIUM 10.0 10.0 8.6*  MG 1.6* 1.4* 1.7   Liver Function Tests: Recent Labs  Lab 03/02/20 2220  AST 31  ALT 26  ALKPHOS 90  BILITOT 0.7  PROT 6.5  ALBUMIN 3.4*   No results for input(s): LIPASE, AMYLASE in the last 168 hours. No results for input(s): AMMONIA in the last 168 hours. CBC: Recent Labs  Lab 02/25/20 0736 03/02/20 0853  WBC 9.4 5.7  NEUTROABS 7.8* 4.8  HGB 12.8* 10.3*  HCT 37.9* 31.7*  MCV 80.8 83.6  PLT 191 78*   Cardiac Enzymes: No results for input(s): CKTOTAL, CKMB, CKMBINDEX, TROPONINI in the last 168 hours.  BNP (last 3 results) No results for input(s): BNP in the last 8760 hours.  ProBNP (last 3 results) No results for input(s): PROBNP in the last 8760 hours.  CBG: No results for input(s): GLUCAP in the last 168 hours.  Radiological Exams on Admission: DG Chest Port 1 View  Result Date: 03/02/2020 CLINICAL DATA:  Fever. EXAM: PORTABLE CHEST 1 VIEW COMPARISON:  Nov 15, 2015 FINDINGS: There is a right-sided venous Port-A-Cath with its distal tip noted at the junction of the superior vena cava and right atrium. Multiple sternal wires are present. A 1.7 cm x 1.1 cm nodular opacity is seen overlying the lateral aspect of the left lung base. This represents a new finding when compared to the prior exam. Very mild left basilar atelectasis is also noted. There is no evidence of a  pleural effusion or pneumothorax. The heart size and mediastinal contours are within normal limits. The visualized skeletal structures are unremarkable. IMPRESSION: 1. 1.7 cm x 1.1 cm nodular opacity overlying the left lung base. Correlation with chest CT is recommended to exclude the presence of an underlying lung mass. 2. Very mild left basilar  atelectasis. 3. Evidence of prior median sternotomy. Electronically Signed   By: Virgina Norfolk M.D.   On: 03/02/2020 22:53    EKG: Independently reviewed.  Sinus tachycardia without ischemic changes.  Nonspecific conduction delay.  No change compared to prior from earlier this month.  Assessment/Plan Active Problems:   * No active hospital problems. *  #Severe sepsis Patient meets criteria for severe sepsis with tachycardia, tachypnea, hypoxemia, leukopenia, lactic acid of 4.  Immunosuppressed  in setting of chemo and radiation.  Unclear source, possibly bacteremic.  Will treat per protocol. -IV fluid resuscitation  -Continue cefepime, vancomycin, Flagyl -Trend lactate -Follow-up blood cultures -Follow-up Covid test  #Thrombocytopenia #Leukopenia/lymphopenia Possibly secondary to chemotherapy, may also be related to sepsis.  Coags are normal, low suspicion for DIC. -SCDs -Trend CBC  #Thrush Exam consistent with oral candidiasis. -Nystatin -Declines topical p.o. pain meds  #AKI Baseline appears to be closer to 1, likely prerenal in the setting of sepsis, receiving fluid resuscitation.  If not responsive to fluids and renal function worsens/thrombocytopenia worsens would initiate work-up for TMA syndrome. -Trend CMP  #Elevated troponin Patient asymptomatic and EKG shows only sinus tachycardia without ischemic changes, although patient does have history of CAD and is status post CABG.  Likely demand in setting of sepsis given tachycardia and hypoxemia. -Trend troponin  #Nutrition Currently receiving nutrition via PEG tube. -Nutrition  consult in a.m.  #Chronic Medical Problems Gout: Continue allopurinol Hyperlipidemia: Continue atorvastatin Hypertension: Continue metoprolol  Code Status: Full Code, confirmed DVT Prophylaxis: SCDs Family Communication: Daughter [name] updated on plan at bedside Disposition Plan: Inpatient, Step-down   Time spent: 50 min  Clarnce Flock MD/[facility]

## 2020-03-02 NOTE — Progress Notes (Signed)
Nutrition follow-up completed with patient and daughter. Patient reports his throat is more sore than it was last week. Daughter states patient refuses to eat at times. He is receiving concurrent chemoradiation therapy for tonsil cancer.  Patient has completed 10 out of 35 radiation treatments. Weight improved and documented as 217.8 pounds on August 30 Noted labs: Glucose 167 admitting and magnesium 1.4.  Estimated nutrition needs: 2400-2700 cal, 115-130 g protein, 2.6 L fluid.  Nutrition diagnosis: Unintended weight loss improved.  Intervention: Continue [employer] 1.4 via G-tube, 1 carton 4 times daily 4 hours apart with 60 mL of free water before and after bolus feedings. Give 240 mL of free water 4 times daily between bolus feeding. Continue oral intake as tolerated. If patient can no longer consume food by mouth, increase [employer] 1.4 to goal rate of 1-1/2 cartons 4 times daily to provide 2730 cal, 120 g protein and 1404 mL free water.   Total free water intake with water flushes is 2844 mL. Questions were answered.  Teach back method used.  Monitoring, evaluation, goals: Patient will continue to tolerate oral intake with tube feeding to minimize weight loss.  Next visit: Tuesday, September 7.  **Disclaimer: This note was dictated with voice recognition software. Similar sounding words can inadvertently be transcribed and this note may contain transcription errors which may not have been corrected upon publication of note.**

## 2020-03-02 NOTE — Progress Notes (Signed)
CRITICAL VALUE STICKER  CRITICAL VALUE:mag 1.4  RECEIVER (on-site recipient of call):Eun Vermeer  DATE & TIME NOTIFIED: 03/02/20 0940  MESSENGER (representative from lab): jen MD NOTIFIED: Merleen Nicely for [place]  RESPONSE: message received thank you

## 2020-03-02 NOTE — ED Provider Notes (Addendum)
[facility] Emergency Department Provider Note  ____________________________________________   First MD Initiated Contact with Patient 03/02/20 2220     (approximate)  I have reviewed the triage vital signs and the nursing notes.   HISTORY  Chief Complaint Code Sepsis   HPI Omar Christian is a 64 y.o. male with a past medical history of CAD, HTN, HDL, GERD, gout, and tonsil cancer currently undergoing chemotherapy who presents after developing a fever earlier today after receiving chemotherapy this morning associated with cough and chills.  Patient denies any headache, earache, sore throat, hemoptysis, chest pain, dental pain, vomiting, diarrhea, rash, burning with urination, or other acute physical symptoms.  No clear alleviating aggravating factors.  No prior similar episodes.  No other acute concerns at this time.         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    Patient Active Problem List   Diagnosis Date Noted   Severe sepsis ([place]) 03/02/2020   Cancer of tonsil ([place]) 02/04/2020   Poor dentition 02/04/2020   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[place]) 01/31/2020   Tonsillar cancer ([place]) 01/27/2020   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   Unstable angina pectoris ([place]) 10/04/2015   Progressive angina ([facility])    Abnormal findings on cardiac catheterization 09/30/2015   CAD, multiple vessel 09/30/2015   Abnormal findings diagnostic  imaging of heart and coronary circulation 09/30/2015   CAD in native artery 09/30/2015   H/O echocardiogram 09/24/2015   Abnormal myocardial perfusion study 09/24/2015   Chest pain 09/03/2015   Allergic rhinitis 08/27/2015   H/O: gout 08/27/2015   HLD (hyperlipidemia) 08/27/2015   Benign hypertension 08/27/2015   Acid reflux 08/27/2015   Essential (primary) hypertension 08/27/2015   H/O disease 08/27/2015    Past Surgical History:  Procedure Laterality Date   CARDIAC CATHETERIZATION N/A 09/30/2015   Procedure: Left Heart Cath and Coronary Angiography;  Surgeon: [name], MD;  Location: [place] [facility];  Service: Cardiovascular;  Laterality: N/A;   CORONARY ARTERY BYPASS GRAFT N/A 10/08/2015   Procedure: CORONARY ARTERY BYPASS GRAFTING time 4 using left internal mammary and right greater saphenous vein harvested by endovein;  Surgeon: [name], MD;  Location: [place];  Service: Open Heart Surgery;  Laterality: N/A;   CORONARY ARTERY BYPASS GRAFT     IR GASTROSTOMY TUBE MOD SED  02/10/2020   IR IMAGING GUIDED PORT INSERTION  02/10/2020   MULTIPLE EXTRACTIONS WITH ALVEOLOPLASTY N/A 02/04/2020   Procedure: Extraction of tooth #'s [phone], and 32 with alveoloplasty and gross debridement of remaining teeth.;  Surgeon: [name], DDS;  Location: [place];  Service: Oral Surgery;  Laterality: N/A;   TEE WITHOUT CARDIOVERSION N/A 10/08/2015   Procedure: TRANSESOPHAGEAL ECHOCARDIOGRAM (TEE);  Surgeon: [name], MD;  Location: [place];  Service: Open Heart Surgery;  Laterality: N/A;   VASECTOMY  WISDOM TOOTH EXTRACTION      Prior to Admission medications   Medication Sig Start Date End Date Taking? Authorizing Provider  allopurinol (ZYLOPRIM) 100 MG tablet Take 1 tablet (100 mg total) by mouth daily. 07/22/19  Yes Birdie Sons, MD  atorvastatin (LIPITOR) 20 MG tablet Take 1 tablet (20 mg total) by mouth daily. 09/25/19  Yes Chrismon,  Vickki Muff, PA  dexamethasone (DECADRON) 4 MG tablet Take 2 tablets (8 mg total) by mouth daily. Take daily for 3 days after chemo. Take with food. 02/02/20  Yes [name], MD  lidocaine (XYLOCAINE) 2 % solution Patient: Mix 1part 2% viscous lidocaine, 1part H20. Swish & swallow 17mL of diluted mixture, 36min before eating and at bedtime, up to 5xs daily. 03/01/20  Yes [name], MD  lidocaine-prilocaine (EMLA) cream Apply to affected area once 02/02/20  Yes [name], MD  LORazepam (ATIVAN) 0.5 MG tablet Take 1 tablet (0.5 mg total) by mouth at bedtime as needed (Nausea or vomiting). 02/02/20  Yes [name], MD  magic mouthwash w/lidocaine SOLN Take 5 mLs by mouth 3 (three) times daily as needed for mouth pain. 03/01/20  Yes [name], MD  metoprolol succinate (TOPROL XL) 25 MG 24 hr tablet Take 1 tablet (25 mg total) by mouth daily. 02/17/20  Yes [name], MD  Nutritional Supplements (KATE FARMS STANDARD 1.4) LIQD 488 mLs by Gastrostomy Tube route 4 (four) times daily. 02/25/20  Yes [name], MD  nystatin (MYCOSTATIN) 100000 UNIT/ML suspension Take 5 mLs (500,000 Units total) by mouth in the morning, at noon, and at bedtime. 03/02/20  Yes [name], MD  ondansetron (ZOFRAN) 8 MG tablet Take 1 tablet (8 mg total) by mouth 2 (two) times daily as needed. Start on the third day after chemotherapy. 02/02/20  Yes [name], MD  oxyCODONE-acetaminophen (PERCOCET/ROXICET) 5-325 MG tablet Take 1 tablet by mouth every 4 (four) hours as needed.  01/14/20  Yes [provider]  prochlorperazine (COMPAZINE) 10 MG tablet Take 1 tablet (10 mg total) by mouth every 6 (six) hours as needed (Nausea or vomiting). 02/02/20  Yes [name], MD  sodium fluoride (PREVIDENT 5000 PLUS) 1.1 % CREA dental cream Apply to tooth brush. Brush teeth for 2 minutes. Spit out excess-DO NOT swallow. DO NOT rinse afterwards. Repeat nightly. 01/28/20  Yes [name], DDS  acetaminophen (TYLENOL) 650 MG  CR tablet Take 650 mg by mouth daily.  Patient not taking: Reported on 02/27/2020    [provider]  colchicine 0.6 MG tablet Take 1 tablet (0.6 mg total) by mouth daily. Patient not taking: Reported on 03/02/2020 02/17/20   [name], MD    Allergies Patient has no known allergies.  Family History  Problem Relation Age of Onset   Heart disease Mother    Hypertension Mother    Alcohol abuse Father    Throat cancer Father    Muscular dystrophy Brother    Muscular dystrophy Brother     Social History Social History   Tobacco Use   Smoking status: Never Smoker   Smokeless tobacco: Never Used  Scientific laboratory technician Use: Never used  Substance Use Topics   Alcohol use: No   Drug use: No    Review of Systems  Review of Systems  Constitutional: Positive for chills, fever and malaise/fatigue.  HENT: Negative for sore throat.   Eyes: Negative for pain.  Respiratory: Positive for cough. Negative for stridor.   Cardiovascular: Negative for chest  pain.  Gastrointestinal: Negative for vomiting.  Skin: Negative for rash.  Neurological: Negative for seizures, loss of consciousness and headaches.  Psychiatric/Behavioral: Negative for suicidal ideas.  All other systems reviewed and are negative.     ____________________________________________   PHYSICAL EXAM:  VITAL SIGNS: ED Triage Vitals [03/02/20 2208]  Enc Vitals Group     BP (!) 120/52     Pulse Rate (!) 132     Resp (!) 22     Temp (!) 103.3 F (39.6 C)     Temp Source Oral     SpO2 92 %     Weight 216 lb (98 kg)     Height      Head Circumference      Peak Flow      Pain Score      Pain Loc      Pain Edu?      Excl. in [place]?    Vitals:   03/02/20 2258 03/02/20 2259  BP: 124/61   Pulse: (!) 117 (!) 115  Resp: (!) 24 (!) 26  Temp:    SpO2: (!) 89% 90%   Physical Exam Vitals and nursing note reviewed.  Constitutional:      Appearance: He is well-developed.  HENT:     Head:  Normocephalic and atraumatic.     Right Ear: External ear normal.     Left Ear: External ear normal.     Nose: Nose normal.     Mouth/Throat:     Mouth: Mucous membranes are moist.  Eyes:     Conjunctiva/sclera: Conjunctivae normal.  Cardiovascular:     Rate and Rhythm: Regular rhythm. Tachycardia present.     Pulses: Normal pulses.     Heart sounds: No murmur heard.   Pulmonary:     Effort: Pulmonary effort is normal. No respiratory distress.     Breath sounds: Normal breath sounds.  Abdominal:     Palpations: Abdomen is soft.     Tenderness: There is no abdominal tenderness.  Musculoskeletal:     Cervical back: Neck supple.     Right lower leg: No edema.     Left lower leg: No edema.  Skin:    General: Skin is warm and dry.     Capillary Refill: Capillary refill takes 2 to 3 seconds.  Neurological:     Mental Status: He is alert and oriented to person, place, and time.  Psychiatric:        Mood and Affect: Mood normal.      ____________________________________________   LABS (all labs ordered are listed, but only abnormal results are displayed)  Labs Reviewed  LACTIC ACID, PLASMA - Abnormal; Notable for the following components:      Result Value   Lactic Acid, Venous 4.0 (*)    All other components within normal limits  COMPREHENSIVE METABOLIC PANEL - Abnormal; Notable for the following components:   Sodium 132 (*)    Glucose, Bld 232 (*)    Creatinine, Ser 1.25 (*)    Calcium 8.6 (*)    Albumin 3.4 (*)    All other components within normal limits  CBC WITH DIFFERENTIAL/PLATELET - Abnormal; Notable for the following components:   WBC 3.4 (*)    RBC 3.80 (*)    Hemoglobin 10.3 (*)    HCT 32.0 (*)    Platelets 68 (*)    Lymphs Abs 0.0 (*)    All other components within normal limits  TROPONIN I (HIGH SENSITIVITY) -  Abnormal; Notable for the following components:   Troponin I (High Sensitivity) 24 (*)    All other components within normal limits  CULTURE,  BLOOD (ROUTINE X 2)  CULTURE, BLOOD (ROUTINE X 2)  URINE CULTURE  SARS CORONAVIRUS 2 BY RT PCR (HOSPITAL ORDER, PERFORMED IN [facility])  PROTIME-INR  MAGNESIUM  LACTIC ACID, PLASMA  URINALYSIS, COMPLETE (UACMP) WITH MICROSCOPIC  APTT   ____________________________________________  EKG  Sinus tachycardia with a ventricular rate of 129, normal axis, unremarkable intervals, no clear evidence of acute ischemia or other significant underlying arrhythmia. ____________________________________________  RADIOLOGY  ED MD interpretation: No clear focal consolidation, significant edema, or large effusion.  There is a nodule in the left lower lobe.  Official radiology report(s): DG Chest Port 1 View  Result Date: 03/02/2020 CLINICAL DATA:  Fever. EXAM: PORTABLE CHEST 1 VIEW COMPARISON:  Nov 15, 2015 FINDINGS: There is a right-sided venous Port-A-Cath with its distal tip noted at the junction of the superior vena cava and right atrium. Multiple sternal wires are present. A 1.7 cm x 1.1 cm nodular opacity is seen overlying the lateral aspect of the left lung base. This represents a new finding when compared to the prior exam. Very mild left basilar atelectasis is also noted. There is no evidence of a pleural effusion or pneumothorax. The heart size and mediastinal contours are within normal limits. The visualized skeletal structures are unremarkable. IMPRESSION: 1. 1.7 cm x 1.1 cm nodular opacity overlying the left lung base. Correlation with chest CT is recommended to exclude the presence of an underlying lung mass. 2. Very mild left basilar atelectasis. 3. Evidence of prior median sternotomy. Electronically Signed   By: Virgina Norfolk M.D.   On: 03/02/2020 22:53    ____________________________________________   PROCEDURES  Procedure(s) performed (including Critical Care):  .1-3 Lead EKG Interpretation Performed by: Lucrezia Starch, MD Authorized by: Lucrezia Starch, MD      Interpretation: abnormal     ECG rate assessment: tachycardic     Rhythm: sinus rhythm     Ectopy: none     Conduction: normal   .Critical Care Performed by: Lucrezia Starch, MD Authorized by: Lucrezia Starch, MD   Critical care provider statement:    Critical care time (minutes):  65   Critical care time was exclusive of:  Separately billable procedures and treating other patients   Critical care was necessary to treat or prevent imminent or life-threatening deterioration of the following conditions:  Sepsis and shock   Critical care was time spent personally by me on the following activities:  Discussions with consultants, evaluation of patient's response to treatment, examination of patient, ordering and performing treatments and interventions, ordering and review of laboratory studies, ordering and review of radiographic studies, pulse oximetry, re-evaluation of patient's condition, obtaining history from patient or surrogate and review of old charts     ____________________________________________   INITIAL IMPRESSION / [place] / ED COURSE        Patient presents with [place] to history exam after developing a fever this evening associated with chills and cough after undergoing chemotherapy for tonsillar cancer earlier this morning.  On arrival patient is tachycardic with a rate of 132, febrile to 103.3, tachypneic to 22 with an SPO2 of 90% on room air and a BP of 120/52.  Exam as above.  Will place patient on nasal cannula for respiratory failure.  Given SIRS criteria and concern for sepsis code sepsis immediately initiated  in triage.  Cultures obtained and patient given IV fluid resuscitation broad-spectrum antibiotics initiated.  CMP does show some mild hyperglycemia with no evidence of acidosis.  Sliding scale insulin ordered.  Initial lactic acid is 4.  Troponin is slightly elevated 24.  This is likely demand ischemia although we will plan to obtain a second troponin.   Will defer anticoagulation or antiplatelet therapy at this time.  Covid PCR sent.  We will plan to obtain CTA chest to assess lung nodule and for evidence of PE.  We will plan to admit to medicine service for further evaluation management.  ____________________________________________   FINAL CLINICAL IMPRESSION(S) / ED DIAGNOSES  Final diagnoses:  Sepsis, due to unspecified organism, unspecified whether acute organ dysfunction present ([place])  Hyperglycemia  Patient on antineoplastic chemotherapy regimen  Lactic acid acidosis  Fever, unspecified fever cause  Tachycardia  Troponin I above reference range  Acute respiratory failure with hypoxia ([facility])  Thrombocytopenia ([facility])    Medications  lactated ringers bolus 1,000 mL (has no administration in time range)    And  lactated ringers bolus 1,000 mL (has no administration in time range)    And  lactated ringers bolus 1,000 mL (has no administration in time range)  vancomycin (VANCOCIN) IVPB 1000 mg/200 mL premix (1,000 mg Intravenous New Bag/Given 03/02/20 2314)  insulin aspart (novoLOG) injection 0-15 Units (has no administration in time range)  acetaminophen (TYLENOL) tablet 1,000 mg (1,000 mg Oral Given 03/02/20 2253)  ceFEPIme (MAXIPIME) 2 g in sodium chloride 0.9 % 100 mL IVPB (0 g Intravenous Stopped 03/02/20 2310)  metroNIDAZOLE (FLAGYL) IVPB 500 mg (0 mg Intravenous Stopped 03/02/20 2311)     ED Discharge Orders    None       Note:  This document was prepared using Dragon voice recognition software and may include unintentional dictation errors.   Lucrezia Starch, MD 03/02/20 2324    Lucrezia Starch, MD 03/02/20 [phone]

## 2020-03-02 NOTE — Assessment & Plan Note (Signed)
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 Two right cervical lymph nodesmetastases measuring 2.5 cm and 2.1 cm [id number] stage IVa HPV status:Positive  Treatment plan:Concurrent chemoradiation with cisplatin every 3 weeks x4. Today iscycle 1 day 15 Patient gets IV fluids today HTN H/O gout: treated with colchicine.  Chemo toxicities: 1.  Weight loss:  on PEG tube feeds. 2. Fatigue 3.  Worsening hearing impairment patient understands the risks of continuing cisplatin and wants to push forward. 4.  Nausea   RTC in 1 week to see me with cycle 2.

## 2020-03-02 NOTE — Progress Notes (Unsigned)
CRITICAL VALUE ALERT  Critical Value:  Mag 1.4  Date & Time Notied:  8/31/21at 0945am  Provider Notified: [name], MD  Orders Received/Actions taken: Per MD okay to treat.  Pt will be receiving IV mag with pre/post cisplatin fluids.

## 2020-03-02 NOTE — Patient Instructions (Signed)
[facility] Discharge Instructions for Patients Receiving Chemotherapy  Today you received the following chemotherapy agents: Cisplatin  To help prevent nausea and vomiting after your treatment, we encourage you to take your nausea medication. Don't take Zofran for the first three days. After three days, you may take the Zofran again. If you have nausea before the three days is up, please take your compazine nausea medication as directed on bottle.    If you develop nausea and vomiting that is not controlled by your nausea medication, call the clinic.   BELOW ARE SYMPTOMS THAT SHOULD BE REPORTED IMMEDIATELY:  *FEVER GREATER THAN 100.5 F  *CHILLS WITH OR WITHOUT FEVER  NAUSEA AND VOMITING THAT IS NOT CONTROLLED WITH YOUR NAUSEA MEDICATION  *UNUSUAL SHORTNESS OF BREATH  *UNUSUAL BRUISING OR BLEEDING  TENDERNESS IN MOUTH AND THROAT WITH OR WITHOUT PRESENCE OF ULCERS  *URINARY PROBLEMS  *BOWEL PROBLEMS  UNUSUAL RASH Items with * indicate a potential emergency and should be followed up as soon as possible.  Feel free to call the clinic should you have any questions or concerns. The clinic phone number is [phone].  Please show the [place] at check-in to the Emergency Department and triage nurse.  Cisplatin injection What is this medicine? CISPLATIN (SIS pla tin) is a chemotherapy drug. It targets fast dividing cells, like cancer cells, and causes these cells to die. This medicine is used to treat many types of cancer like bladder, ovarian, and testicular cancers. This medicine may be used for other purposes; ask your health care provider or pharmacist if you have questions. COMMON BRAND NAME(S): Platinol, Platinol -AQ What should I tell my health care provider before I take this medicine? They need to know if you have any of these conditions:  eye disease, vision problems  hearing problems  kidney disease  low blood counts, like white cells,  platelets, or red blood cells  tingling of the fingers or toes, or other nerve disorder  an unusual or allergic reaction to cisplatin, carboplatin, oxaliplatin, other medicines, foods, dyes, or preservatives  pregnant or trying to get pregnant  breast-feeding How should I use this medicine? This drug is given as an infusion into a vein. It is administered in a hospital or clinic by a specially trained health care professional. Talk to your pediatrician regarding the use of this medicine in children. Special care may be needed. Overdosage: If you think you have taken too much of this medicine contact a poison control center or emergency room at once. NOTE: This medicine is only for you. Do not share this medicine with others. What if I miss a dose? It is important not to miss a dose. Call your doctor or health care professional if you are unable to keep an appointment. What may interact with this medicine? This medicine may interact with the following medications:  foscarnet  certain antibiotics like amikacin, gentamicin, neomycin, polymyxin B, streptomycin, tobramycin, vancomycin This list may not describe all possible interactions. Give your health care provider a list of all the medicines, herbs, non-prescription drugs, or dietary supplements you use. Also tell them if you smoke, drink alcohol, or use illegal drugs. Some items may interact with your medicine. What should I watch for while using this medicine? Your condition will be monitored carefully while you are receiving this medicine. You will need important blood work done while you are taking this medicine. This drug may make you feel generally unwell. This is not uncommon, as  chemotherapy can affect healthy cells as well as cancer cells. Report any side effects. Continue your course of treatment even though you feel ill unless your doctor tells you to stop. This medicine may increase your risk of getting an infection. Call your  healthcare professional for advice if you get a fever, chills, or sore throat, or other symptoms of a cold or flu. Do not treat yourself. Try to avoid being around people who are sick. Avoid taking medicines that contain aspirin, acetaminophen, ibuprofen, naproxen, or ketoprofen unless instructed by your healthcare professional. These medicines may hide a fever. This medicine may increase your risk to bruise or bleed. Call your doctor or health care professional if you notice any unusual bleeding. Be careful brushing and flossing your teeth or using a toothpick because you may get an infection or bleed more easily. If you have any dental work done, tell your dentist you are receiving this medicine. Do not become pregnant while taking this medicine or for 14 months after stopping it. Women should inform their healthcare professional if they wish to become pregnant or think they might be pregnant. Men should not father a child while taking this medicine and for 11 months after stopping it. There is potential for serious side effects to an unborn child. Talk to your healthcare professional for more information. Do not breast-feed an infant while taking this medicine. This medicine has caused ovarian failure in some women. This medicine may make it more difficult to get pregnant. Talk to your healthcare professional if you are concerned about your fertility. This medicine has caused decreased sperm counts in some men. This may make it more difficult to father a child. Talk to your healthcare professional if you are concerned about your fertility. Drink fluids as directed while you are taking this medicine. This will help protect your kidneys. Call your doctor or health care professional if you get diarrhea. Do not treat yourself. What side effects may I notice from receiving this medicine? Side effects that you should report to your doctor or health care professional as soon as possible:  allergic reactions  like skin rash, itching or hives, swelling of the face, lips, or tongue  blurred vision  changes in vision  decreased hearing or ringing of the ears  nausea, vomiting  pain, redness, or irritation at site where injected  pain, tingling, numbness in the hands or feet  signs and symptoms of bleeding such as bloody or black, tarry stools; red or dark brown urine; spitting up blood or brown material that looks like coffee grounds; red spots on the skin; unusual bruising or bleeding from the eyes, gums, or nose  signs and symptoms of infection like fever; chills; cough; sore throat; pain or trouble passing urine  signs and symptoms of kidney injury like trouble passing urine or change in the amount of urine  signs and symptoms of low red blood cells or anemia such as unusually weak or tired; feeling faint or lightheaded; falls; breathing problems Side effects that usually do not require medical attention (report to your doctor or health care professional if they continue or are bothersome):  loss of appetite  mouth sores  muscle cramps This list may not describe all possible side effects. Call your doctor for medical advice about side effects. You may report side effects to FDA at 1-800-FDA-1088. Where should I keep my medicine? This drug is given in a hospital or clinic and will not be stored at home. NOTE: This sheet  is a summary. It may not cover all possible information. If you have questions about this medicine, talk to your doctor, pharmacist, or health care provider.  2020 Elsevier/Gold Standard (2018-06-14 15:59:17)

## 2020-03-02 NOTE — ED Triage Notes (Addendum)
Pt is receiving chemo for cancer of the lymph nodes and developed a fever today. Pt is very hard of hearing, pt is unsure if he took an antipyretic today.

## 2020-03-02 NOTE — Progress Notes (Signed)
Per Dr. Lindi Adie, ok to proceed with 175 cc's urinary output pre treatment.

## 2020-03-03 ENCOUNTER — Ambulatory Visit: Payer: 59

## 2020-03-03 ENCOUNTER — Telehealth: Payer: Self-pay | Admitting: Hematology and Oncology

## 2020-03-03 ENCOUNTER — Inpatient Hospital Stay: Payer: 59

## 2020-03-03 ENCOUNTER — Encounter: Payer: Self-pay | Admitting: Family Medicine

## 2020-03-03 DIAGNOSIS — D61810 Antineoplastic chemotherapy induced pancytopenia: Secondary | ICD-10-CM

## 2020-03-03 DIAGNOSIS — C099 Malignant neoplasm of tonsil, unspecified: Secondary | ICD-10-CM | POA: Insufficient documentation

## 2020-03-03 DIAGNOSIS — Z79899 Other long term (current) drug therapy: Secondary | ICD-10-CM

## 2020-03-03 DIAGNOSIS — N171 Acute kidney failure with acute cortical necrosis: Secondary | ICD-10-CM

## 2020-03-03 DIAGNOSIS — Z7189 Other specified counseling: Secondary | ICD-10-CM

## 2020-03-03 DIAGNOSIS — T451X5A Adverse effect of antineoplastic and immunosuppressive drugs, initial encounter: Secondary | ICD-10-CM

## 2020-03-03 DIAGNOSIS — Z515 Encounter for palliative care: Secondary | ICD-10-CM

## 2020-03-03 LAB — COMPREHENSIVE METABOLIC PANEL
ALT: 31 U/L (ref 0–44)
AST: 35 U/L (ref 15–41)
Albumin: 2.8 g/dL — ABNORMAL LOW (ref 3.5–5.0)
Alkaline Phosphatase: 61 U/L (ref 38–126)
Anion gap: 11 (ref 5–15)
BUN: 32 mg/dL — ABNORMAL HIGH (ref 8–23)
CO2: 21 mmol/L — ABNORMAL LOW (ref 22–32)
Calcium: 8 mg/dL — ABNORMAL LOW (ref 8.9–10.3)
Chloride: 100 mmol/L (ref 98–111)
Creatinine, Ser: 1.62 mg/dL — ABNORMAL HIGH (ref 0.61–1.24)
GFR calc Af Amer: 52 mL/min — ABNORMAL LOW (ref >60)
GFR calc non Af Amer: 45 mL/min — ABNORMAL LOW (ref >60)
Glucose, Bld: 254 mg/dL — ABNORMAL HIGH (ref 70–99)
Potassium: 4.4 mmol/L (ref 3.5–5.1)
Sodium: 132 mmol/L — ABNORMAL LOW (ref 135–145)
Total Bilirubin: 1.1 mg/dL (ref 0.3–1.2)
Total Protein: 5.3 g/dL — ABNORMAL LOW (ref 6.5–8.1)

## 2020-03-03 LAB — PROTIME-INR
INR: 1.2 (ref 0.8–1.2)
Prothrombin Time: 14.3 seconds (ref 11.4–15.2)

## 2020-03-03 LAB — URINALYSIS, COMPLETE (UACMP) WITH MICROSCOPIC
Bacteria, UA: NONE SEEN
Bilirubin Urine: NEGATIVE
Glucose, UA: NEGATIVE mg/dL
Hgb urine dipstick: NEGATIVE
Ketones, ur: NEGATIVE mg/dL
Leukocytes,Ua: NEGATIVE
Nitrite: NEGATIVE
Protein, ur: NEGATIVE mg/dL
Specific Gravity, Urine: 1.033 — ABNORMAL HIGH (ref 1.005–1.030)
Squamous Epithelial / HPF: NONE SEEN (ref 0–5)
pH: 5 (ref 5.0–8.0)

## 2020-03-03 LAB — TROPONIN I (HIGH SENSITIVITY)
Troponin I (High Sensitivity): 35 ng/L — ABNORMAL HIGH (ref <18)
Troponin I (High Sensitivity): 42 ng/L — ABNORMAL HIGH (ref <18)
Troponin I (High Sensitivity): 42 ng/L — ABNORMAL HIGH (ref <18)

## 2020-03-03 LAB — CBC
HCT: 27.4 % — ABNORMAL LOW (ref 39.0–52.0)
Hemoglobin: 9.1 g/dL — ABNORMAL LOW (ref 13.0–17.0)
MCH: 27.4 pg (ref 26.0–34.0)
MCHC: 33.2 g/dL (ref 30.0–36.0)
MCV: 82.5 fL (ref 80.0–100.0)
Platelets: 71 10*3/uL — ABNORMAL LOW (ref 150–400)
RBC: 3.32 MIL/uL — ABNORMAL LOW (ref 4.22–5.81)
RDW: 14.1 % (ref 11.5–15.5)
WBC: 4.2 10*3/uL (ref 4.0–10.5)
nRBC: 0 % (ref 0.0–0.2)

## 2020-03-03 LAB — LACTIC ACID, PLASMA
Lactic Acid, Venous: 3.8 mmol/L (ref 0.5–1.9)
Lactic Acid, Venous: 2.6 mmol/L (ref 0.5–1.9)
Lactic Acid, Venous: 2.8 mmol/L (ref 0.5–1.9)

## 2020-03-03 LAB — CORTISOL-AM, BLOOD: Cortisol - AM: 11.9 ug/dL (ref 6.7–22.6)

## 2020-03-03 LAB — SARS CORONAVIRUS 2 BY RT PCR (HOSPITAL ORDER, PERFORMED IN ~~LOC~~ HOSPITAL LAB): SARS Coronavirus 2: NEGATIVE

## 2020-03-03 LAB — GLUCOSE, CAPILLARY
Glucose-Capillary: 163 mg/dL — ABNORMAL HIGH (ref 70–99)
Glucose-Capillary: 178 mg/dL — ABNORMAL HIGH (ref 70–99)
Glucose-Capillary: 211 mg/dL — ABNORMAL HIGH (ref 70–99)

## 2020-03-03 LAB — PROCALCITONIN: Procalcitonin: 16 ng/mL

## 2020-03-03 MED ORDER — DIPHENHYDRAMINE HCL 50 MG/ML IJ SOLN
25.0000 mg | Freq: Once | INTRAMUSCULAR | Status: AC
  Administered 2020-03-03: 25 mg via INTRAVENOUS
  Filled 2020-03-03: qty 1

## 2020-03-03 MED ORDER — SODIUM CHLORIDE 0.9% FLUSH
3.0000 mL | Freq: Two times a day (BID) | INTRAVENOUS | Status: DC
  Administered 2020-03-03 – 2020-03-04 (×4): 3 mL via INTRAVENOUS

## 2020-03-03 MED ORDER — ONDANSETRON HCL 4 MG PO TABS: 4.0000 mg | ORAL_TABLET | Freq: Four times a day (QID) | ORAL | Status: DC | PRN

## 2020-03-03 MED ORDER — ACETAMINOPHEN 325 MG PO TABS
650.0000 mg | ORAL_TABLET | Freq: Four times a day (QID) | ORAL | Status: DC | PRN
  Administered 2020-03-03: 650 mg via ORAL
  Filled 2020-03-03: qty 2

## 2020-03-03 MED ORDER — METRONIDAZOLE IN NACL 5-0.79 MG/ML-% IV SOLN
500.0000 mg | Freq: Three times a day (TID) | INTRAVENOUS | Status: DC
  Administered 2020-03-03: 500 mg via INTRAVENOUS
  Filled 2020-03-03: qty 100

## 2020-03-03 MED ORDER — LIDOCAINE VISCOUS HCL 2 % MT SOLN
15.0000 mL | OROMUCOSAL | Status: DC | PRN
  Filled 2020-03-03: qty 15

## 2020-03-03 MED ORDER — SODIUM CHLORIDE 0.9 % IV SOLN
2.0000 g | Freq: Three times a day (TID) | INTRAVENOUS | Status: DC
  Administered 2020-03-03: 2 g via INTRAVENOUS
  Filled 2020-03-03: qty 2

## 2020-03-03 MED ORDER — METOPROLOL SUCCINATE ER 25 MG PO TB24
25.0000 mg | ORAL_TABLET | Freq: Every day | ORAL | Status: DC
  Administered 2020-03-03 – 2020-03-04 (×2): 25 mg via ORAL
  Filled 2020-03-03 (×2): qty 1

## 2020-03-03 MED ORDER — ALLOPURINOL 100 MG PO TABS
100.0000 mg | ORAL_TABLET | Freq: Every day | ORAL | Status: DC
  Administered 2020-03-03 – 2020-03-04 (×2): 100 mg via ORAL
  Filled 2020-03-03 (×2): qty 1

## 2020-03-03 MED ORDER — ENSURE ENLIVE PO LIQD
237.0000 mL | Freq: Three times a day (TID) | ORAL | Status: DC
  Administered 2020-03-04: 237 mL via ORAL

## 2020-03-03 MED ORDER — VANCOMYCIN HCL 1250 MG/250ML IV SOLN
1250.0000 mg | Freq: Two times a day (BID) | INTRAVENOUS | Status: DC
  Administered 2020-03-03: 1250 mg via INTRAVENOUS
  Filled 2020-03-03 (×2): qty 250

## 2020-03-03 MED ORDER — ATORVASTATIN CALCIUM 20 MG PO TABS
20.0000 mg | ORAL_TABLET | Freq: Every day | ORAL | Status: DC
  Administered 2020-03-03 – 2020-03-04 (×2): 20 mg via ORAL
  Filled 2020-03-03 (×2): qty 1

## 2020-03-03 MED ORDER — OXYCODONE HCL 5 MG PO TABS: 5.0000 mg | ORAL_TABLET | ORAL | Status: DC | PRN

## 2020-03-03 MED ORDER — ACETAMINOPHEN 650 MG RE SUPP: 650.0000 mg | Freq: Four times a day (QID) | RECTAL | Status: DC | PRN

## 2020-03-03 MED ORDER — TRAZODONE HCL 50 MG PO TABS: 50.0000 mg | ORAL_TABLET | Freq: Every evening | ORAL | Status: DC | PRN

## 2020-03-03 MED ORDER — POLYETHYLENE GLYCOL 3350 17 G PO PACK: 17.0000 g | PACK | Freq: Every day | ORAL | Status: DC | PRN

## 2020-03-03 MED ORDER — OSMOLITE 1.2 CAL PO LIQD: 1000.0000 mL | ORAL | Status: DC

## 2020-03-03 MED ORDER — VANCOMYCIN HCL 750 MG/150ML IV SOLN
750.0000 mg | Freq: Two times a day (BID) | INTRAVENOUS | Status: DC
  Administered 2020-03-04 (×2): 750 mg via INTRAVENOUS
  Filled 2020-03-03 (×4): qty 150

## 2020-03-03 MED ORDER — PIPERACILLIN-TAZOBACTAM 3.375 G IVPB
3.3750 g | Freq: Three times a day (TID) | INTRAVENOUS | Status: DC
  Administered 2020-03-03 – 2020-03-05 (×6): 3.375 g via INTRAVENOUS
  Filled 2020-03-03 (×6): qty 50

## 2020-03-03 MED ORDER — KATE FARMS STANDARD 1.4 PO LIQD
325.0000 mL | Freq: Four times a day (QID) | ORAL | Status: DC
  Administered 2020-03-03 – 2020-03-04 (×5): 325 mL
  Filled 2020-03-03: qty 325

## 2020-03-03 MED ORDER — IOHEXOL 350 MG/ML SOLN
75.0000 mL | Freq: Once | INTRAVENOUS | Status: AC | PRN
  Administered 2020-03-03: 75 mL via INTRAVENOUS

## 2020-03-03 MED ORDER — NYSTATIN 100000 UNIT/ML MT SUSP
5.0000 mL | Freq: Four times a day (QID) | OROMUCOSAL | Status: DC
  Administered 2020-03-03 – 2020-03-04 (×3): 500000 [IU] via ORAL
  Filled 2020-03-03 (×8): qty 5

## 2020-03-03 MED ORDER — FREE WATER
240.0000 mL | Freq: Four times a day (QID) | Status: DC
  Administered 2020-03-03 – 2020-03-05 (×4): 240 mL
  Filled 2020-03-03 (×8): qty 240

## 2020-03-03 MED ORDER — ONDANSETRON HCL 4 MG/2ML IJ SOLN
4.0000 mg | Freq: Four times a day (QID) | INTRAMUSCULAR | Status: DC | PRN
  Administered 2020-03-05: 4 mg via INTRAVENOUS
  Filled 2020-03-03: qty 2

## 2020-03-03 NOTE — Progress Notes (Signed)
[facility] [place] NAME: Omar Christian    MR#:  [id number]  DATE OF BIRTH:  1956/02/05  SUBJECTIVE:  CHIEF COMPLAINT:   Chief Complaint  Patient presents with  . Code Sepsis  Very hard of hearing.  Daughter at bedside providing most of the information.  She is requesting nystatin swish and swallow  REVIEW OF SYSTEMS:  Review of Systems  Constitutional: Positive for fever. Negative for diaphoresis, malaise/fatigue and weight loss.  HENT: Positive for hearing loss. Negative for ear discharge, ear pain, nosebleeds, sore throat and tinnitus.   Eyes: Negative for blurred vision and pain.  Respiratory: Negative for cough, hemoptysis, shortness of breath and wheezing.   Cardiovascular: Negative for chest pain, palpitations, orthopnea and leg swelling.  Gastrointestinal: Negative for abdominal pain, blood in stool, constipation, diarrhea, heartburn, nausea and vomiting.  Genitourinary: Negative for dysuria, frequency and urgency.  Musculoskeletal: Negative for back pain and myalgias.  Skin: Negative for itching and rash.  Neurological: Negative for dizziness, tingling, tremors, focal weakness, seizures, weakness and headaches.  Psychiatric/Behavioral: Negative for depression. The patient is not nervous/anxious.    DRUG ALLERGIES:  No Known Allergies VITALS:  Blood pressure 132/61, pulse 86, temperature (!) 102.7 F (39.3 C), temperature source Oral, resp. rate (!) 24, weight 98 kg, SpO2 96 %. PHYSICAL EXAMINATION:  Physical Exam HENT:     Head: Normocephalic and atraumatic.     Mouth/Throat:     Mouth: Oral lesions present.     Tongue: Lesions present.     Palate: Lesions present.     Comments: Multiple oral sores on the roof of his mouth and on the tongue Eyes:     Conjunctiva/sclera: Conjunctivae normal.     Pupils: Pupils are equal, round, and reactive to light.  Neck:     Thyroid: No thyromegaly.     Trachea: No tracheal deviation.    Cardiovascular:     Rate and Rhythm: Normal rate and regular rhythm.     Heart sounds: Normal heart sounds.  Pulmonary:     Effort: Pulmonary effort is normal. No respiratory distress.     Breath sounds: Normal breath sounds. No wheezing.  Chest:     Chest wall: No tenderness.     Comments: Port-A-Cath on right chest wall Abdominal:     General: Bowel sounds are normal. There is no distension.     Palpations: Abdomen is soft.     Tenderness: There is no abdominal tenderness.  Musculoskeletal:        General: Normal range of motion.     Cervical back: Normal range of motion and neck supple.  Skin:    General: Skin is warm and dry.     Findings: No rash.  Neurological:     Mental Status: He is alert and oriented to person, place, and time.     Cranial Nerves: No cranial nerve deficit.    LABORATORY PANEL:  Male CBC Recent Labs  Lab 03/03/20 1146  WBC 4.2  HGB 9.1*  HCT 27.4*  PLT 71*   ------------------------------------------------------------------------------------------------------------------ Chemistries  Recent Labs  Lab 03/02/20 2220 03/02/20 2220 03/03/20 1146  NA 132*   < > 132*  K 4.3   < > 4.4  CL 98   < > 100  CO2 23   < > 21*  GLUCOSE 232*   < > 254*  BUN 22   < > 32*  CREATININE 1.25*   < >  1.62*  CALCIUM 8.6*   < > 8.0*  MG 1.7  --   --   AST 31   < > 35  ALT 26   < > 31  ALKPHOS 90   < > 61  BILITOT 0.7   < > 1.1   < > = values in this interval not displayed.   RADIOLOGY:  CT Angio Chest PE W and/or Wo Contrast  Result Date: 03/03/2020 CLINICAL DATA:  Tonsillar cancer currently undergoing chemotherapy, fever EXAM: CT ANGIOGRAPHY CHEST WITH CONTRAST TECHNIQUE: Multidetector CT imaging of the chest was performed using the standard protocol during bolus administration of intravenous contrast. Multiplanar CT image reconstructions and MIPs were obtained to evaluate the vascular anatomy. CONTRAST:  76mL OMNIPAQUE IOHEXOL 350 MG/ML SOLN COMPARISON:   03/02/2020, 01/26/2020 FINDINGS: Cardiovascular: This is a technically adequate evaluation of the pulmonary vasculature. No filling defects or pulmonary emboli. Postsurgical changes are seen from previous coronary bypass surgery. No pericardial effusion. Aneurysmal dilatation of the ascending thoracic aorta measures approximately 4.7 cm just above the sino-tubular junction. No evidence of dissection. Mediastinum/Nodes: No enlarged mediastinal, hilar, or axillary lymph nodes. Thyroid gland, trachea, and esophagus demonstrate no significant findings. Lungs/Pleura: Hypoventilatory changes are noted within the lingula and bilateral lower lobes. No acute airspace disease, effusion, or pneumothorax. Central airways are patent. A 4 mm subpleural right middle lobe nodule reference image 204 of series 5 is unchanged since 2017 and benign. No other pulmonary nodules or masses. Upper Abdomen: No acute abnormality. Musculoskeletal: Right chest wall port is identified via internal jugular approach, tip within the superior vena cava. There are no acute or destructive bony lesions. Reconstructed images demonstrate no additional findings. Review of the MIP images confirms the above findings. IMPRESSION: 1. No evidence of pulmonary embolus. 2. Bibasilar consolidation consistent with atelectasis. This accounts for the chest x-ray finding. 3. 4.7 cm ascending thoracic aortic aneurysm, previously measuring approximately 4.5 cm. Recommend semi-annual imaging followup by CTA or MRA and referral to cardiothoracic surgery if not already obtained. This recommendation follows 2010 ACCF/AHA/AATS/ACR/ASA/SCA/SCAI/SIR/STS/SVM Guidelines for the Diagnosis and Management of Patients With Thoracic Aortic Disease. Circulation. 2010; 121: D983-J825. Aortic aneurysm NOS (ICD10-I71.9) Electronically Signed   By: Randa Ngo M.D.   On: 03/03/2020 00:42   DG Chest Port 1 View  Result Date: 03/02/2020 CLINICAL DATA:  Fever. EXAM: PORTABLE CHEST 1  VIEW COMPARISON:  Nov 15, 2015 FINDINGS: There is a right-sided venous Port-A-Cath with its distal tip noted at the junction of the superior vena cava and right atrium. Multiple sternal wires are present. A 1.7 cm x 1.1 cm nodular opacity is seen overlying the lateral aspect of the left lung base. This represents a new finding when compared to the prior exam. Very mild left basilar atelectasis is also noted. There is no evidence of a pleural effusion or pneumothorax. The heart size and mediastinal contours are within normal limits. The visualized skeletal structures are unremarkable. IMPRESSION: 1. 1.7 cm x 1.1 cm nodular opacity overlying the left lung base. Correlation with chest CT is recommended to exclude the presence of an underlying lung mass. 2. Very mild left basilar atelectasis. 3. Evidence of prior median sternotomy. Electronically Signed   By: Virgina Norfolk M.D.   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, difficulty hearing, gout, GERD is admitted for sepsis  Severe  sepsis present on admission Could be oral source considering significant mucositis and source from radiation No evidence of pneumonia on CT chest, no urine infection on UA Continue empiric Vanco and Zosyn for now Appreciate ID input  Pancytopenia Likely secondary to chemotherapy and/or sepsis Monitor counts  Oral thrush/candidiasis Continue nystatin swish and swallow  Poor nutrition -Continue tube feeds per home regimen Dietitian consult   Acute kidney injury Likely from ATN in the setting of sepsis Continue treatment of sepsis and IV fluids and monitor kidney function.  Avoid nephrotoxic medications   Body mass index is 28.5 kg/m.      Status is: Inpatient  Remains inpatient appropriate because:Ongoing diagnostic testing needed not  appropriate for outpatient work up   Dispo: The patient is from: Home              Anticipated d/c is to: Home              Anticipated d/c date is: 3 days              Patient currently is not medically stable to d/c.       DVT prophylaxis:            Place and maintain sequential compression device Start: 03/03/20 0729     Family Communication: (Either discussion with patient's daughter [name] (she is a Designer, jewellery) at bedside -her cell number is [phone]   All the records are reviewed and case discussed with Care Management/Social Worker. Management plans discussed with the patient, family and they are in agreement.  CODE STATUS: Full Code  TOTAL TIME TAKING CARE OF THIS PATIENT: 35 minutes.   More than 50% of the time was spent in counseling/coordination of care: YES  POSSIBLE D/C IN 2-3 DAYS, DEPENDING ON CLINICAL CONDITION.   Max Sane M.D on 03/03/2020 at 4:32 PM  [facility]   CC: Primary care physician; Margo Common, PA  Note: This dictation was prepared with Dragon dictation along with smaller phrase technology. Any transcriptional errors that result from this process are unintentional.

## 2020-03-03 NOTE — ED Notes (Signed)
Patient transported to CT 

## 2020-03-03 NOTE — Consult Note (Signed)
Consultation Note Date: 03/03/2020   Patient Name: Omar Christian  DOB: [birth year]  MRN: [id number]  Age / Sex: 64 y.o., male  PCP: Margo Common, PA Referring Physician: Max Sane, MD  Reason for Consultation: Establishing goals of care and Psychosocial/spiritual support  HPI/Patient Profile: 64 y.o. male  with past medical history of HTN/HLD, CAD s/p CABG 2017, stage IV tonsillar cancer per diagnosed approximately 1 month ago currently undergoing chemotherapy with PEG tube placed August of this year, history of pneumonia, hard of hearing, gout, GERD, admitted on 03/02/2020 with severe sepsis, thrombocytopenia, acute kidney injury.   Clinical Assessment and Goals of Care:  I have reviewed medical records including EPIC notes, labs and imaging, received report from bedside nursing staff, examined the patient and met at bedside with wife of 61 years, Reid Regas to discuss diagnosis prognosis, [place], EOL wishes, disposition and options.  Mr. Decandia is sleeping soundly on the stretcher. He does not wake as I enter the room. He will wake easily with voice and touch, but is very hard of hearing. He is clearly exhausted.  Wife [name] talked about Mr. Latner's acute and chronic health concerns.I introduced Palliative Medicine as specialized medical care for people living with serious illness. It focuses on providing relief from the symptoms and stress of a serious illness.   We discussed a brief life review of the patient. Mr. Eveland and [name] have been married for 38 years. He has a daughter, [name] who is a Equities trader. She is not Carolyn's daughter, but [name] states that they are close.  As far as functional and nutritional status, Chrys Racer states that Mr. Baria has not worked for quite some time, I believe over 1 year. She states they have not been able to apply for disability yet, but have been  encouraged to do so. She states friends have suggested that they contact "the [facility]" for assistance, but they have been so busy with appointments they have been unable..  We talked about Mr. Say's acute health concerns including, but not limited to, sepsis/infection and the treatment plan, oral thrush/mucocystitis and the treatment plan, nutritional deficits and the treatment plan. We talked about checking blood cultures and infectious disease physician following. We discussed current illness and what it means in the larger context of on-going co-morbidities. We talked about time for outcomes and test results.  Advanced directives, concepts specific to code status, artifical feeding and hydration were considered and discussed. [name] states that she and her husband have talked about his desire to be an organ donor, but they have not talked about CODE STATUS. She states that daughter [name] recently discussed [place] with Mr. Balan and he stated he wanted to be full code, life support for 6 months. [name] states that [name] just sort of left at this will last him in 6 months with not an appropriate time for life support.   We talked about hospital admission, evaluation if needed for short-term rehab, but hopefully he will be strong enough to go  home with home health.  Outpatient palliative Care services outpatient were explained and offered. I encouraged Mrs. Mah to discuss outpatient palliative services with oncology service.  Questions and concerns were addressed.  The family was encouraged to call with questions or concerns.    HCPOA    NEXT OF KIN -wife of 27 years, Gustaf Mccarter. Mr. Mabus has a daughter daughter, [name], oriented who would assist in decision-making.    SUMMARY OF RECOMMENDATIONS   At this point continue to treat the treatable Continue CODE STATUS discussions Encourage outpatient palliative to continue discussions about "what if's and  maybe's".   Code Status/Advance Care Planning:  Full code -we discussed the concept of "treat the treatable but allowing natural death".  Symptom Management:   Per hospitalist, no additional needs at this time.  Palliative Prophylaxis:   Frequent Pain Assessment and Oral Care  Additional Recommendations (Limitations, Scope, Preferences):  Full Scope Treatment  Psycho-social/Spiritual:   Desire for further Chaplaincy support:no  Additional Recommendations: Caregiving  Support/Resources and Education on Hospice  Prognosis:   Unable to determine, based on outcomes.  Complicated by new diagnosis of transplant.  With current treatment regimen implemented.  Discharge Planning: To be determined, based on outcomes, hopefully home with home health      Primary Diagnoses: Present on Admission: . Severe sepsis ([place]) . HLD (hyperlipidemia) . Benign hypertension . CAD, multiple vessel . Essential (primary) hypertension . Tonsillar cancer ([place])   I have reviewed the medical record, interviewed the patient and family, and examined the patient. The following aspects are pertinent.  Past Medical History:  Diagnosis Date  . Abnormal findings on cardiac catheterization 09/30/15   [facility]  . Abnormal myocardial perfusion study 09/24/15   [facility]  . CAD, multiple vessel 09/30/15   per CATH @ [facility]/DR.PARACHOS  . Coronary artery disease   . GERD (gastroesophageal reflux disease)   . Gout   . H/O echocardiogram 09/24/15   [place]  . Hard of hearing   . History of pneumonia   . Hyperlipidemia   . Hypertension   . Pneumonia   . Progressive angina ([place])   . 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 Marital status: Married    Spouse name: Not on file  . Number of children: 2  . Years of education: Not on file  . Highest education level: Not on file  Occupational History  . Not on file  Tobacco  Use  . Smoking status: Never Smoker  . Smokeless tobacco: Never Used  Vaping Use  . Vaping Use: Never used  Substance and Sexual Activity  . Alcohol use: No  . Drug use: No  . Sexual activity: Not Currently    Birth control/protection: Surgical    Comment: Vasectomy  Other Topics Concern  . Not on file  Social History Narrative  . Not on file   Social Determinants of Health   Financial Resource Strain: Low Risk   . Difficulty of Paying Living Expenses: Not very hard  Food Insecurity:   . Worried About Charity fundraiser in the Last Year: Not on file  . Ran Out of Food in the Last Year: Not on file  Transportation Needs: No Transportation Needs  . Lack of Transportation (Medical): No  . Lack of Transportation (Non-Medical): No  Physical Activity:   . Days of Exercise per Week: Not on file  .  Minutes of Exercise per Session: Not on file  Stress:   . Feeling of Stress : Not on file  Social Connections:   . Frequency of Communication with Friends and Family: Not on file  . Frequency of Social Gatherings with Friends and Family: Not on file  . Attends Religious Services: Not on file  . Active Member of Clubs or Organizations: Not on file  . Attends Archivist Meetings: Not on file  . Marital Status: Not on file   Family History  Problem Relation Age of Onset  . Heart disease Mother   . Hypertension Mother   . Alcohol abuse Father   . Throat cancer Father   . Muscular dystrophy Brother   . Muscular dystrophy Brother    Scheduled Meds: . allopurinol  100 mg Oral Daily  . atorvastatin  20 mg Oral Daily  . feeding supplement (ENSURE ENLIVE)  237 mL Oral TID BM  . feeding supplement (KATE FARMS STANDARD 1.4)  325 mL Per Tube QID  . free water  240 mL Per Tube Q6H  . metoprolol succinate  25 mg Oral Daily  . nystatin  5 mL Oral QID  . sodium chloride flush  3 mL Intravenous Q12H   Continuous Infusions: . piperacillin-tazobactam (ZOSYN)  IV    . vancomycin      PRN Meds:.acetaminophen **OR** acetaminophen, ondansetron **OR** ondansetron (ZOFRAN) IV, oxyCODONE, polyethylene glycol, traZODone Medications Prior to Admission:  Prior to Admission medications   Medication Sig Start Date End Date Taking? Authorizing Provider  allopurinol (ZYLOPRIM) 100 MG tablet Take 1 tablet (100 mg total) by mouth daily. 07/22/19  Yes Birdie Sons, MD  atorvastatin (LIPITOR) 20 MG tablet Take 1 tablet (20 mg total) by mouth daily. 09/25/19  Yes Chrismon, Vickki Muff, PA  dexamethasone (DECADRON) 4 MG tablet Take 2 tablets (8 mg total) by mouth daily. Take daily for 3 days after chemo. Take with food. 02/02/20  Yes [name], MD  lidocaine (XYLOCAINE) 2 % solution Patient: Mix 1part 2% viscous lidocaine, 1part H20. Swish & swallow 16m of diluted mixture, 382m before eating and at bedtime, up to 5xs daily. 03/01/20  Yes SqEppie GibsonMD  lidocaine-prilocaine (EMLA) cream Apply to affected area once 02/02/20  Yes GuNicholas LoseMD  LORazepam (ATIVAN) 0.5 MG tablet Take 1 tablet (0.5 mg total) by mouth at bedtime as needed (Nausea or vomiting). 02/02/20  Yes GuNicholas LoseMD  magic mouthwash w/lidocaine SOLN Take 5 mLs by mouth 3 (three) times daily as needed for mouth pain. 03/01/20  Yes GuNicholas LoseMD  metoprolol succinate (TOPROL XL) 25 MG 24 hr tablet Take 1 tablet (25 mg total) by mouth daily. 02/17/20  Yes GuNicholas LoseMD  Nutritional Supplements (KATE FARMS STANDARD 1.4) LIQD 488 mLs by Gastrostomy Tube route 4 (four) times daily. 02/25/20  Yes SqEppie GibsonMD  nystatin (MYCOSTATIN) 100000 UNIT/ML suspension Take 5 mLs (500,000 Units total) by mouth in the morning, at noon, and at bedtime. 03/02/20  Yes GuNicholas LoseMD  ondansetron (ZOFRAN) 8 MG tablet Take 1 tablet (8 mg total) by mouth 2 (two) times daily as needed. Start on the third day after chemotherapy. 02/02/20  Yes GuNicholas LoseMD  oxyCODONE-acetaminophen (PERCOCET/ROXICET) 5-325 MG tablet Take 1 tablet by  mouth every 4 (four) hours as needed.  01/14/20  Yes [provider]  prochlorperazine (COMPAZINE) 10 MG tablet Take 1 tablet (10 mg total) by mouth every 6 (six) hours as needed (Nausea or  vomiting). 02/02/20  Yes [name], MD  sodium fluoride (PREVIDENT 5000 PLUS) 1.1 % CREA dental cream Apply to tooth brush. Brush teeth for 2 minutes. Spit out excess-DO NOT swallow. DO NOT rinse afterwards. Repeat nightly. 01/28/20  Yes [name], DDS  acetaminophen (TYLENOL) 650 MG CR tablet Take 650 mg by mouth daily.  Patient not taking: Reported on 02/27/2020    [provider]  colchicine 0.6 MG tablet Take 1 tablet (0.6 mg total) by mouth daily. Patient not taking: Reported on 03/02/2020 02/17/20   [name], MD   No Known Allergies Review of Systems  Unable to perform ROS: Other    Physical Exam Vitals and nursing note reviewed.  Constitutional:      General: He is not in acute distress.    Appearance: He is ill-appearing.  HENT:     Mouth/Throat:     Mouth: Mucous membranes are dry.  Cardiovascular:     Rate and Rhythm: Normal rate.  Pulmonary:     Effort: Pulmonary effort is normal. No respiratory distress.  Skin:    General: Skin is warm and dry.  Neurological:     Mental Status: He is alert.     Vital Signs: BP 123/69   Pulse (!) 101   Temp (!) 102.7 F (39.3 C) (Oral)   Resp (!) 30   Wt 98 kg   SpO2 (!) 89%   BMI 28.50 kg/m      Pain Score: 0-No pain   SpO2: SpO2: (!) 89 % O2 Device:SpO2: (!) 89 % O2 Flow Rate: .O2 Flow Rate (L/min): 2 L/min  IO: Intake/output summary:   Intake/Output Summary (Last 24 hours) at 03/03/2020 1449 Last data filed at 03/03/2020 0458 Gross per 24 hour  Intake 3292.61 ml  Output --  Net 3292.61 ml    LBM:   Baseline Weight: Weight: 98 kg Most recent weight: Weight: 98 kg     Palliative Assessment/Data:   Flowsheet Rows     Most Recent Value  Intake Tab  Referral Department Hospitalist  Unit at Time  of Referral ER  Palliative Care Primary Diagnosis Sepsis/Infectious Disease  Date Notified 03/03/20  Palliative Care Type New Palliative care  Reason for referral Clarify Goals of Care  Date of Admission 03/02/20  Date first seen by Palliative Care 03/03/20  # of days Palliative referral response time 0 Day(s)  # of days IP prior to Palliative referral 1  Clinical Assessment  Palliative Performance Scale Score 50%  Pain Max last 24 hours Not able to report  Pain Min Last 24 hours Not able to report  Dyspnea Max Last 24 Hours Not able to report  Dyspnea Min Last 24 hours Not able to report  Psychosocial & Spiritual Assessment  Palliative Care Outcomes      Time In: 1430 Time Out: 1540 Time Total: 70 minutes  Greater than 50%  of this time was spent counseling and coordinating care related to the above assessment and plan.  Signed by: Drue Novel, NP   Please contact Palliative Medicine Team phone at [phone] for questions and concerns.  For individual provider: See Shea Evans

## 2020-03-03 NOTE — Consult Note (Signed)
Infectious Disease     Reason for Consult: Fever, immunocompromised   Referring Physician:Dr Eckstat Date of Admission:  03/02/2020   Active Problems:   H/O: gout   HLD (hyperlipidemia)   Benign hypertension   CAD, multiple vessel   S/P CABG x 4   Essential (primary) hypertension   Tonsillar cancer (HCC)   Severe sepsis (HCC)   HPI: Omar Christian is a 64 y.o. 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 He was in usual state of health yesterday when he got his second chemo infusion.  However when he got home he started to develop fevers and chills.  He also has been having a lot of oral lesions from mucositis from the radiation and chemo.  He had had several teeth pulled prior to starting chemo.  He reports that he does have a mild cough but does not recall any aspiration.  He does have a Port-A-Cath in place and a PEG tube. On admission his temperature was 103.3 and his heart rate was 130s.  Chest x-ray showed nodular opacity and a CT was done which showed no PE but bibasilar atelectasis.  Blood cultures have been done.  On admission His white count was 3.4.  He was not neutropenic. He was started on vancomycin and cefepime and Flagyl.  He developed a rash in his right arm while infusing Flagyl.  The rest of the infusion was held. He reports feeling a little better currently.  Main complaint is oral sores.  He started nystatin.  Past Medical History:  Diagnosis Date  . Abnormal findings on cardiac catheterization 09/30/15   [facility]  . Abnormal myocardial perfusion study 09/24/15   [facility]  . CAD, multiple vessel 09/30/15   per CATH @ [facility]/DR.PARACHOS  . Coronary artery disease   . GERD (gastroesophageal reflux disease)   . Gout   . H/O echocardiogram 09/24/15   [facility]  . Hard of hearing   . History of pneumonia   . Hyperlipidemia   . Hypertension   . Pneumonia   . Progressive angina ([place])   . Tonsil cancer ([place])    squamous of  carcinoma of the right tonsil   Past Surgical History:  Procedure Laterality Date  . CARDIAC CATHETERIZATION N/A 09/30/2015   Procedure: Left Heart Cath and Coronary Angiography;  Surgeon: [name], MD;  Location: [place] [facility];  Service: Cardiovascular;  Laterality: N/A;  . CORONARY ARTERY BYPASS GRAFT N/A 10/08/2015   Procedure: CORONARY ARTERY BYPASS GRAFTING time 4 using left internal mammary and right greater saphenous vein harvested by endovein;  Surgeon: [name], MD;  Location: [place];  Service: Open Heart Surgery;  Laterality: N/A;  . CORONARY ARTERY BYPASS GRAFT    . IR GASTROSTOMY TUBE MOD SED  02/10/2020  . IR IMAGING GUIDED PORT INSERTION  02/10/2020  . MULTIPLE EXTRACTIONS WITH ALVEOLOPLASTY N/A 02/04/2020   Procedure: Extraction of tooth #'s [phone], and 32 with alveoloplasty and gross debridement of remaining teeth.;  Surgeon: [name], DDS;  Location: [place];  Service: Oral Surgery;  Laterality: N/A;  . TEE WITHOUT CARDIOVERSION N/A 10/08/2015   Procedure: TRANSESOPHAGEAL ECHOCARDIOGRAM (TEE);  Surgeon: [name], MD;  Location: [place];  Service: Open Heart Surgery;  Laterality: N/A;  . VASECTOMY    . WISDOM TOOTH EXTRACTION     Social History   Tobacco Use  . Smoking status: Never Smoker  . Smokeless tobacco: Never Used  Vaping Use  . Vaping Use: Never used  Substance Use Topics  . Alcohol use: No  . Drug use: No   Family History  Problem Relation Age of Onset  . Heart disease Mother   . Hypertension Mother   . Alcohol abuse Father   . Throat cancer Father   . Muscular dystrophy Brother   . Muscular dystrophy Brother     Allergies: No Known Allergies  Current antibiotics: Antibiotics Given (last 72 hours)    Date/Time Action Medication Dose Rate   03/02/20 2251 New Bag/Given   ceFEPIme (MAXIPIME) 2 g in sodium chloride 0.9 % 100 mL IVPB 2 g 200 mL/hr   03/02/20 2254 New Bag/Given   metroNIDAZOLE (FLAGYL) IVPB 500  mg 500 mg 100 mL/hr   03/02/20 2314 New Bag/Given   vancomycin (VANCOCIN) IVPB 1000 mg/200 mL premix 1,000 mg 200 mL/hr   03/03/20 0757 New Bag/Given   metroNIDAZOLE (FLAGYL) IVPB 500 mg 500 mg 100 mL/hr   03/03/20 0919 New Bag/Given   ceFEPIme (MAXIPIME) 2 g in sodium chloride 0.9 % 100 mL IVPB 2 g 200 mL/hr   03/03/20 1013 New Bag/Given   vancomycin (VANCOREADY) IVPB 1250 mg/250 mL 1,250 mg 166.7 mL/hr      MEDICATIONS: . allopurinol  100 mg Oral Daily  . atorvastatin  20 mg Oral Daily  . metoprolol succinate  25 mg Oral Daily  . nystatin  5 mL Oral QID  . sodium chloride flush  3 mL Intravenous Q12H    Review of Systems - 11 systems reviewed and negative per HPI   OBJECTIVE: Temp:  [98.8 F (37.1 C)-103.3 F (39.6 C)] 102.7 F (39.3 C) (09/01 0732) Pulse Rate:  [93-132] 101 (09/01 0730) Resp:  [19-30] 30 (09/01 0730) BP: (108-135)/(52-71) 123/69 (09/01 0730) SpO2:  [89 %-100 %] 89 % (09/01 0730) Weight:  [98 kg] 98 kg (08/31 2208) Physical Exam  Constitutional: He is oriented to person, place, and time. He appears well-developed and well-nourished. No distress.  HENT: Mucous membranes are dry.  He has multiple oral sores on his tongue and the roof of his mouth.  Lymphadenopathy on the right at the site of his malignancy. Cardiovascular: Normal rate, regular rhythm and normal heart sounds. Exam reveals no gallop and no friction rub.  No murmur heard.  Pulmonary/Chest: Effort normal and breath sounds normal. No respiratory distress. He has no wheezes.  Right chest wall with Port-A-Cath.  Within normal limits. Abdominal: Soft. Bowel sounds are normal. He exhibits no distension. There is no tenderness.  PEG tube in the abdomen within normal limits. Lymphadenopathy: He has no cervical adenopathy.  Neurological: He is alert and oriented to person, place, and time.  Skin: Skin is warm and dry. No rash noted. No erythema.  Psychiatric: He has a normal mood and affect. His  behavior is normal.     LABS: Results for orders placed or performed during the hospital encounter of 03/02/20 (from the past 48 hour(s))  Lactic acid, plasma     Status: Abnormal   Collection Time: 03/02/20 10:20 PM  Result Value Ref Range   Lactic Acid, Venous 4.0 (HH) 0.5 - 1.9 mmol/L    Comment: CRITICAL RESULT CALLED TO, READ BACK BY AND VERIFIED WITH Rex Kras RN [id number] 03/02/20 HNM Performed at [facility], [address] [place], [place] [address]   Comprehensive metabolic panel     Status: Abnormal   Collection Time: 03/02/20 10:20 PM  Result Value Ref Range  Sodium 132 (L) 135 - 145 mmol/L   Potassium 4.3 3.5 - 5.1 mmol/L   Chloride 98 98 - 111 mmol/L   CO2 23 22 - 32 mmol/L   Glucose, Bld 232 (H) 70 - 99 mg/dL    Comment: Glucose reference range applies only to samples taken after fasting for at least 8 hours.   BUN 22 8 - 23 mg/dL   Creatinine, Ser 1.25 (H) 0.61 - 1.24 mg/dL   Calcium 8.6 (L) 8.9 - 10.3 mg/dL   Total Protein 6.5 6.5 - 8.1 g/dL   Albumin 3.4 (L) 3.5 - 5.0 g/dL   AST 31 15 - 41 U/L   ALT 26 0 - 44 U/L   Alkaline Phosphatase 90 38 - 126 U/L   Total Bilirubin 0.7 0.3 - 1.2 mg/dL   GFR calc non Af Amer >60 >60 mL/min   GFR calc Af Amer >60 >60 mL/min   Anion gap 11 5 - 15    Comment: Performed at [facility], [address] [place], [place] [address]  CBC WITH DIFFERENTIAL     Status: Abnormal   Collection Time: 03/02/20 10:20 PM  Result Value Ref Range   WBC 3.4 (L) 4.0 - 10.5 K/uL   RBC 3.80 (L) 4.22 - 5.81 MIL/uL   Hemoglobin 10.3 (L) 13.0 - 17.0 g/dL   HCT 32.0 (L) 39 - 52 %   MCV 84.2 80.0 - 100.0 fL   MCH 27.1 26.0 - 34.0 pg   MCHC 32.2 30.0 - 36.0 g/dL   RDW 13.9 11.5 - 15.5 %   Platelets 68 (L) 150 - 400 K/uL    Comment: PLATELET COUNT CONFIRMED BY SMEAR Immature Platelet Fraction may be clinically indicated, consider ordering this additional test [id number]    nRBC 0.0 0.0 - 0.2 %   Neutrophils Relative %  96 %   Neutro Abs 3.2 1.7 - 7.7 K/uL   Lymphocytes Relative 1 %   Lymphs Abs 0.0 (L) 0.7 - 4.0 K/uL   Monocytes Relative 3 %   Monocytes Absolute 0.1 0 - 1 K/uL   Eosinophils Relative 0 %   Eosinophils Absolute 0.0 0 - 0 K/uL   Basophils Relative 0 %   Basophils Absolute 0.0 0 - 0 K/uL   WBC Morphology MORPHOLOGY UNREMARKABLE    RBC Morphology MORPHOLOGY UNREMARKABLE    Smear Review Normal platelet morphology    Immature Granulocytes 0 %   Abs Immature Granulocytes 0.01 0.00 - 0.07 K/uL   Ovalocytes PRESENT     Comment: Performed at [facility], [address] [place], [place] [address]  Protime-INR     Status: None   Collection Time: 03/02/20 10:20 PM  Result Value Ref Range   Prothrombin Time 13.0 11.4 - 15.2 seconds   INR 1.0 0.8 - 1.2    Comment: (NOTE) INR goal varies based on device and disease states. Performed at [facility], [address] [place], [place] [address]   Blood Culture (routine x 2)     Status: None (Preliminary result)   Collection Time: 03/02/20 10:20 PM   Specimen: BLOOD  Result Value Ref Range   Specimen Description BLOOD RIGHT AC    Special Requests      BOTTLES DRAWN AEROBIC AND ANAEROBIC Blood Culture results may not be optimal due to an excessive volume of blood received in culture bottles   Culture      NO GROWTH < 12 HOURS Performed at [facility],  [place], [place] [address]    Report Status PENDING   Blood Culture (routine x 2)     Status: None (Preliminary result)   Collection Time: 03/02/20 10:20 PM   Specimen: BLOOD  Result Value Ref Range   Specimen Description BLOOD LEFT AC    Special Requests      BOTTLES DRAWN AEROBIC AND ANAEROBIC Blood Culture results may not be optimal due to an excessive volume of blood received in culture bottles   Culture      NO GROWTH < 12 HOURS Performed at [facility], [address] [place], [place] [address]    Report Status PENDING    Magnesium     Status: None   Collection Time: 03/02/20 10:20 PM  Result Value Ref Range   Magnesium 1.7 1.7 - 2.4 mg/dL    Comment: Performed at [facility], [place], [place] [address]  Troponin I (High Sensitivity)     Status: Abnormal   Collection Time: 03/02/20 10:20 PM  Result Value Ref Range   Troponin I (High Sensitivity) 24 (H) <18 ng/L    Comment: (NOTE) Elevated high sensitivity troponin I (hsTnI) values and significant  changes across serial measurements may suggest ACS but many other  chronic and acute conditions are known to elevate hsTnI results.  Refer to the "Links" section for chest pain algorithms and additional  guidance. Performed at [facility], [address] [place], [place] [address]   SARS Coronavirus 2 by RT PCR (hospital order, performed in [facility] hospital lab) Nasopharyngeal Nasopharyngeal Swab     Status: None   Collection Time: 03/02/20 11:40 PM   Specimen: Nasopharyngeal Swab  Result Value Ref Range   SARS Coronavirus 2 NEGATIVE NEGATIVE    Comment: (NOTE) SARS-CoV-2 target nucleic acids are NOT DETECTED.  The SARS-CoV-2 RNA is generally detectable in upper and lower respiratory specimens during the acute phase of infection. The lowest concentration of SARS-CoV-2 viral copies this assay can detect is 250 copies / mL. A negative result does not preclude SARS-CoV-2 infection and should not be used as the sole basis for treatment or other patient management decisions.  A negative result may occur with improper specimen collection / handling, submission of specimen other than nasopharyngeal swab, presence of viral mutation(s) within the areas targeted by this assay, and inadequate number of viral copies (<250 copies / mL). A negative result must be combined with clinical observations, patient history, and epidemiological information.  Fact Sheet for Patients:    StrictlyIdeas.no  Fact Sheet for Healthcare Providers: BankingDealers.co.za  This test is not yet approved or  cleared by the [place] FDA and has been authorized for detection and/or diagnosis of SARS-CoV-2 by FDA under an Emergency Use Authorization (EUA).  This EUA will remain in effect (meaning this test can be used) for the duration of the COVID-19 declaration under Section 564(b)(1) of the Act, 21 U.S.C. section 360bbb-3(b)(1), unless the authorization is terminated or revoked sooner.  Performed at [facility], [address] [place], [place] [address]   APTT     Status: None   Collection Time: 03/02/20 11:40 PM  Result Value Ref Range   aPTT 35 24 - 36 seconds    Comment: Performed at [facility], [place],  [address]  Lactic acid, plasma     Status: Abnormal   Collection Time: 03/03/20 12:57 AM  Result Value Ref Range   Lactic  Acid, Venous 3.8 (HH) 0.5 - 1.9 mmol/L    Comment: CRITICAL VALUE NOTED. VALUE IS CONSISTENT WITH PREVIOUSLY REPORTED/CALLED VALUE SKL Performed at [facility], [place], [place] [address]   Troponin I (High Sensitivity)     Status: Abnormal   Collection Time: 03/03/20 12:57 AM  Result Value Ref Range   Troponin I (High Sensitivity) 35 (H) <18 ng/L    Comment: (NOTE) Elevated high sensitivity troponin I (hsTnI) values and significant  changes across serial measurements may suggest ACS but many other  chronic and acute conditions are known to elevate hsTnI results.  Refer to the "Links" section for chest pain algorithms and additional  guidance. Performed at [facility], [address] [place], [place] [address]   Lactic acid, plasma     Status: Abnormal   Collection Time: 03/03/20  3:33 AM  Result Value Ref Range   Lactic Acid, Venous 2.6 (HH) 0.5 - 1.9 mmol/L    Comment: CRITICAL VALUE NOTED. VALUE IS  CONSISTENT WITH PREVIOUSLY REPORTED/CALLED VALUE HNM Performed at [facility], [place], [place] [address]   Troponin I (High Sensitivity)     Status: Abnormal   Collection Time: 03/03/20  3:33 AM  Result Value Ref Range   Troponin I (High Sensitivity) 42 (H) <18 ng/L    Comment: (NOTE) Elevated high sensitivity troponin I (hsTnI) values and significant  changes across serial measurements may suggest ACS but many other  chronic and acute conditions are known to elevate hsTnI results.  Refer to the "Links" section for chest pain algorithms and additional  guidance. Performed at [facility], [address] [place],  [address]   Glucose, capillary     Status: Abnormal   Collection Time: 03/03/20  3:37 AM  Result Value Ref Range   Glucose-Capillary 163 (H) 70 - 99 mg/dL    Comment: Glucose reference range applies only to samples taken after fasting for at least 8 hours.  Urinalysis, Complete w Microscopic     Status: Abnormal   Collection Time: 03/03/20  3:49 AM  Result Value Ref Range   Color, Urine YELLOW (A) YELLOW   APPearance CLEAR (A) CLEAR   Specific Gravity, Urine 1.033 (H) 1.005 - 1.030   pH 5.0 5.0 - 8.0   Glucose, UA NEGATIVE NEGATIVE mg/dL   Hgb urine dipstick NEGATIVE NEGATIVE   Bilirubin Urine NEGATIVE NEGATIVE   Ketones, ur NEGATIVE NEGATIVE mg/dL   Protein, ur NEGATIVE NEGATIVE mg/dL   Nitrite NEGATIVE NEGATIVE   Leukocytes,Ua NEGATIVE NEGATIVE   RBC / HPF 0-5 0 - 5 RBC/hpf   WBC, UA 6-10 0 - 5 WBC/hpf   Bacteria, UA NONE SEEN NONE SEEN   Squamous Epithelial / LPF NONE SEEN 0 - 5   Mucus PRESENT     Comment: Performed at [facility], [address] [place], [place] [address]  Lactic acid, plasma     Status: Abnormal   Collection Time: 03/03/20  5:37 AM  Result Value Ref Range   Lactic Acid, Venous 2.8 (HH) 0.5 - 1.9 mmol/L    Comment: CRITICAL VALUE NOTED. VALUE IS CONSISTENT WITH PREVIOUSLY  REPORTED/CALLED VALUE HNM Performed at [facility], [address] [place],  [address]   Troponin I (High Sensitivity)     Status: Abnormal   Collection Time: 03/03/20  5:37 AM  Result Value Ref Range   Troponin I (High Sensitivity) 42 (H) <18 ng/L    Comment: (  NOTE) Elevated high sensitivity troponin I (hsTnI) values and significant  changes across serial measurements may suggest ACS but many other  chronic and acute conditions are known to elevate hsTnI results.  Refer to the "Links" section for chest pain algorithms and additional  guidance. Performed at [facility], [address] [place], [place] [address]   Glucose, capillary     Status: Abnormal   Collection Time: 03/03/20  5:42 AM  Result Value Ref Range   Glucose-Capillary 178 (H) 70 - 99 mg/dL    Comment: Glucose reference range applies only to samples taken after fasting for at least 8 hours.  Protime-INR     Status: None   Collection Time: 03/03/20 11:46 AM  Result Value Ref Range   Prothrombin Time 14.3 11.4 - 15.2 seconds   INR 1.2 0.8 - 1.2    Comment: (NOTE) INR goal varies based on device and disease states. Performed at [facility], [address] [place], [place] [address]   Comprehensive metabolic panel     Status: Abnormal   Collection Time: 03/03/20 11:46 AM  Result Value Ref Range   Sodium 132 (L) 135 - 145 mmol/L   Potassium 4.4 3.5 - 5.1 mmol/L   Chloride 100 98 - 111 mmol/L   CO2 21 (L) 22 - 32 mmol/L   Glucose, Bld 254 (H) 70 - 99 mg/dL    Comment: Glucose reference range applies only to samples taken after fasting for at least 8 hours.   BUN 32 (H) 8 - 23 mg/dL   Creatinine, Ser 1.62 (H) 0.61 - 1.24 mg/dL   Calcium 8.0 (L) 8.9 - 10.3 mg/dL   Total Protein 5.3 (L) 6.5 - 8.1 g/dL   Albumin 2.8 (L) 3.5 - 5.0 g/dL   AST 35 15 - 41 U/L   ALT 31 0 - 44 U/L   Alkaline Phosphatase 61 38 - 126 U/L   Total Bilirubin 1.1 0.3 - 1.2 mg/dL   GFR calc non Af  Amer 45 (L) >60 mL/min   GFR calc Af Amer 52 (L) >60 mL/min   Anion gap 11 5 - 15    Comment: Performed at [facility], [address] [place], [place] [address]  CBC     Status: Abnormal   Collection Time: 03/03/20 11:46 AM  Result Value Ref Range   WBC 4.2 4.0 - 10.5 K/uL   RBC 3.32 (L) 4.22 - 5.81 MIL/uL   Hemoglobin 9.1 (L) 13.0 - 17.0 g/dL   HCT 27.4 (L) 39 - 52 %   MCV 82.5 80.0 - 100.0 fL   MCH 27.4 26.0 - 34.0 pg   MCHC 33.2 30.0 - 36.0 g/dL   RDW 14.1 11.5 - 15.5 %   Platelets 71 (L) 150 - 400 K/uL    Comment: Immature Platelet Fraction may be clinically indicated, consider ordering this additional test [id number]    nRBC 0.0 0.0 - 0.2 %    Comment: Performed at [facility], [address] [place], [place] [address]   No components found for: ESR, C REACTIVE PROTEIN MICRO: Recent Results (from the past 720 hour(s))  SARS CORONAVIRUS 2 (TAT 6-24 HRS) Nasopharyngeal Nasopharyngeal Swab     Status: None   Collection Time: 02/03/20  9:57 AM   Specimen: Nasopharyngeal Swab  Result Value Ref Range Status   SARS Coronavirus 2 NEGATIVE NEGATIVE Final    Comment: (NOTE) SARS-CoV-2 target nucleic acids are NOT DETECTED.  The SARS-CoV-2 RNA is  generally detectable in upper and lower respiratory specimens during the acute phase of infection. Negative results do not preclude SARS-CoV-2 infection, do not rule out co-infections with other pathogens, and should not be used as the sole basis for treatment or other patient management decisions. Negative results must be combined with clinical observations, patient history, and epidemiological information. The expected result is Negative.  Fact Sheet for Patients: SugarRoll.be  Fact Sheet for Healthcare Providers: https://www.woods-mathews.com/  This test is not yet approved or cleared by the [place] FDA and  has been authorized for detection and/or diagnosis  of SARS-CoV-2 by FDA under an Emergency Use Authorization (EUA). This EUA will remain  in effect (meaning this test can be used) for the duration of the COVID-19 declaration under Se ction 564(b)(1) of the Act, 21 U.S.C. section 360bbb-3(b)(1), unless the authorization is terminated or revoked sooner.  Performed at [facility], [place] [address], [place], [place] [address]   SARS CORONAVIRUS 2 (TAT 6-24 HRS) Nasopharyngeal Nasopharyngeal Swab     Status: None   Collection Time: 02/09/20 10:21 AM   Specimen: Nasopharyngeal Swab  Result Value Ref Range Status   SARS Coronavirus 2 NEGATIVE NEGATIVE Final    Comment: (NOTE) SARS-CoV-2 target nucleic acids are NOT DETECTED.  The SARS-CoV-2 RNA is generally detectable in upper and lower respiratory specimens during the acute phase of infection. Negative results do not preclude SARS-CoV-2 infection, do not rule out co-infections with other pathogens, and should not be used as the sole basis for treatment or other patient management decisions. Negative results must be combined with clinical observations, patient history, and epidemiological information. The expected result is Negative.  Fact Sheet for Patients: SugarRoll.be  Fact Sheet for Healthcare Providers: https://www.woods-mathews.com/  This test is not yet approved or cleared by the [place] FDA and  has been authorized for detection and/or diagnosis of SARS-CoV-2 by FDA under an Emergency Use Authorization (EUA). This EUA will remain  in effect (meaning this test can be used) for the duration of the COVID-19 declaration under Se ction 564(b)(1) of the Act, 21 U.S.C. section 360bbb-3(b)(1), unless the authorization is terminated or revoked sooner.  Performed at [facility], [place] [address] [place], [place] [address]   Blood Culture (routine x 2)     Status: None (Preliminary result)   Collection Time: 03/02/20 10:20  PM   Specimen: BLOOD  Result Value Ref Range Status   Specimen Description BLOOD RIGHT AC  Final   Special Requests   Final    BOTTLES DRAWN AEROBIC AND ANAEROBIC Blood Culture results may not be optimal due to an excessive volume of blood received in culture bottles   Culture   Final    NO GROWTH < 12 HOURS Performed at [facility], [address] [place],  [address]    Report Status PENDING  Incomplete  Blood Culture (routine x 2)     Status: None (Preliminary result)   Collection Time: 03/02/20 10:20 PM   Specimen: BLOOD  Result Value Ref Range Status   Specimen Description BLOOD LEFT AC  Final   Special Requests   Final    BOTTLES DRAWN AEROBIC AND ANAEROBIC Blood Culture results may not be optimal due to an excessive volume of blood received in culture bottles   Culture   Final    NO GROWTH < 12 HOURS Performed at [facility], [address], [place],  [address]    Report Status PENDING  Incomplete  SARS Coronavirus 2 by RT PCR (hospital order, performed in [facility] hospital lab) Nasopharyngeal Nasopharyngeal Swab     Status: None   Collection Time: 03/02/20 11:40 PM   Specimen: Nasopharyngeal Swab  Result Value Ref Range Status   SARS Coronavirus 2 NEGATIVE NEGATIVE Final    Comment: (NOTE) SARS-CoV-2 target nucleic acids are NOT DETECTED.  The SARS-CoV-2 RNA is generally detectable in upper and lower respiratory specimens during the acute phase of infection. The lowest concentration of SARS-CoV-2 viral copies this assay can detect is 250 copies / mL. A negative result does not preclude SARS-CoV-2 infection and should not be used as the sole basis for treatment or other patient management decisions.  A negative result may occur with improper specimen collection / handling, submission of specimen other than nasopharyngeal swab, presence of viral mutation(s) within the areas targeted by this assay, and inadequate number of viral  copies (<250 copies / mL). A negative result must be combined with clinical observations, patient history, and epidemiological information.  Fact Sheet for Patients:   StrictlyIdeas.no  Fact Sheet for Healthcare Providers: BankingDealers.co.za  This test is not yet approved or  cleared by the [place] FDA and has been authorized for detection and/or diagnosis of SARS-CoV-2 by FDA under an Emergency Use Authorization (EUA).  This EUA will remain in effect (meaning this test can be used) for the duration of the COVID-19 declaration under Section 564(b)(1) of the Act, 21 U.S.C. section 360bbb-3(b)(1), unless the authorization is terminated or revoked sooner.  Performed at [facility], [address] [place], [place] [address]     IMAGING: CT Angio Chest PE W and/or Wo Contrast  Result Date: 03/03/2020 CLINICAL DATA:  Tonsillar cancer currently undergoing chemotherapy, fever EXAM: CT ANGIOGRAPHY CHEST WITH CONTRAST TECHNIQUE: Multidetector CT imaging of the chest was performed using the standard protocol during bolus administration of intravenous contrast. Multiplanar CT image reconstructions and MIPs were obtained to evaluate the vascular anatomy. CONTRAST:  31m OMNIPAQUE IOHEXOL 350 MG/ML SOLN COMPARISON:  03/02/2020, 01/26/2020 FINDINGS: Cardiovascular: This is a technically adequate evaluation of the pulmonary vasculature. No filling defects or pulmonary emboli. Postsurgical changes are seen from previous coronary bypass surgery. No pericardial effusion. Aneurysmal dilatation of the ascending thoracic aorta measures approximately 4.7 cm just above the sino-tubular junction. No evidence of dissection. Mediastinum/Nodes: No enlarged mediastinal, hilar, or axillary lymph nodes. Thyroid gland, trachea, and esophagus demonstrate no significant findings. Lungs/Pleura: Hypoventilatory changes are noted within the lingula and bilateral  lower lobes. No acute airspace disease, effusion, or pneumothorax. Central airways are patent. A 4 mm subpleural right middle lobe nodule reference image 204 of series 5 is unchanged since 2017 and benign. No other pulmonary nodules or masses. Upper Abdomen: No acute abnormality. Musculoskeletal: Right chest wall port is identified via internal jugular approach, tip within the superior vena cava. There are no acute or destructive bony lesions. Reconstructed images demonstrate no additional findings. Review of the MIP images confirms the above findings. IMPRESSION: 1. No evidence of pulmonary embolus. 2. Bibasilar consolidation consistent with atelectasis. This accounts for the chest x-ray finding. 3. 4.7 cm ascending thoracic aortic aneurysm, previously measuring approximately 4.5 cm. Recommend semi-annual imaging followup by CTA or MRA and referral to cardiothoracic surgery if not already obtained. This recommendation follows 2010 ACCF/AHA/AATS/ACR/ASA/SCA/SCAI/SIR/STS/SVM Guidelines for the Diagnosis and Management of Patients With Thoracic Aortic Disease. Circulation. 2010; 121:: B716-R678 Aortic aneurysm NOS (ICD10-I71.9) Electronically Signed   By: MDiana EvesD.  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 EXAM: PERCUTANEOUS GASTROSTOMY TUBE PLACEMENT ANESTHESIA/SEDATION: 1.0 mg IV Versed; 50 mcg IV Fentanyl. Total Moderate Sedation Time 11 minutes. The patient's level of consciousness and physiologic status were continuously monitored during the procedure by Radiology nursing. CONTRAST:  81m OMNIPAQUE IOHEXOL 300 MG/ML  SOLN MEDICATIONS: 2 g IV Ancef. IV antibiotic was administered in an appropriate time interval prior to needle puncture of the skin. FLUOROSCOPY TIME:  3 minutes and 36 seconds.  80.0 mGy. PROCEDURE: The procedure, risks, benefits, and  alternatives were explained to the patient. Questions regarding the procedure were encouraged and answered. The patient understands and consents to the procedure. A time-out was performed prior to initiating the procedure. The evening prior to the procedure, the patient was given thin liquid barium to ingest in order to opacify the colon. A 5-French catheter was then advanced through the patient's mouth under fluoroscopy into the esophagus and to the level of the stomach. This catheter was used to insufflate the stomach with air under fluoroscopy. The abdominal wall was prepped with chlorhexidine in a sterile fashion, and a sterile drape was applied covering the operative field. A sterile gown and sterile gloves were used for the procedure. Local anesthesia was provided with 1% Lidocaine. A skin incision was made in the upper abdominal wall. Under fluoroscopy, an 18 gauge trocar needle was advanced into the stomach. Contrast injection was performed to confirm intraluminal position of the needle tip. A single T tack was then deployed in the lumen of the stomach. This was brought up to tension at the skin surface. Over a guidewire, a 9-French sheath was advanced into the lumen of the stomach. The wire was left in place as a safety wire. A loop snare device from a percutaneous gastrostomy kit was then advanced into the stomach. A floppy guide wire was advanced through the orogastric catheter under fluoroscopy in the stomach. The loop snare advanced through the percutaneous gastric access was used to snare the guide wire. This allowed withdrawal of the loop snare out of the patient's mouth by retraction of the orogastric catheter and wire. A 20-French bumper retention gastrostomy tube was looped around the snare device. It was then pulled back through the patient's mouth. The retention bumper was brought up to the anterior gastric wall. The T tack suture was cut at the skin. The exiting gastrostomy tube was cut to  appropriate length and a feeding adapter applied. The catheter was injected with contrast material to confirm position and a fluoroscopic spot image saved. The tube was then flushed with saline. A dressing was applied over the gastrostomy exit site. COMPLICATIONS: None. FINDINGS: Initial fluoroscopy demonstrates adequate opacification of the colon by ingested barium in order to prevent colonic injury during the procedure. The stomach distended well with air allowing safe placement of the gastrostomy tube. After placement, the tip of the gastrostomy tube lies in the body of the stomach. IMPRESSION: Percutaneous gastrostomy with placement of a 20-French bumper retention tube in the body of the stomach. This tube can be used for percutaneous feeds beginning as early as 24 hours after placement. Electronically Signed   By: GAletta EdouardM.D.   On: 02/10/2020 13:21   DG Chest Port 1 View  Result Date: 03/02/2020 CLINICAL DATA:  Fever. EXAM: PORTABLE CHEST 1 VIEW COMPARISON:  Nov 15, 2015 FINDINGS: There is  a right-sided venous Port-A-Cath with its distal tip noted at the junction of the superior vena cava and right atrium. Multiple sternal wires are present. A 1.7 cm x 1.1 cm nodular opacity is seen overlying the lateral aspect of the left lung base. This represents a new finding when compared to the prior exam. Very mild left basilar atelectasis is also noted. There is no evidence of a pleural effusion or pneumothorax. The heart size and mediastinal contours are within normal limits. The visualized skeletal structures are unremarkable. IMPRESSION: 1. 1.7 cm x 1.1 cm nodular opacity overlying the left lung base. Correlation with chest CT is recommended to exclude the presence of an underlying lung mass. 2. Very mild left basilar atelectasis. 3. Evidence of prior median sternotomy. Electronically Signed   By: Virgina Norfolk M.D.   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 EXAM: IMPLANTED PORT A CATH PLACEMENT WITH ULTRASOUND AND FLUOROSCOPIC GUIDANCE ANESTHESIA/SEDATION: 1.0 mg IV Versed; 50 mcg IV Fentanyl Total Moderate Sedation Time:  39 minutes The patient's level of consciousness and physiologic status were continuously monitored during the procedure by Radiology nursing. Additional Medications: 2 g IV Ancef. FLUOROSCOPY TIME:  36 seconds.  11.0 mGy. PROCEDURE: The procedure, risks, benefits, and alternatives were explained to the patient. Questions regarding the procedure were encouraged and answered. The patient understands and consents to the procedure. A time-out was performed prior to initiating the procedure. Ultrasound was utilized to confirm patency of the right internal jugular vein. The right neck and chest were prepped with chlorhexidine in a sterile fashion, and a sterile drape was applied covering the operative field. Maximum barrier sterile technique with sterile gowns and gloves were used for the procedure. Local anesthesia was provided with 1% lidocaine. After creating a small venotomy incision, a 21 gauge needle was advanced into the right internal jugular vein under direct, real-time ultrasound guidance. Ultrasound image documentation was performed. After securing guidewire access, an 8 Fr dilator was placed. A J-wire was kinked to measure appropriate catheter length. A subcutaneous port pocket was then created along the upper chest wall utilizing sharp and blunt dissection. Portable cautery was utilized. The pocket was irrigated with sterile saline. A single lumen power injectable port was chosen for placement. The 8 Fr catheter was tunneled from the port pocket site to the venotomy incision. The port was placed in the pocket. External catheter was trimmed to appropriate length based on guidewire measurement. At the venotomy, an 8 Fr peel-away sheath was  placed over a guidewire. The catheter was then placed through the sheath and the sheath removed. Final catheter positioning was confirmed and documented with a fluoroscopic spot image. The port was accessed with a needle and aspirated and flushed with heparinized saline. The access needle was removed. The venotomy and port pocket incisions were closed with subcutaneous 3-0 Monocryl and subcuticular 4-0 Vicryl. Dermabond was applied to both incisions. COMPLICATIONS: COMPLICATIONS None FINDINGS: After catheter placement, the tip lies at the cavo-atrial junction. The catheter aspirates normally and is ready for immediate use. IMPRESSION: Placement of single lumen port a cath via right internal jugular vein. The catheter tip lies at the cavo-atrial junction. A power injectable port a cath was placed and is ready for immediate use. Electronically Signed   By: Aletta Edouard M.D.   On: 02/10/2020 13:15    Assessment:   Byron Peacock is  a 64 y.o. 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  His white blood count is low but he is not neutropenic.  Creatinine is up some LFTs are normal.  Procalcitonin is 16.  Lactic acid was initially 3.8.  Coronavirus test was negative.  Urinalysis was negative.  CT chest showed atelectasis.  He has been started on cefepime Flagyl and Vanco but had a rash develop on his arm.  He is not known to be allergic to any antibiotics.  Flagyl was held. Differential I suspect he is having infection in his oropharynx and mouth given the amount of mucositis and sores from the radiation.  He may be bacteremic from this.  He also has a Port-A-Cath.  No evidence of aspiration or pneumonia.  Recommendations Continue vanco pending cultures Change cefepime and flagyl to zosyn - seems to have had an allergic rx possibly to the the flagyl Monitor cr closely  Check MRSA PCR.   Thank you very much for allowing me to participate in the care of this  patient. Please call with questions.   Cheral Marker. Ola Spurr, MD

## 2020-03-03 NOTE — Progress Notes (Addendum)
Pharmacy Antibiotic Note  Omar Christian is a 64 y.o. male with history of SCC of tonsil on R on chemotherapy admitted on 03/02/2020 with sepsis. Patient with oral lesions and mucositis from radiation and chemotherapy. Patient was initiated on broad spectrum antibiotics with vancomycin/cefepime/metronidazole. However, patient subsequently developed a rash while metronidazole was infusing. ID consulted and has changed antibiotic regimen to vancomycin/Zosyn. Pharmacy has been consulted for vancomycin and Zosyn dosing.  Plan: Zosyn 3.375g q8h IV extended infusion  Adjust vancomycin to 750 mg q12h based on worsening SCr  Daily Scr per protocol. Scr 1.23 >> 1.62 today. Continue to monitor renal function closely while on vancomycin and Zosyn combination.   Weight: 98 kg (216 lb)  Temp (24hrs), Avg:101.6 F (38.7 C), Min:98.8 F (37.1 C), Max:103.3 F (39.6 C)  Recent Labs  Lab 03/02/20 0853 03/02/20 2220 03/03/20 0057 03/03/20 0333 03/03/20 0537 03/03/20 1146  WBC 5.7 3.4*  --   --   --  4.2  CREATININE 1.23 1.25*  --   --   --  1.62*  LATICACIDVEN  --  4.0* 3.8* 2.6* 2.8*  --     Estimated Creatinine Clearance: 57.5 mL/min (A) (by C-G formula based on SCr of 1.62 mg/dL (H)).    No Known Allergies  Antimicrobials this admission: Cefepime 8/31 >> 9/1 Metronidazole 8/31 >> 9/1 Vancomycin 8/31 >>  Zosyn 9/1  >>   Dose adjustments this admission: 9/1: vancomycin adjusted from 1250 mg q12h >> 750 mg q12h based on worsening SCr  Microbiology results: 9/1 BCx: NGTD 9/1 UCx: pending 9/1 MRSA PCR: pending  Thank you for allowing pharmacy to be a part of this patient's care.  Benita Gutter 03/03/2020 1:30 PM

## 2020-03-03 NOTE — Progress Notes (Signed)
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   GOAL:   Patient will meet greater than or equal to 90% of their needs    MONITOR:   PO intake, Supplement acceptance, Weight trends, Labs, I & O's, TF tolerance  REASON FOR ASSESSMENT:   Consult Enteral/tube feeding initiation and management  ASSESSMENT:  RD working remotely.  64 year old male with history of HTN, HLD, CAD s/p CABG, and recently diagnosed with stage IV tonsillar cancer s/p PEG tube placement currently undergoing chemo and radiation presented with fever and chills s/p chemo and radiation treatment this morning admitted for severe sepsis.  Patient is currently in [facility] ED, able to speak with wife via phone this afternoon. Wife reports that patient is tolerating 2-3 cartons [employer] 1.4 via PEG daily. Patients po intake has decreased over the last week, wife recalls pt likes applesauce, chocolate milk, and Ensure supplements. Patient is followed by outpt RD at cancer center, last seen on 8/31.   Per chart, weights have trended down ~13 lbs (5.7%) in the last month; significant. Given trends as well as recent diagnosis of stage IV tonsillar cancer currently undergoing treatment, highly suspect degree of malnutrition, however unable to identify at this time. Will plan to complete  NFPE at follow-up.   Medications reviewed and include: Maxipime, Flagyl, Vancomycin Labs: CBGs 178,163, Na 132 (L), BUN 32 (H), Cr 1.62 (H), Hgb 9.1 (L), HCT 27.4 (L)  NUTRITION - FOCUSED PHYSICAL EXAM: Unable to perform at this time, RD working remotely.  Diet Order:   Diet Order            Diet heart healthy/carb modified Room service appropriate? Yes; Fluid consistency: Thin  Diet effective now                 EDUCATION NEEDS:   Not appropriate for education at this time  Skin:  Skin Assessment: Reviewed RN Assessment  Last BM:  unknown  Height:   Ht Readings from Last 1 Encounters:  03/02/20 6\' 1"  (1.854 m)    Weight:   Wt Readings from Last 1 Encounters:  03/02/20 98 kg    Ideal Body Weight:  83.6 kg  BMI:  Body mass index is 28.5 kg/m.  Estimated Nutritional Needs:   Kcal:  6568-1275  Protein:  125-138  Fluid:  > 2.5 L/day   Lajuan Lines, RD, LDN Clinical Nutrition After Hours/Weekend Pager # in [place]

## 2020-03-03 NOTE — Telephone Encounter (Signed)
No 8/31 los, no changes made to pt schedule

## 2020-03-03 NOTE — Progress Notes (Signed)
Pharmacy Antibiotic Note  Omar Christian is a 64 y.o. male admitted on 03/02/2020 with sepsis.  Pharmacy has been consulted for Vancomycin and Cefepime dosing.  Plan: Cefepime 2gm IV q8hrs Vancomycin 1250mg  IV q12hrs  Weight: 98 kg (216 lb)  Temp (24hrs), Avg:100.2 F (37.9 C), Min:98.5 F (36.9 C), Max:103.3 F (39.6 C)  Recent Labs  Lab 02/25/20 0736 03/02/20 0853 03/02/20 2220 03/03/20 0057  WBC 9.4 5.7 3.4*  --   CREATININE 1.82* 1.23 1.25*  --   LATICACIDVEN  --   --  4.0* 3.8*    Estimated Creatinine Clearance: 74.5 mL/min (A) (by C-G formula based on SCr of 1.25 mg/dL (H)).    No Known Allergies  Antimicrobials this admission:   >>    >>   Dose adjustments this admission:   Microbiology results:  BCx:   UCx:    Sputum:    MRSA PCR:   Thank you for allowing pharmacy to be a part of this patient's care.  Hart Robinsons A 03/03/2020 3:05 AM

## 2020-03-03 NOTE — ED Notes (Signed)
pt with new rash mostly on trunk, c/o being hot and itchy, denies airway swelling. has recieved 28 ml of flagyl. per daughter does not think he has had rash or flagyl before. unsure if this is allergy or from heat but stopped flagyl infusion. MD notified by secure chat. Benadryl ordered and given.

## 2020-03-04 ENCOUNTER — Inpatient Hospital Stay: Payer: 59 | Admitting: General Practice

## 2020-03-04 ENCOUNTER — Ambulatory Visit: Payer: 59

## 2020-03-04 DIAGNOSIS — N289 Disorder of kidney and ureter, unspecified: Secondary | ICD-10-CM | POA: Insufficient documentation

## 2020-03-04 DIAGNOSIS — E860 Dehydration: Secondary | ICD-10-CM | POA: Insufficient documentation

## 2020-03-04 DIAGNOSIS — A419 Sepsis, unspecified organism: Secondary | ICD-10-CM | POA: Diagnosis present

## 2020-03-04 DIAGNOSIS — R6520 Severe sepsis without septic shock: Secondary | ICD-10-CM | POA: Diagnosis present

## 2020-03-04 DIAGNOSIS — C099 Malignant neoplasm of tonsil, unspecified: Secondary | ICD-10-CM

## 2020-03-04 DIAGNOSIS — Z931 Gastrostomy status: Secondary | ICD-10-CM | POA: Insufficient documentation

## 2020-03-04 DIAGNOSIS — R5383 Other fatigue: Secondary | ICD-10-CM | POA: Insufficient documentation

## 2020-03-04 DIAGNOSIS — D6959 Other secondary thrombocytopenia: Secondary | ICD-10-CM | POA: Insufficient documentation

## 2020-03-04 DIAGNOSIS — R634 Abnormal weight loss: Secondary | ICD-10-CM | POA: Insufficient documentation

## 2020-03-04 DIAGNOSIS — E785 Hyperlipidemia, unspecified: Secondary | ICD-10-CM | POA: Insufficient documentation

## 2020-03-04 DIAGNOSIS — C098 Malignant neoplasm of overlapping sites of tonsil: Secondary | ICD-10-CM | POA: Insufficient documentation

## 2020-03-04 DIAGNOSIS — Z79899 Other long term (current) drug therapy: Secondary | ICD-10-CM | POA: Insufficient documentation

## 2020-03-04 DIAGNOSIS — Z5111 Encounter for antineoplastic chemotherapy: Secondary | ICD-10-CM | POA: Insufficient documentation

## 2020-03-04 DIAGNOSIS — Z951 Presence of aortocoronary bypass graft: Secondary | ICD-10-CM | POA: Insufficient documentation

## 2020-03-04 DIAGNOSIS — B379 Candidiasis, unspecified: Secondary | ICD-10-CM | POA: Insufficient documentation

## 2020-03-04 DIAGNOSIS — I10 Essential (primary) hypertension: Secondary | ICD-10-CM | POA: Insufficient documentation

## 2020-03-04 DIAGNOSIS — T451X5A Adverse effect of antineoplastic and immunosuppressive drugs, initial encounter: Secondary | ICD-10-CM | POA: Insufficient documentation

## 2020-03-04 LAB — BASIC METABOLIC PANEL
Anion gap: 11 (ref 5–15)
BUN: 40 mg/dL — ABNORMAL HIGH (ref 8–23)
CO2: 26 mmol/L (ref 22–32)
Calcium: 7.3 mg/dL — ABNORMAL LOW (ref 8.9–10.3)
Chloride: 95 mmol/L — ABNORMAL LOW (ref 98–111)
Creatinine, Ser: 1.76 mg/dL — ABNORMAL HIGH (ref 0.61–1.24)
GFR calc Af Amer: 47 mL/min — ABNORMAL LOW (ref >60)
GFR calc non Af Amer: 40 mL/min — ABNORMAL LOW (ref >60)
Glucose, Bld: 161 mg/dL — ABNORMAL HIGH (ref 70–99)
Potassium: 4.2 mmol/L (ref 3.5–5.1)
Sodium: 132 mmol/L — ABNORMAL LOW (ref 135–145)

## 2020-03-04 LAB — URINE CULTURE: Culture: NO GROWTH

## 2020-03-04 LAB — CBC
HCT: 27.1 % — ABNORMAL LOW (ref 39.0–52.0)
Hemoglobin: 8.9 g/dL — ABNORMAL LOW (ref 13.0–17.0)
MCH: 27.6 pg (ref 26.0–34.0)
MCHC: 32.8 g/dL (ref 30.0–36.0)
MCV: 83.9 fL (ref 80.0–100.0)
Platelets: 70 10*3/uL — ABNORMAL LOW (ref 150–400)
RBC: 3.23 MIL/uL — ABNORMAL LOW (ref 4.22–5.81)
RDW: 14.3 % (ref 11.5–15.5)
WBC: 3.2 10*3/uL — ABNORMAL LOW (ref 4.0–10.5)
nRBC: 0 % (ref 0.0–0.2)

## 2020-03-04 LAB — GLUCOSE, CAPILLARY
Glucose-Capillary: 159 mg/dL — ABNORMAL HIGH (ref 70–99)
Glucose-Capillary: 148 mg/dL — ABNORMAL HIGH (ref 70–99)
Glucose-Capillary: 181 mg/dL — ABNORMAL HIGH (ref 70–99)
Glucose-Capillary: 153 mg/dL — ABNORMAL HIGH (ref 70–99)
Glucose-Capillary: 153 mg/dL — ABNORMAL HIGH (ref 70–99)

## 2020-03-04 LAB — MRSA PCR SCREENING: MRSA by PCR: NEGATIVE

## 2020-03-04 MED ORDER — GUAIFENESIN-DM 100-10 MG/5ML PO SYRP
5.0000 mL | ORAL_SOLUTION | ORAL | Status: DC | PRN
  Filled 2020-03-04: qty 5

## 2020-03-04 MED ORDER — GUAIFENESIN-DM 100-10 MG/5ML PO SYRP: 5.0000 mL | ORAL_SOLUTION | ORAL | 0 refills | Status: DC | PRN

## 2020-03-04 MED ORDER — INSULIN ASPART 100 UNIT/ML ~~LOC~~ SOLN
0.0000 [IU] | Freq: Three times a day (TID) | SUBCUTANEOUS | Status: DC
  Administered 2020-03-04: 2 [IU] via SUBCUTANEOUS
  Filled 2020-03-04: qty 1

## 2020-03-04 MED ORDER — SODIUM CHLORIDE 0.9 % IV SOLN: Freq: Once | INTRAVENOUS | Status: AC

## 2020-03-04 MED ORDER — INSULIN ASPART 100 UNIT/ML ~~LOC~~ SOLN: 0.0000 [IU] | Freq: Every day | SUBCUTANEOUS | Status: DC

## 2020-03-04 MED ORDER — AMOXICILLIN-POT CLAVULANATE 875-125 MG PO TABS: 1.0000 | ORAL_TABLET | Freq: Two times a day (BID) | ORAL | 0 refills | Status: AC

## 2020-03-04 NOTE — Progress Notes (Signed)
[facility] [place] NAME: Omar Christian    MR#:  [id number]  DATE OF BIRTH:  [birth year]  SUBJECTIVE:  CHIEF COMPLAINT:   Chief Complaint  Patient presents with  . Code Sepsis  Very hard of hearing.  Wife at bedside providing most of the information.  No further fever.  Overall improving REVIEW OF SYSTEMS:  Review of Systems  Constitutional: Negative for diaphoresis, malaise/fatigue and weight loss.  HENT: Positive for hearing loss. Negative for ear discharge, ear pain, nosebleeds, sore throat and tinnitus.   Eyes: Negative for blurred vision and pain.  Respiratory: Negative for cough, hemoptysis, shortness of breath and wheezing.   Cardiovascular: Negative for chest pain, palpitations, orthopnea and leg swelling.  Gastrointestinal: Negative for abdominal pain, blood in stool, constipation, diarrhea, heartburn, nausea and vomiting.  Genitourinary: Negative for dysuria, frequency and urgency.  Musculoskeletal: Negative for back pain and myalgias.  Skin: Negative for itching and rash.  Neurological: Negative for dizziness, tingling, tremors, focal weakness, seizures, weakness and headaches.  Psychiatric/Behavioral: Negative for depression. The patient is not nervous/anxious.    DRUG ALLERGIES:  No Known Allergies VITALS:  Blood pressure 108/63, pulse (!) 40, temperature 99.1 F (37.3 C), temperature source Oral, resp. rate 20, weight 98 kg, SpO2 97 %. PHYSICAL EXAMINATION:  Physical Exam HENT:     Head: Normocephalic and atraumatic.     Mouth/Throat:     Mouth: Oral lesions present.     Tongue: Lesions present.     Palate: Lesions present.     Comments: Multiple oral sores on the roof of his mouth and on the tongue Eyes:     Conjunctiva/sclera: Conjunctivae normal.     Pupils: Pupils are equal, round, and reactive to light.  Neck:     Thyroid: No thyromegaly.     Trachea: No tracheal deviation.  Cardiovascular:     Rate and Rhythm:  Normal rate and regular rhythm.     Heart sounds: Normal heart sounds.  Pulmonary:     Effort: Pulmonary effort is normal. No respiratory distress.     Breath sounds: Normal breath sounds. No wheezing.  Chest:     Chest wall: No tenderness.     Comments: Port-A-Cath on right chest wall Abdominal:     General: Bowel sounds are normal. There is no distension.     Palpations: Abdomen is soft.     Tenderness: There is no abdominal tenderness.  Musculoskeletal:        General: Normal range of motion.     Cervical back: Normal range of motion and neck supple.  Skin:    General: Skin is warm and dry.     Findings: No rash.  Neurological:     Mental Status: He is alert and oriented to person, place, and time.     Cranial Nerves: No cranial nerve deficit.    LABORATORY PANEL:  Male CBC Recent Labs  Lab 03/04/20 0411  WBC 3.2*  HGB 8.9*  HCT 27.1*  PLT 70*   ------------------------------------------------------------------------------------------------------------------ Chemistries  Recent Labs  Lab 03/02/20 2220 03/02/20 2220 03/03/20 1146 03/03/20 1146 03/04/20 0411  NA 132*   < > 132*   < > 132*  K 4.3   < > 4.4   < > 4.2  CL 98   < > 100   < > 95*  CO2 23   < > 21*   < > 26  GLUCOSE 232*   < >  254*   < > 161*  BUN 22   < > 32*   < > 40*  CREATININE 1.25*   < > 1.62*   < > 1.76*  CALCIUM 8.6*   < > 8.0*   < > 7.3*  MG 1.7  --   --   --   --   AST 31   < > 35  --   --   ALT 26   < > 31  --   --   ALKPHOS 90   < > 61  --   --   BILITOT 0.7   < > 1.1  --   --    < > = values in this interval not displayed.   RADIOLOGY:  No results found. ASSESSMENT AND PLAN:  64 year old male with a known history of hypertension, hyperlipidemia, coronary artery disease status post CABG, stage IV tonsillar cancer on chemo with PEG tube, difficulty hearing, gout, GERD is admitted for sepsis  Severe sepsis present on admission Could be oral source considering significant mucositis  and source from radiation No evidence of pneumonia on CT chest, no urine infection on UA Continue empiric Zosyn for now, vancomycin stopped. Appreciate ID input -if he remains afebrile with continued ongoing clinical improvement can likely discharge him tomorrow on oral Augmentin for total 7 days per ID  Pancytopenia Likely secondary to chemotherapy and/or sepsis Monitor counts  Oral thrush/candidiasis Continue nystatin swish and swallow  Poor nutrition -Continue tube feeds per home regimen Dietitian following  Acute kidney injury Likely from ATN in the setting of sepsis Continue treatment of sepsis and IV fluids and monitor kidney function.  Avoid nephrotoxic medications   Body mass index is 28.5 kg/m.      Status is: Inpatient  Remains inpatient appropriate because:Ongoing diagnostic testing needed not appropriate for outpatient work up   Dispo: The patient is from: Home              Anticipated d/c is to: Home              Anticipated d/c date is: 1 day              Patient currently is not medically stable to d/c.  Will monitor him overnight for any clinical worsening and or fever.  If he continues to improve likely should be able to discharge on oral Augmentin.  Wife in agreement       DVT prophylaxis:            Place and maintain sequential compression device Start: 03/03/20 0729     Family Communication: Updated wife [name] at bedside.  Her cell phone number is [phone]   All the records are reviewed and case discussed with Care Management/Social Worker. Management plans discussed with the patient, family and they are in agreement.  CODE STATUS: Full Code  TOTAL TIME TAKING CARE OF THIS PATIENT: 35 minutes.   More than 50% of the time was spent in counseling/coordination of care: YES  POSSIBLE D/C IN 1-2 DAYS, DEPENDING ON CLINICAL CONDITION.   Max Sane M.D on 03/04/2020 at 9:37 PM  [facility]   CC: Primary care physician; Margo Common, PA  Note: This dictation was prepared with Dragon dictation along with smaller phrase technology. Any transcriptional errors that result from this process are unintentional.

## 2020-03-04 NOTE — ED Notes (Signed)
Dr. Manuella Ghazi in room to assess patient.  Will continue to monitor.

## 2020-03-04 NOTE — Progress Notes (Signed)
[facility] PROGRESS NOTE Date of Admission:  03/02/2020     ID: Omar Christian is a 64 y.o. male with fever during chemo rad Active Problems:   H/O: gout   HLD (hyperlipidemia)   Benign hypertension   CAD, multiple vessel   S/P CABG x 4   Essential (primary) hypertension   Tonsillar cancer (HCC)   Sepsis (HCC)   Severe sepsis (HCC)   Subjective: No fevers, chills. Feels much better with still with oral pain   ROS  Eleven systems are reviewed and negative except per hpi  Medications:  Antibiotics Given (last 72 hours)    Date/Time Action Medication Dose Rate   03/02/20 2251 New Bag/Given   ceFEPIme (MAXIPIME) 2 g in sodium chloride 0.9 % 100 mL IVPB 2 g 200 mL/hr   03/02/20 2254 New Bag/Given   metroNIDAZOLE (FLAGYL) IVPB 500 mg 500 mg 100 mL/hr   03/02/20 2314 New Bag/Given   vancomycin (VANCOCIN) IVPB 1000 mg/200 mL premix 1,000 mg 200 mL/hr   03/03/20 0757 New Bag/Given   metroNIDAZOLE (FLAGYL) IVPB 500 mg 500 mg 100 mL/hr   03/03/20 0919 New Bag/Given   ceFEPIme (MAXIPIME) 2 g in sodium chloride 0.9 % 100 mL IVPB 2 g 200 mL/hr   03/03/20 1013 New Bag/Given   vancomycin (VANCOREADY) IVPB 1250 mg/250 mL 1,250 mg 166.7 mL/hr   03/03/20 1524 New Bag/Given   piperacillin-tazobactam (ZOSYN) IVPB 3.375 g 3.375 g 12.5 mL/hr   03/03/20 2138 New Bag/Given   piperacillin-tazobactam (ZOSYN) IVPB 3.375 g 3.375 g 12.5 mL/hr   03/04/20 0535 New Bag/Given   piperacillin-tazobactam (ZOSYN) IVPB 3.375 g 3.375 g 12.5 mL/hr   03/04/20 1103 New Bag/Given   vancomycin (VANCOREADY) IVPB 750 mg/150 mL 750 mg 150 mL/hr   03/04/20 1525 New Bag/Given   piperacillin-tazobactam (ZOSYN) IVPB 3.375 g 3.375 g 12.5 mL/hr     . allopurinol  100 mg Oral Daily  . atorvastatin  20 mg Oral Daily  . feeding supplement (ENSURE ENLIVE)  237 mL Oral TID BM  . feeding supplement (KATE FARMS STANDARD 1.4)  325 mL Per Tube QID  . free water  240 mL Per Tube Q6H  . insulin aspart   0-5 Units Subcutaneous QHS  . insulin aspart  0-9 Units Subcutaneous TID WC  . metoprolol succinate  25 mg Oral Daily  . nystatin  5 mL Oral QID  . sodium chloride flush  3 mL Intravenous Q12H    Objective: Vital signs in last 24 hours: Pulse Rate:  [52-100] 81 (09/02 1430) Resp:  [15-30] 17 (09/02 1430) BP: (100-142)/(51-82) 121/66 (09/02 1158) SpO2:  [90 %-97 %] 94 % (09/02 1430) Constitutional: He is oriented to person, place, and time. He appears well-developed and well-nourished. No distress.  HENT: Mucous membranes are dry.  He has multiple oral sores on his tongue and the roof of his mouth.  Lymphadenopathy on the right at the site of his malignancy. Cardiovascular: Normal rate, regular rhythm and normal heart sounds. Exam reveals no gallop and no friction rub.  No murmur heard.  Pulmonary/Chest: Effort normal and breath sounds normal. No respiratory distress. He has no wheezes.  Right chest wall with Port-A-Cath.  Within normal limits. Abdominal: Soft. Bowel sounds are normal. He exhibits no distension. There is no tenderness.  PEG tube in the abdomen within normal limits. Lymphadenopathy: He has no cervical adenopathy.  Neurological: He is alert and oriented to person, place, and time.  Skin: Skin is warm and  dry. No rash noted. No erythema.  Psychiatric: He has a normal mood and affect. His behavior is normal.    Lab Results Recent Labs    03/03/20 1146 03/04/20 0411  WBC 4.2 3.2*  HGB 9.1* 8.9*  HCT 27.4* 27.1*  NA 132* 132*  K 4.4 4.2  CL 100 95*  CO2 21* 26  BUN 32* 40*  CREATININE 1.62* 1.76*    Microbiology: @micro @ Studies/Results: CT Angio Chest PE W and/or Wo Contrast  Result Date: 03/03/2020 CLINICAL DATA:  Tonsillar cancer currently undergoing chemotherapy, fever EXAM: CT ANGIOGRAPHY CHEST WITH CONTRAST TECHNIQUE: Multidetector CT imaging of the chest was performed using the standard protocol during bolus administration of intravenous contrast.  Multiplanar CT image reconstructions and MIPs were obtained to evaluate the vascular anatomy. CONTRAST:  41mL OMNIPAQUE IOHEXOL 350 MG/ML SOLN COMPARISON:  03/02/2020, 01/26/2020 FINDINGS: Cardiovascular: This is a technically adequate evaluation of the pulmonary vasculature. No filling defects or pulmonary emboli. Postsurgical changes are seen from previous coronary bypass surgery. No pericardial effusion. Aneurysmal dilatation of the ascending thoracic aorta measures approximately 4.7 cm just above the sino-tubular junction. No evidence of dissection. Mediastinum/Nodes: No enlarged mediastinal, hilar, or axillary lymph nodes. Thyroid gland, trachea, and esophagus demonstrate no significant findings. Lungs/Pleura: Hypoventilatory changes are noted within the lingula and bilateral lower lobes. No acute airspace disease, effusion, or pneumothorax. Central airways are patent. A 4 mm subpleural right middle lobe nodule reference image 204 of series 5 is unchanged since 2017 and benign. No other pulmonary nodules or masses. Upper Abdomen: No acute abnormality. Musculoskeletal: Right chest wall port is identified via internal jugular approach, tip within the superior vena cava. There are no acute or destructive bony lesions. Reconstructed images demonstrate no additional findings. Review of the MIP images confirms the above findings. IMPRESSION: 1. No evidence of pulmonary embolus. 2. Bibasilar consolidation consistent with atelectasis. This accounts for the chest x-ray finding. 3. 4.7 cm ascending thoracic aortic aneurysm, previously measuring approximately 4.5 cm. Recommend semi-annual imaging followup by CTA or MRA and referral to cardiothoracic surgery if not already obtained. This recommendation follows 2010 ACCF/AHA/AATS/ACR/ASA/SCA/SCAI/SIR/STS/SVM Guidelines for the Diagnosis and Management of Patients With Thoracic Aortic Disease. Circulation. 2010; 121: Q947-M546. Aortic aneurysm NOS (ICD10-I71.9)  Electronically Signed   By: Randa Ngo M.D.   On: 03/03/2020 00:42   DG Chest Port 1 View  Result Date: 03/02/2020 CLINICAL DATA:  Fever. EXAM: PORTABLE CHEST 1 VIEW COMPARISON:  Nov 15, 2015 FINDINGS: There is a right-sided venous Port-A-Cath with its distal tip noted at the junction of the superior vena cava and right atrium. Multiple sternal wires are present. A 1.7 cm x 1.1 cm nodular opacity is seen overlying the lateral aspect of the left lung base. This represents a new finding when compared to the prior exam. Very mild left basilar atelectasis is also noted. There is no evidence of a pleural effusion or pneumothorax. The heart size and mediastinal contours are within normal limits. The visualized skeletal structures are unremarkable. IMPRESSION: 1. 1.7 cm x 1.1 cm nodular opacity overlying the left lung base. Correlation with chest CT is recommended to exclude the presence of an underlying lung mass. 2. Very mild left basilar atelectasis. 3. Evidence of prior median sternotomy. Electronically Signed   By: Virgina Norfolk M.D.   On: 03/02/2020 22:53    Assessment/Plan: Omar Christian is a 64 y.o. 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  His white blood count is low  but he is not neutropenic.  Creatinine is up some LFTs are normal.  Procalcitonin is 16.  Lactic acid was initially 3.8.  Coronavirus test was negative.  Urinalysis was negative.  CT chest showed atelectasis.  He has been started on cefepime Flagyl and Vanco but had a rash develop on his arm.  He is not known to be allergic to any antibiotics.  Flagyl was held. Differential I suspect he is having infection in his oropharynx and mouth given the amount of mucositis and sores from the radiation.  He may be bacteremic from this.  He also has a Port-A-Cath.  No evidence of aspiration or pneumonia.  9/2 much improved. No fever, bcx neg. MRSA PCR neg   Recommendations DC  vanco   Cont  Zosyn. If improving and bcx neg would dc on oral augmentin to treat for 7 days total abx with close otpt followup.  Thank you very much for the consult. Will follow with you.  Leonel Ramsay   03/04/2020, 4:08 PM

## 2020-03-04 NOTE — ED Notes (Signed)
Pt given sprite to go with his ensure as he requested. Pt denies any other needs currently. Pt watching tv.

## 2020-03-04 NOTE — ED Notes (Signed)
This RN to bedside as pt set off call bell. Upon entrance to room noted pt standing at bedside having unplugged himself from monitor in order to walk to restroom. Pt had loose BM and urinated. Performed peri care himself. Pt back in bed and plugged back into monitor. Bed locked low. Rail up. Call bell within reach.

## 2020-03-04 NOTE — ED Notes (Signed)
Pt up to bedside toilet urinating. Steady.

## 2020-03-04 NOTE — Progress Notes (Signed)
Inpatient Diabetes Program Recommendations  AACE/ADA: New Consensus Statement on Inpatient Glycemic Control   Target Ranges:  Prepandial:   less than 140 mg/dL      Peak postprandial:   less than 180 mg/dL (1-2 hours)      Critically ill patients:  140 - 180 mg/dL   Results for Omar Christian, Omar Christian (MRN [id number]) as of 03/04/2020 12:14  Ref. Range 03/03/2020 05:42 03/03/2020 21:39 03/04/2020 01:55 03/04/2020 09:01 03/04/2020 12:01  Glucose-Capillary Latest Ref Range: 70 - 99 mg/dL 178 (H) 211 (H) 159 (H) 148 (H) 181 (H)   Review of Glycemic Control  Diabetes history: No Outpatient Diabetes medications: NA Current orders for Inpatient glycemic control: None; Ensure 237 ml TID between meals  Inpatient Diabetes Program Recommendations:    Insulin: Please consider ordering Novolog 0-9 units Q4H for correction.  NOTE: Patient admitted with sepsis with stage IV tonsillar cancer currently undergoing chemotherapy,and noted to receive nutrition via PEG tube.  Also noted Decadron 8 mg daily (take daily for 3 days after chemo) on home medication list.  Thanks, Barnie Alderman, RN, MSN, CDE Diabetes Coordinator Inpatient Diabetes Program [phone] (Team Pager from 8am to 5pm)

## 2020-03-04 NOTE — Progress Notes (Signed)
[facility] CSW Progress Notes  Call to wife to check in re needs/progress.  She is at patient bedside in [facility] ED but planning to go home to get some sleep.  She has been with him since his ED admission.  Encouraged her to go home and sleep.  Spoke w daughter [name].  She, wife and sister are actively supporting patient - encourage regular tube feeding and all other activities needed for him to do as well as possible with treatment.  He has significant family support through this process.  Family is working w [employer] to file for disability - he has paperwork that needs to be witnessed and returned.  Daughter will make sure this process is completed so he can have some income as he is unable to work at this time.  CSW will call again in two weeks to check in.  Edwyna Shell, LCSW Clinical Social Worker Phone:  [phone] Cell:  [phone]

## 2020-03-04 NOTE — ED Notes (Signed)
Pt switched from Ed stretcher to hospital bed at this time. Patient given fresh blankets and placed in clean hospital gown. Patient comfortable at this time, VSS and NAD noted. Will continue to monitor.

## 2020-03-04 NOTE — ED Notes (Signed)
Attending Manuella Ghazi notified of BG results.

## 2020-03-04 NOTE — ED Notes (Signed)
BG 153. Unsure why glucometer not willing to transfer data over to EHR.

## 2020-03-05 ENCOUNTER — Ambulatory Visit: Payer: 59

## 2020-03-05 DIAGNOSIS — R000 Tachycardia, unspecified: Secondary | ICD-10-CM

## 2020-03-05 DIAGNOSIS — R509 Fever, unspecified: Secondary | ICD-10-CM

## 2020-03-05 LAB — BASIC METABOLIC PANEL
Anion gap: 10 (ref 5–15)
BUN: 38 mg/dL — ABNORMAL HIGH (ref 8–23)
CO2: 27 mmol/L (ref 22–32)
Calcium: 8.3 mg/dL — ABNORMAL LOW (ref 8.9–10.3)
Chloride: 94 mmol/L — ABNORMAL LOW (ref 98–111)
Creatinine, Ser: 1.81 mg/dL — ABNORMAL HIGH (ref 0.61–1.24)
GFR calc Af Amer: 45 mL/min — ABNORMAL LOW (ref >60)
GFR calc non Af Amer: 39 mL/min — ABNORMAL LOW (ref >60)
Glucose, Bld: 130 mg/dL — ABNORMAL HIGH (ref 70–99)
Potassium: 3.5 mmol/L (ref 3.5–5.1)
Sodium: 131 mmol/L — ABNORMAL LOW (ref 135–145)

## 2020-03-05 LAB — CBC
HCT: 26.8 % — ABNORMAL LOW (ref 39.0–52.0)
Hemoglobin: 9.1 g/dL — ABNORMAL LOW (ref 13.0–17.0)
MCH: 27.3 pg (ref 26.0–34.0)
MCHC: 34 g/dL (ref 30.0–36.0)
MCV: 80.5 fL (ref 80.0–100.0)
Platelets: 86 10*3/uL — ABNORMAL LOW (ref 150–400)
RBC: 3.33 MIL/uL — ABNORMAL LOW (ref 4.22–5.81)
RDW: 14.6 % (ref 11.5–15.5)
WBC: 2.1 10*3/uL — ABNORMAL LOW (ref 4.0–10.5)
nRBC: 0 % (ref 0.0–0.2)

## 2020-03-05 MED ORDER — HEPARIN SOD (PORK) LOCK FLUSH 10 UNIT/ML IV SOLN
10.0000 [IU] | INTRAVENOUS | Status: AC
  Administered 2020-03-05: 10 [IU] via INTRAVENOUS
  Filled 2020-03-05: qty 1

## 2020-03-05 MED ORDER — MAGIC MOUTHWASH W/LIDOCAINE: 5.0000 mL | Freq: Every day | ORAL | 1 refills | Status: DC | PRN

## 2020-03-05 MED ORDER — SODIUM CHLORIDE 0.9 % IV SOLN: Freq: Once | INTRAVENOUS | Status: AC

## 2020-03-05 NOTE — Progress Notes (Signed)
Pharmacy Antibiotic Note  Omar Christian is a 64 y.o. male with history of SCC of tonsil on R on chemotherapy admitted on 03/02/2020 with sepsis. Patient with oral lesions and mucositis from radiation and chemotherapy. Patient was initiated on broad spectrum antibiotics with vancomycin/cefepime/metronidazole. However, patient subsequently developed a rash while metronidazole was infusing. ID consulted and has changed antibiotic regimen to vancomycin/Zosyn. Pharmacy has been consulted for vancomycin and Zosyn dosing.  Vanc d/c- see ID note.  Plan: Day 3- Zosyn 3.375g q8h IV extended infusion    Height: 6' (182.9 cm) Weight: 98.6 kg (217 lb 6 oz) IBW/kg (Calculated) : 77.6  Temp (24hrs), Avg:98.7 F (37.1 C), Min:98.3 F (36.8 C), Max:99.1 F (37.3 C)  Recent Labs  Lab 03/02/20 0853 03/02/20 2220 03/03/20 0057 03/03/20 0333 03/03/20 0537 03/03/20 1146 03/04/20 0411 03/05/20 0529  WBC 5.7 3.4*  --   --   --  4.2 3.2* 2.1*  CREATININE 1.23 1.25*  --   --   --  1.62* 1.76* 1.81*  LATICACIDVEN  --  4.0* 3.8* 2.6* 2.8*  --   --   --     Estimated Creatinine Clearance: 50.8 mL/min (A) (by C-G formula based on SCr of 1.81 mg/dL (H)).    No Known Allergies  Antimicrobials this admission: Cefepime 8/31 >> 9/1 Metronidazole 8/31 >> 9/1 Vancomycin 8/31 >> 9/2 Zosyn 9/1  >>   Dose adjustments this admission: 9/1: vancomycin adjusted from 1250 mg q12h >> 750 mg q12h based on worsening SCr  Microbiology results: 9/1 BCx: NGTD 9/1 UCx: pending 9/1 MRSA PCR: pending  Thank you for allowing pharmacy to be a part of this patient's care.  Frederica Chrestman A 03/05/2020 10:33 AM

## 2020-03-05 NOTE — Discharge Instructions (Signed)
Fever, Adult     A fever is an increase in your body's temperature. It often means a temperature of 100.4F (38C) or higher. Brief mild or moderate fevers often have no long-term effects. They often do not need treatment. Moderate or high fevers may make you feel uncomfortable. Sometimes, they can be a sign of a serious illness or disease. A fever that keeps coming back or that lasts a long time may cause you to lose water in your body (get dehydrated). You can take your temperature with a thermometer to see if you have a fever. Temperature can change with:  Age.  Time of day.  Where the thermometer is put in the body. Readings may vary when the thermometer is put: ? In the mouth (oral). ? In the butt (rectal). ? In the ear (tympanic). ? Under the arm (axillary). ? On the forehead (temporal). Follow these instructions at home: Medicines  Take over-the-counter and prescription medicines only as told by your doctor. Follow the dosing instructions carefully.  If you were prescribed an antibiotic medicine, take it as told by your doctor. Do not stop taking it even if you start to feel better. General instructions  Watch for any changes in your symptoms. Tell your doctor about them.  Rest as needed.  Drink enough fluid to keep your pee (urine) pale yellow.  Sponge yourself or bathe with room-temperature water as needed. This helps to lower your body temperature. Do not use ice water.  Do not use too many blankets or wear clothes that are too heavy.  If your fever was caused by an infection that spreads from person to person (is contagious), such as a cold or the flu: ? You should stay home from work and public places for at least 24 hours after your fever is gone. ? Your fever should be gone for at least 24 hours without the need to use medicines. Contact a doctor if:  You throw up (vomit).  You cannot eat or drink without throwing up.  You have watery poop (diarrhea).  It  hurts when you pee.  Your symptoms do not get better with treatment.  You have new symptoms.  You feel very weak. Get help right away if:  You are short of breath or have trouble breathing.  You are dizzy or you pass out (faint).  You feel mixed up (confused).  You have signs of not having enough water in your body, such as: ? Dark pee, very little pee, or no pee. ? Cracked lips. ? Dry mouth. ? Sunken eyes. ? Sleepiness. ? Weakness.  You have very bad pain in your belly (abdomen).  You keep throwing up or having watery poop.  You have a rash on your skin.  Your symptoms get worse all of a sudden. Summary  A fever is an increase in your body's temperature. It often means a temperature of 100.4F (38C) or higher.  Watch for any changes in your symptoms. Tell your doctor about them.  Take all medicines only as told by your doctor.  Do not go to work or other public places if your fever was caused by an illness that can spread to other people.  Get help right away if you have signs that you do not have enough water in your body. This information is not intended to replace advice given to you by your health care provider. Make sure you discuss any questions you have with your health care provider. Document Revised: 12/03/2017   Document Reviewed: 12/03/2017 Elsevier Patient Education  2020 Elsevier Inc.  

## 2020-03-05 NOTE — Progress Notes (Signed)
Omar Christian to be D/C'd home with wife per MD order.  Discussed prescriptions and follow up appointments with the patient. Prescriptions given to patient, medication list explained in detail. Pt verbalized understanding.  Allergies as of 03/05/2020   No Known Allergies      Medication List     STOP taking these medications    acetaminophen 650 MG CR tablet Commonly known as: TYLENOL   colchicine 0.6 MG tablet       TAKE these medications    allopurinol 100 MG tablet Commonly known as: ZYLOPRIM Take 1 tablet (100 mg total) by mouth daily.   amoxicillin-clavulanate 875-125 MG tablet Commonly known as: Augmentin Take 1 tablet by mouth every 12 (twelve) hours for 7 days.   atorvastatin 20 MG tablet Commonly known as: LIPITOR Take 1 tablet (20 mg total) by mouth daily.   dexamethasone 4 MG tablet Commonly known as: DECADRON Take 2 tablets (8 mg total) by mouth daily. Take daily for 3 days after chemo. Take with food.   guaiFENesin-dextromethorphan 100-10 MG/5ML syrup Commonly known as: ROBITUSSIN DM Take 5 mLs by mouth every 4 (four) hours as needed for cough.   [employer] Standard 1.4 Liqd 488 mLs by Gastrostomy Tube route 4 (four) times daily.   lidocaine 2 % solution Commonly known as: XYLOCAINE Patient: Mix 1part 2% viscous lidocaine, 1part H20. Swish & swallow 23mL of diluted mixture, 34min before eating and at bedtime, up to 5xs daily.   lidocaine-prilocaine cream Commonly known as: EMLA Apply to affected area once   LORazepam 0.5 MG tablet Commonly known as: Ativan Take 1 tablet (0.5 mg total) by mouth at bedtime as needed (Nausea or vomiting).   magic mouthwash w/lidocaine Soln Take 5 mLs by mouth 5 (five) times daily as needed for mouth pain. What changed: when to take this   metoprolol succinate 25 MG 24 hr tablet Commonly known as: Toprol XL Take 1 tablet (25 mg total) by mouth daily.   nystatin 100000 UNIT/ML suspension Commonly known as:  MYCOSTATIN Take 5 mLs (500,000 Units total) by mouth in the morning, at noon, and at bedtime.   ondansetron 8 MG tablet Commonly known as: Zofran Take 1 tablet (8 mg total) by mouth 2 (two) times daily as needed. Start on the third day after chemotherapy.   oxyCODONE-acetaminophen 5-325 MG tablet Commonly known as: PERCOCET/ROXICET Take 1 tablet by mouth every 4 (four) hours as needed.   prochlorperazine 10 MG tablet Commonly known as: COMPAZINE Take 1 tablet (10 mg total) by mouth every 6 (six) hours as needed (Nausea or vomiting).   sodium fluoride 1.1 % Crea dental cream Commonly known as: PreviDent 5000 Plus Apply to tooth brush. Brush teeth for 2 minutes. Spit out excess-DO NOT swallow. DO NOT rinse afterwards. Repeat nightly.        Vitals:   03/04/20 2014 03/05/20 0408  BP: 108/63 110/69  Pulse: (!) 40 78  Resp: 20 20  Temp: 99.1 F (37.3 C) 98.3 F (36.8 C)  SpO2: 97% 97%    Skin clean, dry and intact without evidence of skin break down, no evidence of skin tears noted. IV catheter discontinued intact. Site without signs and symptoms of complications. Dressing and pressure applied. Pt denies pain at this time. No complaints noted.  An After Visit Summary was printed and given to the patient. Patient escorted via [place], and D/C home via private auto.  [place] A Ndeye Tenorio

## 2020-03-05 NOTE — TOC Transition Note (Signed)
Transition of Care [facility]) - CM/SW Discharge Note   Patient Details  Name: Omar Christian MRN: [id number] Date of Birth: [birth year]  Transition [facility]) CM/SW Contact:  Candie Chroman, LCSW Phone Number: 03/05/2020, 9:12 AM   Clinical Narrative:  Readmission prevention screen complete. CSW met with patient. No supports at bedside. CSW introduced role and explained that discharge planning would be discussed. PCP is [facility], [place]. His daughters transport him to appointments. Pharmacy is [facility] in [place]. No issues obtaining medications. Patient did not have home health prior to admission and stated he does not need it. Patient did not use DME prior to admission. No further concerns. Patient has orders to discharge home today. He said his wife will take him home. CSW signing off.   Final next level of care: Home/Self Care Barriers to Discharge: No Barriers Identified   Patient Goals and CMS Choice     Choice offered to / list presented to : NA  Discharge Placement                       Discharge Plan and Services     Post Acute Care Choice: NA                               Social Determinants of Health (SDOH) Interventions     Readmission Risk Interventions Readmission Risk Prevention Plan 03/05/2020  Transportation Screening Complete  PCP or Specialist Appt within 3-5 Days Complete  HRI or [place] Patient refused  Social Work Consult for [place] Planning/Counseling Complete  Palliative Care Screening Not Applicable  Medication Review Press photographer) Complete  Some recent data might be hidden

## 2020-03-05 NOTE — Progress Notes (Signed)
Oncology Nurse Navigator Documentation  I left a voicemail with [name] (Mr. Seidman daughter). I offered support and assistance if needed regarding Mr. Urschel's recent hospitalization. I informed her that we are looking forward to seeing Mr. Vines back for his radiation treatments starting 9/7 after his recent break due to his hospitalization. I asked for him to come early on 9/7 so that he can see Dr. Isidore Moos before his radiation appointment for assessment. I received a phone call back from [place] and they are agreeable to come early on 9/7 to see Dr. Isidore Moos. They know to call me if they have any further questions or concerns.

## 2020-03-06 ENCOUNTER — Telehealth: Payer: Self-pay | Admitting: *Deleted

## 2020-03-06 ENCOUNTER — Inpatient Hospital Stay: Payer: 59

## 2020-03-06 NOTE — Telephone Encounter (Signed)
PC to pt's daughter [name], noted in reviewing chart that pt had been in hospital, [place] yesterday.  Daughter states that he received fluids while admitted, will not be coming to [facility] for fluids today.

## 2020-03-07 LAB — CULTURE, BLOOD (ROUTINE X 2)
Culture: NO GROWTH
Culture: NO GROWTH

## 2020-03-08 ENCOUNTER — Telehealth: Payer: Self-pay

## 2020-03-08 NOTE — Telephone Encounter (Signed)
Transition Care Management Follow-up Telephone Call  Date of discharge and from where: [facility] on 03/05/20  How have you been since you were released from the hospital? Per wife pt is doing ok. Pt is not eating orally and is getting his meals/nutrients from the g-tube. Pt will drink small amounts of water, pepsi and an electrolyte drink if he feels nausea. Pt has been nausea intermittenly and vomited 3 times since being home. Wife is unsure the cause other than starting on Amoxicillin. Pt has been having diarrhea or soft stools once a day. Per wife this has been off and on since he was dx with cancer. Declined pain, fever or SOB.   Any questions or concerns? Wife is concerned as to why the nausea has increased recently.   Items Reviewed:  Did the pt receive and understand the discharge instructions provided? Yes   Medications obtained and verified? Per wife- daughter manages medications and she is unable to recall the names of the medications.   Any new allergies since your discharge? No   Dietary orders reviewed? Yes  Do you have support at home? Yes   Other (ie: DME, Home Health, etc): N/A  Functional Questionnaire: (I = Independent and D = Dependent)  Bathing/Dressing- I   Meal Prep- I  Eating- Has a G-tube.  Maintaining continence- Currently unable to control his bowels.   Transferring/Ambulation- I  Managing Meds- D, wife and daughter manage medications.    Follow up appointments reviewed:    PCP Hospital f/u appt confirmed? Yes  scheduled to see Vernie Murders on 03/15/20 @ 3:20 PM.  [facility] f/u appt confirmed? N/A   Are transportation arrangements needed? No   If their condition worsens, is the pt aware to call  their PCP or go to the ED? Yes  Was the patient provided with contact information for the PCP's office or ED? Yes  Was the pt encouraged to call back with questions or concerns? Yes

## 2020-03-08 NOTE — Telephone Encounter (Signed)
HFU scheduled with PCP on 03/15/20 @ 3:20 PM.

## 2020-03-09 ENCOUNTER — Telehealth: Payer: Self-pay | Admitting: Family Medicine

## 2020-03-09 ENCOUNTER — Telehealth: Payer: Self-pay | Admitting: *Deleted

## 2020-03-09 ENCOUNTER — Inpatient Hospital Stay: Payer: 59 | Admitting: Nutrition

## 2020-03-09 ENCOUNTER — Other Ambulatory Visit: Payer: Self-pay | Admitting: Hematology and Oncology

## 2020-03-09 ENCOUNTER — Inpatient Hospital Stay: Payer: 59

## 2020-03-09 ENCOUNTER — Other Ambulatory Visit: Payer: Self-pay

## 2020-03-09 ENCOUNTER — Ambulatory Visit
Admission: RE | Admit: 2020-03-09 | Discharge: 2020-03-09 | Disposition: A | Discharge status: Home / Self Care | Payer: 59 | Source: Ambulatory Visit | Attending: Radiation Oncology | Admitting: Radiation Oncology

## 2020-03-09 VITALS — BP 144/57 | HR 50 | Temp 98.4°F | Resp 18

## 2020-03-09 DIAGNOSIS — R1313 Dysphagia, pharyngeal phase: Secondary | ICD-10-CM | POA: Insufficient documentation

## 2020-03-09 DIAGNOSIS — R471 Dysarthria and anarthria: Secondary | ICD-10-CM | POA: Diagnosis present

## 2020-03-09 DIAGNOSIS — R41841 Cognitive communication deficit: Secondary | ICD-10-CM | POA: Diagnosis present

## 2020-03-09 DIAGNOSIS — C099 Malignant neoplasm of tonsil, unspecified: Secondary | ICD-10-CM

## 2020-03-09 LAB — CBC WITH DIFFERENTIAL (CANCER CENTER ONLY)
Abs Immature Granulocytes: 0.01 10*3/uL (ref 0.00–0.07)
Basophils Absolute: 0 10*3/uL (ref 0.0–0.1)
Basophils Relative: 1 %
Eosinophils Absolute: 0 10*3/uL (ref 0.0–0.5)
Eosinophils Relative: 1 %
HCT: 28.8 % — ABNORMAL LOW (ref 39.0–52.0)
Hemoglobin: 9.7 g/dL — ABNORMAL LOW (ref 13.0–17.0)
Immature Granulocytes: 1 %
Lymphocytes Relative: 29 %
Lymphs Abs: 0.6 10*3/uL — ABNORMAL LOW (ref 0.7–4.0)
MCH: 26.7 pg (ref 26.0–34.0)
MCHC: 33.7 g/dL (ref 30.0–36.0)
MCV: 79.3 fL — ABNORMAL LOW (ref 80.0–100.0)
Monocytes Absolute: 0.3 10*3/uL (ref 0.1–1.0)
Monocytes Relative: 15 %
Neutro Abs: 1.1 10*3/uL — ABNORMAL LOW (ref 1.7–7.7)
Neutrophils Relative %: 53 %
Platelet Count: 107 10*3/uL — ABNORMAL LOW (ref 150–400)
RBC: 3.63 MIL/uL — ABNORMAL LOW (ref 4.22–5.81)
RDW: 13.7 % (ref 11.5–15.5)
WBC Count: 2 10*3/uL — ABNORMAL LOW (ref 4.0–10.5)
nRBC: 0 % (ref 0.0–0.2)

## 2020-03-09 LAB — CMP (CANCER CENTER ONLY)
ALT: 29 U/L (ref 0–44)
AST: 17 U/L (ref 15–41)
Albumin: 3.2 g/dL — ABNORMAL LOW (ref 3.5–5.0)
Alkaline Phosphatase: 89 U/L (ref 38–126)
Anion gap: 10 (ref 5–15)
BUN: 29 mg/dL — ABNORMAL HIGH (ref 8–23)
CO2: 28 mmol/L (ref 22–32)
Calcium: 8.9 mg/dL (ref 8.9–10.3)
Chloride: 97 mmol/L — ABNORMAL LOW (ref 98–111)
Creatinine: 1.38 mg/dL — ABNORMAL HIGH (ref 0.61–1.24)
GFR, Est AFR Am: >60 mL/min (ref >60)
GFR, Estimated: 54 mL/min — ABNORMAL LOW (ref >60)
Glucose, Bld: 154 mg/dL — ABNORMAL HIGH (ref 70–99)
Potassium: 3.5 mmol/L (ref 3.5–5.1)
Sodium: 135 mmol/L (ref 135–145)
Total Bilirubin: 0.4 mg/dL (ref 0.3–1.2)
Total Protein: 6.4 g/dL — ABNORMAL LOW (ref 6.5–8.1)

## 2020-03-09 LAB — MAGNESIUM: Magnesium: 1.1 mg/dL — CL (ref 1.7–2.4)

## 2020-03-09 MED ORDER — SODIUM CHLORIDE 0.9 % IV SOLN
Freq: Once | INTRAVENOUS | Status: AC
  Filled 2020-03-09: qty 250

## 2020-03-09 MED ORDER — MAGNESIUM SULFATE 2 GM/50ML IV SOLN
2.0000 g | Freq: Once | INTRAVENOUS | Status: AC
  Administered 2020-03-09: 2 g via INTRAVENOUS

## 2020-03-09 MED ORDER — POTASSIUM CHLORIDE 10 MEQ/100ML IV SOLN
10.0000 meq | Freq: Once | INTRAVENOUS | Status: AC
  Administered 2020-03-09: 10 meq via INTRAVENOUS

## 2020-03-09 MED ORDER — POTASSIUM CHLORIDE 10 MEQ/100ML IV SOLN
INTRAVENOUS | Status: AC
  Filled 2020-03-09: qty 100

## 2020-03-09 MED ORDER — MAGNESIUM SULFATE 2 GM/50ML IV SOLN
INTRAVENOUS | Status: AC
  Filled 2020-03-09: qty 50

## 2020-03-09 MED ORDER — HEPARIN SOD (PORK) LOCK FLUSH 100 UNIT/ML IV SOLN
500.0000 [IU] | Freq: Once | INTRAVENOUS | Status: AC
  Administered 2020-03-09: 500 [IU] via INTRAVENOUS
  Filled 2020-03-09: qty 5

## 2020-03-09 MED ORDER — SODIUM CHLORIDE 0.9% FLUSH
10.0000 mL | Freq: Once | INTRAVENOUS | Status: AC
  Administered 2020-03-09: 10 mL via INTRAVENOUS
  Filled 2020-03-09: qty 10

## 2020-03-09 NOTE — Progress Notes (Signed)
Nutrition follow up completed with patient during infusion. He is receiving concurrent chemoradiation for Tonsil cancer. Weight was improved to 217.37 pounds on Sept 3 from 211 pounds on August 27. Labs: Glucose 154, BUN 29, Creatinine 1.38, Albumin 3.2, Magnesium 1.1. Patient denies problems with tube feeding. Reports occasional Nausea and vomiting. Reports he is having bowel movements. He is drinking beverages by mouth. Tolerating [employer] 1.4 via G-tube, 1 carton 4 times daily 4 hours apart with 60 mL water before and after bolus feeding.  Give 240 mL free water flushes 4 times daily between feedings. Increase to 1.5 cartons [employer] 1.4 4 times daily to provide 2730 kcal, 120 gm protein, and 2844 mL water.  Estimated nutrition needs: 2400-2700 cal, 115-130 g protein, 2.6 L fluid.  Nutrition diagnosis: Unintended weight loss improved.  Intervention: Increase [employer] 1.4 to goal rate of 1-1/2 cartons 4 times daily to provide 2730 cal, 120 g protein and 1404 mL free water.  Continue same free water flushes. Total free water intake with water flushes is 2844 mL. Continue antiemetics as needed.  Monitoring, Evaluation, Goals: Patient will continue to tolerate oral intake with tube feeding to minimize weight loss.  Next Visit: Tuesday, Sept 14 during infusion.

## 2020-03-09 NOTE — Discharge Summary (Signed)
Omar Christian at [place] NAME: Omar Christian    MR#:  [id number]  DATE OF BIRTH:  1955/10/26  DATE OF ADMISSION:  03/02/2020   ADMITTING PHYSICIAN: Clarnce Flock, MD  DATE OF DISCHARGE: 03/05/2020 11:54 AM  PRIMARY CARE PHYSICIAN: Chrismon, Vickki Muff, PA   ADMISSION DIAGNOSIS:  Tachycardia [R00.0] Thrombocytopenia (HCC) [D69.6] Lactic acid acidosis [E87.2] Hyperglycemia [R73.9] Acute respiratory failure with hypoxia (HCC) [J96.01] Troponin I above reference range [R77.8] Patient on antineoplastic chemotherapy regimen [Z79.899] Severe sepsis ([place]) [A41.9, R65.20] Fever, unspecified fever cause [R50.9] Sepsis, due to unspecified organism, unspecified whether acute organ dysfunction present ([place]) [A41.9] DISCHARGE DIAGNOSIS:  Active Problems:   H/O: gout   HLD (hyperlipidemia)   Benign hypertension   CAD, multiple vessel   S/P CABG x 4   Essential (primary) hypertension   Tonsillar cancer (HCC)   Sepsis (HCC)   Severe sepsis (HCC)   Fever   Tachycardia  SECONDARY DIAGNOSIS:   Past Medical History:  Diagnosis Date  . Abnormal findings on cardiac catheterization 09/30/15   [facility]  . Abnormal myocardial perfusion study 09/24/15   [facility]  . CAD, multiple vessel 09/30/15   per CATH @ [facility]/DR.PARACHOS  . Coronary artery disease   . GERD (gastroesophageal reflux disease)   . Gout   . H/O echocardiogram 09/24/15   [place]  . Hard of hearing   . History of pneumonia   . Hyperlipidemia   . Hypertension   . Pneumonia   . Progressive angina ([place])   . 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 Appreciate ID input - recommended oral Augmentin for total 7 days at D/C  Pancytopenia Likely secondary to chemotherapy and/or sepsis stable  Oral thrush/candidiasis Continue nystatin swish and swallow  Poor nutrition -Continue tube feeds per home regimen  Acute kidney injury Likely from ATN in the setting of sepsis Improved with hydration  DISCHARGE CONDITIONS:  stable CONSULTS OBTAINED:   DRUG ALLERGIES:  No Known Allergies DISCHARGE MEDICATIONS:   Allergies as of 03/05/2020   No Known Allergies     Medication List    STOP taking these medications   acetaminophen 650 MG CR tablet Commonly known as: TYLENOL   colchicine 0.6 MG tablet     TAKE these medications   allopurinol 100 MG tablet Commonly known as: ZYLOPRIM Take 1 tablet (100 mg total) by mouth daily.   amoxicillin-clavulanate 875-125 MG tablet Commonly known as: Augmentin Take 1 tablet by mouth every 12 (twelve) hours for 7 days.   atorvastatin 20 MG tablet Commonly known as: LIPITOR Take 1 tablet (20 mg total) by mouth daily.   dexamethasone 4 MG tablet Commonly known as: DECADRON Take 2 tablets (8 mg total) by mouth daily. Take daily for 3 days after chemo. Take with food.   guaiFENesin-dextromethorphan 100-10 MG/5ML syrup Commonly known as: ROBITUSSIN DM Take 5 mLs by mouth every 4 (four) hours as needed for cough.   [facility] Standard 1.4 Liqd 488  mLs by Gastrostomy Tube route 4 (four) times daily.   lidocaine 2 % solution Commonly known as: XYLOCAINE Patient: Mix 1part 2% viscous lidocaine, 1part H20. Swish & swallow 16mL of diluted mixture, 79min before eating and at bedtime, up to 5xs daily.   lidocaine-prilocaine cream Commonly known as: EMLA Apply to affected area once   LORazepam 0.5 MG tablet Commonly known as:  Ativan Take 1 tablet (0.5 mg total) by mouth at bedtime as needed (Nausea or vomiting).   magic mouthwash w/lidocaine Soln Take 5 mLs by mouth 5 (five) times daily as needed for mouth pain. What changed: when to take this   metoprolol succinate 25 MG 24 hr tablet Commonly known as: Toprol XL Take 1 tablet (25 mg total) by mouth daily.   nystatin 100000 UNIT/ML suspension Commonly known as: MYCOSTATIN Take 5 mLs (500,000 Units total) by mouth in the morning, at noon, and at bedtime.   ondansetron 8 MG tablet Commonly known as: Zofran Take 1 tablet (8 mg total) by mouth 2 (two) times daily as needed. Start on the third day after chemotherapy.   oxyCODONE-acetaminophen 5-325 MG tablet Commonly known as: PERCOCET/ROXICET Take 1 tablet by mouth every 4 (four) hours as needed.   prochlorperazine 10 MG tablet Commonly known as: COMPAZINE Take 1 tablet (10 mg total) by mouth every 6 (six) hours as needed (Nausea or vomiting).   sodium fluoride 1.1 % Crea dental cream Commonly known as: PreviDent 5000 Plus Apply to tooth brush. Brush teeth for 2 minutes. Spit out excess-DO NOT swallow. DO NOT rinse afterwards. Repeat nightly.      DISCHARGE INSTRUCTIONS:   DIET:  Regular diet/Tube Feeds DISCHARGE CONDITION:  Stable ACTIVITY:  Activity as tolerated OXYGEN:  Home Oxygen: No.  Oxygen Delivery: room air DISCHARGE LOCATION:  home   If you experience worsening of your admission symptoms, develop shortness of breath, life threatening emergency, suicidal or homicidal thoughts you must seek medical attention immediately by calling 911 or calling your MD immediately  if symptoms less severe.  You Must read complete instructions/literature along with all the possible adverse reactions/side effects for all the Medicines you take and that have been prescribed to you. Take any new Medicines after you have completely understood and accpet all the possible adverse reactions/side effects.    Please note  You were cared for by a hospitalist during your hospital stay. If you have any questions about your discharge medications or the care you received while you were in the hospital after you are discharged, you can call the unit and asked to speak with the hospitalist on call if the hospitalist that took care of you is not available. Once you are discharged, your primary care physician will handle any further medical issues. Please note that NO REFILLS for any discharge medications will be authorized once you are discharged, as it is imperative that you return to your primary care physician (or establish a relationship with a primary care physician if you do not have one) for your aftercare needs so that they can reassess your need for medications and monitor your lab values.    On the day of Discharge:  VITAL SIGNS:  Blood pressure 110/69, pulse 78, temperature 98.3 F (36.8 C), temperature source Oral, resp. rate 20, height 6' (1.829 m), weight 98.6 kg, SpO2 97 %. PHYSICAL EXAMINATION:  GENERAL:  64 y.o.-year-old patient lying in the bed with no acute distress.  EYES: Pupils equal, round, reactive to light and accommodation. No scleral  icterus. Extraocular muscles intact.  HEENT: Head atraumatic, normocephalic. Oropharynx and nasopharynx clear.  NECK:  Supple, no jugular venous distention. No thyroid enlargement, no tenderness.  LUNGS: Normal breath sounds bilaterally, no wheezing, rales,rhonchi or crepitation. No use of accessory muscles of respiration.  CARDIOVASCULAR: S1, S2 normal. No murmurs, rubs, or gallops.  ABDOMEN: Soft, non-tender, non-distended. Bowel sounds present. No organomegaly or mass.  EXTREMITIES: No pedal edema, cyanosis, or clubbing.  NEUROLOGIC: Cranial nerves II through XII are intact. Muscle strength 5/5 in all extremities. Sensation intact. Gait not checked.  PSYCHIATRIC: The patient is alert and oriented x 3.  SKIN: No obvious rash, lesion, or ulcer.   DATA REVIEW:   CBC Recent Labs  Lab 03/09/20 0910  WBC 2.0*  HGB 9.7*  HCT 28.8*  PLT 107*    Chemistries  Recent Labs  Lab 03/09/20 0910  NA 135  K 3.5  CL 97*  CO2 28  GLUCOSE 154*  BUN 29*  CREATININE 1.38*  CALCIUM 8.9  MG 1.1*  AST 17  ALT 29  ALKPHOS 89  BILITOT 0.4     Outpatient follow-up  Follow-up Information    Chrismon, Vickki Muff, PA. Go on 03/15/2020.   Specialty: Family Medicine Why: at 3:20pm for hospital follow-up Contact information: [address] [place] [address] [phone]                Management plans discussed with the patient, family and they are in agreement.  CODE STATUS: Prior   TOTAL TIME TAKING CARE OF THIS PATIENT: 45 minutes.    Max Sane M.D on 03/09/2020 at 11:28 AM  [facility]   CC: Primary care physician; Margo Common, PA   Note: This dictation was prepared with Dragon dictation along with smaller phrase technology. Any transcriptional errors that result from this process are unintentional.

## 2020-03-09 NOTE — Telephone Encounter (Signed)
Received call from [place] lab with critical lab, Magnesium 1.1. Reported to Dr Geralyn Flash RN/Liz who will let Dr Lindi Adie know.

## 2020-03-09 NOTE — Progress Notes (Signed)
Spoke w/ Dr. Lindi Adie when pt arrived this morning. He ordered labs (CBC, CMP and Manesium). He gave a verbal order to proceed with Potassium and IVF as ordered, but wait for Magnesium level before releasing the magnesium that was ordered under the supportive treatment plan. Dr. Geralyn Flash nurse called with Mg level  1.1. Dr. Lindi Adie was made aware and Mag released from treatment plan.

## 2020-03-09 NOTE — Telephone Encounter (Signed)
° °  SF 03/09/2020    Name: Omar Christian    MRN: [id number]    DOB: 1955-12-20    AGE: 64 y.o.    GENDER: male    PCP Chrismon, Vickki Muff, PA.    Spoke with patient's daughter [name] regarding Hospital Follow Up appointment on Sept. 13, 2021. [name] stated that her father wants to cancel appointment so there will be no need for transportation for that day. She stated they will give the office a call to reschedule appointment. Cancelled appointment for 03/15/2020 at 3:20pm  Closing referral pending any other needs of patient.   [place], Care Management Phone: [phone] Email: [email or web address]

## 2020-03-09 NOTE — Progress Notes (Signed)
CRITICAL VALUE STICKER  CRITICAL VALUE: Magnesium 1.1  MD NOTIFIED: Dr. Lindi Adie   TIME OF NOTIFICATION: 1035  RESPONSE: Orders placed for 2 Grams of IV Magnesium.

## 2020-03-10 ENCOUNTER — Other Ambulatory Visit: Payer: Self-pay | Admitting: Hematology and Oncology

## 2020-03-10 ENCOUNTER — Other Ambulatory Visit: Payer: Self-pay

## 2020-03-10 ENCOUNTER — Ambulatory Visit
Admission: RE | Admit: 2020-03-10 | Discharge: 2020-03-10 | Disposition: A | Discharge status: Home / Self Care | Payer: 59 | Source: Ambulatory Visit | Attending: Radiation Oncology | Admitting: Radiation Oncology

## 2020-03-10 DIAGNOSIS — R1313 Dysphagia, pharyngeal phase: Secondary | ICD-10-CM | POA: Diagnosis not present

## 2020-03-11 ENCOUNTER — Telehealth: Payer: Self-pay | Admitting: Nutrition

## 2020-03-11 ENCOUNTER — Ambulatory Visit
Admission: RE | Admit: 2020-03-11 | Discharge: 2020-03-11 | Disposition: A | Discharge status: Home / Self Care | Payer: 59 | Source: Ambulatory Visit | Attending: Radiation Oncology | Admitting: Radiation Oncology

## 2020-03-11 ENCOUNTER — Telehealth: Payer: Self-pay | Admitting: *Deleted

## 2020-03-11 ENCOUNTER — Other Ambulatory Visit: Payer: Self-pay

## 2020-03-11 DIAGNOSIS — R1313 Dysphagia, pharyngeal phase: Secondary | ICD-10-CM | POA: Diagnosis not present

## 2020-03-11 NOTE — Telephone Encounter (Signed)
Patient's wife contacted me by telephone.  Patient has had nausea and vomiting for the past 2 days.  She has not been giving nausea medication.  Patient does not feel well.  Enforced directions for nausea medication per MD prescription.  Encouraged her to reach out to nursing and MD tomorrow morning when he comes in for radiation if patient is not improved and tolerating feedings.  Enforced importance of waiting 30 minutes after nausea medication is given before trying one half carton of tube feeding.  Teach back method used. Note forwarded to Nurse Navigator and MD.

## 2020-03-11 NOTE — Telephone Encounter (Signed)
Per contact with Threasa Beards- this RN scheduled pt for 1 hour IVF in am post radiation therapy. Called and left message with pt per his cell number.

## 2020-03-12 ENCOUNTER — Telehealth: Payer: Self-pay | Admitting: *Deleted

## 2020-03-12 ENCOUNTER — Ambulatory Visit
Admission: RE | Admit: 2020-03-12 | Discharge: 2020-03-12 | Disposition: A | Discharge status: Home / Self Care | Payer: 59 | Source: Ambulatory Visit | Attending: Radiation Oncology | Admitting: Radiation Oncology

## 2020-03-12 ENCOUNTER — Other Ambulatory Visit: Payer: Self-pay

## 2020-03-12 ENCOUNTER — Other Ambulatory Visit: Payer: Self-pay | Admitting: *Deleted

## 2020-03-12 ENCOUNTER — Inpatient Hospital Stay: Payer: 59

## 2020-03-12 DIAGNOSIS — C098 Malignant neoplasm of overlapping sites of tonsil: Secondary | ICD-10-CM

## 2020-03-12 DIAGNOSIS — R1313 Dysphagia, pharyngeal phase: Secondary | ICD-10-CM | POA: Diagnosis not present

## 2020-03-12 MED ORDER — HEPARIN SOD (PORK) LOCK FLUSH 100 UNIT/ML IV SOLN
500.0000 [IU] | Freq: Once | INTRAVENOUS | Status: AC
  Administered 2020-03-12: 500 [IU] via INTRAVENOUS
  Filled 2020-03-12: qty 5

## 2020-03-12 MED ORDER — SODIUM CHLORIDE 0.9% FLUSH
10.0000 mL | INTRAVENOUS | Status: DC | PRN
  Administered 2020-03-12: 10 mL via INTRAVENOUS
  Filled 2020-03-12: qty 10

## 2020-03-12 MED ORDER — ONDANSETRON HCL 4 MG/2ML IJ SOLN
8.0000 mg | Freq: Once | INTRAMUSCULAR | Status: AC
  Administered 2020-03-12: 8 mg via INTRAVENOUS

## 2020-03-12 MED ORDER — ONDANSETRON HCL 4 MG/2ML IJ SOLN
INTRAMUSCULAR | Status: AC
  Filled 2020-03-12: qty 4

## 2020-03-12 MED ORDER — SODIUM CHLORIDE 0.9 % IV SOLN
INTRAVENOUS | Status: DC
  Filled 2020-03-12 (×2): qty 250

## 2020-03-12 MED ORDER — SODIUM CHLORIDE 0.9 % IV SOLN: 8.0000 mg | Freq: Once | INTRAVENOUS | Status: DC

## 2020-03-12 NOTE — Telephone Encounter (Signed)
This RN was informed of pulse today of 46 bpm per pt getting IVF- noted review of chart and pulse readings- pt has ranged from 101 to 46 bpm with continued noted decline since diagnosis and therapy of tonsilar cancer.  Per MD review- recommended evaluation by cardiologist.  Per chart review noted pt has CAD with CABG x 4 in 2017 - called and discussed with pt and his wife need for further evaluation.  Pt is currently under the care of Dr Saralyn Pilar at the [facility] ( now part of [facility] ).  This RN discussed above and that request for an appointment.  Pt and wife verbalized understanding of plan.  This RN contacted Dr Saralyn Pilar office at [phone] at 248 pm and obtained VM stating office is currently closed for the day.  This RN will follow up on Monday per above.

## 2020-03-12 NOTE — Progress Notes (Signed)
Apical pulse 42 and regular. Pt asymptomatic.

## 2020-03-12 NOTE — Patient Instructions (Signed)
Dehydration, Adult Dehydration is a condition in which there is not enough water or other fluids in the body. This happens when a person loses more fluids than he or she takes in. Important organs, such as the kidneys, brain, and heart, cannot function without a proper amount of fluids. Any loss of fluids from the body can lead to dehydration. Dehydration can be mild, moderate, or severe. It should be treated right away to prevent it from becoming severe. What are the causes? Dehydration may be caused by:  Conditions that cause loss of water or other fluids, such as diarrhea, vomiting, or sweating or urinating a lot.  Not drinking enough fluids, especially when you are ill or doing activities that require a lot of energy.  Other illnesses and conditions, such as fever or infection.  Certain medicines, such as medicines that remove excess fluid from the body (diuretics).  Lack of safe drinking water.  Not being able to get enough water and food. What increases the risk? The following factors may make you more likely to develop this condition:  Having a long-term (chronic) illness that has not been treated properly, such as diabetes, heart disease, or kidney disease.  Being 65 years of age or older.  Having a disability.  Living in a place that is high in altitude, where thinner, drier air causes more fluid loss.  Doing exercises that put stress on your body for a long time (endurance sports). What are the signs or symptoms? Symptoms of dehydration depend on how severe it is. Mild or moderate dehydration  Thirst.  Dry lips or dry mouth.  Dizziness or light-headedness, especially when standing up from a seated position.  Muscle cramps.  Dark urine. Urine may be the color of tea.  Less urine or tears produced than usual.  Headache. Severe dehydration  Changes in skin. Your skin may be cold and clammy, blotchy, or pale. Your skin also may not return to normal after being  lightly pinched and released.  Little or no tears, urine, or sweat.  Changes in vital signs, such as rapid breathing and low blood pressure. Your pulse may be weak or may be faster than 100 beats a minute when you are sitting still.  Other changes, such as: ? Feeling very thirsty. ? Sunken eyes. ? Cold hands and feet. ? Confusion. ? Being very tired (lethargic) or having trouble waking from sleep. ? Short-term weight loss. ? Loss of consciousness. How is this diagnosed? This condition is diagnosed based on your symptoms and a physical exam. You may have blood and urine tests to help confirm the diagnosis. How is this treated? Treatment for this condition depends on how severe it is. Treatment should be started right away. Do not wait until dehydration becomes severe. Severe dehydration is an emergency and needs to be treated in a hospital.  Mild or moderate dehydration can be treated at home. You may be asked to: ? Drink more fluids. ? Drink an oral rehydration solution (ORS). This drink helps restore proper amounts of fluids and salts and minerals in the blood (electrolytes).  Severe dehydration can be treated: ? With IV fluids. ? By correcting abnormal levels of electrolytes. This is often done by giving electrolytes through a tube that is passed through your nose and into your stomach (nasogastric tube, or NG tube). ? By treating the underlying cause of dehydration. Follow these instructions at home: Oral rehydration solution If told by your health care provider, drink an ORS:  Make   an ORS by following instructions on the package.  Start by drinking small amounts, about  cup (120 mL) every 5-10 minutes.  Slowly increase how much you drink until you have taken the amount recommended by your health care provider. Eating and drinking         Drink enough clear fluid to keep your urine pale yellow. If you were told to drink an ORS, finish the ORS first and then start slowly  drinking other clear fluids. Drink fluids such as: ? Water. Do not drink only water. Doing that can lead to hyponatremia, which is having too little salt (sodium) in the body. ? Water from ice chips you suck on. ? Fruit juice that you have added water to (diluted fruit juice). ? Low-calorie sports drinks.  Eat foods that contain a healthy balance of electrolytes, such as bananas, oranges, potatoes, tomatoes, and spinach.  Do not drink alcohol.  Avoid the following: ? Drinks that contain a lot of sugar. These include high-calorie sports drinks, fruit juice that is not diluted, and soda. ? Caffeine. ? Foods that are greasy or contain a lot of fat or sugar. General instructions  Take over-the-counter and prescription medicines only as told by your health care provider.  Do not take sodium tablets. Doing that can lead to having too much sodium in the body (hypernatremia).  Return to your normal activities as told by your health care provider. Ask your health care provider what activities are safe for you.  Keep all follow-up visits as told by your health care provider. This is important. Contact a health care provider if:  You have muscle cramps, pain, or discomfort, such as: ? Pain in your abdomen and the pain gets worse or stays in one area (localizes). ? Stiff neck.  You have a rash.  You are more irritable than usual.  You are sleepier or have a harder time waking than usual.  You feel weak or dizzy.  You feel very thirsty. Get help right away if you have:  Any symptoms of severe dehydration.  Symptoms of vomiting, such as: ? You cannot eat or drink without vomiting. ? Vomiting gets worse or does not go away. ? Vomit includes blood or green matter (bile).  Symptoms that get worse with treatment.  A fever.  A severe headache.  Problems with urination or bowel movements, such as: ? Diarrhea that gets worse or does not go away. ? Blood in your stool (feces). This  may cause stool to look black and tarry. ? Not urinating, or urinating only a small amount of very dark urine, within 6-8 hours.  Trouble breathing. These symptoms may represent a serious problem that is an emergency. Do not wait to see if the symptoms will go away. Get medical help right away. Call your local emergency services (911 in the [place] Do not drive yourself to the hospital. Summary  Dehydration is a condition in which there is not enough water or other fluids in the body. This happens when a person loses more fluids than he or she takes in.  Treatment for this condition depends on how severe it is. Treatment should be started right away. Do not wait until dehydration becomes severe.  Drink enough clear fluid to keep your urine pale yellow. If you were told to drink an oral rehydration solution (ORS), finish the ORS first and then start slowly drinking other clear fluids.  Take over-the-counter and prescription medicines only as told by your health care   provider.  Get help right away if you have any symptoms of severe dehydration. This information is not intended to replace advice given to you by your health care provider. Make sure you discuss any questions you have with your health care provider. Document Revised: 01/30/2019 Document Reviewed: 01/30/2019 Elsevier Patient Education  2020 [employer]   

## 2020-03-13 ENCOUNTER — Inpatient Hospital Stay: Payer: 59

## 2020-03-13 VITALS — BP 119/59 | HR 50 | Temp 98.4°F | Resp 18

## 2020-03-13 DIAGNOSIS — C099 Malignant neoplasm of tonsil, unspecified: Secondary | ICD-10-CM

## 2020-03-13 DIAGNOSIS — R1313 Dysphagia, pharyngeal phase: Secondary | ICD-10-CM | POA: Diagnosis not present

## 2020-03-13 MED ORDER — POTASSIUM CHLORIDE 10 MEQ/100ML IV SOLN
INTRAVENOUS | Status: AC
  Filled 2020-03-13: qty 100

## 2020-03-13 MED ORDER — SODIUM CHLORIDE 0.9 % IV SOLN
Freq: Once | INTRAVENOUS | Status: AC
  Filled 2020-03-13: qty 250

## 2020-03-13 MED ORDER — POTASSIUM CHLORIDE 10 MEQ/100ML IV SOLN
10.0000 meq | Freq: Once | INTRAVENOUS | Status: AC
  Administered 2020-03-13: 10 meq via INTRAVENOUS

## 2020-03-13 MED ORDER — MAGNESIUM SULFATE 2 GM/50ML IV SOLN
INTRAVENOUS | Status: AC
  Filled 2020-03-13: qty 50

## 2020-03-13 MED ORDER — MAGNESIUM SULFATE 2 GM/50ML IV SOLN
2.0000 g | Freq: Once | INTRAVENOUS | Status: AC
  Administered 2020-03-13: 2 g via INTRAVENOUS

## 2020-03-13 MED ORDER — SODIUM CHLORIDE 0.9% FLUSH
10.0000 mL | INTRAVENOUS | Status: DC | PRN
  Administered 2020-03-13: 10 mL via INTRAVENOUS
  Filled 2020-03-13: qty 10

## 2020-03-13 MED ORDER — HEPARIN SOD (PORK) LOCK FLUSH 100 UNIT/ML IV SOLN
500.0000 [IU] | Freq: Once | INTRAVENOUS | Status: AC
  Administered 2020-03-13: 500 [IU] via INTRAVENOUS
  Filled 2020-03-13: qty 5

## 2020-03-13 NOTE — Patient Instructions (Signed)
Hypokalemia Hypokalemia means that the amount of potassium in the blood is lower than normal. Potassium is a chemical (electrolyte) that helps regulate the amount of fluid in the body. It also stimulates muscle tightening (contraction) and helps nerves work properly. Normally, most of the body's potassium is inside cells, and only a very small amount is in the blood. Because the amount in the blood is so small, minor changes to potassium levels in the blood can be life-threatening. What are the causes? This condition may be caused by:  Antibiotic medicine.  Diarrhea or vomiting. Taking too much of a medicine that helps you have a bowel movement (laxative) can cause diarrhea and lead to hypokalemia.  Chronic kidney disease (CKD).  Medicines that help the body get rid of excess fluid (diuretics).  Eating disorders, such as bulimia.  Low magnesium levels in the body.  Sweating a lot. What are the signs or symptoms? Symptoms of this condition include:  Weakness.  Constipation.  Fatigue.  Muscle cramps.  Mental confusion.  Skipped heartbeats or irregular heartbeat (palpitations).  Tingling or numbness. How is this diagnosed? This condition is diagnosed with a blood test. How is this treated? This condition may be treated by:  Taking potassium supplements by mouth.  Adjusting the medicines that you take.  Eating more foods that contain a lot of potassium. If your potassium level is very low, you may need to get potassium through an IV and be monitored in the hospital. Follow these instructions at home:   Take over-the-counter and prescription medicines only as told by your health care provider. This includes vitamins and supplements.  Eat a healthy diet. A healthy diet includes fresh fruits and vegetables, whole grains, healthy fats, and lean proteins.  If instructed, eat more foods that contain a lot of potassium. This includes: ? Nuts, such as peanuts and  pistachios. ? Seeds, such as sunflower seeds and pumpkin seeds. ? Peas, lentils, and lima beans. ? Whole grain and bran cereals and breads. ? Fresh fruits and vegetables, such as apricots, avocado, bananas, cantaloupe, kiwi, oranges, tomatoes, asparagus, and potatoes. ? Orange juice. ? Tomato juice. ? Red meats. ? Yogurt.  Keep all follow-up visits as told by your health care provider. This is important. Contact a health care provider if you:  Have weakness that gets worse.  Feel your heart pounding or racing.  Vomit.  Have diarrhea.  Have diabetes (diabetes mellitus) and you have trouble keeping your blood sugar (glucose) in your target range. Get help right away if you:  Have chest pain.  Have shortness of breath.  Have vomiting or diarrhea that lasts for more than 2 days.  Faint. Summary  Hypokalemia means that the amount of potassium in the blood is lower than normal.  This condition is diagnosed with a blood test.  Hypokalemia may be treated by taking potassium supplements, adjusting the medicines that you take, or eating more foods that are high in potassium.  If your potassium level is very low, you may need to get potassium through an IV and be monitored in the hospital. This information is not intended to replace advice given to you by your health care provider. Make sure you discuss any questions you have with your health care provider. Document Revised: 01/30/2018 Document Reviewed: 01/30/2018 Elsevier Patient Education  [place]. Hypomagnesemia Hypomagnesemia is a condition in which the level of magnesium in the blood is low. Magnesium is a mineral that is found in many foods. It is  used in many different processes in the body. Hypomagnesemia can affect every organ in the body. In severe cases, it can cause life-threatening problems. What are the causes? This condition may be caused by:  Not getting enough magnesium in your  diet.  Malnutrition.  Problems with absorbing magnesium from the intestines.  Dehydration.  Alcohol abuse.  Vomiting.  Severe or chronic diarrhea.  Some medicines, including medicines that make you urinate more (diuretics).  Certain diseases, such as kidney disease, diabetes, celiac disease, and overactive thyroid. What are the signs or symptoms? Symptoms of this condition include:  Loss of appetite.  Nausea and vomiting.  Involuntary shaking or trembling of a body part (tremor).  Muscle weakness.  Tingling in the arms and legs.  Sudden tightening of muscles (muscle spasms).  Confusion.  Psychiatric issues, such as depression, irritability, or psychosis.  A feeling of fluttering of the heart.  Seizures. These symptoms are more severe if magnesium levels drop suddenly. How is this diagnosed? This condition may be diagnosed based on:  Your symptoms and medical history.  A physical exam.  Blood and urine tests. How is this treated? Treatment depends on the cause and the severity of the condition. It may be treated with:  A magnesium supplement. This can be taken in pill form. If the condition is severe, magnesium is usually given through an IV.  Changes to your diet. You may be directed to eat foods that have a lot of magnesium, such as green leafy vegetables, peas, beans, and nuts.  Stopping any intake of alcohol. Follow these instructions at home:      Make sure that your diet includes foods with magnesium. Foods that have a lot of magnesium in them include: ? Green leafy vegetables, such as spinach and broccoli. ? Beans and peas. ? Nuts and seeds, such as almonds and sunflower seeds. ? Whole grains, such as whole grain bread and fortified cereals.  Take magnesium supplements if your health care provider tells you to do that. Take them as directed.  Take over-the-counter and prescription medicines only as told by your health care provider.  Have  your magnesium levels monitored as told by your health care provider.  When you are active, drink fluids that contain electrolytes.  Avoid drinking alcohol.  Keep all follow-up visits as told by your health care provider. This is important. Contact a health care provider if:  You get worse instead of better.  Your symptoms return. Get help right away if you:  Develop severe muscle weakness.  Have trouble breathing.  Feel that your heart is racing. Summary  Hypomagnesemia is a condition in which the level of magnesium in the blood is low.  Hypomagnesemia can affect every organ in the body.  Treatment may include eating more foods that contain magnesium, taking magnesium supplements, and not drinking alcohol.  Have your magnesium levels monitored as told by your health care provider. This information is not intended to replace advice given to you by your health care provider. Make sure you discuss any questions you have with your health care provider. Document Revised: 06/01/2017 Document Reviewed: 05/21/2017 Elsevier Patient Education  [place]. Rehydration, Adult Rehydration is the replacement of body fluids and salts and minerals (electrolytes) that are lost during dehydration. Dehydration is when there is not enough fluid or water in the body. This happens when you lose more fluids than you take in. Common causes of dehydration include:  Vomiting.  Diarrhea.  Excessive sweating, such as  from heat exposure or exercise.  Taking medicines that cause the body to lose excess fluid (diuretics).  Impaired kidney function.  Not drinking enough fluid.  Certain illnesses or infections.  Certain poorly controlled long-term (chronic) illnesses, such as diabetes, heart disease, and kidney disease.  Symptoms of mild dehydration may include thirst, dry lips and mouth, dry skin, and dizziness. Symptoms of severe dehydration may include increased heart rate, confusion,  fainting, and not urinating. You can rehydrate by drinking certain fluids or getting fluids through an IV tube, as told by your health care provider. What are the risks? Generally, rehydration is safe. However, one problem that can happen is taking in too much fluid (overhydration). This is rare. If overhydration happens, it can cause an electrolyte imbalance, kidney failure, or a decrease in salt (sodium) levels in the body. How to rehydrate Follow instructions from your health care provider for rehydration. The kind of fluid you should drink and the amount you should drink depend on your condition.  If directed by your health care provider, drink an oral rehydration solution (ORS). This is a drink designed to treat dehydration that is found in pharmacies and retail stores. ? Make an ORS by following instructions on the package. ? Start by drinking small amounts, about  cup (120 mL) every 5-10 minutes. ? Slowly increase how much you drink until you have taken the amount recommended by your health care provider.  Drink enough clear fluids to keep your urine clear or pale yellow. If you were instructed to drink an ORS, finish the ORS first, then start slowly drinking other clear fluids. Drink fluids such as: ? Water. Do not drink only water. Doing that can lead to having too little sodium in your body (hyponatremia). ? Ice chips. ? Fruit juice that you have added water to (diluted juice). ? Low-calorie sports drinks.  If you are severely dehydrated, your health care provider may recommend that you receive fluids through an IV tube in the hospital.  Do not take sodium tablets. Doing that can lead to the condition of having too much sodium in your body (hypernatremia). Eating while you rehydrate Follow instructions from your health care provider about what to eat while you rehydrate. Your health care provider may recommend that you slowly begin eating regular foods in small amounts.  Eat foods  that contain a healthy balance of electrolytes, such as bananas, oranges, potatoes, tomatoes, and spinach.  Avoid foods that are greasy or contain a lot of fat or sugar.  In some cases, you may get nutrition through a feeding tube that is passed through your nose and into your stomach (nasogastric tube, or NG tube). This may be done if you have uncontrolled vomiting or diarrhea. Beverages to avoid Certain beverages may make dehydration worse. While you rehydrate, avoid:  Alcohol.  Caffeine.  Drinks that contain a lot of sugar. These include: ? High-calorie sports drinks. ? Fruit juice that is not diluted. ? Soda.  Check nutrition labels to see how much sugar or caffeine a beverage contains. Signs of dehydration recovery You may be recovering from dehydration if:  You are urinating more often than before you started rehydrating.  Your urine is clear or pale yellow.  Your energy level improves.  You vomit less frequently.  You have diarrhea less frequently.  Your appetite improves or returns to normal.  You feel less dizzy or less light-headed.  Your skin tone and color start to look more normal. Contact a health care  provider if:  You continue to have symptoms of mild dehydration, such as: ? Thirst. ? Dry lips. ? Slightly dry mouth. ? Dry, warm skin. ? Dizziness.  You continue to vomit or have diarrhea. Get help right away if:  You have symptoms of dehydration that get worse.  You feel: ? Confused. ? Weak. ? Like you are going to faint.  You have not urinated in 6-8 hours.  You have very dark urine.  You have trouble breathing.  Your heart rate while sitting still is over 100 beats a minute.  You cannot drink fluids without vomiting.  You have vomiting or diarrhea that: ? Gets worse. ? Does not go away.  You have a fever. This information is not intended to replace advice given to you by your health care provider. Make sure you discuss any  questions you have with your health care provider. Document Revised: 06/01/2017 Document Reviewed: 08/13/2015 Elsevier Patient Education  2020 [employer].

## 2020-03-15 ENCOUNTER — Other Ambulatory Visit: Payer: Self-pay

## 2020-03-15 ENCOUNTER — Inpatient Hospital Stay: Payer: 59 | Admitting: Family Medicine

## 2020-03-15 ENCOUNTER — Ambulatory Visit
Admission: RE | Admit: 2020-03-15 | Discharge: 2020-03-15 | Disposition: A | Discharge status: Home / Self Care | Payer: 59 | Source: Ambulatory Visit | Attending: Radiation Oncology | Admitting: Radiation Oncology

## 2020-03-15 DIAGNOSIS — C099 Malignant neoplasm of tonsil, unspecified: Secondary | ICD-10-CM

## 2020-03-15 DIAGNOSIS — R1313 Dysphagia, pharyngeal phase: Secondary | ICD-10-CM | POA: Diagnosis not present

## 2020-03-15 MED ORDER — SONAFINE EX EMUL
1.0000 "application " | Freq: Two times a day (BID) | CUTANEOUS | Status: DC
  Administered 2020-03-15: 1 via TOPICAL

## 2020-03-16 ENCOUNTER — Inpatient Hospital Stay: Payer: 59

## 2020-03-16 ENCOUNTER — Other Ambulatory Visit: Payer: Self-pay

## 2020-03-16 ENCOUNTER — Ambulatory Visit
Admission: RE | Admit: 2020-03-16 | Discharge: 2020-03-16 | Disposition: A | Discharge status: Home / Self Care | Payer: 59 | Source: Ambulatory Visit | Attending: Radiation Oncology | Admitting: Radiation Oncology

## 2020-03-16 VITALS — BP 125/65 | HR 50 | Temp 99.0°F | Resp 18

## 2020-03-16 DIAGNOSIS — Z95828 Presence of other vascular implants and grafts: Secondary | ICD-10-CM

## 2020-03-16 DIAGNOSIS — C099 Malignant neoplasm of tonsil, unspecified: Secondary | ICD-10-CM

## 2020-03-16 DIAGNOSIS — R1313 Dysphagia, pharyngeal phase: Secondary | ICD-10-CM | POA: Diagnosis not present

## 2020-03-16 MED ORDER — SODIUM CHLORIDE 0.9 % IV SOLN
Freq: Once | INTRAVENOUS | Status: AC
  Filled 2020-03-16: qty 250

## 2020-03-16 MED ORDER — POTASSIUM CHLORIDE 10 MEQ/100ML IV SOLN
INTRAVENOUS | Status: AC
  Filled 2020-03-16: qty 100

## 2020-03-16 MED ORDER — MAGNESIUM SULFATE 2 GM/50ML IV SOLN
2.0000 g | Freq: Once | INTRAVENOUS | Status: AC
  Administered 2020-03-16: 2 g via INTRAVENOUS

## 2020-03-16 MED ORDER — HEPARIN SOD (PORK) LOCK FLUSH 100 UNIT/ML IV SOLN
500.0000 [IU] | Freq: Once | INTRAVENOUS | Status: AC
  Administered 2020-03-16: 500 [IU] via INTRAVENOUS
  Filled 2020-03-16: qty 5

## 2020-03-16 MED ORDER — MAGNESIUM SULFATE 2 GM/50ML IV SOLN
INTRAVENOUS | Status: AC
  Filled 2020-03-16: qty 50

## 2020-03-16 MED ORDER — SODIUM CHLORIDE 0.9% FLUSH
10.0000 mL | Freq: Once | INTRAVENOUS | Status: AC
  Administered 2020-03-16: 10 mL via INTRAVENOUS
  Filled 2020-03-16: qty 10

## 2020-03-16 MED ORDER — POTASSIUM CHLORIDE 10 MEQ/100ML IV SOLN
10.0000 meq | Freq: Once | INTRAVENOUS | Status: AC
  Administered 2020-03-16: 10 meq via INTRAVENOUS

## 2020-03-16 NOTE — Patient Instructions (Addendum)
Hypokalemia Hypokalemia means that the amount of potassium in the blood is lower than normal. Potassium is a chemical (electrolyte) that helps regulate the amount of fluid in the body. It also stimulates muscle tightening (contraction) and helps nerves work properly. Normally, most of the body's potassium is inside cells, and only a very small amount is in the blood. Because the amount in the blood is so small, minor changes to potassium levels in the blood can be life-threatening. What are the causes? This condition may be caused by:  Antibiotic medicine.  Diarrhea or vomiting. Taking too much of a medicine that helps you have a bowel movement (laxative) can cause diarrhea and lead to hypokalemia.  Chronic kidney disease (CKD).  Medicines that help the body get rid of excess fluid (diuretics).  Eating disorders, such as bulimia.  Low magnesium levels in the body.  Sweating a lot. What are the signs or symptoms? Symptoms of this condition include:  Weakness.  Constipation.  Fatigue.  Muscle cramps.  Mental confusion.  Skipped heartbeats or irregular heartbeat (palpitations).  Tingling or numbness. How is this diagnosed? This condition is diagnosed with a blood test. How is this treated? This condition may be treated by:  Taking potassium supplements by mouth.  Adjusting the medicines that you take.  Eating more foods that contain a lot of potassium. If your potassium level is very low, you may need to get potassium through an IV and be monitored in the hospital. Follow these instructions at home:   Take over-the-counter and prescription medicines only as told by your health care provider. This includes vitamins and supplements.  Eat a healthy diet. A healthy diet includes fresh fruits and vegetables, whole grains, healthy fats, and lean proteins.  If instructed, eat more foods that contain a lot of potassium. This includes: ? Nuts, such as peanuts and  pistachios. ? Seeds, such as sunflower seeds and pumpkin seeds. ? Peas, lentils, and lima beans. ? Whole grain and bran cereals and breads. ? Fresh fruits and vegetables, such as apricots, avocado, bananas, cantaloupe, kiwi, oranges, tomatoes, asparagus, and potatoes. ? Orange juice. ? Tomato juice. ? Red meats. ? Yogurt.  Keep all follow-up visits as told by your health care provider. This is important. Contact a health care provider if you:  Have weakness that gets worse.  Feel your heart pounding or racing.  Vomit.  Have diarrhea.  Have diabetes (diabetes mellitus) and you have trouble keeping your blood sugar (glucose) in your target range. Get help right away if you:  Have chest pain.  Have shortness of breath.  Have vomiting or diarrhea that lasts for more than 2 days.  Faint. Summary  Hypokalemia means that the amount of potassium in the blood is lower than normal.  This condition is diagnosed with a blood test.  Hypokalemia may be treated by taking potassium supplements, adjusting the medicines that you take, or eating more foods that are high in potassium.  If your potassium level is very low, you may need to get potassium through an IV and be monitored in the hospital. This information is not intended to replace advice given to you by your health care provider. Make sure you discuss any questions you have with your health care provider. Document Revised: 01/30/2018 Document Reviewed: 01/30/2018 Elsevier Patient Education  2020 [employer]    Hypomagnesemia Hypomagnesemia is a condition in which the level of magnesium in the blood is low. Magnesium is a mineral that is found in many   used in many different processes in the body. Hypomagnesemia can affect every organ in the body. In severe cases, it can cause life-threatening problems. What are the causes? This condition may be caused by:  Not getting enough magnesium in your  diet.  Malnutrition.  Problems with absorbing magnesium from the intestines.  Dehydration.  Alcohol abuse.  Vomiting.  Severe or chronic diarrhea.  Some medicines, including medicines that make you urinate more (diuretics).  Certain diseases, such as kidney disease, diabetes, celiac disease, and overactive thyroid. What are the signs or symptoms? Symptoms of this condition include:  Loss of appetite.  Nausea and vomiting.  Involuntary shaking or trembling of a body part (tremor).  Muscle weakness.  Tingling in the arms and legs.  Sudden tightening of muscles (muscle spasms).  Confusion.  Psychiatric issues, such as depression, irritability, or psychosis.  A feeling of fluttering of the heart.  Seizures. These symptoms are more severe if magnesium levels drop suddenly. How is this diagnosed? This condition may be diagnosed based on:  Your symptoms and medical history.  A physical exam.  Blood and urine tests. How is this treated? Treatment depends on the cause and the severity of the condition. It may be treated with:  A magnesium supplement. This can be taken in pill form. If the condition is severe, magnesium is usually given through an IV.  Changes to your diet. You may be directed to eat foods that have a lot of magnesium, such as green leafy vegetables, peas, beans, and nuts.  Stopping any intake of alcohol. Follow these instructions at home:      Make sure that your diet includes foods with magnesium. Foods that have a lot of magnesium in them include: ? Green leafy vegetables, such as spinach and broccoli. ? Beans and peas. ? Nuts and seeds, such as almonds and sunflower seeds. ? Whole grains, such as whole grain bread and fortified cereals.  Take magnesium supplements if your health care provider tells you to do that. Take them as directed.  Take over-the-counter and prescription medicines only as told by your health care provider.  Have  your magnesium levels monitored as told by your health care provider.  When you are active, drink fluids that contain electrolytes.  Avoid drinking alcohol.  Keep all follow-up visits as told by your health care provider. This is important. Contact a health care provider if:  You get worse instead of better.  Your symptoms return. Get help right away if you:  Develop severe muscle weakness.  Have trouble breathing.  Feel that your heart is racing. Summary  Hypomagnesemia is a condition in which the level of magnesium in the blood is low.  Hypomagnesemia can affect every organ in the body.  Treatment may include eating more foods that contain magnesium, taking magnesium supplements, and not drinking alcohol.  Have your magnesium levels monitored as told by your health care provider. This information is not intended to replace advice given to you by your health care provider. Make sure you discuss any questions you have with your health care provider. Document Revised: 06/01/2017 Document Reviewed: 05/21/2017 Elsevier Patient Education  [place]. Rehydration, Adult Rehydration is the replacement of body fluids and salts and minerals (electrolytes) that are lost during dehydration. Dehydration is when there is not enough fluid or water in the body. This happens when you lose more fluids than you take in. Common causes of dehydration include:  Vomiting.  Diarrhea.  Excessive sweating, such as  from heat exposure or exercise.  Taking medicines that cause the body to lose excess fluid (diuretics).  Impaired kidney function.  Not drinking enough fluid.  Certain illnesses or infections.  Certain poorly controlled long-term (chronic) illnesses, such as diabetes, heart disease, and kidney disease.  Symptoms of mild dehydration may include thirst, dry lips and mouth, dry skin, and dizziness. Symptoms of severe dehydration may include increased heart rate, confusion,  fainting, and not urinating. You can rehydrate by drinking certain fluids or getting fluids through an IV tube, as told by your health care provider. What are the risks? Generally, rehydration is safe. However, one problem that can happen is taking in too much fluid (overhydration). This is rare. If overhydration happens, it can cause an electrolyte imbalance, kidney failure, or a decrease in salt (sodium) levels in the body. How to rehydrate Follow instructions from your health care provider for rehydration. The kind of fluid you should drink and the amount you should drink depend on your condition.  If directed by your health care provider, drink an oral rehydration solution (ORS). This is a drink designed to treat dehydration that is found in pharmacies and retail stores. ? Make an ORS by following instructions on the package. ? Start by drinking small amounts, about  cup (120 mL) every 5-10 minutes. ? Slowly increase how much you drink until you have taken the amount recommended by your health care provider.  Drink enough clear fluids to keep your urine clear or pale yellow. If you were instructed to drink an ORS, finish the ORS first, then start slowly drinking other clear fluids. Drink fluids such as: ? Water. Do not drink only water. Doing that can lead to having too little sodium in your body (hyponatremia). ? Ice chips. ? Fruit juice that you have added water to (diluted juice). ? Low-calorie sports drinks.  If you are severely dehydrated, your health care provider may recommend that you receive fluids through an IV tube in the hospital.  Do not take sodium tablets. Doing that can lead to the condition of having too much sodium in your body (hypernatremia). Eating while you rehydrate Follow instructions from your health care provider about what to eat while you rehydrate. Your health care provider may recommend that you slowly begin eating regular foods in small amounts.  Eat foods  that contain a healthy balance of electrolytes, such as bananas, oranges, potatoes, tomatoes, and spinach.  Avoid foods that are greasy or contain a lot of fat or sugar.  In some cases, you may get nutrition through a feeding tube that is passed through your nose and into your stomach (nasogastric tube, or NG tube). This may be done if you have uncontrolled vomiting or diarrhea. Beverages to avoid Certain beverages may make dehydration worse. While you rehydrate, avoid:  Alcohol.  Caffeine.  Drinks that contain a lot of sugar. These include: ? High-calorie sports drinks. ? Fruit juice that is not diluted. ? Soda.  Check nutrition labels to see how much sugar or caffeine a beverage contains. Signs of dehydration recovery You may be recovering from dehydration if:  You are urinating more often than before you started rehydrating.  Your urine is clear or pale yellow.  Your energy level improves.  You vomit less frequently.  You have diarrhea less frequently.  Your appetite improves or returns to normal.  You feel less dizzy or less light-headed.  Your skin tone and color start to look more normal. Contact a health care  provider if:  You continue to have symptoms of mild dehydration, such as: ? Thirst. ? Dry lips. ? Slightly dry mouth. ? Dry, warm skin. ? Dizziness.  You continue to vomit or have diarrhea. Get help right away if:  You have symptoms of dehydration that get worse.  You feel: ? Confused. ? Weak. ? Like you are going to faint.  You have not urinated in 6-8 hours.  You have very dark urine.  You have trouble breathing.  Your heart rate while sitting still is over 100 beats a minute.  You cannot drink fluids without vomiting.  You have vomiting or diarrhea that: ? Gets worse. ? Does not go away.  You have a fever. This information is not intended to replace advice given to you by your health care provider. Make sure you discuss any  questions you have with your health care provider. Document Revised: 06/01/2017 Document Reviewed: 08/13/2015 Elsevier Patient Education  2020 [employer].

## 2020-03-16 NOTE — Progress Notes (Signed)
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    Met with patient during infusion.  Patient reports that he has not used tube in the last 2-3 days because he is eating.  States that he ate few bites of chicken, 1/2 of sweet potato, few bites of broccoli and 1/4 of protein shake yesterday.  Patient hard of hearing.  Request that RD call his daughter [name].   Medications: reviewed  Labs: reviewed  Anthropometrics:   Weight 204 lb 6 oz on 03/15/20 decreased from 217 lb 3 oz on 9/3.     Estimated Energy Needs  Kcals: 7366-8159 Protein: 115-130 g Fluid: 2.6 L  NUTRITION DIAGNOSIS: Unintentional weight loss continues   INTERVENTION:  Discussed importance of patient still giving at least 4 full cartons of [facility] through feeding tube in addition to eating orally.  Goal rate of feeding is 1 1/2 cartons QID if unable to eat anything by mouth.  Stressed that what he is able to eat by mouth is not enough calories to maintain his weight.   Talked with daughter and voices concern that patient is not eat much orally or drinking.  Patient will not let her or her mother give tube feeding due to feelings of nausea.  Daughter thinks this may have been coincidental when patient received feeding early on and now patient does not want to try it again.  Daughter offered feeding this am but patient refused and has only had 120 ml of water via tube today. Daughter requested that RD to try and feed patient in infusion.  RD took feeding and supplies to infusion but patient declined feeding.  Patient took one sip of ensure enlive chocolate shake.  Patient agreed that he would finish shake by the end of infusion and when he got home give a feeding via tube.  Patient verbalized to RD that he needs to give tube feeding and eat by mouth.    MONITORING, EVALUATION, GOAL: weight trends, intake, tube feeding    NEXT VISIT: Sept 21 in infusion,  [place]b  Raymonda Pell B. Zenia Resides, [place], [place] Registered Dietitian [phone] (mobile)

## 2020-03-17 ENCOUNTER — Other Ambulatory Visit: Payer: Self-pay

## 2020-03-17 ENCOUNTER — Ambulatory Visit (HOSPITAL_COMMUNITY)
Admission: RE | Admit: 2020-03-17 | Discharge: 2020-03-17 | Disposition: A | Discharge status: Home / Self Care | Payer: 59 | Source: Ambulatory Visit | Attending: Radiation Oncology | Admitting: Radiation Oncology

## 2020-03-17 ENCOUNTER — Encounter (HOSPITAL_COMMUNITY): Payer: 59

## 2020-03-17 ENCOUNTER — Ambulatory Visit
Admission: RE | Admit: 2020-03-17 | Discharge: 2020-03-17 | Disposition: A | Discharge status: Home / Self Care | Payer: 59 | Source: Ambulatory Visit | Attending: Radiation Oncology | Admitting: Radiation Oncology

## 2020-03-17 DIAGNOSIS — R1312 Dysphagia, oropharyngeal phase: Secondary | ICD-10-CM | POA: Diagnosis present

## 2020-03-17 DIAGNOSIS — Z79899 Other long term (current) drug therapy: Secondary | ICD-10-CM | POA: Diagnosis not present

## 2020-03-17 DIAGNOSIS — C099 Malignant neoplasm of tonsil, unspecified: Secondary | ICD-10-CM | POA: Diagnosis not present

## 2020-03-17 DIAGNOSIS — R1313 Dysphagia, pharyngeal phase: Secondary | ICD-10-CM | POA: Diagnosis not present

## 2020-03-18 ENCOUNTER — Ambulatory Visit
Admission: RE | Admit: 2020-03-18 | Discharge: 2020-03-18 | Disposition: A | Discharge status: Home / Self Care | Payer: 59 | Source: Ambulatory Visit | Attending: Radiation Oncology | Admitting: Radiation Oncology

## 2020-03-18 ENCOUNTER — Other Ambulatory Visit: Payer: Self-pay

## 2020-03-18 ENCOUNTER — Ambulatory Visit: Payer: 59 | Attending: Radiation Oncology

## 2020-03-18 DIAGNOSIS — R41841 Cognitive communication deficit: Secondary | ICD-10-CM

## 2020-03-18 DIAGNOSIS — R471 Dysarthria and anarthria: Secondary | ICD-10-CM

## 2020-03-18 DIAGNOSIS — R1313 Dysphagia, pharyngeal phase: Secondary | ICD-10-CM

## 2020-03-18 NOTE — Patient Instructions (Signed)
Reviewed results and recommendations (including precautions) of modified (MBSS) with pt and daughter.

## 2020-03-18 NOTE — Progress Notes (Signed)
Oncology Nurse Navigator Documentation  I met with Omar Christian and his daughter Omar Christian before his appointment with Garald Balding SLP today. We discussed that he is feeling well and had a good day yesterday. He is eating well, and energy is increased. They deny any concerns at this time and know to call me if they have any further questions or concerns.   Harlow Asa RN, BSN, OCN Head & Neck Oncology Nurse [place] at [facility] Phone # [phone]  Fax # [phone]

## 2020-03-18 NOTE — Therapy (Signed)
[facility] [address] [place], [place], [address] Phone: [phone]   Fax:  [phone]  Speech Language Pathology Treatment  Patient Details  Name: Omar Christian MRN: [id number] Date of Birth: September 15, 1955 Referring Provider (SLP): [name], MD   Encounter Date: 03/18/2020   End of Session - 03/18/20 0957    Visit Number 2    Number of Visits 7    Date for SLP Re-Evaluation 05/19/20    SLP Start Time 0850    SLP Stop Time  0937    SLP Time Calculation (min) 47 min    Activity Tolerance Other (comment)   mildly limited by cognition          Past Medical History:  Diagnosis Date  . Abnormal findings on cardiac catheterization 09/30/15   [facility]  . Abnormal myocardial perfusion study 09/24/15   [facility]  . CAD, multiple vessel 09/30/15   per CATH @ [facility]/DR.PARACHOS  . Coronary artery disease   . GERD (gastroesophageal reflux disease)   . Gout   . H/O echocardiogram 09/24/15   [facility]  . Hard of hearing   . History of pneumonia   . Hyperlipidemia   . Hypertension   . Pneumonia   . Progressive angina ([place])   . 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 CARDIAC CATHETERIZATION N/A 09/30/2015   Procedure: Left Heart Cath and Coronary Angiography;  Surgeon: [name], MD;  Location: [facility];  Service: Cardiovascular;  Laterality: N/A;  . CORONARY ARTERY BYPASS GRAFT N/A 10/08/2015   Procedure: CORONARY ARTERY BYPASS GRAFTING time 4 using left internal mammary and right greater saphenous vein harvested by endovein;  Surgeon: [name], MD;  Location: [facility];  Service: Open Heart Surgery;  Laterality: N/A;  . CORONARY ARTERY BYPASS GRAFT    . IR GASTROSTOMY TUBE MOD SED  02/10/2020  . IR IMAGING GUIDED PORT INSERTION  02/10/2020  . MULTIPLE EXTRACTIONS WITH ALVEOLOPLASTY N/A 02/04/2020    Procedure: Extraction of tooth #'s [phone], and 32 with alveoloplasty and gross debridement of remaining teeth.;  Surgeon: [name], DDS;  Location: [facility];  Service: Oral Surgery;  Laterality: N/A;  . TEE WITHOUT CARDIOVERSION N/A 10/08/2015   Procedure: TRANSESOPHAGEAL ECHOCARDIOGRAM (TEE);  Surgeon: [name], MD;  Location: [facility];  Service: Open Heart Surgery;  Laterality: N/A;  . VASECTOMY    . WISDOM TOOTH EXTRACTION      There were no vitals filed for this visit.   Subjective Assessment - 03/18/20 0857    Subjective Pt with hydrophonic voice when SLP walked into room.    Patient is accompained by: --   daughter - Crystal   Currently in Pain? No/denies                 ADULT SLP TREATMENT - 03/18/20 0914      General Information   Behavior/Cognition Alert;Cooperative;Pleasant mood;Hard of hearing   "I can hear you fine" (with SLP raised volume)     Treatment Provided   Treatment provided Dysphagia      Dysphagia Treatment   Temperature Spikes Noted No    Respiratory Status Room air    Oral Cavity - Dentition Missing dentition    Treatment Methods Skilled observation;Compensation strategy training;Patient/caregiver education    Feeding Able to feed self    Liquids provided via Cup  Pharyngeal Phase Signs & Symptoms Wet vocal quality   occasionally-if pt didn't take additional dry swallow   Type of cueing Verbal;Visual   usual, faded to occasional (for strategies)   Other treatment/comments Pt told SLP he hasn't done swallow HEP. SLP reviewed findings and recommendations from MBSS yesterday with pt and daughter [name]. Pt confirmed he had precautions written on a sheet of paper at home, provided by [place]. SLP completely reviewed HEP with pt and daughter, pt req'd consistent mod-max A faded to occasional min A with each exercise. SLP stated daughter may need to assist pt or communicate to family that pt may require assistance with HEP due to memory  and sequencing difficulties. Pt req'd total A with specifics re: rationale for HEP, even though SLP reviewed this information with pt x3 during the session.      Assessment / Recommendations / Plan   Plan Continue with current plan of care      Dysphagia Recommendations   Diet recommendations Dysphagia 3 (mechanical soft);Thin liquid    Liquids provided via Cup    Medication Administration Whole meds with puree   pt told SLP pills were not getting stuck   Supervision Intermittent supervision to cue for compensatory strategies    Compensations Slow rate;Small sips/bites;Multiple dry swallows after each bite/sip;Follow solids with liquid;Clear throat intermittently   start with water, hock/spit after meal, swish/spit after meal     Progression Toward Goals   Progression toward goals Not progressing toward goals (comment)   pt did not do HEP; decr'd cognitive level           SLP Education - 03/18/20 0957    Education Details HEP procedure, late effects head/neck radiationon swallowing, results/recs from MBSS yesterday    Person(s) Educated Patient;Child(ren)    Methods Explanation;Demonstration;Verbal cues    Comprehension Verbalized understanding;Returned demonstration;Verbal cues required;Need further instruction            SLP Short Term Goals - 03/18/20 1154      SLP SHORT TERM GOAL #1   Title pt will complete HEP with usual min A    Time 1    Period --   visits, for all STGs   Status On-going      SLP SHORT TERM GOAL #2   Title pt will tell SLP why pt is completing HEP with min A, within 5 minutes of being told initially - x2 sessions    Time 1    Status On-going      SLP SHORT TERM GOAL #3   Title pt/family will describe 3 overt s/s aspiration PNA with modified independence    Time 1    Status On-going      SLP SHORT TERM GOAL #4   Title pt/family will tell SLP how a food journal could hasten return to a more normalized diet    Time 2    Status On-going              SLP Long Term Goals - 03/18/20 1155      SLP LONG TERM GOAL #1   Title pt will complete HEP with occasional min A from family x2 sessions    Time 3    Period --   sessions, for all LTGs   Status On-going      SLP LONG TERM GOAL #2   Title pt/family will describe how to modify HEP over time, and the timeline associated with reduction in HEP frequency with modified independence over two  sessions    Time 5    Status On-going      SLP LONG TERM GOAL #3   Title pt will demonstrate adherance to any swallowing safety precautions from an objective swallow assessment with occasional min A from family/SLP    Time 2    Status On-going            Plan - 03/18/20 1150    Clinical Impression Statement Modified barium swallow (MBSS) occurred yesterday: Mild oral and mod pharyngeal dysphagia was ID'd. Swallow reflex highly affected by pt's reduced laryngeal elevation and decr'd tongue base retraction, leaving vallecular residual with liquids, tablets, and drier foods. Dys III/thin recommended with slow rate, small sips/bites, alternate sips/bites, 1-2 additional swallows per bite/sip, take water prior to meal and with meals, expectorate after meals, swish and spit after meals. Pt with cognitive deficits so he will likely require someone to provide at least intermittent supervision for meals to remind pt of these precautions. Pt's dysphagia may subside as pt's chemorad tx begins to shrink tumor size. SLP designed an individualized HEP for dysphagia and pt completed each exercise on their own with ** cues.  Data indicate that pt's swallow ability will likely decrease over the course of radiation therapy and could very well decline over time following conclusion of their radiation therapy due to muscle disuse atrophy and/or muscle fibrosis. Pt will cont to need to be seen by SLP in order to assess safety of PO intake, assess the need for recommending any objective swallow assessment, and ensuring pt  correctly completes the individualized HEP.    Speech Therapy Frequency --   once, approx every 4 weeks   Duration --   7 total sessions   Treatment/Interventions Aspiration precaution training;Pharyngeal strengthening exercises;Diet toleration management by SLP;Trials of upgraded texture/liquids;Compensatory techniques;Patient/family education;SLP instruction and feedback;Multimodal communcation approach;Cognitive reorganization    Potential to Achieve Goals Fair    Potential Considerations Ability to learn/carryover information;Previous level of function    SLP Home Exercise Plan provided today    Consulted and Agree with Plan of Care Patient           Patient will benefit from skilled therapeutic intervention in order to improve the following deficits and impairments:   Dysphagia, pharyngeal phase  Cognitive communication deficit  Dysarthria and anarthria    Problem List Patient Active Problem List   Diagnosis Date Noted  . Fever   . Tachycardia   . Severe sepsis ([place]) 03/04/2020  . Patient on antineoplastic chemotherapy regimen   . Goals of care, counseling/discussion   . Palliative care by specialist   . DNR (do not resuscitate) discussion   . Sepsis ([place]) 03/02/2020  . Cancer of tonsil ([place]) 02/04/2020  . Poor dentition 02/04/2020  . 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[place]) 01/31/2020  . Tonsillar cancer ([place]) 01/27/2020  . 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 Unstable angina pectoris ([place]) 10/04/2015  . Progressive angina ([place])   . Abnormal findings on cardiac catheterization 09/30/2015  . CAD, multiple vessel 09/30/2015  . Abnormal findings diagnostic imaging of heart and coronary circulation 09/30/2015  . CAD in native artery 09/30/2015  . H/O echocardiogram 09/24/2015  . Abnormal myocardial perfusion study 09/24/2015  . Chest pain 09/03/2015  . Allergic rhinitis 08/27/2015  . H/O: gout 08/27/2015  . HLD (hyperlipidemia) 08/27/2015  . Benign hypertension 08/27/2015   . Acid reflux 08/27/2015  . Essential (primary) hypertension 08/27/2015  . H/O disease 08/27/2015    [facility] ,[place], [place]  03/18/2020, 11:56 AM  [facility] [address] [place], [place], [address] Phone: [phone]   Fax:  [phone]   Name: Dublin Grayer MRN: [id number] Date of Birth: 10/13/1955

## 2020-03-19 ENCOUNTER — Other Ambulatory Visit: Payer: Self-pay | Admitting: *Deleted

## 2020-03-19 ENCOUNTER — Ambulatory Visit
Admission: RE | Admit: 2020-03-19 | Discharge: 2020-03-19 | Disposition: A | Discharge status: Home / Self Care | Payer: 59 | Source: Ambulatory Visit | Attending: Radiation Oncology | Admitting: Radiation Oncology

## 2020-03-19 ENCOUNTER — Other Ambulatory Visit: Payer: Self-pay

## 2020-03-19 ENCOUNTER — Telehealth: Payer: Self-pay | Admitting: *Deleted

## 2020-03-19 DIAGNOSIS — R1313 Dysphagia, pharyngeal phase: Secondary | ICD-10-CM | POA: Diagnosis not present

## 2020-03-19 NOTE — Telephone Encounter (Signed)
Per contact with Dr Saralyn Pilar, pt's cardiologist at the [facility] clinic- records showing vital signs including ongoing decrease pulse faxed to [phone].

## 2020-03-20 ENCOUNTER — Other Ambulatory Visit: Payer: Self-pay

## 2020-03-20 ENCOUNTER — Inpatient Hospital Stay: Payer: 59

## 2020-03-20 VITALS — BP 138/61 | HR 93 | Temp 99.3°F | Resp 18

## 2020-03-20 DIAGNOSIS — C099 Malignant neoplasm of tonsil, unspecified: Secondary | ICD-10-CM

## 2020-03-20 DIAGNOSIS — Z95828 Presence of other vascular implants and grafts: Secondary | ICD-10-CM

## 2020-03-20 DIAGNOSIS — R1313 Dysphagia, pharyngeal phase: Secondary | ICD-10-CM | POA: Diagnosis not present

## 2020-03-20 MED ORDER — POTASSIUM CHLORIDE 10 MEQ/100ML IV SOLN
10.0000 meq | Freq: Once | INTRAVENOUS | Status: AC
  Administered 2020-03-20: 10 meq via INTRAVENOUS

## 2020-03-20 MED ORDER — SODIUM CHLORIDE 0.9% FLUSH
10.0000 mL | Freq: Once | INTRAVENOUS | Status: AC | PRN
  Administered 2020-03-20: 10 mL
  Filled 2020-03-20: qty 10

## 2020-03-20 MED ORDER — POTASSIUM CHLORIDE 10 MEQ/100ML IV SOLN
INTRAVENOUS | Status: AC
  Filled 2020-03-20: qty 100

## 2020-03-20 MED ORDER — MAGNESIUM SULFATE 2 GM/50ML IV SOLN
INTRAVENOUS | Status: AC
  Filled 2020-03-20: qty 50

## 2020-03-20 MED ORDER — HEPARIN SOD (PORK) LOCK FLUSH 100 UNIT/ML IV SOLN
500.0000 [IU] | Freq: Once | INTRAVENOUS | Status: AC
  Administered 2020-03-20: 500 [IU]
  Filled 2020-03-20: qty 5

## 2020-03-20 MED ORDER — MAGNESIUM SULFATE 2 GM/50ML IV SOLN
2.0000 g | Freq: Once | INTRAVENOUS | Status: AC
  Administered 2020-03-20: 2 g via INTRAVENOUS

## 2020-03-20 MED ORDER — SODIUM CHLORIDE 0.9 % IV SOLN
Freq: Once | INTRAVENOUS | Status: AC
  Filled 2020-03-20: qty 250

## 2020-03-20 NOTE — Patient Instructions (Signed)
Dehydration, Adult Dehydration is a condition in which there is not enough water or other fluids in the body. This happens when a person loses more fluids than he or she takes in. Important organs, such as the kidneys, brain, and heart, cannot function without a proper amount of fluids. Any loss of fluids from the body can lead to dehydration. Dehydration can be mild, moderate, or severe. It should be treated right away to prevent it from becoming severe. What are the causes? Dehydration may be caused by:  Conditions that cause loss of water or other fluids, such as diarrhea, vomiting, or sweating or urinating a lot.  Not drinking enough fluids, especially when you are ill or doing activities that require a lot of energy.  Other illnesses and conditions, such as fever or infection.  Certain medicines, such as medicines that remove excess fluid from the body (diuretics).  Lack of safe drinking water.  Not being able to get enough water and food. What increases the risk? The following factors may make you more likely to develop this condition:  Having a long-term (chronic) illness that has not been treated properly, such as diabetes, heart disease, or kidney disease.  Being 65 years of age or older.  Having a disability.  Living in a place that is high in altitude, where thinner, drier air causes more fluid loss.  Doing exercises that put stress on your body for a long time (endurance sports). What are the signs or symptoms? Symptoms of dehydration depend on how severe it is. Mild or moderate dehydration  Thirst.  Dry lips or dry mouth.  Dizziness or light-headedness, especially when standing up from a seated position.  Muscle cramps.  Dark urine. Urine may be the color of tea.  Less urine or tears produced than usual.  Headache. Severe dehydration  Changes in skin. Your skin may be cold and clammy, blotchy, or pale. Your skin also may not return to normal after being  lightly pinched and released.  Little or no tears, urine, or sweat.  Changes in vital signs, such as rapid breathing and low blood pressure. Your pulse may be weak or may be faster than 100 beats a minute when you are sitting still.  Other changes, such as: ? Feeling very thirsty. ? Sunken eyes. ? Cold hands and feet. ? Confusion. ? Being very tired (lethargic) or having trouble waking from sleep. ? Short-term weight loss. ? Loss of consciousness. How is this diagnosed? This condition is diagnosed based on your symptoms and a physical exam. You may have blood and urine tests to help confirm the diagnosis. How is this treated? Treatment for this condition depends on how severe it is. Treatment should be started right away. Do not wait until dehydration becomes severe. Severe dehydration is an emergency and needs to be treated in a hospital.  Mild or moderate dehydration can be treated at home. You may be asked to: ? Drink more fluids. ? Drink an oral rehydration solution (ORS). This drink helps restore proper amounts of fluids and salts and minerals in the blood (electrolytes).  Severe dehydration can be treated: ? With IV fluids. ? By correcting abnormal levels of electrolytes. This is often done by giving electrolytes through a tube that is passed through your nose and into your stomach (nasogastric tube, or NG tube). ? By treating the underlying cause of dehydration. Follow these instructions at home: Oral rehydration solution If told by your health care provider, drink an ORS:  Make   an ORS by following instructions on the package.  Start by drinking small amounts, about  cup (120 mL) every 5-10 minutes.  Slowly increase how much you drink until you have taken the amount recommended by your health care provider. Eating and drinking         Drink enough clear fluid to keep your urine pale yellow. If you were told to drink an ORS, finish the ORS first and then start slowly  drinking other clear fluids. Drink fluids such as: ? Water. Do not drink only water. Doing that can lead to hyponatremia, which is having too little salt (sodium) in the body. ? Water from ice chips you suck on. ? Fruit juice that you have added water to (diluted fruit juice). ? Low-calorie sports drinks.  Eat foods that contain a healthy balance of electrolytes, such as bananas, oranges, potatoes, tomatoes, and spinach.  Do not drink alcohol.  Avoid the following: ? Drinks that contain a lot of sugar. These include high-calorie sports drinks, fruit juice that is not diluted, and soda. ? Caffeine. ? Foods that are greasy or contain a lot of fat or sugar. General instructions  Take over-the-counter and prescription medicines only as told by your health care provider.  Do not take sodium tablets. Doing that can lead to having too much sodium in the body (hypernatremia).  Return to your normal activities as told by your health care provider. Ask your health care provider what activities are safe for you.  Keep all follow-up visits as told by your health care provider. This is important. Contact a health care provider if:  You have muscle cramps, pain, or discomfort, such as: ? Pain in your abdomen and the pain gets worse or stays in one area (localizes). ? Stiff neck.  You have a rash.  You are more irritable than usual.  You are sleepier or have a harder time waking than usual.  You feel weak or dizzy.  You feel very thirsty. Get help right away if you have:  Any symptoms of severe dehydration.  Symptoms of vomiting, such as: ? You cannot eat or drink without vomiting. ? Vomiting gets worse or does not go away. ? Vomit includes blood or green matter (bile).  Symptoms that get worse with treatment.  A fever.  A severe headache.  Problems with urination or bowel movements, such as: ? Diarrhea that gets worse or does not go away. ? Blood in your stool (feces). This  may cause stool to look black and tarry. ? Not urinating, or urinating only a small amount of very dark urine, within 6-8 hours.  Trouble breathing. These symptoms may represent a serious problem that is an emergency. Do not wait to see if the symptoms will go away. Get medical help right away. Call your local emergency services (911 in the [place] Do not drive yourself to the hospital. Summary  Dehydration is a condition in which there is not enough water or other fluids in the body. This happens when a person loses more fluids than he or she takes in.  Treatment for this condition depends on how severe it is. Treatment should be started right away. Do not wait until dehydration becomes severe.  Drink enough clear fluid to keep your urine pale yellow. If you were told to drink an oral rehydration solution (ORS), finish the ORS first and then start slowly drinking other clear fluids.  Take over-the-counter and prescription medicines only as told by your health care   provider.  Get help right away if you have any symptoms of severe dehydration. This information is not intended to replace advice given to you by your health care provider. Make sure you discuss any questions you have with your health care provider. Document Revised: 01/30/2019 Document Reviewed: 01/30/2019 Elsevier Patient Education  2020 [employer]   

## 2020-03-21 ENCOUNTER — Encounter: Payer: Self-pay | Admitting: Hematology and Oncology

## 2020-03-22 ENCOUNTER — Inpatient Hospital Stay: Payer: 59

## 2020-03-22 ENCOUNTER — Other Ambulatory Visit: Payer: Self-pay

## 2020-03-22 ENCOUNTER — Other Ambulatory Visit: Payer: Self-pay | Admitting: *Deleted

## 2020-03-22 ENCOUNTER — Inpatient Hospital Stay (HOSPITAL_BASED_OUTPATIENT_CLINIC_OR_DEPARTMENT_OTHER): Payer: 59 | Admitting: Medical

## 2020-03-22 ENCOUNTER — Ambulatory Visit
Admission: RE | Admit: 2020-03-22 | Discharge: 2020-03-22 | Disposition: A | Discharge status: Home / Self Care | Payer: 59 | Source: Ambulatory Visit | Attending: Radiation Oncology | Admitting: Radiation Oncology

## 2020-03-22 VITALS — BP 121/61 | HR 70 | Temp 99.1°F | Resp 17 | Ht 72.0 in | Wt 198.3 lb

## 2020-03-22 VITALS — BP 130/58 | HR 50 | Temp 99.1°F | Resp 16

## 2020-03-22 DIAGNOSIS — C099 Malignant neoplasm of tonsil, unspecified: Secondary | ICD-10-CM

## 2020-03-22 DIAGNOSIS — Z95828 Presence of other vascular implants and grafts: Secondary | ICD-10-CM

## 2020-03-22 DIAGNOSIS — R112 Nausea with vomiting, unspecified: Secondary | ICD-10-CM

## 2020-03-22 DIAGNOSIS — B370 Candidal stomatitis: Secondary | ICD-10-CM

## 2020-03-22 DIAGNOSIS — R1313 Dysphagia, pharyngeal phase: Secondary | ICD-10-CM | POA: Diagnosis not present

## 2020-03-22 LAB — CBC WITH DIFFERENTIAL (CANCER CENTER ONLY)
Abs Immature Granulocytes: 0.01 10*3/uL (ref 0.00–0.07)
Basophils Absolute: 0 10*3/uL (ref 0.0–0.1)
Basophils Relative: 0 %
Eosinophils Absolute: 0 10*3/uL (ref 0.0–0.5)
Eosinophils Relative: 0 %
HCT: 29.2 % — ABNORMAL LOW (ref 39.0–52.0)
Hemoglobin: 9.5 g/dL — ABNORMAL LOW (ref 13.0–17.0)
Immature Granulocytes: 0 %
Lymphocytes Relative: 10 %
Lymphs Abs: 0.2 10*3/uL — ABNORMAL LOW (ref 0.7–4.0)
MCH: 26.9 pg (ref 26.0–34.0)
MCHC: 32.5 g/dL (ref 30.0–36.0)
MCV: 82.7 fL (ref 80.0–100.0)
Monocytes Absolute: 0.3 10*3/uL (ref 0.1–1.0)
Monocytes Relative: 12 %
Neutro Abs: 1.9 10*3/uL (ref 1.7–7.7)
Neutrophils Relative %: 78 %
Platelet Count: 106 10*3/uL — ABNORMAL LOW (ref 150–400)
RBC: 3.53 MIL/uL — ABNORMAL LOW (ref 4.22–5.81)
RDW: 16.4 % — ABNORMAL HIGH (ref 11.5–15.5)
WBC Count: 2.5 10*3/uL — ABNORMAL LOW (ref 4.0–10.5)
nRBC: 0 % (ref 0.0–0.2)

## 2020-03-22 LAB — CMP (CANCER CENTER ONLY)
ALT: 22 U/L (ref 0–44)
AST: 16 U/L (ref 15–41)
Albumin: 3.4 g/dL — ABNORMAL LOW (ref 3.5–5.0)
Alkaline Phosphatase: 105 U/L (ref 38–126)
Anion gap: 11 (ref 5–15)
BUN: 20 mg/dL (ref 8–23)
CO2: 27 mmol/L (ref 22–32)
Calcium: 9.5 mg/dL (ref 8.9–10.3)
Chloride: 98 mmol/L (ref 98–111)
Creatinine: 1.62 mg/dL — ABNORMAL HIGH (ref 0.61–1.24)
GFR, Est AFR Am: 52 mL/min — ABNORMAL LOW (ref >60)
GFR, Estimated: 45 mL/min — ABNORMAL LOW (ref >60)
Glucose, Bld: 129 mg/dL — ABNORMAL HIGH (ref 70–99)
Potassium: 3.6 mmol/L (ref 3.5–5.1)
Sodium: 136 mmol/L (ref 135–145)
Total Bilirubin: 0.9 mg/dL (ref 0.3–1.2)
Total Protein: 6.8 g/dL (ref 6.5–8.1)

## 2020-03-22 LAB — MAGNESIUM: Magnesium: 1.6 mg/dL — ABNORMAL LOW (ref 1.7–2.4)

## 2020-03-22 MED ORDER — SODIUM CHLORIDE 0.9% FLUSH
10.0000 mL | INTRAVENOUS | Status: DC | PRN
  Administered 2020-03-22: 10 mL via INTRAVENOUS
  Filled 2020-03-22: qty 10

## 2020-03-22 MED ORDER — FLUCONAZOLE 100 MG PO TABS: 100.0000 mg | ORAL_TABLET | Freq: Two times a day (BID) | ORAL | 0 refills | Status: AC

## 2020-03-22 MED ORDER — METOCLOPRAMIDE HCL 10 MG/10ML PO SOLN: 5.0000 mg | Freq: Three times a day (TID) | ORAL | 0 refills | Status: DC

## 2020-03-22 MED ORDER — SODIUM CHLORIDE 0.9 % IV SOLN
Freq: Once | INTRAVENOUS | Status: AC
  Administered 2020-03-22: 500 mL/h via INTRAVENOUS
  Filled 2020-03-22: qty 250

## 2020-03-22 MED ORDER — HEPARIN SOD (PORK) LOCK FLUSH 100 UNIT/ML IV SOLN
500.0000 [IU] | Freq: Once | INTRAVENOUS | Status: AC
  Administered 2020-03-22: 500 [IU] via INTRAVENOUS
  Filled 2020-03-22: qty 5

## 2020-03-22 NOTE — Progress Notes (Signed)
[facility] Progress Note   Omar Christian [id number] May 06, 1956 64 y.o.  Omar Christian is managed by Dr. 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  He is followed by Dr. Lindi Christian and is scheduled to be seen in follow-up on 03/23/2020.  Nausea and vomiting after tube feeds: The patient was placed on Reglan 10 mg/10 mL 3 times daily.  Thrush: Omar Christian was told to restart and use nystatin rinse regularly.  Please see After Visit Summary for patient specific instructions.  Future Appointments  Date Time Provider C[place]  03/25/2020  8:15 AM [facility] LINAC 3 [facility] None  03/25/2020  9:15 AM [facility] INFUSION [facility] None  03/26/2020  8:15 AM [facility] LINAC 3 [facility] None  03/26/2020 10:00 AM Serena Colonel, RN [facility] None  03/27/2020  8:00 AM [facility] INFUSION [facility] None  03/29/2020  8:15 AM [facility] LINAC 3 [facility] None  03/30/2020  8:15 AM [facility] LINAC 3 [facility] None  03/30/2020  8:45 AM [facility] INFUSION [facility] None  03/30/2020  9:00 AM Jennet Maduro, RD [facility] None  03/31/2020  8:15 AM [facility] LINAC 3 [facility] None  04/01/2020  8:15 AM [facility] LINAC 3 [facility] None  04/02/2020  8:15 AM [facility] LINAC 3 [facility] None  04/03/2020 10:00 AM [facility] INFUSION  [facility] None  04/05/2020  8:15 AM [facility] LINAC 3 [facility] None  04/06/2020  8:15 AM [facility] LINAC 3 [facility] None  04/06/2020  8:45 AM [facility] INFUSION [facility] None  04/06/2020  9:45 AM Neff, Barbara L, RD [facility] None  04/07/2020  8:15 AM [facility] LINAC 3 [facility] None  04/08/2020  8:15 AM [facility] LINAC 3 [facility] None  04/09/2020  8:15 AM [facility] LINAC 3 [facility] None  04/10/2020  8:00 AM [facility] INFUSION [facility] None  04/12/2020  8:15 AM [facility] LINAC 3 [facility] None    No orders of the defined types were placed in this encounter.      Subjective:   Patient ID:  Omar Christian is a 64 y.o. (DOB [birth year]) male.  Chief Complaint: No chief complaint on file.   HPI Omar Christian  is a 64 y.o. male with a diagnosis of a cancer of the tonsil. He is followed by Dr. Lindi Christian and is currently treated with radiation and chemotherapy with cisplatin.He was treated with radiation today and presents with difficulty swallowing.  He has not been doing his tube feeds via his PEG tube because it has been causing him nausea.  He has eaten very little.  He has had little of anything to eat either orally or via his PEG tube over the past 2 days.  He reports having white plaques on the inside of his mouth.  He has a prescription for nystatin swish and swallow but has not been using it regularly.    Medications: I have reviewed the patient's current medications.  Allergies: No Known Allergies  Past Medical History:  Diagnosis Date  . Abnormal findings on cardiac catheterization 09/30/15  [facility]  . Abnormal myocardial perfusion study 09/24/15   [facility]  . CAD, multiple vessel 09/30/15   per CATH @ [facility]/DR.PARACHOS  . Coronary artery disease   . GERD (gastroesophageal reflux disease)   . Gout   . H/O echocardiogram 09/24/15   [place]  . Hard of hearing   . History of pneumonia   . Hyperlipidemia   . Hypertension   .  Pneumonia   . Progressive angina ([place])   . 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 CARDIAC CATHETERIZATION N/A 09/30/2015   Procedure: Left Heart Cath and Coronary Angiography;  Surgeon: [name], MD;  Location: [place] [facility];  Service: Cardiovascular;  Laterality: N/A;  . CORONARY ARTERY BYPASS GRAFT N/A 10/08/2015   Procedure: CORONARY ARTERY BYPASS GRAFTING time 4 using left internal mammary and right greater saphenous vein harvested by endovein;  Surgeon: [name], MD;  Location: [place];  Service: Open Heart Surgery;  Laterality: N/A;  . CORONARY ARTERY BYPASS GRAFT    . IR GASTROSTOMY TUBE MOD SED  02/10/2020  . IR IMAGING GUIDED PORT INSERTION  02/10/2020  . MULTIPLE EXTRACTIONS WITH ALVEOLOPLASTY N/A 02/04/2020   Procedure: Extraction of tooth #'s [phone], and 32 with alveoloplasty and gross debridement of remaining teeth.;  Surgeon: [name], DDS;  Location: [place];  Service: Oral Surgery;  Laterality: N/A;  . TEE WITHOUT CARDIOVERSION N/A 10/08/2015   Procedure: TRANSESOPHAGEAL ECHOCARDIOGRAM (TEE);  Surgeon: [name], MD;  Location: [place];  Service: Open Heart Surgery;  Laterality: N/A;  . VASECTOMY    . WISDOM TOOTH EXTRACTION      Family History  Problem Relation Age of Onset  . Heart disease Mother   . Hypertension Mother   . Alcohol abuse Father   . Throat cancer Father   . Muscular dystrophy Brother   . Muscular dystrophy Brother     Social History   Socioeconomic History  . Marital status: Married    Spouse name: Not on file  . Number of children: 2  . Years of education: Not on file  . Highest education level: Not on file  Occupational History  . Not on file  Tobacco Use  . Smoking status: Never Smoker  . Smokeless tobacco: Never Used  Vaping Use  . Vaping Use: Never used  Substance and Sexual Activity  . Alcohol use: No  . Drug  use: No  . Sexual activity: Not Currently    Birth control/protection: Surgical    Comment: Vasectomy  Other Topics Concern  . Not on file  Social History Narrative  . Not on file   Social Determinants of Health   Financial Resource Strain: Low Risk   . Difficulty of Paying Living Expenses: Not very hard  Food Insecurity:   . Worried About Charity fundraiser in the Last Year: Not on file  . Ran Out of Food in the Last Year: Not on file  Transportation Needs: No Transportation Needs  . Lack of Transportation (Medical): No  . Lack of Transportation (Non-Medical): No  Physical Activity:   . Days of Exercise per Week: Not on file  . Minutes of Exercise per Session: Not on file  Stress:   . Feeling of Stress : Not on file  Social Connections:   . Frequency of Communication with Friends and Family: Not on file  .  Frequency of Social Gatherings with Friends and Family: Not on file  . Attends Religious Services: Not on file  . Active Member of Clubs or Organizations: Not on file  . Attends Archivist Meetings: Not on file  . Marital Status: Not on file  Intimate Partner Violence:   . Fear of Current or Ex-Partner: Not on file  . Emotionally Abused: Not on file  . Physically Abused: Not on file  . Sexually Abused: Not on file    Past Medical History, Surgical history, Social history, and Family history were reviewed and updated as appropriate.   Please see review of systems for further details on the patient's review from today.   Review of Systems:  Review of Systems  Constitutional: Positive for appetite change. Negative for chills, diaphoresis and fever.  HENT: Positive for trouble swallowing.        Whitish plaquing of the oral mucosa.  Respiratory: Negative for cough, choking, shortness of breath and wheezing.   Cardiovascular: Negative for chest pain and palpitations.  Gastrointestinal: Positive for nausea and vomiting. Negative for constipation and diarrhea.    Genitourinary: Negative for decreased urine volume.  Neurological: Negative for headaches.    Objective:   Physical Exam:  BP 121/61 (BP Location: Left Arm, Patient Position: Sitting)   Pulse 70   Temp 99.1 F (37.3 C) (Tympanic)   Resp 17   Ht 6' (1.829 m)   Wt 198 lb 4.8 oz (89.9 kg)   SpO2 99%   BMI 26.89 kg/m  ECOG: 1  Physical Exam Constitutional:      General: He is not in acute distress.    Appearance: He is not diaphoretic.  HENT:     Head: Normocephalic and atraumatic.     Mouth/Throat:     Mouth: Mucous membranes are moist.     Comments: Whitish plaque in the oral mucosa. Eyes:     General: No scleral icterus.       Right eye: No discharge.        Left eye: No discharge.  Cardiovascular:     Rate and Rhythm: Normal rate and regular rhythm.     Heart sounds: Normal heart sounds. No murmur heard.  No friction rub. No gallop.   Pulmonary:     Effort: Pulmonary effort is normal. No respiratory distress.     Breath sounds: Normal breath sounds. No wheezing or rales.  Abdominal:     General: Bowel sounds are normal.     Tenderness: There is no abdominal tenderness.    Skin:    General: Skin is warm and dry.     Findings: No erythema or rash.  Neurological:     Mental Status: He is alert.     Lab Review:     Component Value Date/Time   NA 136 03/22/2020 1050   NA 141 09/19/2019 0950   K 3.6 03/22/2020 1050   CL 98 03/22/2020 1050   CO2 27 03/22/2020 1050   GLUCOSE 129 (H) 03/22/2020 1050   BUN 20 03/22/2020 1050   BUN 9 09/19/2019 0950   CREATININE 1.62 (H) 03/22/2020 1050   CALCIUM 9.5 03/22/2020 1050   PROT 6.8 03/22/2020 1050   PROT 6.6 09/19/2019 0950   ALBUMIN 3.4 (L) 03/22/2020 1050   ALBUMIN 4.3 09/19/2019 0950   AST 16 03/22/2020 1050   ALT 22 03/22/2020 1050   ALKPHOS 105 03/22/2020 1050   BILITOT 0.9 03/22/2020 1050   GFRNONAA 45 (L) 03/22/2020 1050  GFRAA 52 (L) 03/22/2020 1050       Component Value Date/Time   WBC 2.5  (L) 03/22/2020 1050   WBC 2.1 (L) 03/05/2020 0529   RBC 3.53 (L) 03/22/2020 1050   HGB 9.5 (L) 03/22/2020 1050   HGB 14.1 09/19/2019 0950   HCT 29.2 (L) 03/22/2020 1050   HCT 41.1 09/19/2019 0950   PLT 106 (L) 03/22/2020 1050   PLT 148 (L) 09/19/2019 0950   MCV 82.7 03/22/2020 1050   MCV 88 09/19/2019 0950   MCH 26.9 03/22/2020 1050   MCHC 32.5 03/22/2020 1050   RDW 16.4 (H) 03/22/2020 1050   RDW 14.0 09/19/2019 0950   LYMPHSABS 0.2 (L) 03/22/2020 1050   LYMPHSABS 1.4 09/19/2019 0950   MONOABS 0.3 03/22/2020 1050   EOSABS 0.0 03/22/2020 1050   EOSABS 0.1 09/19/2019 0950   BASOSABS 0.0 03/22/2020 1050   BASOSABS 0.0 09/19/2019 0950   -------------------------------  Imaging from last 24 hours (if applicable):  Radiology interpretation: CT Angio Chest PE W and/or Wo Contrast  Result Date: 03/03/2020 CLINICAL DATA:  Tonsillar cancer currently undergoing chemotherapy, fever EXAM: CT ANGIOGRAPHY CHEST WITH CONTRAST TECHNIQUE: Multidetector CT imaging of the chest was performed using the standard protocol during bolus administration of intravenous contrast. Multiplanar CT image reconstructions and MIPs were obtained to evaluate the vascular anatomy. CONTRAST:  40mL OMNIPAQUE IOHEXOL 350 MG/ML SOLN COMPARISON:  03/02/2020, 01/26/2020 FINDINGS: Cardiovascular: This is a technically adequate evaluation of the pulmonary vasculature. No filling defects or pulmonary emboli. Postsurgical changes are seen from previous coronary bypass surgery. No pericardial effusion. Aneurysmal dilatation of the ascending thoracic aorta measures approximately 4.7 cm just above the sino-tubular junction. No evidence of dissection. Mediastinum/Nodes: No enlarged mediastinal, hilar, or axillary lymph nodes. Thyroid gland, trachea, and esophagus demonstrate no significant findings. Lungs/Pleura: Hypoventilatory changes are noted within the lingula and bilateral lower lobes. No acute airspace disease, effusion, or  pneumothorax. Central airways are patent. A 4 mm subpleural right middle lobe nodule reference image 204 of series 5 is unchanged since 2017 and benign. No other pulmonary nodules or masses. Upper Abdomen: No acute abnormality. Musculoskeletal: Right chest wall port is identified via internal jugular approach, tip within the superior vena cava. There are no acute or destructive bony lesions. Reconstructed images demonstrate no additional findings. Review of the MIP images confirms the above findings. IMPRESSION: 1. No evidence of pulmonary embolus. 2. Bibasilar consolidation consistent with atelectasis. This accounts for the chest x-ray finding. 3. 4.7 cm ascending thoracic aortic aneurysm, previously measuring approximately 4.5 cm. Recommend semi-annual imaging followup by CTA or MRA and referral to cardiothoracic surgery if not already obtained. This recommendation follows 2010 ACCF/AHA/AATS/ACR/ASA/SCA/SCAI/SIR/STS/SVM Guidelines for the Diagnosis and Management of Patients With Thoracic Aortic Disease. Circulation. 2010; 121: J194-R740. Aortic aneurysm NOS (ICD10-I71.9) Electronically Signed   By: Randa Ngo M.D.   On: 03/03/2020 00:42   DG Chest Port 1 View  Result Date: 03/02/2020 CLINICAL DATA:  Fever. EXAM: PORTABLE CHEST 1 VIEW COMPARISON:  Nov 15, 2015 FINDINGS: There is a right-sided venous Port-A-Cath with its distal tip noted at the junction of the superior vena cava and right atrium. Multiple sternal wires are present. A 1.7 cm x 1.1 cm nodular opacity is seen overlying the lateral aspect of the left lung base. This represents a new finding when compared to the prior exam. Very mild left basilar atelectasis is also noted. There is no evidence of a pleural effusion or pneumothorax. The heart size and mediastinal contours are  within normal limits. The visualized skeletal structures are unremarkable. IMPRESSION: 1. 1.7 cm x 1.1 cm nodular opacity overlying the left lung base. Correlation with  chest CT is recommended to exclude the presence of an underlying lung mass. 2. Very mild left basilar atelectasis. 3. Evidence of prior median sternotomy. Electronically Signed   By: Virgina Norfolk M.D.   On: 03/02/2020 22:53   DG Swallowing Func-Speech Pathology  Result Date: 03/17/2020 Objective Swallowing Evaluation: Type of Study: MBS-Modified Barium Swallow Study  Patient Details Name: Toshiyuki Fredell MRN: [id number] Date of Birth: 1956-01-27 Today's Date: 03/17/2020 Time: SLP Start Time (ACUTE ONLY): 0920 -SLP Stop Time (ACUTE ONLY): 1004 SLP Time Calculation (min) (ACUTE ONLY): 44 min Past Medical History: Past Medical History: Diagnosis Date . Abnormal findings on cardiac catheterization 09/30/15  [facility] . Abnormal myocardial perfusion study 09/24/15  [facility] . CAD, multiple vessel 09/30/15  per CATH @ [facility]/DR.PARACHOS . Coronary artery disease  . GERD (gastroesophageal reflux disease)  . Gout  . H/O echocardiogram 09/24/15  [place] . Hard of hearing  . History of pneumonia  . Hyperlipidemia  . Hypertension  . Pneumonia  . Progressive angina ([place])  . 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 CARDIAC CATHETERIZATION N/A 09/30/2015  Procedure: Left Heart Cath and Coronary Angiography;  Surgeon: [name], MD;  Location: [place] CV LAB;  Service: Cardiovascular;  Laterality: N/A; . CORONARY ARTERY BYPASS GRAFT N/A 10/08/2015  Procedure: CORONARY ARTERY BYPASS GRAFTING time 4 using left internal mammary and right greater saphenous vein harvested by endovein;  Surgeon: [name], MD;  Location: [place];  Service: Open Heart Surgery;  Laterality: N/A; . CORONARY ARTERY BYPASS GRAFT   . IR GASTROSTOMY TUBE MOD SED  02/10/2020 . IR IMAGING GUIDED PORT INSERTION  02/10/2020 . MULTIPLE EXTRACTIONS WITH ALVEOLOPLASTY N/A 02/04/2020  Procedure: Extraction of tooth #'s [phone], and 32 with  alveoloplasty and gross debridement of remaining teeth.;  Surgeon: [name], DDS;  Location: [place];  Service: Oral Surgery;  Laterality: N/A; . TEE WITHOUT CARDIOVERSION N/A 10/08/2015  Procedure: TRANSESOPHAGEAL ECHOCARDIOGRAM (TEE);  Surgeon: [name], MD;  Location: [place];  Service: Open Heart Surgery;  Laterality: N/A; . VASECTOMY   . WISDOM TOOTH EXTRACTION   HPI:  64 yo male with right tonsillar cancer diagnosed 01/27/2020 undergoing chemoradiation and referred for MBS.  Pt with PMH also + for CAD s/p CABG x4 2017.  His treatment has been complicated by mucositis, oral candidiasis, hospital admission with n/v - dc'd 03/05/2020. He is s/p PEG and port placement 02/10/2020 and several dentition extraction with debridement 02/04/2020.  Pt has working with OP SLP and per notes, pt and daughter report pt has experienced coughing with intake x 4-6 weeks.  Reports more issues with swallowing foods than liquids.     Subjective: pt awake in chair, VERY HOH Assessment / Plan / Recommendation CHL IP CLINICAL IMPRESSIONS 03/17/2020 Clinical Impression Patient presents with minimal oral and moderately severe pharyngeal dysphagia due to his tonsillar cancer, surgical intervention, etc.  He primarily demonstrates decreased sustained laryngeal elevation and tongue base retraction resulting in vallecular retention with liquids, tablet and cracker and minimal penetration/aspiration of liquids.  Pt is mostly sensate to aspiration resulting in reflexive coughing *at times delayed- likely reflex not activated until bolus reached carina*.  Aspiration amount increased with sequential swallows of thin due to decreased sustained laryngeal closure.  In addition, oropharyngeal secretions mixed with barium - and trace aspiration of secretions noted.  Pudding bolus transited easily through oropharynx with only trace retention (approx. 90% cleared with initial swallow)- likely due to increased sensory input and thus muscular  contraction.  Pt had difficulties masticating cracker bolus due to only anterior dentition present and reported oral cracker retention throughout remainder of study.  Decreased propulsion of cracker bolus resulted in increased vallecular retention (approx. 50% of bolus cleared with initial swallow).  Pt was sensate to this and advised would consume liquids - however with liquid consumption - cracker bolus with barium did not transit into esophagus.  Head turn right with liquid did not decrease retention however dry swallows effective.  Pt also able to expectorate to clear vallecular retention during session and advises he conducts this at home.  Barium tablet swallowed without liquid (due to pt misunderstanding) and lodged at vallecular space without sensation and did not transit into esophagus with 2 thin liquid boluses.  Use of pudding and cue to "swallow fast and hard" picked up tablet and transited it into the esophagus. Upon esophageal sweep, pt appeared mostly clear but with minimal retrograde propulsion without awareness. Radiologist not present to confirm and MBS not designed to examine esophagus. Educated pt to results of testing and recommendations during MBS reviewing recommendation to drink water, follow solids with liquids, take medications with puree *start and follow with liquids for moisture*.  We also discussed pt's lack of sensation to barium tablet in vallecular region and thus indication to use caution - recommend pt expectorate at end of meal/po to assure pharynx is clear.  In addition, advise pt swish and expectorate after meals to assure oral clearance adequate.  Pt likely is having some low-grade chronic aspiration but with precautions including small single sips of liquids besides water, implementing compensations his nutrition and pna risk can be diminished significantly.  SLP had p SLP Visit Diagnosis Dysphagia, oropharyngeal phase (R13.12) Attention and concentration deficit following --  Frontal lobe and executive function deficit following -- Impact on safety and function Mild aspiration risk;Moderate aspiration risk;Risk for inadequate nutrition/hydration   No flowsheet data found.  No flowsheet data found. CHL IP DIET RECOMMENDATION 03/17/2020 SLP Diet Recommendations Dysphagia 3 (Mech soft) solids;Thin liquid Liquid Administration via Cup Medication Administration Whole meds with puree Compensations Slow rate;Small sips/bites;Multiple dry swallows after each bite/sip;Follow solids with liquid;Clear throat intermittently; WATER with MEALS, Start all intake with water, Expectorate at end of meal at least- even if do not sense retention Postural Changes Remain semi-upright after after feeds/meals (Comment);Seated upright at 90 degrees   CHL IP OTHER RECOMMENDATIONS 03/17/2020 Recommended Consults -- Oral Care Recommendations Oral care before and after PO Other Recommendations --   No flowsheet data found.  No flowsheet data found.     CHL IP ORAL PHASE 03/17/2020 Oral Phase Impaired Oral - Pudding Teaspoon -- Oral - Pudding Cup -- Oral - Honey Teaspoon -- Oral - Honey Cup -- Oral - Nectar Teaspoon WFL Oral - Nectar Cup [facility];Premature spillage Oral - Nectar Straw -- Oral - Thin Teaspoon WFL Oral - Thin Cup WFL Oral - Thin Straw WFL Oral - Puree WFL Oral - Mech Soft Lingual/palatal residue Oral - Regular -- Oral - Multi-Consistency -- Oral - Pill WFL Oral Phase - Comment --  CHL IP PHARYNGEAL PHASE 03/17/2020 Pharyngeal Phase Impaired Pharyngeal- Pudding Teaspoon -- Pharyngeal -- Pharyngeal- Pudding Cup -- Pharyngeal -- Pharyngeal- Honey Teaspoon -- Pharyngeal -- Pharyngeal- Honey Cup --  Pharyngeal -- Pharyngeal- Nectar Teaspoon Reduced epiglottic inversion;Reduced tongue base retraction;Pharyngeal residue - valleculae;Pharyngeal residue - pyriform Pharyngeal -- Pharyngeal- Nectar Cup Delayed swallow initiation-pyriform sinuses;Reduced epiglottic inversion;Reduced laryngeal elevation;Reduced  airway/laryngeal closure;Reduced tongue base retraction;Penetration/Aspiration during swallow;Pharyngeal residue - valleculae;Pharyngeal residue - pyriform Pharyngeal Material enters airway, remains ABOVE vocal cords and not ejected out Pharyngeal- Nectar Straw -- Pharyngeal -- Pharyngeal- Thin Teaspoon WFL Pharyngeal -- Pharyngeal- Thin Cup Reduced epiglottic inversion;Reduced laryngeal elevation;Reduced airway/laryngeal closure;Reduced tongue base retraction;Penetration/Aspiration during swallow;Penetration/Apiration after swallow;Trace aspiration Pharyngeal Material enters airway, passes BELOW cords and not ejected out despite cough attempt by patient Pharyngeal- Thin Straw Reduced epiglottic inversion;Reduced laryngeal elevation;Reduced airway/laryngeal closure;Reduced tongue base retraction;Penetration/Aspiration during swallow;Penetration/Apiration after swallow;Trace aspiration Pharyngeal Material enters airway, CONTACTS cords and then ejected out;Material enters airway, passes BELOW cords and not ejected out despite cough attempt by patient Pharyngeal- Puree WFL;Pharyngeal residue - valleculae Pharyngeal -- Pharyngeal- Mechanical Soft Reduced laryngeal elevation;Reduced airway/laryngeal closure;Reduced tongue base retraction;Pharyngeal residue - valleculae Pharyngeal -- Pharyngeal- Regular -- Pharyngeal -- Pharyngeal- Multi-consistency -- Pharyngeal -- Pharyngeal- Pill Reduced epiglottic inversion;Reduced laryngeal elevation;Reduced airway/laryngeal closure;Reduced tongue base retraction;Pharyngeal residue - valleculae Pharyngeal -- Pharyngeal Comment head turn right did not decrease retention in pharynx, dry swallows and "hock" and expectorate effective to expel vallecular retention, consumption of liquids after solids did not consisently clear pharyngeal retention and pt did NOT sense barium tablet lodged at vallecular region  CHL IP CERVICAL ESOPHAGEAL PHASE 03/17/2020 Cervical Esophageal Phase WFL Pudding  Teaspoon -- Pudding Cup -- Honey Teaspoon -- Honey Cup -- Nectar Teaspoon -- Nectar Cup -- Nectar Straw -- Thin Teaspoon -- Thin Cup -- Thin Straw -- Puree -- Mechanical Soft -- Regular -- Multi-consistency -- Pill -- Cervical Esophageal Comment -- Kathleen Lime, MS [facility] SLP Acute Rehab Services Office [phone] Macario Golds 03/17/2020, 11:15 AM

## 2020-03-22 NOTE — Progress Notes (Signed)
Patient Care Team: Chrismon, Vickki Muff, PA as PCP - General (Family Medicine) [name], MD as Consulting Physician (Cardiothoracic Surgery) [name], MD as Consulting Physician (Cardiology) Serena Colonel, RN as [place] Management [name], MD as Consulting Physician (Hematology and Oncology) [name], MD as Attending Physician (Radiation Oncology) [name], [name], RN as Oncology Nurse Navigator [name], [name], [place] as Speech Language Pathologist (Speech Pathology) [name], DDS as Consulting Physician (Dentistry) [name], [name], PT as Physical Therapist (Physical Therapy) [name], LCSW as Social Worker (General Practice) [name], LCSW as Social Worker [name], RD as Dietitian (Nutrition)  DIAGNOSIS:    ICD-10-CM   1. 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  Two right cervical lymph nodes metastases measuring 2.5 cm and 2.1 cm   02/17/2020 -  Chemotherapy   The patient had dexamethasone (DECADRON) 4 MG tablet, 8 mg, Oral, Daily, 1 of 1 cycle, Start date: 02/02/2020, End date: -- palonosetron (ALOXI) injection 0.25 mg, 0.25 mg, Intravenous,  Once, 2 of 3 cycles Administration: 0.25 mg (02/17/2020), 0.25 mg (03/02/2020) CISplatin (PLATINOL) 173 mg in sodium chloride 0.9 % 500 mL chemo infusion, 75 mg/m2 = 173 mg (100 % of original dose 75 mg/m2), Intravenous,  Once, 2 of 3 cycles Dose modification: 75 mg/m2 (original dose 75 mg/m2, Cycle 1, Reason: Provider Judgment), 60 mg/m2 (original dose 75 mg/m2, Cycle 2, Reason: Dose not tolerated) Administration: 173 mg (02/17/2020), 138 mg (03/02/2020) fosaprepitant (EMEND) 150 mg in sodium chloride 0.9 %  145 mL IVPB, 150 mg, Intravenous,  Once, 2 of 3 cycles Administration: 150 mg (02/17/2020), 150 mg (03/02/2020)  for chemotherapy treatment.    02/17/2020 Cancer Staging   Staging form: Pharynx - P16 Negative Oropharynx, AJCC 8th Edition - Clinical stage from 02/17/2020: Stage IVA (cT4a, cN2b, cM0, p16+) - Signed by [name], MD on 03/02/2020   Cancer of overlapping sites of tonsil [facility])  01/27/2020 Cancer Staging   Staging form: Pharynx - HPV-Mediated Oropharynx, AJCC 8th Edition - Clinical stage from 01/27/2020: Stage III (cT4, cN1, cM0, p16+) - Signed by [name], MD on 01/31/2020   01/31/2020 Initial Diagnosis   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[place])     CHIEF COMPLIANT: Cycle 3Day 1Cisplatin  INTERVAL HISTORY: Omar Christian is a 64 y.o. with above-mentioned history of tonsil cancercurrently on treatment with radiation and chemotherapy with cisplatin.He was admitted at [facility] from 03/02/20-03/05/20 after developing a fever following treatment. He was found to be septic and received vancomycin and Zosyn before being discharged on Augmentin. He presents to the clinic today for treatment.  His major complaint today is that his saliva is very thick and when he spits it out he gags sometimes.  He has not been taking too much fluids through his PEG tube   ALLERGIES:  has No Known Allergies.  MEDICATIONS:  Current Outpatient Medications  Medication Sig Dispense Refill  . allopurinol (ZYLOPRIM) 100 MG tablet Take 1 tablet (100 mg total) by mouth daily. 30 tablet 12  . atorvastatin (LIPITOR) 20 MG tablet Take 1 tablet (20 mg total) by mouth daily. 90 tablet 3  . dexamethasone (DECADRON) 4 MG tablet Take 2 tablets (8 mg total)  by mouth daily. Take daily for 3 days after chemo. Take with food. 20 tablet 0  . fluconazole (DIFLUCAN) 100 MG tablet Take 1 tablet (100 mg total) by mouth in the morning and at bedtime for 14 days. Take twice daily for 14 days. OK to crush and use in PEG Tube  28 tablet 0  . guaiFENesin-dextromethorphan (ROBITUSSIN DM) 100-10 MG/5ML syrup Take 5 mLs by mouth every 4 (four) hours as needed for cough. 118 mL 0  . lidocaine (XYLOCAINE) 2 % solution Patient: Mix 1part 2% viscous lidocaine, 1part H20. Swish & swallow 58mL of diluted mixture, 56min before eating and at bedtime, up to 5xs daily. 200 mL 4  . lidocaine-prilocaine (EMLA) cream Apply to affected area once 30 g 3  . LORazepam (ATIVAN) 0.5 MG tablet Take 1 tablet (0.5 mg total) by mouth at bedtime as needed (Nausea or vomiting). 30 tablet 0  . magic mouthwash w/lidocaine SOLN Take 5 mLs by mouth 5 (five) times daily as needed for mouth pain. 240 mL 1  . metoCLOPramide (REGLAN) 10 MG/10ML SOLN Take 5 mLs (5 mg total) by mouth 3 (three) times daily before meals. 1200 mL 0  . metoprolol succinate (TOPROL XL) 25 MG 24 hr tablet Take 1 tablet (25 mg total) by mouth daily. 30 tablet 3  . Nutritional Supplements (KATE FARMS STANDARD 1.4) LIQD 488 mLs by Gastrostomy Tube route 4 (four) times daily. 2730 mL 6  . nystatin (MYCOSTATIN) 100000 UNIT/ML suspension TAKE 5 MLS (500,000 UNITS TOTAL) BY MOUTH IN THE MORNING, AT NOON, AND AT BEDTIME. 60 mL 0  . ondansetron (ZOFRAN) 8 MG tablet Take 1 tablet (8 mg total) by mouth 2 (two) times daily as needed. Start on the third day after chemotherapy. 30 tablet 1  . oxyCODONE-acetaminophen (PERCOCET/ROXICET) 5-325 MG tablet Take 1 tablet by mouth every 4 (four) hours as needed.     . prochlorperazine (COMPAZINE) 10 MG tablet Take 1 tablet (10 mg total) by mouth every 6 (six) hours as needed (Nausea or vomiting). 30 tablet 1  . sodium fluoride (PREVIDENT 5000 PLUS) 1.1 % CREA dental cream Apply to tooth brush. Brush teeth for 2 minutes. Spit out excess-DO NOT swallow. DO NOT rinse afterwards. Repeat nightly. 51 g prn   No current facility-administered medications for this visit.   Facility-Administered Medications Ordered in Other Visits  Medication Dose Route Frequency  Provider Last Rate Last Admin  . sodium chloride flush (NS) 0.9 % injection 10 mL  10 mL Intravenous PRN Sandi Mealy E., PA-C   10 mL at 03/22/20 1049    PHYSICAL EXAMINATION: ECOG PERFORMANCE STATUS: 1 - Symptomatic but completely ambulatory  Vitals:   03/23/20 0900  BP: 132/73  Pulse: (!) 48  Resp: 18  Temp: 97.7 F (36.5 C)  SpO2: 99%   Filed Weights   03/23/20 0900  Weight: 199 lb 1.6 oz (90.3 kg)    LABORATORY DATA:  I have reviewed the data as listed CMP Latest Ref Rng & Units 03/22/2020 03/09/2020 03/05/2020  Glucose 70 - 99 mg/dL 129(H) 154(H) 130(H)  BUN 8 - 23 mg/dL 20 29(H) 38(H)  Creatinine 0.61 - 1.24 mg/dL 1.62(H) 1.38(H) 1.81(H)  Sodium 135 - 145 mmol/L 136 135 131(L)  Potassium 3.5 - 5.1 mmol/L 3.6 3.5 3.5  Chloride 98 - 111 mmol/L 98 97(L) 94(L)  CO2 22 - 32 mmol/L 27 28 27   Calcium 8.9 - 10.3 mg/dL 9.5 8.9 8.3(L)  Total Protein 6.5 - 8.1 g/dL 6.8 6.4(L) -  Total Bilirubin 0.3 - 1.2 mg/dL 0.9 0.4 -  Alkaline Phos 38 - 126 U/L 105 89 -  AST 15 - 41 U/L 16 17 -  ALT 0 - 44 U/L 22 29 -    Lab Results  Component Value Date   WBC 2.5 (L) 03/22/2020   HGB 9.5 (L) 03/22/2020   HCT 29.2 (L) 03/22/2020   MCV 82.7 03/22/2020   PLT 106 (L) 03/22/2020   NEUTROABS 1.9 03/22/2020    ASSESSMENT & PLAN:  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 Two right cervical lymph nodesmetastases measuring 2.5 cm and 2.1 cm T4N2BM0 stage IVa HPV status:Positive  Treatment plan:Concurrent chemoradiation with cisplatin every 3 weeks x4. Today iscycle 3 Patient gets IV fluids today Bradycardia: Cardiology referral (previously he had bypass surgery)  Chemo toxicities: 1.Weight loss:  on PEG tube feeds. 2.Fatigue 3.Worsening hearing impairment patient understands the risks of continuing cisplatin and wants to push  forward. 4.Nausea  5.  Dehydration: He is getting IV fluids twice every week.    Following with nutritionist/dietitian  6.  Renal insufficiency: Due to dehydration 7.  Thrombocytopenia due to lingering effects of chemotherapy.  I will reduce the dosage of cisplatin today. 8.  Thrush: Prescribed Diflucan yesterday 9.  Hospitalization at [facility] for sepsis after last chemo  RTC  3 times a week for IV fluids I will see him back in 2 weeks. Labs will be done weekly.   No orders of the defined types were placed in this encounter.  The patient has a good understanding of the overall plan. he agrees with it. he will call with any problems that may develop before the next visit here.  Total time spent: 30 mins including face to face time and time spent for planning, charting and coordination of care  [name], MD 03/23/2020  I, Cloyde Reams Dorshimer, am acting as scribe for Dr. [name].  I have reviewed the above documentation for accuracy and completeness, and I agree with the above.

## 2020-03-23 ENCOUNTER — Inpatient Hospital Stay: Payer: 59

## 2020-03-23 ENCOUNTER — Other Ambulatory Visit: Payer: Self-pay

## 2020-03-23 ENCOUNTER — Ambulatory Visit
Admission: RE | Admit: 2020-03-23 | Discharge: 2020-03-23 | Disposition: A | Discharge status: Home / Self Care | Payer: 59 | Source: Ambulatory Visit | Attending: Radiation Oncology | Admitting: Radiation Oncology

## 2020-03-23 ENCOUNTER — Inpatient Hospital Stay (HOSPITAL_BASED_OUTPATIENT_CLINIC_OR_DEPARTMENT_OTHER): Payer: 59 | Admitting: Hematology and Oncology

## 2020-03-23 ENCOUNTER — Inpatient Hospital Stay: Payer: 59 | Admitting: Nutrition

## 2020-03-23 DIAGNOSIS — R1313 Dysphagia, pharyngeal phase: Secondary | ICD-10-CM | POA: Diagnosis not present

## 2020-03-23 DIAGNOSIS — C099 Malignant neoplasm of tonsil, unspecified: Secondary | ICD-10-CM | POA: Diagnosis not present

## 2020-03-23 MED ORDER — SODIUM CHLORIDE 0.9% FLUSH
10.0000 mL | INTRAVENOUS | Status: DC | PRN
  Administered 2020-03-23: 10 mL
  Filled 2020-03-23: qty 10

## 2020-03-23 MED ORDER — PALONOSETRON HCL INJECTION 0.25 MG/5ML
0.2500 mg | Freq: Once | INTRAVENOUS | Status: AC
  Administered 2020-03-23: 0.25 mg via INTRAVENOUS

## 2020-03-23 MED ORDER — HEPARIN SOD (PORK) LOCK FLUSH 100 UNIT/ML IV SOLN
500.0000 [IU] | Freq: Once | INTRAVENOUS | Status: AC | PRN
  Administered 2020-03-23: 500 [IU]
  Filled 2020-03-23: qty 5

## 2020-03-23 MED ORDER — PALONOSETRON HCL INJECTION 0.25 MG/5ML
INTRAVENOUS | Status: AC
  Filled 2020-03-23: qty 5

## 2020-03-23 MED ORDER — SODIUM CHLORIDE 0.9 % IV SOLN
150.0000 mg | Freq: Once | INTRAVENOUS | Status: AC
  Administered 2020-03-23: 150 mg via INTRAVENOUS
  Filled 2020-03-23: qty 150

## 2020-03-23 MED ORDER — SODIUM CHLORIDE 0.9 % IV SOLN
INTRAVENOUS | Status: DC
  Filled 2020-03-23 (×2): qty 250

## 2020-03-23 MED ORDER — SODIUM CHLORIDE 0.9 % IV SOLN
60.0000 mg/m2 | Freq: Once | INTRAVENOUS | Status: AC
  Administered 2020-03-23: 138 mg via INTRAVENOUS
  Filled 2020-03-23: qty 138

## 2020-03-23 MED ORDER — SODIUM CHLORIDE 0.9 % IV SOLN
10.0000 mg | Freq: Once | INTRAVENOUS | Status: AC
  Administered 2020-03-23: 10 mg via INTRAVENOUS
  Filled 2020-03-23: qty 10

## 2020-03-23 MED ORDER — SODIUM CHLORIDE 0.9 % IV SOLN
Freq: Once | INTRAVENOUS | Status: AC
  Filled 2020-03-23: qty 10

## 2020-03-23 NOTE — Patient Instructions (Signed)
[facility] Discharge Instructions for Patients Receiving Chemotherapy  Today you received the following chemotherapy agents Cisplatin  To help prevent nausea and vomiting after your treatment, we encourage you to take your nausea medication as directed  If you develop nausea and vomiting that is not controlled by your nausea medication, call the clinic.   BELOW ARE SYMPTOMS THAT SHOULD BE REPORTED IMMEDIATELY:  *FEVER GREATER THAN 100.5 F  *CHILLS WITH OR WITHOUT FEVER  NAUSEA AND VOMITING THAT IS NOT CONTROLLED WITH YOUR NAUSEA MEDICATION  *UNUSUAL SHORTNESS OF BREATH  *UNUSUAL BRUISING OR BLEEDING  TENDERNESS IN MOUTH AND THROAT WITH OR WITHOUT PRESENCE OF ULCERS  *URINARY PROBLEMS  *BOWEL PROBLEMS  UNUSUAL RASH Items with * indicate a potential emergency and should be followed up as soon as possible.  Feel free to call the clinic should you have any questions or concerns. The clinic phone number is [phone].  Please show the CHEMO ALERT CARD at check-in to the Emergency Department and triage nurse.   

## 2020-03-23 NOTE — Progress Notes (Signed)
After 2 hours of pre hydration for Cisplatin, patient's urine output is 100 ml. Dr. Lindi Adie made aware. Per Dr. Lindi Adie, patient to receive an additional 1 liter bag of normal saline over 1 hour. Orders entered.

## 2020-03-23 NOTE — Assessment & Plan Note (Signed)
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 Two right cervical lymph nodesmetastases measuring 2.5 cm and 2.1 cm T4N2BM0 stage IVa HPV status:Positive  Treatment plan:Concurrent chemoradiation with cisplatin every 3 weeks x4. Today iscycle 3 Patient gets IV fluids today HTN H/O gout: treated with colchicine.  Chemo toxicities: 1.Weight loss:  on PEG tube feeds. 2.Fatigue 3.Worsening hearing impairment patient understands the risks of continuing cisplatin and wants to push forward. 4.Nausea  5.  Dehydration: He is getting IV fluids twice every week.    Following with nutritionist/dietitian  6.  Renal insufficiency: Due to dehydration 7.  Thrombocytopenia due to lingering effects of chemotherapy.  I will reduce the dosage of cisplatin today. 8.  Thrush: Prescribed Diflucan yesterday  RTC  3 times a week for IV fluids

## 2020-03-23 NOTE — Progress Notes (Signed)
Nutrition follow-up completed with patient receiving treatment for tonsil cancer. Weight decreased and documented as 198.3 pounds on September 20.  This is decreased from 219.5 pounds August 17. Patient reports he has been having very thick saliva. He is not able to eat or drink anything. Reports occasional vomiting. Reports he is using [employer] via feeding tube.  His family helps him to administer it and he does not know how much he is getting.  He does voice there is no time to get 6 cartons daily.  Nutrition diagnosis: Unintentional weight loss continues.  Intervention: Educated patient on the importance of giving 1-1/2 cartons of [employer] 1.4 4 times daily with 60 mL of free water before and after bolus feeding.  Recommend additional 240 mL free water flushes 4 times a day between feedings. This would provide 2730 cal, 120 g protein, 2844 mL free water.  This would be 100% estimated nutrition needs. Stressed importance of tube feeding compliance.  Encouraged patient to allow his family to help him with tube feeding administration I also encouraged him to bring formula to his treatments so he could have a feeding while he was here.  Monitoring, evaluation, goals: Patient will increase tube feeding to meet greater than 90% estimated nutrition needs to minimize weight loss.  Next visit: Tuesday, September 28 during infusion.  **Disclaimer: This note was dictated with voice recognition software. Similar sounding words can inadvertently be transcribed and this note may contain transcription errors which may not have been corrected upon publication of note.**

## 2020-03-24 ENCOUNTER — Other Ambulatory Visit: Payer: Self-pay

## 2020-03-24 ENCOUNTER — Telehealth: Payer: Self-pay | Admitting: Hematology and Oncology

## 2020-03-24 ENCOUNTER — Ambulatory Visit
Admission: RE | Admit: 2020-03-24 | Discharge: 2020-03-24 | Disposition: A | Discharge status: Home / Self Care | Payer: 59 | Source: Ambulatory Visit | Attending: Radiation Oncology | Admitting: Radiation Oncology

## 2020-03-24 DIAGNOSIS — R1313 Dysphagia, pharyngeal phase: Secondary | ICD-10-CM | POA: Diagnosis not present

## 2020-03-24 NOTE — Telephone Encounter (Signed)
Scheduled per 9/21 los. Pt will receive an updated appt calendar at next visit, per appt notes

## 2020-03-25 ENCOUNTER — Inpatient Hospital Stay: Payer: 59

## 2020-03-25 ENCOUNTER — Other Ambulatory Visit: Payer: Self-pay

## 2020-03-25 ENCOUNTER — Ambulatory Visit
Admission: RE | Admit: 2020-03-25 | Discharge: 2020-03-25 | Disposition: A | Discharge status: Home / Self Care | Payer: 59 | Source: Ambulatory Visit | Attending: Radiation Oncology | Admitting: Radiation Oncology

## 2020-03-25 VITALS — BP 150/71 | HR 65 | Resp 17

## 2020-03-25 DIAGNOSIS — C099 Malignant neoplasm of tonsil, unspecified: Secondary | ICD-10-CM

## 2020-03-25 DIAGNOSIS — R1313 Dysphagia, pharyngeal phase: Secondary | ICD-10-CM | POA: Diagnosis not present

## 2020-03-25 MED ORDER — SODIUM CHLORIDE 0.9% FLUSH
10.0000 mL | Freq: Once | INTRAVENOUS | Status: AC
  Administered 2020-03-25: 10 mL via INTRAVENOUS
  Filled 2020-03-25: qty 10

## 2020-03-25 MED ORDER — SODIUM CHLORIDE 0.9 % IV SOLN
Freq: Once | INTRAVENOUS | Status: AC
  Filled 2020-03-25: qty 250

## 2020-03-25 MED ORDER — HEPARIN SOD (PORK) LOCK FLUSH 100 UNIT/ML IV SOLN
500.0000 [IU] | Freq: Once | INTRAVENOUS | Status: AC
  Administered 2020-03-25: 500 [IU] via INTRAVENOUS
  Filled 2020-03-25: qty 5

## 2020-03-25 NOTE — Telephone Encounter (Signed)
No entry 

## 2020-03-25 NOTE — Patient Instructions (Signed)

## 2020-03-26 ENCOUNTER — Other Ambulatory Visit: Payer: Self-pay

## 2020-03-26 ENCOUNTER — Ambulatory Visit
Admission: RE | Admit: 2020-03-26 | Discharge: 2020-03-26 | Disposition: A | Discharge status: Home / Self Care | Payer: 59 | Source: Ambulatory Visit | Attending: Radiation Oncology | Admitting: Radiation Oncology

## 2020-03-26 ENCOUNTER — Other Ambulatory Visit: Payer: Self-pay | Admitting: *Deleted

## 2020-03-26 ENCOUNTER — Encounter: Payer: Self-pay | Admitting: Hematology and Oncology

## 2020-03-26 DIAGNOSIS — R1313 Dysphagia, pharyngeal phase: Secondary | ICD-10-CM | POA: Diagnosis not present

## 2020-03-26 NOTE — Patient Outreach (Signed)
[facility]) Care Management  03/26/2020  Omar Christian April 19, 1956 [id number]   Subjective:  Per patient's previous consent,patient gave this RNCM verbal consent to speak with his wife Omar Christian), and daughter Omar Christian) regarding his healthcare needs as needed. Telephone call to patient's home  / mobile number, no answer, message states mailbox full, and unable to accept messages.    Objective: Per KPN (Knowledge Performance Now, point of care tool) and chart review,patient hospitalized 03/02/2020 - 03/05/2020 for sepsis.  Patient had an  observation hospitalization 8/4/201 - 02/05/2020 for Poor dentition, status post Multiple extraction of tooth numbers4,5,12,13,31, and 32, 3quadrants of alveoloplasty, Grossdebridement of remaining teeth on 02/04/2020. Patient had ED visit on 01/11/2020 for dental pain, right facial/ neck pain/ swelling, and oral cancer. 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     Assessment:  Received [employer] plan referral on 01/21/2020. 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 Screening follow up completed andwill follow up for new cancer diagnosis/ other diagnosiscarecoordination.    Plan:  RNCM will call patient's wife and / or patient's daughter Omar Christian)fortelephone outreach attempt within4business days, care coordination follow up, and proceed with case closure, after 4th unsuccessful outreach call.       Shakari Qazi H. Annia Friendly, BSN, [place] Management [facility] Telephonic CM Phone: [phone] Fax: [phone]

## 2020-03-27 ENCOUNTER — Other Ambulatory Visit: Payer: Self-pay

## 2020-03-27 ENCOUNTER — Inpatient Hospital Stay: Payer: 59

## 2020-03-27 VITALS — BP 120/71 | HR 92 | Temp 98.2°F | Resp 18

## 2020-03-27 DIAGNOSIS — C099 Malignant neoplasm of tonsil, unspecified: Secondary | ICD-10-CM

## 2020-03-27 DIAGNOSIS — R1313 Dysphagia, pharyngeal phase: Secondary | ICD-10-CM | POA: Diagnosis not present

## 2020-03-27 MED ORDER — HEPARIN SOD (PORK) LOCK FLUSH 100 UNIT/ML IV SOLN
500.0000 [IU] | INTRAVENOUS | Status: AC | PRN
  Administered 2020-03-27: 500 [IU]
  Filled 2020-03-27: qty 5

## 2020-03-27 MED ORDER — SODIUM CHLORIDE 0.9 % IV SOLN
Freq: Once | INTRAVENOUS | Status: AC
  Filled 2020-03-27: qty 250

## 2020-03-27 MED ORDER — SODIUM CHLORIDE 0.9% FLUSH
10.0000 mL | INTRAVENOUS | Status: AC | PRN
  Administered 2020-03-27: 10 mL
  Filled 2020-03-27: qty 10

## 2020-03-27 NOTE — Patient Instructions (Signed)
Dehydration, Adult Dehydration is a condition in which there is not enough water or other fluids in the body. This happens when a person loses more fluids than he or she takes in. Important organs, such as the kidneys, brain, and heart, cannot function without a proper amount of fluids. Any loss of fluids from the body can lead to dehydration. Dehydration can be mild, moderate, or severe. It should be treated right away to prevent it from becoming severe. What are the causes? Dehydration may be caused by:  Conditions that cause loss of water or other fluids, such as diarrhea, vomiting, or sweating or urinating a lot.  Not drinking enough fluids, especially when you are ill or doing activities that require a lot of energy.  Other illnesses and conditions, such as fever or infection.  Certain medicines, such as medicines that remove excess fluid from the body (diuretics).  Lack of safe drinking water.  Not being able to get enough water and food. What increases the risk? The following factors may make you more likely to develop this condition:  Having a long-term (chronic) illness that has not been treated properly, such as diabetes, heart disease, or kidney disease.  Being 65 years of age or older.  Having a disability.  Living in a place that is high in altitude, where thinner, drier air causes more fluid loss.  Doing exercises that put stress on your body for a long time (endurance sports). What are the signs or symptoms? Symptoms of dehydration depend on how severe it is. Mild or moderate dehydration  Thirst.  Dry lips or dry mouth.  Dizziness or light-headedness, especially when standing up from a seated position.  Muscle cramps.  Dark urine. Urine may be the color of tea.  Less urine or tears produced than usual.  Headache. Severe dehydration  Changes in skin. Your skin may be cold and clammy, blotchy, or pale. Your skin also may not return to normal after being  lightly pinched and released.  Little or no tears, urine, or sweat.  Changes in vital signs, such as rapid breathing and low blood pressure. Your pulse may be weak or may be faster than 100 beats a minute when you are sitting still.  Other changes, such as: ? Feeling very thirsty. ? Sunken eyes. ? Cold hands and feet. ? Confusion. ? Being very tired (lethargic) or having trouble waking from sleep. ? Short-term weight loss. ? Loss of consciousness. How is this diagnosed? This condition is diagnosed based on your symptoms and a physical exam. You may have blood and urine tests to help confirm the diagnosis. How is this treated? Treatment for this condition depends on how severe it is. Treatment should be started right away. Do not wait until dehydration becomes severe. Severe dehydration is an emergency and needs to be treated in a hospital.  Mild or moderate dehydration can be treated at home. You may be asked to: ? Drink more fluids. ? Drink an oral rehydration solution (ORS). This drink helps restore proper amounts of fluids and salts and minerals in the blood (electrolytes).  Severe dehydration can be treated: ? With IV fluids. ? By correcting abnormal levels of electrolytes. This is often done by giving electrolytes through a tube that is passed through your nose and into your stomach (nasogastric tube, or NG tube). ? By treating the underlying cause of dehydration. Follow these instructions at home: Oral rehydration solution If told by your health care provider, drink an ORS:  Make   an ORS by following instructions on the package.  Start by drinking small amounts, about  cup (120 mL) every 5-10 minutes.  Slowly increase how much you drink until you have taken the amount recommended by your health care provider. Eating and drinking         Drink enough clear fluid to keep your urine pale yellow. If you were told to drink an ORS, finish the ORS first and then start slowly  drinking other clear fluids. Drink fluids such as: ? Water. Do not drink only water. Doing that can lead to hyponatremia, which is having too little salt (sodium) in the body. ? Water from ice chips you suck on. ? Fruit juice that you have added water to (diluted fruit juice). ? Low-calorie sports drinks.  Eat foods that contain a healthy balance of electrolytes, such as bananas, oranges, potatoes, tomatoes, and spinach.  Do not drink alcohol.  Avoid the following: ? Drinks that contain a lot of sugar. These include high-calorie sports drinks, fruit juice that is not diluted, and soda. ? Caffeine. ? Foods that are greasy or contain a lot of fat or sugar. General instructions  Take over-the-counter and prescription medicines only as told by your health care provider.  Do not take sodium tablets. Doing that can lead to having too much sodium in the body (hypernatremia).  Return to your normal activities as told by your health care provider. Ask your health care provider what activities are safe for you.  Keep all follow-up visits as told by your health care provider. This is important. Contact a health care provider if:  You have muscle cramps, pain, or discomfort, such as: ? Pain in your abdomen and the pain gets worse or stays in one area (localizes). ? Stiff neck.  You have a rash.  You are more irritable than usual.  You are sleepier or have a harder time waking than usual.  You feel weak or dizzy.  You feel very thirsty. Get help right away if you have:  Any symptoms of severe dehydration.  Symptoms of vomiting, such as: ? You cannot eat or drink without vomiting. ? Vomiting gets worse or does not go away. ? Vomit includes blood or green matter (bile).  Symptoms that get worse with treatment.  A fever.  A severe headache.  Problems with urination or bowel movements, such as: ? Diarrhea that gets worse or does not go away. ? Blood in your stool (feces). This  may cause stool to look black and tarry. ? Not urinating, or urinating only a small amount of very dark urine, within 6-8 hours.  Trouble breathing. These symptoms may represent a serious problem that is an emergency. Do not wait to see if the symptoms will go away. Get medical help right away. Call your local emergency services (911 in the [place] Do not drive yourself to the hospital. Summary  Dehydration is a condition in which there is not enough water or other fluids in the body. This happens when a person loses more fluids than he or she takes in.  Treatment for this condition depends on how severe it is. Treatment should be started right away. Do not wait until dehydration becomes severe.  Drink enough clear fluid to keep your urine pale yellow. If you were told to drink an oral rehydration solution (ORS), finish the ORS first and then start slowly drinking other clear fluids.  Take over-the-counter and prescription medicines only as told by your health care   provider.  Get help right away if you have any symptoms of severe dehydration. This information is not intended to replace advice given to you by your health care provider. Make sure you discuss any questions you have with your health care provider. Document Revised: 01/30/2019 Document Reviewed: 01/30/2019 Elsevier Patient Education  2020 [employer]   

## 2020-03-29 ENCOUNTER — Ambulatory Visit
Admission: RE | Admit: 2020-03-29 | Discharge: 2020-03-29 | Disposition: A | Discharge status: Home / Self Care | Payer: 59 | Source: Ambulatory Visit | Attending: Radiation Oncology | Admitting: Radiation Oncology

## 2020-03-29 ENCOUNTER — Other Ambulatory Visit: Payer: Self-pay

## 2020-03-29 ENCOUNTER — Other Ambulatory Visit: Payer: Self-pay | Admitting: *Deleted

## 2020-03-29 DIAGNOSIS — R1313 Dysphagia, pharyngeal phase: Secondary | ICD-10-CM | POA: Diagnosis not present

## 2020-03-29 NOTE — Patient Outreach (Signed)
[facility]) Care Management  [place]  03/29/2020   Omar Christian April 08, 1956 [id number]  Subjective: Per patient's previous consent,patient gave this RNCM verbal consent to speak with his wife Jevante Hollibaugh), and daughter [name] regarding his healthcare needs as needed.Telephone call from patient's home / mobile number, spoke with patient's wife Dashawn Bartnick), she stated patient's name, date of birth, and address.  States she received some encouraging news today from patient's daughter [name] who has been accompanying patient to his treatment appointments.  States patient's cancer is shrinking, and he is doing well overall.   Wife states patient had a recent hospitalization for dehydration after cancer treatment,  and an infection of unknown origin.  States he continues to have thrush and coughing up phlegm in mouth, patient daughter will discuss with providers during tomorrow's treatment.  States he continues to have treatments 6 days per week ( combination therapy of radiation, chemo, and infusions).   Wife states she is aware of signs/ symptoms to report, how to reach provider if needed after hours, when to go to ED, and / or call 911.   States patient has 2 more weeks of treatment, MD will evaluate, and determine next steps.  Wife states she is waiting for a call from Margarita Grizzle at [facility] to assist with patient's disability application and is planning to contact [place] Social Worker [facility] [employer] [place]) [phone]) for contact information for Margarita Grizzle.   RNCM advised wife, moving to another position after 04/15/2020, patient will be transitioned to another [facility] in the future.  RNCM will call wife for 1 additional outreach prior to transition, per wife's request, and wife in agreement to transition.  Wife  states patient does not have any Air traffic controller,  transportation, Data processing manager, or pharmacy needs at this time.  States she is very  appreciative of the follow up and is in agreement to continue to receive [place] Management information / services.  States she has this RNCM's and [place] Management contact information, will call if assistance needed prior to next patent outreach.        Objective: Per KPN (Knowledge Performance Now, point of care tool) and chart review,patient hospitalized 03/02/2020 - 03/05/2020 for sepsis.  Patient had an  observation hospitalization 8/4/201 - 02/05/2020 for Poor dentition, status post Multiple extraction of tooth numbers4,5,12,13,31, and 32, 3quadrants of alveoloplasty, Grossdebridement of remaining teeth on 02/04/2020. Patient had ED visit on 01/11/2020 for dental pain, right facial/ neck pain/ swelling, and oral cancer. 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     Encounter Medications:  Outpatient Encounter Medications as of 03/29/2020  Medication Sig  . allopurinol (ZYLOPRIM) 100 MG tablet Take 1 tablet (100 mg total) by mouth daily.  Marland Kitchen atorvastatin (LIPITOR) 20 MG tablet Take 1 tablet (20 mg total) by mouth daily.  Marland Kitchen dexamethasone (DECADRON) 4 MG tablet Take 2 tablets (8 mg total) by mouth daily. Take daily for 3 days after chemo. Take with food.  . fluconazole (DIFLUCAN) 100 MG tablet Take 1 tablet (100 mg total) by mouth in the morning and at bedtime for 14 days. Take twice daily for 14 days. OK to crush and use in PEG Tube  . guaiFENesin-dextromethorphan (ROBITUSSIN DM) 100-10 MG/5ML syrup Take 5 mLs by mouth every 4 (four) hours as needed for cough.  . lidocaine (XYLOCAINE) 2 % solution Patient: Mix 1part 2% viscous lidocaine, 1part  H20. Swish & swallow 57mL of diluted mixture, 88min before eating and at bedtime, up to 5xs daily.  Marland Kitchen lidocaine-prilocaine (EMLA) cream Apply to affected area once  . LORazepam (ATIVAN) 0.5 MG tablet Take 1 tablet (0.5 mg total) by mouth at bedtime  as needed (Nausea or vomiting).  . magic mouthwash w/lidocaine SOLN Take 5 mLs by mouth 5 (five) times daily as needed for mouth pain.  Marland Kitchen metoCLOPramide (REGLAN) 10 MG/10ML SOLN Take 5 mLs (5 mg total) by mouth 3 (three) times daily before meals.  . metoprolol succinate (TOPROL XL) 25 MG 24 hr tablet Take 1 tablet (25 mg total) by mouth daily.  . Nutritional Supplements (KATE FARMS STANDARD 1.4) LIQD 488 mLs by Gastrostomy Tube route 4 (four) times daily.  Marland Kitchen nystatin (MYCOSTATIN) 100000 UNIT/ML suspension TAKE 5 MLS (500,000 UNITS TOTAL) BY MOUTH IN THE MORNING, AT NOON, AND AT BEDTIME.  Marland Kitchen ondansetron (ZOFRAN) 8 MG tablet Take 1 tablet (8 mg total) by mouth 2 (two) times daily as needed. Start on the third day after chemotherapy.  Marland Kitchen oxyCODONE-acetaminophen (PERCOCET/ROXICET) 5-325 MG tablet Take 1 tablet by mouth every 4 (four) hours as needed.   . prochlorperazine (COMPAZINE) 10 MG tablet Take 1 tablet (10 mg total) by mouth every 6 (six) hours as needed (Nausea or vomiting).  . sodium fluoride (PREVIDENT 5000 PLUS) 1.1 % CREA dental cream Apply to tooth brush. Brush teeth for 2 minutes. Spit out excess-DO NOT swallow. DO NOT rinse afterwards. Repeat nightly.   No facility-administered encounter medications on file as of 03/29/2020.    Functional Status:  In your present state of health, do you have any difficulty performing the following activities: 03/04/2020  Hearing? Y  Vision? N  Difficulty concentrating or making decisions? N  Walking or climbing stairs? N  Dressing or bathing? N  Doing errands, shopping? N  Some recent data might be hidden    Fall/Depression Screening: Fall Risk  02/27/2020 01/26/2020 09/16/2019  Falls in the past year? 0 0 0  Number falls in past yr: 0 0 -  Injury with Fall? 0 0 -  Risk for fall due to : No Fall Risks Other (Comment) -  Risk for fall due to: Comment - New cancer diagnosis -  Follow up Falls evaluation completed;Education provided Falls prevention  discussed;Education provided -   PHQ 2/9 Scores 01/26/2020 09/16/2019  PHQ - 2 Score 0 0    Assessment: Received [employer] plan referral on 01/21/2020. 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 Screening follow up completed andwill follow up for new cancer diagnosis/ other diagnosiscarecoordination.  Goals Addressed            This Visit's Progress   . Patient's wife stated  patient would be fine if he did not have cancer and wants the best care.       CARE PLAN ENTRY (see longitudinal plan of care for additional care plan information)  Current Barriers:  . Care Coordination needs related to new oral cancer  in a patient with hypertension and gout. (disease states)  Nurse Case Manager Clinical Goal(s):  Marland Kitchen Over the next 45 days, patient will verbalize understanding of plan for oral cancer treatment . Over the next 21 days, patient will attend all scheduled medical appointments: Radiation Oncologist MD, Medical Oncologist .  Interventions:  . Inter-disciplinary care team collaboration (see longitudinal plan of care) . Evaluation of current treatment plan related to cancer and patient's adherence to plan as established by provider. Marland Kitchen  Provided education to patient and/or caregiver about advanced directives . Provided education to patient re: [place] Management services and care coordination services, how to access insurance benefits . Reviewed medications with patient and discussed new medications that have been added since last patient outreach. . Discussed plans with patient for ongoing care management follow up and provided patient with direct contact information for care management team . Provided patient with [facility] handout educational materials related to organ donation per patient request . Reviewed scheduled/upcoming provider appointments including: Radiation Oncologist and Medical Oncologist  Patient Self Care Activities:  . Attends all  scheduled provider appointments . Performs ADL's independently . Patient working on maintaining hydration and increasing appetite . Patient is withdrawn and does not want communicate much per wife.  Updated 03/29/2020        Plan: RNCM will call patient's wife and / or patient's daughter [name])fortelephone outreach attempt within21business days, care coordination follow up, and proceed with case closure, after 4th unsuccessful outreach call.       Damarie Schoolfield H. Annia Friendly, BSN, [place] Management [facility] Telephonic CM Phone: [phone] Fax: [phone]

## 2020-03-30 ENCOUNTER — Inpatient Hospital Stay: Payer: 59

## 2020-03-30 ENCOUNTER — Ambulatory Visit
Admission: RE | Admit: 2020-03-30 | Discharge: 2020-03-30 | Disposition: A | Discharge status: Home / Self Care | Payer: 59 | Source: Ambulatory Visit | Attending: Radiation Oncology | Admitting: Radiation Oncology

## 2020-03-30 VITALS — BP 112/71 | HR 88 | Temp 99.5°F | Resp 18

## 2020-03-30 DIAGNOSIS — R1313 Dysphagia, pharyngeal phase: Secondary | ICD-10-CM | POA: Diagnosis not present

## 2020-03-30 DIAGNOSIS — C099 Malignant neoplasm of tonsil, unspecified: Secondary | ICD-10-CM

## 2020-03-30 DIAGNOSIS — Z95828 Presence of other vascular implants and grafts: Secondary | ICD-10-CM

## 2020-03-30 MED ORDER — SODIUM CHLORIDE 0.9% FLUSH
10.0000 mL | Freq: Once | INTRAVENOUS | Status: AC
  Administered 2020-03-30: 10 mL
  Filled 2020-03-30: qty 10

## 2020-03-30 MED ORDER — HEPARIN SOD (PORK) LOCK FLUSH 100 UNIT/ML IV SOLN
500.0000 [IU] | Freq: Once | INTRAVENOUS | Status: AC
  Administered 2020-03-30: 500 [IU]
  Filled 2020-03-30: qty 5

## 2020-03-30 MED ORDER — SODIUM CHLORIDE 0.9 % IV SOLN
Freq: Once | INTRAVENOUS | Status: AC
  Filled 2020-03-30: qty 250

## 2020-03-30 NOTE — Progress Notes (Signed)
Nutrition Follow-up:  Patient with tonsil cancer.  Patient receiving radiation and chemotherapy.    Met with patient in infusion.  Patient reports that he is taking more nutrition via feeding tube.  Reports that he is taking at carton of tube feeding at 6am, noon, 4pm and 8pm.  Family is giving feeding to him.  Says that he is not eating anything by mouth.  Taking few sips of liquids.  Is not giving additional fluids in tube other than with feeding.    Patient ask that RD speak with [name], daughter out in waiting area.    RD went out to find [place] and she had left building.      Medications: reviewed  Labs: reviewed  Anthropometrics:   Weight 199 lb 1.6 oz on 9/21   219 lb 5 oz on 8/17  Estimated Energy Needs  Kcals: 7395-8441 Protein: 115-130 g Fluid: >2.4 L  NUTRITION DIAGNOSIS: Unintentional weight loss stable   INTERVENTION:  Recommend increase [employer] 1.4 to 1 1/2 cartons 4 times per day.  Flush with 2m of water before and after.  Give additional 2457mwater flush 4 times daily between feedings.   RD hand wrote tube feeding schedule and gave to patient as daughter had left the waiting area.  Discussed tube feeding regimen with patient.  Contact information provided    MONITORING, EVALUATION, GOAL: weight trends, tube feeding tolerance   NEXT VISIT: Tuesday, October 5th during infusion ([place])  [place]. AlZenia ResidesRDFox PointLDStilesegistered Dietitian [phone])

## 2020-03-30 NOTE — Patient Instructions (Signed)

## 2020-03-31 ENCOUNTER — Other Ambulatory Visit: Payer: Self-pay

## 2020-03-31 ENCOUNTER — Ambulatory Visit
Admission: RE | Admit: 2020-03-31 | Discharge: 2020-03-31 | Disposition: A | Discharge status: Home / Self Care | Payer: 59 | Source: Ambulatory Visit | Attending: Radiation Oncology | Admitting: Radiation Oncology

## 2020-03-31 DIAGNOSIS — R1313 Dysphagia, pharyngeal phase: Secondary | ICD-10-CM | POA: Diagnosis not present

## 2020-04-01 ENCOUNTER — Inpatient Hospital Stay: Payer: 59

## 2020-04-01 ENCOUNTER — Other Ambulatory Visit: Payer: Self-pay

## 2020-04-01 ENCOUNTER — Ambulatory Visit
Admission: RE | Admit: 2020-04-01 | Discharge: 2020-04-01 | Disposition: A | Discharge status: Home / Self Care | Payer: 59 | Source: Ambulatory Visit | Attending: Radiation Oncology | Admitting: Radiation Oncology

## 2020-04-01 ENCOUNTER — Encounter: Payer: Self-pay | Admitting: *Deleted

## 2020-04-01 VITALS — BP 121/65 | HR 78 | Temp 98.7°F | Resp 18

## 2020-04-01 DIAGNOSIS — R1313 Dysphagia, pharyngeal phase: Secondary | ICD-10-CM | POA: Diagnosis not present

## 2020-04-01 DIAGNOSIS — C099 Malignant neoplasm of tonsil, unspecified: Secondary | ICD-10-CM

## 2020-04-01 DIAGNOSIS — Z95828 Presence of other vascular implants and grafts: Secondary | ICD-10-CM | POA: Insufficient documentation

## 2020-04-01 LAB — CBC WITH DIFFERENTIAL (CANCER CENTER ONLY)
Abs Immature Granulocytes: 0.01 10*3/uL (ref 0.00–0.07)
Basophils Absolute: 0 10*3/uL (ref 0.0–0.1)
Basophils Relative: 0 %
Eosinophils Absolute: 0 10*3/uL (ref 0.0–0.5)
Eosinophils Relative: 0 %
HCT: 25.5 % — ABNORMAL LOW (ref 39.0–52.0)
Hemoglobin: 8.5 g/dL — ABNORMAL LOW (ref 13.0–17.0)
Immature Granulocytes: 0 %
Lymphocytes Relative: 7 %
Lymphs Abs: 0.3 10*3/uL — ABNORMAL LOW (ref 0.7–4.0)
MCH: 27.2 pg (ref 26.0–34.0)
MCHC: 33.3 g/dL (ref 30.0–36.0)
MCV: 81.5 fL (ref 80.0–100.0)
Monocytes Absolute: 0.3 10*3/uL (ref 0.1–1.0)
Monocytes Relative: 7 %
Neutro Abs: 3.3 10*3/uL (ref 1.7–7.7)
Neutrophils Relative %: 86 %
Platelet Count: 71 10*3/uL — ABNORMAL LOW (ref 150–400)
RBC: 3.13 MIL/uL — ABNORMAL LOW (ref 4.22–5.81)
RDW: 17.1 % — ABNORMAL HIGH (ref 11.5–15.5)
WBC Count: 3.9 10*3/uL — ABNORMAL LOW (ref 4.0–10.5)
nRBC: 0 % (ref 0.0–0.2)

## 2020-04-01 LAB — CMP (CANCER CENTER ONLY)
ALT: 22 U/L (ref 0–44)
AST: 18 U/L (ref 15–41)
Albumin: 3.1 g/dL — ABNORMAL LOW (ref 3.5–5.0)
Alkaline Phosphatase: 92 U/L (ref 38–126)
Anion gap: 9 (ref 5–15)
BUN: 31 mg/dL — ABNORMAL HIGH (ref 8–23)
CO2: 28 mmol/L (ref 22–32)
Calcium: 8.7 mg/dL — ABNORMAL LOW (ref 8.9–10.3)
Chloride: 95 mmol/L — ABNORMAL LOW (ref 98–111)
Creatinine: 1.45 mg/dL — ABNORMAL HIGH (ref 0.61–1.24)
GFR, Est AFR Am: 59 mL/min — ABNORMAL LOW (ref >60)
GFR, Estimated: 51 mL/min — ABNORMAL LOW (ref >60)
Glucose, Bld: 169 mg/dL — ABNORMAL HIGH (ref 70–99)
Potassium: 4.2 mmol/L (ref 3.5–5.1)
Sodium: 132 mmol/L — ABNORMAL LOW (ref 135–145)
Total Bilirubin: 0.4 mg/dL (ref 0.3–1.2)
Total Protein: 6.3 g/dL — ABNORMAL LOW (ref 6.5–8.1)

## 2020-04-01 LAB — MAGNESIUM: Magnesium: 0.9 mg/dL — CL (ref 1.7–2.4)

## 2020-04-01 MED ORDER — SODIUM CHLORIDE 0.9% FLUSH
10.0000 mL | Freq: Once | INTRAVENOUS | Status: AC
  Administered 2020-04-01: 10 mL
  Filled 2020-04-01: qty 10

## 2020-04-01 MED ORDER — MAGNESIUM SULFATE 2 GM/50ML IV SOLN
2.0000 g | Freq: Once | INTRAVENOUS | Status: AC
  Administered 2020-04-01: 2 g via INTRAVENOUS

## 2020-04-01 MED ORDER — MAGNESIUM SULFATE 2 GM/50ML IV SOLN
INTRAVENOUS | Status: AC
  Filled 2020-04-01: qty 50

## 2020-04-01 MED ORDER — SODIUM CHLORIDE 0.9 % IV SOLN
Freq: Once | INTRAVENOUS | Status: AC
  Filled 2020-04-01: qty 250

## 2020-04-01 MED ORDER — HEPARIN SOD (PORK) LOCK FLUSH 100 UNIT/ML IV SOLN
500.0000 [IU] | Freq: Once | INTRAVENOUS | Status: AC
  Administered 2020-04-01: 500 [IU]
  Filled 2020-04-01: qty 5

## 2020-04-01 NOTE — Progress Notes (Signed)
CRITICAL VALUE ALERT  Critical Value:  Mag 0.9  Date & Time Notied:  04/01/20 at [place]  Provider Notified: [name], MD  Orders Received/Actions taken: MD notified, orders received to administer 2 g IV magnesium to pt today.  Pharmacy notified to place orders.

## 2020-04-01 NOTE — Patient Instructions (Signed)

## 2020-04-02 ENCOUNTER — Ambulatory Visit
Admission: RE | Admit: 2020-04-02 | Discharge: 2020-04-02 | Disposition: A | Discharge status: Home / Self Care | Payer: 59 | Source: Ambulatory Visit | Attending: Radiation Oncology | Admitting: Radiation Oncology

## 2020-04-02 DIAGNOSIS — C770 Secondary and unspecified malignant neoplasm of lymph nodes of head, face and neck: Secondary | ICD-10-CM | POA: Diagnosis not present

## 2020-04-02 DIAGNOSIS — E860 Dehydration: Secondary | ICD-10-CM | POA: Diagnosis not present

## 2020-04-02 DIAGNOSIS — C098 Malignant neoplasm of overlapping sites of tonsil: Secondary | ICD-10-CM | POA: Diagnosis present

## 2020-04-02 DIAGNOSIS — R5383 Other fatigue: Secondary | ICD-10-CM | POA: Diagnosis not present

## 2020-04-02 DIAGNOSIS — Z931 Gastrostomy status: Secondary | ICD-10-CM | POA: Diagnosis not present

## 2020-04-02 DIAGNOSIS — Z510 Encounter for antineoplastic radiation therapy: Secondary | ICD-10-CM | POA: Diagnosis not present

## 2020-04-02 DIAGNOSIS — D696 Thrombocytopenia, unspecified: Secondary | ICD-10-CM | POA: Insufficient documentation

## 2020-04-02 DIAGNOSIS — N289 Disorder of kidney and ureter, unspecified: Secondary | ICD-10-CM | POA: Insufficient documentation

## 2020-04-02 DIAGNOSIS — R634 Abnormal weight loss: Secondary | ICD-10-CM | POA: Diagnosis not present

## 2020-04-02 DIAGNOSIS — C099 Malignant neoplasm of tonsil, unspecified: Secondary | ICD-10-CM | POA: Insufficient documentation

## 2020-04-02 DIAGNOSIS — Z79899 Other long term (current) drug therapy: Secondary | ICD-10-CM | POA: Insufficient documentation

## 2020-04-03 ENCOUNTER — Other Ambulatory Visit: Payer: Self-pay

## 2020-04-03 ENCOUNTER — Inpatient Hospital Stay: Payer: 59 | Attending: Hematology and Oncology

## 2020-04-03 VITALS — BP 126/57 | HR 50 | Temp 98.2°F | Resp 17

## 2020-04-03 DIAGNOSIS — Z95828 Presence of other vascular implants and grafts: Secondary | ICD-10-CM

## 2020-04-03 DIAGNOSIS — C099 Malignant neoplasm of tonsil, unspecified: Secondary | ICD-10-CM

## 2020-04-03 DIAGNOSIS — Z510 Encounter for antineoplastic radiation therapy: Secondary | ICD-10-CM | POA: Diagnosis not present

## 2020-04-03 MED ORDER — POTASSIUM CHLORIDE 10 MEQ/100ML IV SOLN
10.0000 meq | Freq: Once | INTRAVENOUS | Status: DC
Start: 2020-04-03 — End: 2020-04-03

## 2020-04-03 MED ORDER — HEPARIN SOD (PORK) LOCK FLUSH 100 UNIT/ML IV SOLN
500.0000 [IU] | Freq: Once | INTRAVENOUS | Status: AC
  Administered 2020-04-03: 500 [IU]
  Filled 2020-04-03: qty 5

## 2020-04-03 MED ORDER — MAGNESIUM SULFATE 2 GM/50ML IV SOLN: 2.0000 g | Freq: Once | INTRAVENOUS | Status: DC

## 2020-04-03 MED ORDER — SODIUM CHLORIDE 0.9% FLUSH
10.0000 mL | Freq: Once | INTRAVENOUS | Status: AC
  Administered 2020-04-03: 10 mL
  Filled 2020-04-03: qty 10

## 2020-04-03 MED ORDER — SODIUM CHLORIDE 0.9 % IV SOLN
Freq: Once | INTRAVENOUS | Status: AC
  Filled 2020-04-03: qty 250

## 2020-04-03 NOTE — Patient Instructions (Signed)

## 2020-04-05 ENCOUNTER — Ambulatory Visit
Admission: RE | Admit: 2020-04-05 | Discharge: 2020-04-05 | Disposition: A | Discharge status: Home / Self Care | Payer: 59 | Source: Ambulatory Visit | Attending: Radiation Oncology | Admitting: Radiation Oncology

## 2020-04-05 ENCOUNTER — Telehealth: Payer: Self-pay | Admitting: Nutrition

## 2020-04-05 ENCOUNTER — Other Ambulatory Visit: Payer: Self-pay

## 2020-04-05 ENCOUNTER — Encounter: Payer: Self-pay | Admitting: Hematology and Oncology

## 2020-04-05 DIAGNOSIS — Z510 Encounter for antineoplastic radiation therapy: Secondary | ICD-10-CM | POA: Diagnosis not present

## 2020-04-05 DIAGNOSIS — C099 Malignant neoplasm of tonsil, unspecified: Secondary | ICD-10-CM

## 2020-04-05 NOTE — Telephone Encounter (Signed)
[name] contacted me wondering how to reorder tube feeding.  Tube feeding orders need to be placed through patient's home health company.  His tube feeding orders were placed throughout [facility] so I provided telephone number for patient's daughter to call in refill request.

## 2020-04-06 ENCOUNTER — Inpatient Hospital Stay: Payer: 59

## 2020-04-06 ENCOUNTER — Inpatient Hospital Stay: Payer: 59 | Admitting: Nutrition

## 2020-04-06 ENCOUNTER — Ambulatory Visit
Admission: RE | Admit: 2020-04-06 | Discharge: 2020-04-06 | Disposition: A | Discharge status: Home / Self Care | Payer: 59 | Source: Ambulatory Visit | Attending: Radiation Oncology | Admitting: Radiation Oncology

## 2020-04-06 ENCOUNTER — Encounter: Payer: Self-pay | Admitting: Hematology and Oncology

## 2020-04-06 ENCOUNTER — Telehealth: Payer: Self-pay | Admitting: Nutrition

## 2020-04-06 ENCOUNTER — Other Ambulatory Visit: Payer: Self-pay

## 2020-04-06 VITALS — BP 122/66 | HR 99 | Temp 100.1°F | Resp 16 | Wt 203.0 lb

## 2020-04-06 DIAGNOSIS — C099 Malignant neoplasm of tonsil, unspecified: Secondary | ICD-10-CM

## 2020-04-06 DIAGNOSIS — Z95828 Presence of other vascular implants and grafts: Secondary | ICD-10-CM

## 2020-04-06 DIAGNOSIS — Z510 Encounter for antineoplastic radiation therapy: Secondary | ICD-10-CM | POA: Diagnosis not present

## 2020-04-06 MED ORDER — MAGNESIUM SULFATE 2 GM/50ML IV SOLN
INTRAVENOUS | Status: AC
  Filled 2020-04-06: qty 50

## 2020-04-06 MED ORDER — SODIUM CHLORIDE 0.9 % IV SOLN
INTRAVENOUS | Status: DC
  Filled 2020-04-06: qty 250

## 2020-04-06 MED ORDER — SODIUM CHLORIDE 0.9% FLUSH
10.0000 mL | Freq: Once | INTRAVENOUS | Status: AC
  Administered 2020-04-06: 10 mL
  Filled 2020-04-06: qty 10

## 2020-04-06 MED ORDER — POTASSIUM CHLORIDE 10 MEQ/100ML IV SOLN
INTRAVENOUS | Status: AC
  Filled 2020-04-06: qty 100

## 2020-04-06 MED ORDER — SODIUM CHLORIDE 0.9 % IV SOLN
Freq: Once | INTRAVENOUS | Status: AC
  Filled 2020-04-06: qty 250

## 2020-04-06 MED ORDER — HEPARIN SOD (PORK) LOCK FLUSH 100 UNIT/ML IV SOLN
500.0000 [IU] | Freq: Once | INTRAVENOUS | Status: AC
  Administered 2020-04-06: 500 [IU]
  Filled 2020-04-06: qty 5

## 2020-04-06 MED ORDER — MAGNESIUM SULFATE 2 GM/50ML IV SOLN
2.0000 g | Freq: Once | INTRAVENOUS | Status: AC
  Administered 2020-04-06: 2 g via INTRAVENOUS

## 2020-04-06 MED ORDER — POTASSIUM CHLORIDE 10 MEQ/100ML IV SOLN
10.0000 meq | Freq: Once | INTRAVENOUS | Status: AC
  Administered 2020-04-06: 10 meq via INTRAVENOUS

## 2020-04-06 NOTE — Telephone Encounter (Signed)
Nutrition follow-up completed with patient during infusion for tonsil cancer.   Patient is receiving IV fluids. He reports his tube feeding is going well and he denies problems with tolerance. He denies nausea, vomiting, constipation, or diarrhea. He is not eating by mouth but reports he does drink some water when he takes his medications orally. Weight was improved at 203 pounds today up from 199 pounds on September 21. I have contacted his daughter [name] at his request to confirm tube feeding regimen and free water intake.  I had to leave a message for return call.  Estimated nutrition needs: 2400-2700 cal, 115-130 g protein, greater than 2.4 L fluid.  6 cartons [employer] 1.4 and free water flushes provides 2730 cal, 120 g protein, 2844 mL of free water.  This is 100% estimated nutrition needs.  Nutrition diagnosis: Unintentional weight loss has improved.  Intervention: [employer] 1.4, 1-1/2 cartons, 4 times daily with 60 mL free water before and after bolus feedings. 240 mL free water flushes 4 times a day between tube feeding. Continue to try to increase oral intake.  Monitoring, evaluation, goals: Patient will tolerate tube feeding with adequate calories and protein to minimize weight loss during treatment.  Next visit: Tuesday, October 12 during IV fluids with [place].  **Disclaimer: This note was dictated with voice recognition software. Similar sounding words can inadvertently be transcribed and this note may contain transcription errors which may not have been corrected upon publication of note.**

## 2020-04-06 NOTE — Patient Instructions (Signed)

## 2020-04-06 NOTE — Progress Notes (Signed)
See telephone note.

## 2020-04-07 ENCOUNTER — Ambulatory Visit
Admission: RE | Admit: 2020-04-07 | Discharge: 2020-04-07 | Disposition: A | Discharge status: Home / Self Care | Payer: 59 | Source: Ambulatory Visit | Attending: Radiation Oncology | Admitting: Radiation Oncology

## 2020-04-07 ENCOUNTER — Ambulatory Visit: Payer: 59

## 2020-04-07 DIAGNOSIS — Z510 Encounter for antineoplastic radiation therapy: Secondary | ICD-10-CM | POA: Diagnosis not present

## 2020-04-07 NOTE — Progress Notes (Signed)
Patient Care Team: Chrismon, Vickki Muff, PA as PCP - General (Family Medicine) [name], MD as Consulting Physician (Cardiothoracic Surgery) [name], MD as Consulting Physician (Cardiology) Serena Colonel, RN as [place] [employer] [name], MD as Consulting Physician (Hematology and Oncology) [name], MD as Attending Physician (Radiation Oncology) [name], [name], RN as Oncology Nurse Navigator [name], [name], Adrian as Speech Language Pathologist (Speech Pathology) [name], DDS as Consulting Physician (Dentistry) [name], [name], PT as Physical Therapist (Physical Therapy) [name], LCSW as Social Worker (General Practice) [name], LCSW as Social Worker [name], RD as Dietitian (Nutrition)  DIAGNOSIS:    ICD-10-CM   1. Tonsillar cancer ([place])  C09.9     SUMMARY OF ONCOLOGIC HISTORY: Oncology History  Tonsillar cancer ([place])  01/14/2020 Initial Diagnosis   Squamous cell carcinoma with basaloid features   01/26/2020 PET scan   Right tonsillar mass extending to bilateral tongue base 4.9 cm.  Two right cervical lymph nodes metastases measuring 2.5 cm and 2.1 cm   02/17/2020 -  Chemotherapy   The patient had dexamethasone (DECADRON) 4 MG tablet, 8 mg, Oral, Daily, 1 of 1 cycle, Start date: 02/02/2020, End date: -- palonosetron (ALOXI) injection 0.25 mg, 0.25 mg, Intravenous,  Once, 3 of 3 cycles Administration: 0.25 mg (02/17/2020), 0.25 mg (03/02/2020), 0.25 mg (03/23/2020) CISplatin (PLATINOL) 173 mg in sodium chloride 0.9 % 500 mL chemo infusion, 75 mg/m2 = 173 mg (100 % of original dose 75 mg/m2), Intravenous,  Once, 3 of 3 cycles Dose modification: 75 mg/m2 (original dose 75 mg/m2, Cycle 1, Reason: Provider Judgment), 60 mg/m2 (original dose 75 mg/m2, Cycle 2, Reason: Dose not tolerated) Administration: 173 mg (02/17/2020), 138 mg (03/02/2020), 138 mg (03/23/2020) fosaprepitant  (EMEND) 150 mg in sodium chloride 0.9 % 145 mL IVPB, 150 mg, Intravenous,  Once, 3 of 3 cycles Administration: 150 mg (02/17/2020), 150 mg (03/02/2020), 150 mg (03/23/2020)  for chemotherapy treatment.    02/17/2020 Cancer Staging   Staging form: Pharynx - P16 Negative Oropharynx, AJCC 8th Edition - Clinical stage from 02/17/2020: Stage IVA (cT4a, cN2b, cM0, p16+) - Signed by [name], MD on 03/02/2020   Cancer of overlapping sites of tonsil [facility])  01/27/2020 Cancer Staging   Staging form: Pharynx - HPV-Mediated Oropharynx, AJCC 8th Edition - Clinical stage from 01/27/2020: Stage III (cT4, cN1, cM0, p16+) - Signed by [name], MD on 01/31/2020   01/31/2020 Initial Diagnosis   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[place])     CHIEF COMPLIANT: Cycle 3Day17Cisplatin  INTERVAL HISTORY: Omar Christian is a 64 y.o. with above-mentioned history of tonsil cancercurrently on treatment with radiation and chemotherapy with cisplatin. He presents to the clinic today for follow-up.  He reports that his pain in the mouth is at 5 out of 10.  He is not able to eat or drink anything.  He is getting complete nutrition through his PEG tube only.  He is also getting IV fluids 3 times a week.  He appears to be improving and his energy levels.  He said he has gained about 3 pounds.  Denies any nausea or vomiting.  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  ALLERGIES:  has No Known Allergies.  MEDICATIONS:  Current Outpatient Medications  Medication Sig Dispense Refill   allopurinol (ZYLOPRIM) 100 MG tablet Take 1 tablet (100 mg total) by mouth daily. 30 tablet 12   atorvastatin (LIPITOR) 20  MG tablet Take 1 tablet (20 mg total) by mouth daily. 90 tablet 3   dexamethasone (DECADRON) 4 MG tablet Take 2 tablets (8 mg total) by mouth daily. Take daily for 3 days after chemo. Take with food. 20 tablet 0   guaiFENesin-dextromethorphan (ROBITUSSIN DM) 100-10 MG/5ML syrup Take 5 mLs by mouth  every 4 (four) hours as needed for cough. 118 mL 0   lidocaine (XYLOCAINE) 2 % solution Patient: Mix 1part 2% viscous lidocaine, 1part H20. Swish & swallow 67mL of diluted mixture, 40min before eating and at bedtime, up to 5xs daily. 200 mL 4   lidocaine-prilocaine (EMLA) cream Apply to affected area once 30 g 3   LORazepam (ATIVAN) 0.5 MG tablet Take 1 tablet (0.5 mg total) by mouth at bedtime as needed (Nausea or vomiting). 30 tablet 0   magic mouthwash w/lidocaine SOLN Take 5 mLs by mouth 5 (five) times daily as needed for mouth pain. 240 mL 1   metoCLOPramide (REGLAN) 10 MG/10ML SOLN Take 5 mLs (5 mg total) by mouth 3 (three) times daily before meals. 1200 mL 0   metoprolol succinate (TOPROL XL) 25 MG 24 hr tablet Take 1 tablet (25 mg total) by mouth daily. 30 tablet 3   MITIGARE 0.6 MG CAPS Take 1 capsule by mouth daily.     Nutritional Supplements (K[employer] [place]AR[place] STANDARD 1.4) LIQD 488 mLs by Gastrostomy Tube route 4 (four) times daily. 2730 mL 6   nystatin (MYCOSTATIN) 100000 UNIT/ML suspension TAKE 5 MLS (500,000 UNITS TOTAL) BY MOUTH IN THE MORNING, AT NOON, AND AT BEDTIME. 60 mL 0   ondansetron (ZOFRAN) 8 MG tablet Take 1 tablet (8 mg total) by mouth 2 (two) times daily as needed. Start on the third day after chemotherapy. 30 tablet 1   oxyCODONE-acetaminophen (PERCOCET/ROXICET) 5-325 MG tablet Take 1 tablet by mouth every 4 (four) hours as needed.      prochlorperazine (COMPAZINE) 10 MG tablet Take 1 tablet (10 mg total) by mouth every 6 (six) hours as needed (Nausea or vomiting). 30 tablet 1   sodium fluoride (PREVIDENT 5000 PLUS) 1.1 % CREA dental cream Apply to tooth brush. Brush teeth for 2 minutes. Spit out excess-DO NOT swallow. DO NOT rinse afterwards. Repeat nightly. 51 g prn   No current facility-administered medications for this visit.    PHYSICAL EXAMINATION: ECOG PERFORMANCE STATUS: 1 - Symptomatic but completely ambulatory  There were no vitals filed for this  visit. There were no vitals filed for this visit.  LABORATORY DATA:  I have reviewed the data as listed CMP Latest Ref Rng & Units 04/01/2020 03/22/2020 03/09/2020  Glucose 70 - 99 mg/dL 169(H) 129(H) 154(H)  BUN 8 - 23 mg/dL 31(H) 20 29(H)  Creatinine 0.61 - 1.24 mg/dL 1.45(H) 1.62(H) 1.38(H)  Sodium 135 - 145 mmol/L 132(L) 136 135  Potassium 3.5 - 5.1 mmol/L 4.2 3.6 3.5  Chloride 98 - 111 mmol/L 95(L) 98 97(L)  CO2 22 - 32 mmol/L 28 27 28   Calcium 8.9 - 10.3 mg/dL 8.7(L) 9.5 8.9  Total Protein 6.5 - 8.1 g/dL 6.3(L) 6.8 6.4(L)  Total Bilirubin 0.3 - 1.2 mg/dL 0.4 0.9 0.4  Alkaline Phos 38 - 126 U/L 92 105 89  AST 15 - 41 U/L 18 16 17   ALT 0 - 44 U/L 22 22 29     Lab Results  Component Value Date   WBC 3.9 (L) 04/01/2020   HGB 8.5 (L) 04/01/2020   HCT 25.5 (L) 04/01/2020   MCV 81.5 04/01/2020  PLT 71 (L) 04/01/2020   NEUTROABS 3.3 04/01/2020    ASSESSMENT & PLAN:  Tonsillar cancer ([place]) 01/14/2020: 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 Two right cervical lymph nodesmetastases measuring 2.5 cm and 2.1 cm T4N2BM0 stage IVa HPV status:Positive  Treatment plan:Concurrent chemoradiation with cisplatin every 3 weeks x3.  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 2.Fatigue 3.Worsening hearing impairment: Cisplatin related worsening of an already poor hearing 4.Nausea 5.Dehydration: He is getting IV fluids  3 times a week. Following with nutritionist/dietitian  6.Renal insufficiency: Due to dehydration 7.Thrombocytopenia    RTC 3 times a week for IV fluids I will see him back in 2 weeks. Labs will be done weekly.  No orders of the defined types were placed in this encounter.  The patient  has a good understanding of the overall plan. he agrees with it. he will call with any problems that may develop before the next visit here.  Total time spent: 30 mins including face to face time and time spent for planning, charting and coordination of care  [name], MD 04/08/2020  I, Cloyde Reams Dorshimer, am acting as scribe for Dr. [name].  I have reviewed the above documentation for accuracy and completeness, and I agree with the above.

## 2020-04-08 ENCOUNTER — Inpatient Hospital Stay (HOSPITAL_BASED_OUTPATIENT_CLINIC_OR_DEPARTMENT_OTHER): Payer: 59 | Admitting: Hematology and Oncology

## 2020-04-08 ENCOUNTER — Other Ambulatory Visit: Payer: Self-pay

## 2020-04-08 ENCOUNTER — Ambulatory Visit: Payer: 59

## 2020-04-08 ENCOUNTER — Ambulatory Visit
Admission: RE | Admit: 2020-04-08 | Discharge: 2020-04-08 | Disposition: A | Discharge status: Home / Self Care | Payer: 59 | Source: Ambulatory Visit | Attending: Radiation Oncology | Admitting: Radiation Oncology

## 2020-04-08 DIAGNOSIS — C099 Malignant neoplasm of tonsil, unspecified: Secondary | ICD-10-CM | POA: Diagnosis not present

## 2020-04-08 DIAGNOSIS — Z510 Encounter for antineoplastic radiation therapy: Secondary | ICD-10-CM | POA: Diagnosis not present

## 2020-04-08 NOTE — Assessment & Plan Note (Signed)
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 Two right cervical lymph nodesmetastases measuring 2.5 cm and 2.1 cm T4N2BM0 stage IVa HPV status:Positive  Treatment plan:Concurrent chemoradiation with cisplatin every 3 weeks x3.  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 2.Fatigue 3.Worsening hearing impairment: Cisplatin related worsening of an already poor hearing 4.Nausea 5.Dehydration: He is getting IV fluids  3 times a week.   Following with nutritionist/dietitian  6.Renal insufficiency: Due to dehydration 7.Thrombocytopenia     RTC 3 times a week for IV fluids I will see him back in 2 weeks. Labs will be done weekly.

## 2020-04-09 ENCOUNTER — Ambulatory Visit: Payer: 59

## 2020-04-09 ENCOUNTER — Telehealth: Payer: Self-pay | Admitting: *Deleted

## 2020-04-09 ENCOUNTER — Ambulatory Visit
Admission: RE | Admit: 2020-04-09 | Discharge: 2020-04-09 | Disposition: A | Discharge status: Home / Self Care | Payer: 59 | Source: Ambulatory Visit | Attending: Radiation Oncology | Admitting: Radiation Oncology

## 2020-04-09 ENCOUNTER — Telehealth: Payer: Self-pay | Admitting: Nutrition

## 2020-04-09 DIAGNOSIS — Z510 Encounter for antineoplastic radiation therapy: Secondary | ICD-10-CM | POA: Diagnosis not present

## 2020-04-09 NOTE — Telephone Encounter (Signed)
Omar Christian with Dr Geralyn Flash office called asking if a patient of [facility] could possibly get future IV fluids with Mg+ and K+ at our facility per patient request as he lives in [place] and does not want to drive to [facility] for this if he can get it closer to home. He does not want to transfer care. Please advise

## 2020-04-09 NOTE — Telephone Encounter (Signed)
I personally spoke with Ranelle Oyster, RN. She is already in collaboration with [facility] for this patient's care.

## 2020-04-09 NOTE — Telephone Encounter (Signed)
Patient reports he has not received his tube feeding delivery.  Reports he was going to run out this weekend.  Provided patient with 2 cases of [employer] 1.4 samples I had available.  I also ordered samples to be delivered from [facility] but they may take a week to get there.  I have contacted [name] to communicate this information.  Also let her know that tube feeding delivery is waiting on insurance preauthorization before they can provide product.  [name] is aware appreciates samples.

## 2020-04-09 NOTE — Telephone Encounter (Signed)
Per Ranelle Oyster and Sofie Rower, RN this patient's care has been thoroughly been taken care of.

## 2020-04-10 ENCOUNTER — Inpatient Hospital Stay: Payer: 59

## 2020-04-10 VITALS — BP 106/77 | HR 78 | Temp 100.1°F | Resp 16

## 2020-04-10 DIAGNOSIS — Z510 Encounter for antineoplastic radiation therapy: Secondary | ICD-10-CM | POA: Diagnosis not present

## 2020-04-10 DIAGNOSIS — C099 Malignant neoplasm of tonsil, unspecified: Secondary | ICD-10-CM

## 2020-04-10 DIAGNOSIS — Z95828 Presence of other vascular implants and grafts: Secondary | ICD-10-CM

## 2020-04-10 MED ORDER — SODIUM CHLORIDE 0.9 % IV SOLN
Freq: Once | INTRAVENOUS | Status: AC
  Filled 2020-04-10: qty 250

## 2020-04-10 MED ORDER — POTASSIUM CHLORIDE 10 MEQ/100ML IV SOLN
10.0000 meq | Freq: Once | INTRAVENOUS | Status: AC
  Administered 2020-04-10: 10 meq via INTRAVENOUS

## 2020-04-10 MED ORDER — MAGNESIUM SULFATE 2 GM/50ML IV SOLN
2.0000 g | Freq: Once | INTRAVENOUS | Status: AC
  Administered 2020-04-10: 2 g via INTRAVENOUS

## 2020-04-10 MED ORDER — DIPHENHYDRAMINE HCL 25 MG PO CAPS
ORAL_CAPSULE | ORAL | Status: AC
  Filled 2020-04-10: qty 1

## 2020-04-10 MED ORDER — POTASSIUM CHLORIDE 10 MEQ/100ML IV SOLN
INTRAVENOUS | Status: AC
  Filled 2020-04-10: qty 100

## 2020-04-10 MED ORDER — HEPARIN SOD (PORK) LOCK FLUSH 100 UNIT/ML IV SOLN
500.0000 [IU] | Freq: Once | INTRAVENOUS | Status: AC
  Administered 2020-04-10: 500 [IU]
  Filled 2020-04-10: qty 5

## 2020-04-10 MED ORDER — MAGNESIUM SULFATE 2 GM/50ML IV SOLN
INTRAVENOUS | Status: AC
  Filled 2020-04-10: qty 50

## 2020-04-10 MED ORDER — SODIUM CHLORIDE 0.9% FLUSH
10.0000 mL | Freq: Once | INTRAVENOUS | Status: AC
  Administered 2020-04-10: 10 mL
  Filled 2020-04-10: qty 10

## 2020-04-10 MED ORDER — ACETAMINOPHEN 325 MG PO TABS
ORAL_TABLET | ORAL | Status: AC
  Filled 2020-04-10: qty 2

## 2020-04-10 NOTE — Patient Instructions (Signed)
Hypomagnesemia Hypomagnesemia is a condition in which the level of magnesium in the blood is low. Magnesium is a mineral that is found in many foods. It is used in many different processes in the body. Hypomagnesemia can affect every organ in the body. In severe cases, it can cause life-threatening problems. What are the causes? This condition may be caused by:  Not getting enough magnesium in your diet.  Malnutrition.  Problems with absorbing magnesium from the intestines.  Dehydration.  Alcohol abuse.  Vomiting.  Severe or chronic diarrhea.  Some medicines, including medicines that make you urinate more (diuretics).  Certain diseases, such as kidney disease, diabetes, celiac disease, and overactive thyroid. What are the signs or symptoms? Symptoms of this condition include:  Loss of appetite.  Nausea and vomiting.  Involuntary shaking or trembling of a body part (tremor).  Muscle weakness.  Tingling in the arms and legs.  Sudden tightening of muscles (muscle spasms).  Confusion.  Psychiatric issues, such as depression, irritability, or psychosis.  A feeling of fluttering of the heart.  Seizures. These symptoms are more severe if magnesium levels drop suddenly. How is this diagnosed? This condition may be diagnosed based on:  Your symptoms and medical history.  A physical exam.  Blood and urine tests. How is this treated? Treatment depends on the cause and the severity of the condition. It may be treated with:  A magnesium supplement. This can be taken in pill form. If the condition is severe, magnesium is usually given through an IV.  Changes to your diet. You may be directed to eat foods that have a lot of magnesium, such as green leafy vegetables, peas, beans, and nuts.  Stopping any intake of alcohol. Follow these instructions at home:      Make sure that your diet includes foods with magnesium. Foods that have a lot of magnesium in them  include: ? Green leafy vegetables, such as spinach and broccoli. ? Beans and peas. ? Nuts and seeds, such as almonds and sunflower seeds. ? Whole grains, such as whole grain bread and fortified cereals.  Take magnesium supplements if your health care provider tells you to do that. Take them as directed.  Take over-the-counter and prescription medicines only as told by your health care provider.  Have your magnesium levels monitored as told by your health care provider.  When you are active, drink fluids that contain electrolytes.  Avoid drinking alcohol.  Keep all follow-up visits as told by your health care provider. This is important. Contact a health care provider if:  You get worse instead of better.  Your symptoms return. Get help right away if you:  Develop severe muscle weakness.  Have trouble breathing.  Feel that your heart is racing. Summary  Hypomagnesemia is a condition in which the level of magnesium in the blood is low.  Hypomagnesemia can affect every organ in the body.  Treatment may include eating more foods that contain magnesium, taking magnesium supplements, and not drinking alcohol.  Have your magnesium levels monitored as told by your health care provider. This information is not intended to replace advice given to you by your health care provider. Make sure you discuss any questions you have with your health care provider. Document Revised: 06/01/2017 Document Reviewed: 05/21/2017 Elsevier Patient Education  [place].     Hypokalemia ypokalemia means that the amount of potassium in the blood is lower than normal. Potassium is a chemical (electrolyte) that helps regulate the amount of  fluid in the body. It also stimulates muscle tightening (contraction) and helps nerves work properly. Normally, most of the body's potassium is inside cells, and only a very small amount is in the blood. Because the amount in the blood is so small, minor  changes to potassium levels in the blood can be life-threatening. What are the causes? This condition may be caused by: Antibiotic medicine. Diarrhea or vomiting. Taking too much of a medicine that helps you have a bowel movement (laxative) can cause diarrhea and lead to hypokalemia. Chronic kidney disease (CKD). Medicines that help the body get rid of excess fluid (diuretics). Eating disorders, such as bulimia. Low magnesium levels in the body. Sweating a lot. What are the signs or symptoms? Symptoms of this condition include: Weakness. Constipation. Fatigue. Muscle cramps. Mental confusion. Skipped heartbeats or irregular heartbeat (palpitations). Tingling or numbness. How is this diagnosed? This condition is diagnosed with a blood test. How is this treated? This condition may be treated by: Taking potassium supplements by mouth. Adjusting the medicines that you take. Eating more foods that contain a lot of potassium. If your potassium level is very low, you may need to get potassium through an IV and be monitored in the hospital. Follow these instructions at home:  Take over-the-counter and prescription medicines only as told by your health care provider. This includes vitamins and supplements. Eat a healthy diet. A healthy diet includes fresh fruits and vegetables, whole grains, healthy fats, and lean proteins. If instructed, eat more foods that contain a lot of potassium. This includes: Nuts, such as peanuts and pistachios. Seeds, such as sunflower seeds and pumpkin seeds. Peas, lentils, and lima beans. Whole grain and bran cereals and breads. Fresh fruits and vegetables, such as apricots, avocado, bananas, cantaloupe, kiwi, oranges, tomatoes, asparagus, and potatoes. Orange juice. Tomato juice. Red meats. Yogurt. Keep all follow-up visits as told by your health care provider. This is important. Contact a health care provider if you: Have weakness that gets  worse. Feel your heart pounding or racing. Vomit. Have diarrhea. Have diabetes (diabetes mellitus) and you have trouble keeping your blood sugar (glucose) in your target range. Get help right away if you: Have chest pain. Have shortness of breath. Have vomiting or diarrhea that lasts for more than 2 days. Faint. Summary Hypokalemia means that the amount of potassium in the blood is lower than normal. This condition is diagnosed with a blood test. Hypokalemia may be treated by taking potassium supplements, adjusting the medicines that you take, or eating more foods that are high in potassium. If your potassium level is very low, you may need to get potassium through an IV and be monitored in the hospital. This information is not intended to replace advice given to you by your health care provider. Make sure you discuss any questions you have with your health care provider. Document Revised: 01/30/2018 Document Reviewed: 01/30/2018 Elsevier Patient Education  [place]. Dehydration, Adult Dehydration is condition in which there is not enough water or other fluids in the body. This happens when a person loses more fluids than he or she takes in. Important body parts cannot work right without the right amount of fluids. Any loss of fluids from the body can cause dehydration. Dehydration can be mild, worse, or very bad. It should be treated right away to keep it from getting very bad. What are the causes? This condition may be caused by:  Conditions that cause loss of water or other fluids, such  as: ? Watery poop (diarrhea). ? Vomiting. ? Sweating a lot. ? Peeing (urinating) a lot.  Not drinking enough fluids, especially when you: ? Are ill. ? Are doing things that take a lot of energy to do.  Other illnesses and conditions, such as fever or infection.  Certain medicines, such as medicines that take extra fluid out of the body (diuretics).  Lack of safe drinking  water.  Not being able to get enough water and food. What increases the risk? The following factors may make you more likely to develop this condition:  Having a long-term (chronic) illness that has not been treated the right way, such as: ? Diabetes. ? Heart disease. ? Kidney disease.  Being 63 years of age or older.  Having a disability.  Living in a place that is high above the ground or sea (high in altitude). The thinner, dried air causes more fluid loss.  Doing exercises that put stress on your body for a long time. What are the signs or symptoms? Symptoms of dehydration depend on how bad it is. Mild or worse dehydration  Thirst.  Dry lips or dry mouth.  Feeling dizzy or light-headed, especially when you stand up from sitting.  Muscle cramps.  Your body making: ? Dark pee (urine). Pee may be the color of tea. ? Less pee than normal. ? Less tears than normal.  Headache. Very bad dehydration  Changes in skin. Skin may: ? Be cold to the touch (clammy). ? Be blotchy or pale. ? Not go back to normal right after you lightly pinch it and let it go.  Little or no tears, pee, or sweat.  Changes in vital signs, such as: ? Fast breathing. ? Low blood pressure. ? Weak pulse. ? Pulse that is more than 100 beats a minute when you are sitting still.  Other changes, such as: ? Feeling very thirsty. ? Eyes that look hollow (sunken). ? Cold hands and feet. ? Being mixed up (confused). ? Being very tired (lethargic) or having trouble waking from sleep. ? Short-term weight loss. ? Loss of consciousness. How is this treated? Treatment for this condition depends on how bad it is. Treatment should start right away. Do not wait until your condition gets very bad. Very bad dehydration is an emergency. You will need to go to a hospital.  Mild or worse dehydration can be treated at home. You may be asked to: ? Drink more fluids. ? Drink an oral rehydration solution (ORS).  This drink helps get the right amounts of fluids and salts and minerals in the blood (electrolytes).  Very bad dehydration can be treated: ? With fluids through an IV tube. ? By getting normal levels of salts and minerals in your blood. This is often done by giving salts and minerals through a tube. The tube is passed through your nose and into your stomach. ? By treating the root cause. Follow these instructions at home: Oral rehydration solution If told by your doctor, drink an ORS:  Make an ORS. Use instructions on the package.  Start by drinking small amounts, about  cup (120 mL) every 5-10 minutes.  Slowly drink more until you have had the amount that your doctor said to have. Eating and drinking         Drink enough clear fluid to keep your pee pale yellow. If you were told to drink an ORS, finish the ORS first. Then, start slowly drinking other clear fluids. Drink fluids such as: ?  Water. Do not drink only water. Doing that can make the salt (sodium) level in your body get too low. ? Water from ice chips you suck on. ? Fruit juice that you have added water to (diluted). ? Low-calorie sports drinks.  Eat foods that have the right amounts of salts and minerals, such as: ? Bananas. ? Oranges. ? Potatoes. ? Tomatoes. ? Spinach.  Do not drink alcohol.  Avoid: ? Drinks that have a lot of sugar. These include:  High-calorie sports drinks.  Fruit juice that you did not add water to.  Soda.  Caffeine. ? Foods that are greasy or have a lot of fat or sugar. General instructions  Take over-the-counter and prescription medicines only as told by your doctor.  Do not take salt tablets. Doing that can make the salt level in your body get too high.  Return to your normal activities as told by your doctor. Ask your doctor what activities are safe for you.  Keep all follow-up visits as told by your doctor. This is important. Contact a doctor if:  You have pain in your  belly (abdomen) and the pain: ? Gets worse. ? Stays in one place.  You have a rash.  You have a stiff neck.  You get angry or annoyed (irritable) more easily than normal.  You are more tired or have a harder time waking than normal.  You feel: ? Weak or dizzy. ? Very thirsty. Get help right away if you have:  Any symptoms of very bad dehydration.  Symptoms of vomiting, such as: ? You cannot eat or drink without vomiting. ? Your vomiting gets worse or does not go away. ? Your vomit has blood or green stuff in it.  Symptoms that get worse with treatment.  A fever.  A very bad headache.  Problems with peeing or pooping (having a bowel movement), such as: ? Watery poop that gets worse or does not go away. ? Blood in your poop (stool). This may cause poop to look black and tarry. ? Not peeing in 6-8 hours. ? Peeing only a small amount of very dark pee in 6-8 hours.  Trouble breathing. These symptoms may be an emergency. Do not wait to see if the symptoms will go away. Get medical help right away. Call your local emergency services (911 in the [place] Do not drive yourself to the hospital. Summary  Dehydration is a condition in which there is not enough water or other fluids in the body. This happens when a person loses more fluids than he or she takes in.  Treatment for this condition depends on how bad it is. Treatment should be started right away. Do not wait until your condition gets very bad.  Drink enough clear fluid to keep your pee pale yellow. If you were told to drink an oral rehydration solution (ORS), finish the ORS first. Then, start slowly drinking other clear fluids.  Take over-the-counter and prescription medicines only as told by your doctor.  Get help right away if you have any symptoms of very bad dehydration. This information is not intended to replace advice given to you by your health care provider. Make sure you discuss any questions you have with your  health care provider. Document Revised: 01/30/2019 Document Reviewed: 01/30/2019 Elsevier Patient Education  [place].

## 2020-04-12 ENCOUNTER — Other Ambulatory Visit: Payer: Self-pay | Admitting: *Deleted

## 2020-04-12 ENCOUNTER — Inpatient Hospital Stay: Payer: 59

## 2020-04-12 ENCOUNTER — Other Ambulatory Visit: Payer: Self-pay | Admitting: Hematology and Oncology

## 2020-04-12 ENCOUNTER — Telehealth: Payer: Self-pay | Admitting: *Deleted

## 2020-04-12 ENCOUNTER — Encounter: Payer: Self-pay | Admitting: Radiation Oncology

## 2020-04-12 ENCOUNTER — Other Ambulatory Visit: Payer: Self-pay

## 2020-04-12 ENCOUNTER — Ambulatory Visit
Admission: RE | Admit: 2020-04-12 | Discharge: 2020-04-12 | Disposition: A | Discharge status: Home / Self Care | Payer: 59 | Source: Ambulatory Visit | Attending: Radiation Oncology | Admitting: Radiation Oncology

## 2020-04-12 ENCOUNTER — Inpatient Hospital Stay: Payer: 59 | Attending: Radiation Oncology

## 2020-04-12 ENCOUNTER — Telehealth: Payer: Self-pay | Admitting: Oncology

## 2020-04-12 ENCOUNTER — Encounter: Payer: Self-pay | Admitting: Hematology and Oncology

## 2020-04-12 VITALS — BP 132/57 | HR 45 | Temp 98.6°F | Resp 16

## 2020-04-12 DIAGNOSIS — E785 Hyperlipidemia, unspecified: Secondary | ICD-10-CM | POA: Diagnosis not present

## 2020-04-12 DIAGNOSIS — Z79899 Other long term (current) drug therapy: Secondary | ICD-10-CM | POA: Diagnosis not present

## 2020-04-12 DIAGNOSIS — H9190 Unspecified hearing loss, unspecified ear: Secondary | ICD-10-CM | POA: Diagnosis not present

## 2020-04-12 DIAGNOSIS — I10 Essential (primary) hypertension: Secondary | ICD-10-CM | POA: Diagnosis not present

## 2020-04-12 DIAGNOSIS — C098 Malignant neoplasm of overlapping sites of tonsil: Secondary | ICD-10-CM | POA: Diagnosis present

## 2020-04-12 DIAGNOSIS — K219 Gastro-esophageal reflux disease without esophagitis: Secondary | ICD-10-CM | POA: Insufficient documentation

## 2020-04-12 DIAGNOSIS — Z951 Presence of aortocoronary bypass graft: Secondary | ICD-10-CM | POA: Insufficient documentation

## 2020-04-12 DIAGNOSIS — I2510 Atherosclerotic heart disease of native coronary artery without angina pectoris: Secondary | ICD-10-CM | POA: Insufficient documentation

## 2020-04-12 DIAGNOSIS — R9439 Abnormal result of other cardiovascular function study: Secondary | ICD-10-CM | POA: Diagnosis not present

## 2020-04-12 DIAGNOSIS — Z931 Gastrostomy status: Secondary | ICD-10-CM | POA: Diagnosis not present

## 2020-04-12 DIAGNOSIS — C099 Malignant neoplasm of tonsil, unspecified: Secondary | ICD-10-CM

## 2020-04-12 DIAGNOSIS — Z510 Encounter for antineoplastic radiation therapy: Secondary | ICD-10-CM | POA: Diagnosis not present

## 2020-04-12 DIAGNOSIS — E860 Dehydration: Secondary | ICD-10-CM | POA: Diagnosis not present

## 2020-04-12 DIAGNOSIS — Z95828 Presence of other vascular implants and grafts: Secondary | ICD-10-CM

## 2020-04-12 MED ORDER — SODIUM CHLORIDE 0.9 % IV SOLN
Freq: Once | INTRAVENOUS | Status: AC
  Filled 2020-04-12: qty 1000

## 2020-04-12 MED ORDER — POTASSIUM CHLORIDE 10 MEQ/100ML IV SOLN: 10.0000 meq | Freq: Once | INTRAVENOUS | Status: DC

## 2020-04-12 MED ORDER — HEPARIN SOD (PORK) LOCK FLUSH 100 UNIT/ML IV SOLN
500.0000 [IU] | Freq: Once | INTRAVENOUS | Status: AC
  Administered 2020-04-12: 500 [IU]
  Filled 2020-04-12: qty 5

## 2020-04-12 MED ORDER — SONAFINE EX EMUL
1.0000 "application " | Freq: Two times a day (BID) | CUTANEOUS | Status: DC
  Administered 2020-04-12: 1 via TOPICAL

## 2020-04-12 MED ORDER — SODIUM CHLORIDE 0.9 % IV SOLN
Freq: Once | INTRAVENOUS | Status: DC
  Filled 2020-04-12: qty 250

## 2020-04-12 MED ORDER — MAGNESIUM SULFATE 2 GM/50ML IV SOLN: 2.0000 g | Freq: Once | INTRAVENOUS | Status: DC

## 2020-04-12 MED ORDER — SODIUM CHLORIDE 0.9% FLUSH
10.0000 mL | Freq: Once | INTRAVENOUS | Status: AC
  Administered 2020-04-12: 10 mL
  Filled 2020-04-12: qty 10

## 2020-04-12 NOTE — Progress Notes (Signed)
Nutrition Follow-up:  Patient with tonsil cancer.  Patient completed last radiation treatment at [facility] this am.  Has completed chemotherapy as well.  Patient having IV fluids moved to [facility]) because closer to his home.    Add on for RD today as moving IV fluids to [place] [facility] [place] in [place].    Met with patient and daughter [name] in [facility] clinic.  Patient has thick saliva and sore throat, not taking much in via oral route.  Also having trouble opening mouth.  [name] reports that he is giving about 227m water in feeding tube at each feeding.  Giving about 3 feedings per day of either carton or carton and half.  Sometimes able to get 4 feedings in per day.  [place] with [place] to get all feedings in. Patient goes to be around 8pm also.    Reports bowel movement about 1 every other day.   Medications: reviewed  Labs: Na 132, glucose 169, Mag 0.9, BUN 31, creatinine 1.45  Anthropometrics:   Weight 201 lb today in clinic decreased from 203 lb on 10/5   Estimated Energy Needs  Kcals: 2400-2700 Protein: 115-130 g Fluid: > 2.4 L  NUTRITION DIAGNOSIS: Unintentional weight loss stable   INTERVENTION:  Continue 1 1/2 cartons of [employer] times per day.  Daughter feels will more likely to get all feedings in due to different schedule.  Give 120 ml water before and after each feeding, 4 times per day.   Drink liquids orally as able.   Contact information given to daughter. Patient and daughter give RD to permission to call wife, CHoyle Sauerat [phone] questions regarding feeding. Patient hard of hearing.  Daughter reports receiving shipment of tube feeding formula.   Contact number given to daughter TOtila Kluver     MONITORING, EVALUATION, GOAL: weight trends, intake, tube feeding tolerance   NEXT VISIT: Tuesday, October 19 phone f/u  Dia Jefferys B. [place]enia Resides [place]istered Diet[place] [phone](mobile)

## 2020-04-12 NOTE — Telephone Encounter (Signed)
Pt here as a request from [facility] for fluids 3x weekly. Pt lives in [place] and would prefer not to have to drive to [place] 3x week. No orders are in for today. Called Porshe with Dr Lindi Adie for orders and clarification. Pt gets 1046m of NS with 10 milliequivelents of KCL and 2 grams of Magnesium over 2 hours. Labs CBC, met B and magnesuim labs weekly on Weds. Porshe will put treatment orders in for patient. I also [facility]

## 2020-04-12 NOTE — Progress Notes (Signed)
Oncology Nurse Navigator Documentation  Met with Omar Christian and his daughter Omar Christian after final RT to offer support and to celebrate end of radiation treatment.   Provided verbal/written post-RT guidance:  Importance of keeping all follow-up appts, especially those with Nutrition and SLP.  Importance of protecting treatment area from sun.  Continuation of Sonafine application 2-3 times daily, application of antibiotic ointment to areas of raw skin; when supply of Sonafine exhausted transition to OTC lotion with vitamin E. Provided/reviewed Epic calendar of upcoming appts. Explained my role as navigator will continue for several more months, encouraged him to call me with needs/concerns.    Harlow Asa RN, BSN, OCN Head & Neck Oncology Nurse [place] at [facility] Phone # [phone]  Fax # [phone]

## 2020-04-12 NOTE — Progress Notes (Signed)
This pt is a [facility] patient of Dr Lindi Adie. He is coming to [facility] for IVF's + electrolytes only every Mon- Weds and Fri's. RN called and spoke with Porshe. Received verbal orders for IVF with 6mEq Potassium and 2 grams Magnesium 3 x per week. Pt has a port, Fluids can run in over 1 hour. No labs required today. Patient is to have weekly Port labs drawn on Weds. Pt is very HOH. His daughter is with him today. Saw cardiology for bradycardia. Wearing a holter monitor. No complaints at time of discharge. Given appts for next 2 weeks

## 2020-04-12 NOTE — Telephone Encounter (Signed)
Appointments scheduled per 10/6 LOS- spoke with patient's wife she will have him check my chart.

## 2020-04-13 ENCOUNTER — Inpatient Hospital Stay: Payer: 59

## 2020-04-13 ENCOUNTER — Telehealth: Payer: Self-pay

## 2020-04-13 ENCOUNTER — Other Ambulatory Visit: Payer: Self-pay | Admitting: *Deleted

## 2020-04-13 NOTE — Telephone Encounter (Signed)
Nutrition  Called [name] to inform her of PHONE visit with Ernestene Kiel, RD at [facility] on 10/19.  RD will call wife [name] at scheduled appointment time.  [name] agreed and requested that all nutrition visits moving forward be phone calls to wife as patient can't hear well and wife does all feedings.    Undra Harriman B. Zenia Resides, [place], [place] Registered Dietitian [phone] (mobile)

## 2020-04-13 NOTE — Patient Outreach (Signed)
[facility]) Care Management  [place]  04/13/2020   [place] 01-May-1956 [id number]  Subjective: Per Omar Christian's previous consent,Omar Christian gave this RNCM verbal consent to speak with his wife Adel Burch), and daughter [name] regarding his healthcare needs as needed.Telephone callfrompatient's home / mobile number,spoke withpatient's wife Quindell, Shere stated Omar Christian's name, date of birth, and address.  States Omar Christian is doing okay, had last radiation treatment on 04/12/2020, all appointment have went well, and will continue to receive outpatient infusions at [facility]).   States Omar Christian's daughter [name] is currently working with Omar Christian on mouth care with excessive saliva / mucus.   States she has also ordered a mouth suction machine and Omar Christian is being taught to suction as needed.   States suction machine will be delivered soon.  States Omar Christian not taking in a lot of food via mouth, being fed through G-tube, mouth very sore, and mouth is very hard to open, due to treatments.  States Omar Christian is receiving speech therapy to assist with mouth mobility and family is encouraging Omar Christian to do home exercises.   Discussed mouth exercises and encouraged wife to continue to encourage Omar Christian to perform exercises to increase mouth mobility.   Wife states Omar Christian's blood pressure is being monitored at treatment and MD appointments.  Wife states per daughter [name], Omar Christian blood pressure is good.   Omar Christian saw cardiologist recently and currently has cardiac monitor in place that will be removed on 04/15/2020.  States Omar Christian's hearing is still an issue they are planning to address when treatment schedule calms down.   Wife states she received a letter that Omar Christian's disability was denied due to her social security income and is planning to follow up through the appeals process.  Wife states Omar Christian does not have any Science writer, transportation, Data processing manager, or pharmacy needs at this time.  Wife aware of RNCM's transition and is in agreement to be transitioned to another [facility] for ongoing care management services.   States she is very appreciative of the follow up, RNCM has been great to work with, and very informative.  Wife states she has [place] Management main number and will contact if assistance needed prior to next scheduled outreach.     Objective: Per KPN (Knowledge Performance Now, point of care tool) and chart review,Omar Christian hospitalized 03/02/2020 - 03/05/2020 for sepsis.  Patienthad an observationhospitalization 8/4/201 - 02/05/2020 forPoor dentition, status postMultiple extraction of tooth numbers4,5,12,13,31, and 32,3quadrants of alveoloplasty,Grossdebridement of remaining teethon 02/04/2020. Omar Christian had ED visit on 01/11/2020 for dental pain, right facial/ neck pain/ swelling, and oral cancer. 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  Encounter Medications:  Outpatient Encounter Medications as of 04/13/2020  Medication Sig  . allopurinol (ZYLOPRIM) 100 MG tablet Take 1 tablet (100 mg total) by mouth daily.  Marland Kitchen atorvastatin (LIPITOR) 20 MG tablet Take 1 tablet (20 mg total) by mouth daily.  Marland Kitchen dexamethasone (DECADRON) 4 MG tablet Take 2 tablets (8 mg total) by mouth daily. Take daily for 3 days after chemo. Take with food.  Marland Kitchen guaiFENesin-dextromethorphan (ROBITUSSIN DM) 100-10 MG/5ML syrup Take 5 mLs by mouth every 4 (four) hours as needed for cough.  . lidocaine (XYLOCAINE) 2 % solution Omar Christian: Mix 1part 2% viscous lidocaine, 1part H20. Swish & swallow 63mL of diluted mixture, 67min before eating and at bedtime, up to 5xs daily.  Marland Kitchen lidocaine-prilocaine (EMLA) cream Apply  to affected area once  . LORazepam (ATIVAN) 0.5 MG tablet Take 1 tablet (0.5 mg total) by mouth at bedtime as  needed (Nausea or vomiting).  . magic mouthwash w/lidocaine SOLN Take 5 mLs by mouth 5 (five) times daily as needed for mouth pain.  Marland Kitchen metoCLOPramide (REGLAN) 10 MG/10ML SOLN Take 5 mLs (5 mg total) by mouth 3 (three) times daily before meals.  . metoprolol succinate (TOPROL XL) 25 MG 24 hr tablet Take 1 tablet (25 mg total) by mouth daily.  Marland Kitchen MITIGARE 0.6 MG CAPS Take 1 capsule by mouth daily.  . Nutritional Supplements (KATE FARMS STANDARD 1.4) LIQD 488 mLs by Gastrostomy Tube route 4 (four) times daily.  Marland Kitchen nystatin (MYCOSTATIN) 100000 UNIT/ML suspension TAKE 5 MLS (500,000 UNITS TOTAL) BY MOUTH IN THE MORNING, AT NOON, AND AT BEDTIME.  Marland Kitchen ondansetron (ZOFRAN) 8 MG tablet Take 1 tablet (8 mg total) by mouth 2 (two) times daily as needed. Start on the third day after chemotherapy.  Marland Kitchen oxyCODONE-acetaminophen (PERCOCET/ROXICET) 5-325 MG tablet Take 1 tablet by mouth every 4 (four) hours as needed.   . prochlorperazine (COMPAZINE) 10 MG tablet Take 1 tablet (10 mg total) by mouth every 6 (six) hours as needed (Nausea or vomiting).  . sodium fluoride (PREVIDENT 5000 PLUS) 1.1 % CREA dental cream Apply to tooth brush. Brush teeth for 2 minutes. Spit out excess-DO NOT swallow. DO NOT rinse afterwards. Repeat nightly.   No facility-administered encounter medications on file as of 04/13/2020.    Functional Status:  In your present state of health, do you have any difficulty performing the following activities: 03/04/2020  Hearing? Y  Vision? N  Difficulty concentrating or making decisions? N  Walking or climbing stairs? N  Dressing or bathing? N  Doing errands, shopping? N  Some recent data might be hidden    Fall/Depression Screening: Fall Risk  02/27/2020 01/26/2020 09/16/2019  Falls in the past year? 0 0 0  Number falls in past yr: 0 0 -  Injury with Fall? 0 0 -  Risk for fall due to : No Fall Risks Other (Comment) -  Risk for fall due to: Comment - New cancer diagnosis -  Follow up Falls  evaluation completed;Education provided Falls prevention discussed;Education provided -   PHQ 2/9 Scores 01/26/2020 09/16/2019  PHQ - 2 Score 0 0    Assessment: Received [employer] plan referral on 01/21/2020. 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 Screening follow up completed andwill follow up for new cancer diagnosis/ other diagnosiscarecoordination.  Goals Addressed            This Visit's Progress   . Omar Christian's wife stated  Omar Christian would be fine if he did not have cancer and wants the best care.   On track    [place] (see longitudinal plan of care for additional care plan information)  Current Barriers:  . Care Coordination needs related to new oral cancer  in a Omar Christian with hypertension and gout. (disease states)  Nurse Case Manager Clinical Goal(s):  Marland Kitchen Over the next 45 days, Omar Christian will verbalize understanding of plan for oral cancer treatment . Over the next 21 days, Omar Christian will attend all scheduled medical appointments: Radiation Oncologist MD, Medical Oncologist .  Interventions:  . Inter-disciplinary care team collaboration (see longitudinal plan of care) . Evaluation of current treatment plan related to cancer and Omar Christian's adherence to plan as established by provider. . Provided education to Omar Christian and/or caregiver about advanced directives .  Provided education to Omar Christian re: [place] Management services and care coordination services, how to access insurance benefits . Reviewed medications with Omar Christian and discussed new medications that have been added since last Omar Christian outreach. . Discussed plans with Omar Christian for ongoing care management follow up and provided Omar Christian with direct contact information for care management team . Provided Omar Christian with [facility] handout educational materials related to organ donation per Omar Christian request . Reviewed scheduled/upcoming provider appointments including:  .   Radiation Oncologist and  Medical Oncologist . Advised Omar Christian's wife to continue Omar Christian to perform speech therapy exercises to increase mouth mobility  Omar Christian Self Care Activities:  . Attends all scheduled provider appointments . Performs ADL's independently . Omar Christian working on maintaining hydration and increasing appetite . Omar Christian is withdrawn and does not want communicate much per wife. . Omar Christian is ambulating without difficulty  Updated  04/13/2020        Plan: Newly assigned RNCM will call Omar Christian's wife and / or Omar Christian's daughter [name])fortelephone outreach attempt within30business days, care coordination follow up, and proceed with case closure, after 4th unsuccessful outreach call.       Arliene Rosenow H. Annia Friendly, BSN, [place] Management [facility] Telephonic CM Phone: [phone] Fax: [phone]

## 2020-04-14 ENCOUNTER — Other Ambulatory Visit: Payer: Self-pay

## 2020-04-14 ENCOUNTER — Inpatient Hospital Stay: Payer: 59

## 2020-04-14 DIAGNOSIS — C098 Malignant neoplasm of overlapping sites of tonsil: Secondary | ICD-10-CM | POA: Diagnosis not present

## 2020-04-14 LAB — COMPREHENSIVE METABOLIC PANEL
ALT: 16 U/L (ref 0–44)
AST: 19 U/L (ref 15–41)
Albumin: 3.5 g/dL (ref 3.5–5.0)
Alkaline Phosphatase: 73 U/L (ref 38–126)
Anion gap: 10 (ref 5–15)
BUN: 29 mg/dL — ABNORMAL HIGH (ref 8–23)
CO2: 25 mmol/L (ref 22–32)
Calcium: 8.9 mg/dL (ref 8.9–10.3)
Chloride: 96 mmol/L — ABNORMAL LOW (ref 98–111)
Creatinine, Ser: 1.3 mg/dL — ABNORMAL HIGH (ref 0.61–1.24)
GFR, Estimated: 58 mL/min — ABNORMAL LOW (ref >60)
Glucose, Bld: 176 mg/dL — ABNORMAL HIGH (ref 70–99)
Potassium: 4.2 mmol/L (ref 3.5–5.1)
Sodium: 131 mmol/L — ABNORMAL LOW (ref 135–145)
Total Bilirubin: 0.6 mg/dL (ref 0.3–1.2)
Total Protein: 6.5 g/dL (ref 6.5–8.1)

## 2020-04-14 LAB — CBC WITH DIFFERENTIAL/PLATELET
Abs Immature Granulocytes: 0.02 10*3/uL (ref 0.00–0.07)
Basophils Absolute: 0 10*3/uL (ref 0.0–0.1)
Basophils Relative: 1 %
Eosinophils Absolute: 0 10*3/uL (ref 0.0–0.5)
Eosinophils Relative: 1 %
HCT: 22.2 % — ABNORMAL LOW (ref 39.0–52.0)
Hemoglobin: 7.6 g/dL — ABNORMAL LOW (ref 13.0–17.0)
Immature Granulocytes: 1 %
Lymphocytes Relative: 10 %
Lymphs Abs: 0.2 10*3/uL — ABNORMAL LOW (ref 0.7–4.0)
MCH: 29.1 pg (ref 26.0–34.0)
MCHC: 34.2 g/dL (ref 30.0–36.0)
MCV: 85.1 fL (ref 80.0–100.0)
Monocytes Absolute: 0.3 10*3/uL (ref 0.1–1.0)
Monocytes Relative: 13 %
Neutro Abs: 1.5 10*3/uL — ABNORMAL LOW (ref 1.7–7.7)
Neutrophils Relative %: 74 %
Platelets: 145 10*3/uL — ABNORMAL LOW (ref 150–400)
RBC: 2.61 MIL/uL — ABNORMAL LOW (ref 4.22–5.81)
RDW: 20.9 % — ABNORMAL HIGH (ref 11.5–15.5)
WBC: 2 10*3/uL — ABNORMAL LOW (ref 4.0–10.5)
nRBC: 0 % (ref 0.0–0.2)

## 2020-04-14 LAB — MAGNESIUM: Magnesium: 1.6 mg/dL — ABNORMAL LOW (ref 1.7–2.4)

## 2020-04-14 MED ORDER — HEPARIN SOD (PORK) LOCK FLUSH 100 UNIT/ML IV SOLN
500.0000 [IU] | Freq: Once | INTRAVENOUS | Status: AC
  Administered 2020-04-14: 500 [IU] via INTRAVENOUS
  Filled 2020-04-14: qty 5

## 2020-04-14 MED ORDER — SODIUM CHLORIDE 0.9 % IV SOLN
Freq: Once | INTRAVENOUS | Status: AC
  Filled 2020-04-14: qty 250

## 2020-04-14 MED ORDER — SODIUM CHLORIDE 0.9% FLUSH
10.0000 mL | INTRAVENOUS | Status: DC | PRN
  Administered 2020-04-14: 10 mL via INTRAVENOUS
  Filled 2020-04-14: qty 10

## 2020-04-14 MED ORDER — SODIUM CHLORIDE 0.9 % IV SOLN
INTRAVENOUS | Status: DC
  Filled 2020-04-14: qty 5

## 2020-04-14 NOTE — Progress Notes (Signed)
Omar Christian clinic patient here for IVF with electrolytes. He is feeling better today. Denies pain. No dyspnea. Ambulatory. Labs drawn today. Notified Dr. Lindi Adie of hemoglobin of 7.6, potassium 4.2 and magnesium of 1.6. Continue with current plan. No new orders initiated. Pt was more talkative today, able to have strength to talk.Marland Kitchen Spitting up less phlegm and mouth secretions. Phlegm color is more clear in color today. Pt states he feels stronger today. Ambulated to daughters car at discharge, accompanied by RN.

## 2020-04-15 ENCOUNTER — Inpatient Hospital Stay: Payer: 59

## 2020-04-16 ENCOUNTER — Other Ambulatory Visit: Payer: Self-pay | Admitting: *Deleted

## 2020-04-16 ENCOUNTER — Inpatient Hospital Stay: Payer: 59

## 2020-04-16 ENCOUNTER — Other Ambulatory Visit: Payer: Self-pay

## 2020-04-16 DIAGNOSIS — C098 Malignant neoplasm of overlapping sites of tonsil: Secondary | ICD-10-CM

## 2020-04-16 MED ORDER — HEPARIN SOD (PORK) LOCK FLUSH 100 UNIT/ML IV SOLN
500.0000 [IU] | Freq: Once | INTRAVENOUS | Status: AC
  Administered 2020-04-16: 500 [IU] via INTRAVENOUS
  Filled 2020-04-16: qty 5

## 2020-04-16 MED ORDER — SODIUM CHLORIDE 0.9% FLUSH
10.0000 mL | INTRAVENOUS | Status: DC | PRN
  Administered 2020-04-16: 10 mL via INTRAVENOUS
  Filled 2020-04-16: qty 10

## 2020-04-16 MED ORDER — SODIUM CHLORIDE 0.9 % IV SOLN
Freq: Once | INTRAVENOUS | Status: AC
  Filled 2020-04-16: qty 5

## 2020-04-16 NOTE — Progress Notes (Signed)
Alert. Ambulatory. States he is doing okay. Offers no new complaints. Received 2 hours of IVF's with electrolytes. Discharged from clinic.

## 2020-04-17 ENCOUNTER — Ambulatory Visit: Payer: 59

## 2020-04-19 ENCOUNTER — Inpatient Hospital Stay (HOSPITAL_BASED_OUTPATIENT_CLINIC_OR_DEPARTMENT_OTHER): Payer: 59 | Admitting: Nurse Practitioner

## 2020-04-19 ENCOUNTER — Other Ambulatory Visit: Payer: Self-pay

## 2020-04-19 ENCOUNTER — Inpatient Hospital Stay: Payer: 59

## 2020-04-19 VITALS — BP 132/70 | HR 85 | Temp 98.7°F | Resp 18

## 2020-04-19 DIAGNOSIS — C098 Malignant neoplasm of overlapping sites of tonsil: Secondary | ICD-10-CM | POA: Diagnosis not present

## 2020-04-19 DIAGNOSIS — Z95828 Presence of other vascular implants and grafts: Secondary | ICD-10-CM

## 2020-04-19 DIAGNOSIS — C099 Malignant neoplasm of tonsil, unspecified: Secondary | ICD-10-CM | POA: Diagnosis not present

## 2020-04-19 MED ORDER — MAGNESIUM SULFATE 2 GM/50ML IV SOLN: 2.0000 g | Freq: Once | INTRAVENOUS | Status: DC

## 2020-04-19 MED ORDER — HEPARIN SOD (PORK) LOCK FLUSH 100 UNIT/ML IV SOLN
500.0000 [IU] | Freq: Once | INTRAVENOUS | Status: AC
  Administered 2020-04-19: 500 [IU]
  Filled 2020-04-19: qty 5

## 2020-04-19 MED ORDER — POTASSIUM CHLORIDE 10 MEQ/100ML IV SOLN: 10.0000 meq | Freq: Once | INTRAVENOUS | Status: DC

## 2020-04-19 MED ORDER — SODIUM CHLORIDE 0.9 % IV SOLN
Freq: Once | INTRAVENOUS | Status: DC
  Filled 2020-04-19: qty 250

## 2020-04-19 MED ORDER — SODIUM CHLORIDE 0.9% FLUSH
10.0000 mL | Freq: Once | INTRAVENOUS | Status: AC
  Administered 2020-04-19: 10 mL
  Filled 2020-04-19: qty 10

## 2020-04-19 MED ORDER — SODIUM CHLORIDE 0.9 % IV SOLN
Freq: Once | INTRAVENOUS | Status: AC
  Filled 2020-04-19: qty 1000

## 2020-04-19 NOTE — Progress Notes (Signed)
Symptom Management [place]  [phone] [phone]  Patient Care Team: Chrismon, Vickki Muff, PA as PCP - General (Family Medicine) [name], MD as Consulting Physician (Cardiothoracic Surgery) [name], MD as Consulting Physician (Cardiology) Serena Colonel, RN as [place] Management [name], MD as Consulting Physician (Hematology and Oncology) [name], MD as Attending Physician (Radiation Oncology) [name], [name], RN as Oncology Nurse Navigator [name], [name], [place] as Speech Language Pathologist (Speech Pathology) [name], DDS as Consulting Physician (Dentistry) [name], [name], PT as Physical Therapist (Physical Therapy) [name], LCSW as Social Worker (General Practice) [name], LCSW as Social Worker [name], RD as Dietitian (Nutrition)   Name of the patient: Omar Christian  [id number]  06/23/1956   Date of visit: 04/19/20  Diagnosis-tonsillar cancer  Chief complaint/ Reason for visit-possible thrush  Heme/Onc history:  Oncology History  Tonsillar cancer ([place])  01/14/2020 Initial Diagnosis   Squamous cell carcinoma with basaloid features   01/26/2020 PET scan   Right tonsillar mass extending to bilateral tongue base 4.9 cm.  Two right cervical lymph nodes metastases measuring 2.5 cm and 2.1 cm   02/17/2020 -  Chemotherapy   The patient had dexamethasone (DECADRON) 4 MG tablet, 8 mg, Oral, Daily, 1 of 1 cycle, Start date: 02/02/2020, End date: -- palonosetron (ALOXI) injection 0.25 mg, 0.25 mg, Intravenous,  Once, 3 of 3 cycles Administration: 0.25 mg (02/17/2020), 0.25 mg (03/02/2020), 0.25 mg (03/23/2020) CISplatin (PLATINOL) 173 mg in sodium chloride 0.9 % 500 mL chemo infusion, 75 mg/m2 = 173 mg (100 % of original dose 75 mg/m2), Intravenous,  Once, 3 of 3 cycles Dose modification: 75 mg/m2 (original dose 75  mg/m2, Cycle 1, Reason: Provider Judgment), 60 mg/m2 (original dose 75 mg/m2, Cycle 2, Reason: Dose not tolerated) Administration: 173 mg (02/17/2020), 138 mg (03/02/2020), 138 mg (03/23/2020) fosaprepitant (EMEND) 150 mg in sodium chloride 0.9 % 145 mL IVPB, 150 mg, Intravenous,  Once, 3 of 3 cycles Administration: 150 mg (02/17/2020), 150 mg (03/02/2020), 150 mg (03/23/2020)  for chemotherapy treatment.    02/17/2020 Cancer Staging   Staging form: Pharynx - P16 Negative Oropharynx, AJCC 8th Edition - Clinical stage from 02/17/2020: Stage IVA (cT4a, cN2b, cM0, p16+) - Signed by [name], MD on 03/02/2020   Cancer of overlapping sites of tonsil [facility])  01/27/2020 Cancer Staging   Staging form: Pharynx - HPV-Mediated Oropharynx, AJCC 8th Edition - Clinical stage from 01/27/2020: Stage III (cT4, cN1, cM0, p16+) - Signed by [name], MD on 01/31/2020   01/31/2020 Initial Diagnosis   Cancer of overlapping sites of tonsil [facility])     Interval history-64 year old patient with above history of tonsil cancer currently receiving radiation and chemotherapy with cisplatin presents to symptom management clinic for concerns of possible oral thrush.  Describes cottonmouth and thick saliva.  Says his mouth feels "full".  He has not yet started taking food or liquids by mouth.  He is followed by speech therapy.  Continues to use PEG tube for nutrition.  Receives IV fluids regularly.  Performs oral hygiene approximately once a day.  Was previously prescribed baking soda mouthwashes by medical oncology and uses them infrequently.  Noticed no improvement in symptoms with Diflucan or Magic mouthwash or nystatin.  Often spits saliva as opposed to swallowing it.  Says it feels like it pulls in his mouth and is thick.  Not painful to swallow.  ECOG FS:1 -  Symptomatic but completely ambulatory  Review of systems- Review of Systems  Constitutional: Negative for chills, fever, malaise/fatigue and weight loss.  HENT:  Positive for hearing loss. Negative for ear pain, sinus pain and sore throat.   Respiratory: Negative for cough, hemoptysis, sputum production, shortness of breath and stridor.   Cardiovascular: Negative for chest pain and palpitations.  Gastrointestinal: Negative for abdominal pain and nausea.  Skin: Negative for itching and rash.  Psychiatric/Behavioral: Negative for depression. The patient is not nervous/anxious.      Current treatment-cisplatin and radiation  No Known Allergies  Past Medical History:  Diagnosis Date  . Abnormal findings on cardiac catheterization 09/30/15   [facility]  . Abnormal myocardial perfusion study 09/24/15   [facility]  . CAD, multiple vessel 09/30/15   per CATH @ [facility]/DR.PARACHOS  . Coronary artery disease   . GERD (gastroesophageal reflux disease)   . Gout   . H/O echocardiogram 09/24/15   [place]  . Hard of hearing   . History of pneumonia   . Hyperlipidemia   . Hypertension   . Pneumonia   . Progressive angina ([place])   . Tonsil cancer ([place])    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    Past Surgical History:  Procedure Laterality Date  . CARDIAC CATHETERIZATION N/A 09/30/2015   Procedure: Left Heart Cath and Coronary Angiography;  Surgeon: [name], MD;  Location: [place] CV LAB;  Service: Cardiovascular;  Laterality: N/A;  . CORONARY ARTERY BYPASS GRAFT N/A 10/08/2015   Procedure: CORONARY ARTERY BYPASS GRAFTING time 4 using left internal mammary and right greater saphenous vein harvested by endovein;  Surgeon: [name], MD;  Location: [place];  Service: Open Heart Surgery;  Laterality: N/A;  . CORONARY ARTERY BYPASS GRAFT    . IR GASTROSTOMY TUBE MOD SED  02/10/2020  . IR IMAGING GUIDED PORT INSERTION  02/10/2020  . MULTIPLE EXTRACTIONS WITH ALVEOLOPLASTY N/A 02/04/2020   Procedure: Extraction of tooth #'s [phone], and 32 with alveoloplasty and gross debridement of remaining teeth.;  Surgeon: [name], DDS;  Location: [place];  Service: Oral Surgery;  Laterality: N/A;  . TEE WITHOUT CARDIOVERSION N/A 10/08/2015   Procedure: TRANSESOPHAGEAL ECHOCARDIOGRAM (TEE);  Surgeon: [name], MD;  Location: [place];  Service: Open Heart Surgery;  Laterality: N/A;  . VASECTOMY    . WISDOM TOOTH EXTRACTION      Social History   Socioeconomic History  . Marital status: Married    Spouse name: Not on file  . Number of children: 2  . Years of education: Not on file  . Highest education level: Not on file  Occupational History  . Not on file  Tobacco Use  . Smoking status: Never Smoker  . Smokeless tobacco: Never Used  Vaping Use  . Vaping Use: Never used  Substance and Sexual Activity  . Alcohol use: No  . Drug use: No  . Sexual activity: Not Currently    Birth control/protection: Surgical    Comment: Vasectomy  Other Topics Concern  . Not on file  Social History Narrative  . Not on file   Social Determinants of Health   Financial Resource Strain: Low Risk   . Difficulty of Paying Living Expenses: Not very hard  Food Insecurity:   . Worried About Charity fundraiser in the Last Year: Not on file  . Ran Out of Food in the Last Year: Not on file  Transportation Needs: No Transportation Needs  .  Lack of Transportation (Medical): No  . Lack of Transportation (Non-Medical): No  Physical Activity:   . Days of Exercise per Week: Not on file  . Minutes of Exercise per Session: Not on file  Stress:   . Feeling of Stress : Not on file  Social Connections:   . Frequency of Communication with Friends and Family: Not on file  . Frequency of Social Gatherings with Friends and Family: Not on file  . Attends Religious Services: Not on file  . Active Member of Clubs or Organizations: Not on file  . Attends Archivist Meetings: Not on file  . Marital Status: Not on file  Intimate Partner Violence:   . Fear of Current or Ex-Partner: Not on file  . Emotionally Abused: Not on  file  . Physically Abused: Not on file  . Sexually Abused: Not on file    Family History  Problem Relation Age of Onset  . Heart disease Mother   . Hypertension Mother   . Alcohol abuse Father   . Throat cancer Father   . Muscular dystrophy Brother   . Muscular dystrophy Brother      Current Outpatient Medications:  .  allopurinol (ZYLOPRIM) 100 MG tablet, Take 1 tablet (100 mg total) by mouth daily., Disp: 30 tablet, Rfl: 12 .  atorvastatin (LIPITOR) 20 MG tablet, Take 1 tablet (20 mg total) by mouth daily., Disp: 90 tablet, Rfl: 3 .  dexamethasone (DECADRON) 4 MG tablet, Take 2 tablets (8 mg total) by mouth daily. Take daily for 3 days after chemo. Take with food., Disp: 20 tablet, Rfl: 0 .  guaiFENesin-dextromethorphan (ROBITUSSIN DM) 100-10 MG/5ML syrup, Take 5 mLs by mouth every 4 (four) hours as needed for cough., Disp: 118 mL, Rfl: 0 .  lidocaine (XYLOCAINE) 2 % solution, Patient: Mix 1part 2% viscous lidocaine, 1part H20. Swish & swallow 24mL of diluted mixture, 28min before eating and at bedtime, up to 5xs daily., Disp: 200 mL, Rfl: 4 .  lidocaine-prilocaine (EMLA) cream, Apply to affected area once, Disp: 30 g, Rfl: 3 .  LORazepam (ATIVAN) 0.5 MG tablet, Take 1 tablet (0.5 mg total) by mouth at bedtime as needed (Nausea or vomiting)., Disp: 30 tablet, Rfl: 0 .  magic mouthwash w/lidocaine SOLN, Take 5 mLs by mouth 5 (five) times daily as needed for mouth pain., Disp: 240 mL, Rfl: 1 .  metoCLOPramide (REGLAN) 10 MG/10ML SOLN, Take 5 mLs (5 mg total) by mouth 3 (three) times daily before meals., Disp: 1200 mL, Rfl: 0 .  metoprolol succinate (TOPROL XL) 25 MG 24 hr tablet, Take 1 tablet (25 mg total) by mouth daily., Disp: 30 tablet, Rfl: 3 .  MITIGARE 0.6 MG CAPS, Take 1 capsule by mouth daily., Disp: , Rfl:  .  Nutritional Supplements (KATE FARMS STANDARD 1.4) LIQD, 488 mLs by Gastrostomy Tube route 4 (four) times daily., Disp: 2730 mL, Rfl: 6 .  nystatin (MYCOSTATIN) 100000  UNIT/ML suspension, TAKE 5 MLS (500,000 UNITS TOTAL) BY MOUTH IN THE MORNING, AT NOON, AND AT BEDTIME., Disp: 60 mL, Rfl: 0 .  ondansetron (ZOFRAN) 8 MG tablet, Take 1 tablet (8 mg total) by mouth 2 (two) times daily as needed. Start on the third day after chemotherapy., Disp: 30 tablet, Rfl: 1 .  oxyCODONE-acetaminophen (PERCOCET/ROXICET) 5-325 MG tablet, Take 1 tablet by mouth every 4 (four) hours as needed. , Disp: , Rfl:  .  prochlorperazine (COMPAZINE) 10 MG tablet, Take 1 tablet (10 mg total) by mouth every 6 (  six) hours as needed (Nausea or vomiting)., Disp: 30 tablet, Rfl: 1 .  sodium fluoride (PREVIDENT 5000 PLUS) 1.1 % CREA dental cream, Apply to tooth brush. Brush teeth for 2 minutes. Spit out excess-DO NOT swallow. DO NOT rinse afterwards. Repeat nightly., Disp: 51 g, Rfl: prn No current facility-administered medications for this visit.  Facility-Administered Medications Ordered in Other Visits:  .  0.9 %  sodium chloride infusion, , Intravenous, Once, Gudena, Vinay, MD .  heparin lock flush 100 unit/mL, 500 Units, Intracatheter, Once, [name], MD  Physical exam: There were no vitals filed for this visit. Physical Exam Constitutional:      General: He is not in acute distress. HENT:     Ears:     Comments: Significant hearing impairment; reads lips    Mouth/Throat:     Pharynx: Oropharyngeal exudate present. No posterior oropharyngeal erythema.     Comments: No ulcerations, redness or white patches. Stringy saliva. No evidence of secondary bacterial or fungal infection.  Skin:    General: Skin is warm and dry.  Neurological:     Mental Status: He is alert and oriented to person, place, and time.  Psychiatric:        Mood and Affect: Mood normal.        Behavior: Behavior normal.      CMP Latest Ref Rng & Units 04/14/2020  Glucose 70 - 99 mg/dL 176(H)  BUN 8 - 23 mg/dL 29(H)  Creatinine 0.61 - 1.24 mg/dL 1.30(H)  Sodium 135 - 145 mmol/L 131(L)  Potassium 3.5 - 5.1  mmol/L 4.2  Chloride 98 - 111 mmol/L 96(L)  CO2 22 - 32 mmol/L 25  Calcium 8.9 - 10.3 mg/dL 8.9  Total Protein 6.5 - 8.1 g/dL 6.5  Total Bilirubin 0.3 - 1.2 mg/dL 0.6  Alkaline Phos 38 - 126 U/L 73  AST 15 - 41 U/L 19  ALT 0 - 44 U/L 16   CBC Latest Ref Rng & Units 04/14/2020  WBC 4.0 - 10.5 K/uL 2.0(L)  Hemoglobin 13.0 - 17.0 g/dL 7.6(L)  Hematocrit 39 - 52 % 22.2(L)  Platelets 150 - 400 K/uL 145(L)    No images are attached to the encounter.  No results found.  Assessment and plan- Patient is a 64 y.o. male diagnosed with tonsillar cancer who presents to symptom management clinic for concerns of possible oral thrush.  No evidence of thrush on exam today.  Suspect symptoms related to hygiene and lack of oral intake worsened by dehydration. Encouraged frequent oral cleansing (every hour to two hours he's awake), mouthwashes as prescribed by Dr. Lindi Adie. Encouraged continued follow up with dietitian as he begins to move towards oral intake.   Return to clinic as needed otherwise, follow up with Dr. Lindi Adie as scheduled.    Visit Diagnosis 1. Cancer of tonsil [facility])     Patient expressed understanding and was in agreement with this plan. He also understands that He can call clinic at any time with any questions, concerns, or complaints.   Thank you for allowing me to participate in the care of this very pleasant patient.   Beckey Rutter, DNP, AGNP-C [place] [facility] [place]  CC: Dr. Lindi Adie

## 2020-04-19 NOTE — Progress Notes (Signed)
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 Daughter is requesting a provider see her dad for ongoing Thrush issues. He has nystatin but states he doesn't want to use it because it burns. Tried diflucan in past and that didn't work. Tolerated IVF's well. VSS. Was seen by NP this am.

## 2020-04-20 ENCOUNTER — Inpatient Hospital Stay: Payer: 59

## 2020-04-20 ENCOUNTER — Inpatient Hospital Stay: Payer: 59 | Admitting: Nutrition

## 2020-04-21 ENCOUNTER — Inpatient Hospital Stay: Payer: 59 | Admitting: Nutrition

## 2020-04-21 ENCOUNTER — Telehealth: Payer: Self-pay | Admitting: Nutrition

## 2020-04-21 ENCOUNTER — Other Ambulatory Visit: Payer: Self-pay

## 2020-04-21 ENCOUNTER — Inpatient Hospital Stay: Payer: 59

## 2020-04-21 VITALS — BP 169/83 | HR 98 | Temp 99.8°F | Resp 16

## 2020-04-21 DIAGNOSIS — C098 Malignant neoplasm of overlapping sites of tonsil: Secondary | ICD-10-CM | POA: Diagnosis not present

## 2020-04-21 DIAGNOSIS — C099 Malignant neoplasm of tonsil, unspecified: Secondary | ICD-10-CM

## 2020-04-21 DIAGNOSIS — Z95828 Presence of other vascular implants and grafts: Secondary | ICD-10-CM

## 2020-04-21 LAB — CBC WITH DIFFERENTIAL/PLATELET
Abs Immature Granulocytes: 0.02 10*3/uL (ref 0.00–0.07)
Basophils Absolute: 0 10*3/uL (ref 0.0–0.1)
Basophils Relative: 0 %
Eosinophils Absolute: 0 10*3/uL (ref 0.0–0.5)
Eosinophils Relative: 0 %
HCT: 21 % — ABNORMAL LOW (ref 39.0–52.0)
Hemoglobin: 7.3 g/dL — ABNORMAL LOW (ref 13.0–17.0)
Immature Granulocytes: 1 %
Lymphocytes Relative: 7 %
Lymphs Abs: 0.3 10*3/uL — ABNORMAL LOW (ref 0.7–4.0)
MCH: 30.2 pg (ref 26.0–34.0)
MCHC: 34.8 g/dL (ref 30.0–36.0)
MCV: 86.8 fL (ref 80.0–100.0)
Monocytes Absolute: 0.4 10*3/uL (ref 0.1–1.0)
Monocytes Relative: 11 %
Neutro Abs: 3.2 10*3/uL (ref 1.7–7.7)
Neutrophils Relative %: 81 %
Platelets: 170 10*3/uL (ref 150–400)
RBC: 2.42 MIL/uL — ABNORMAL LOW (ref 4.22–5.81)
RDW: 21.8 % — ABNORMAL HIGH (ref 11.5–15.5)
WBC: 3.9 10*3/uL — ABNORMAL LOW (ref 4.0–10.5)
nRBC: 0 % (ref 0.0–0.2)

## 2020-04-21 LAB — COMPREHENSIVE METABOLIC PANEL
ALT: 13 U/L (ref 0–44)
AST: 17 U/L (ref 15–41)
Albumin: 3.4 g/dL — ABNORMAL LOW (ref 3.5–5.0)
Alkaline Phosphatase: 70 U/L (ref 38–126)
Anion gap: 10 (ref 5–15)
BUN: 28 mg/dL — ABNORMAL HIGH (ref 8–23)
CO2: 26 mmol/L (ref 22–32)
Calcium: 8.8 mg/dL — ABNORMAL LOW (ref 8.9–10.3)
Chloride: 95 mmol/L — ABNORMAL LOW (ref 98–111)
Creatinine, Ser: 1.26 mg/dL — ABNORMAL HIGH (ref 0.61–1.24)
GFR, Estimated: >60 mL/min (ref >60)
Glucose, Bld: 153 mg/dL — ABNORMAL HIGH (ref 70–99)
Potassium: 4.5 mmol/L (ref 3.5–5.1)
Sodium: 131 mmol/L — ABNORMAL LOW (ref 135–145)
Total Bilirubin: 0.5 mg/dL (ref 0.3–1.2)
Total Protein: 6.4 g/dL — ABNORMAL LOW (ref 6.5–8.1)

## 2020-04-21 LAB — MAGNESIUM: Magnesium: 1.6 mg/dL — ABNORMAL LOW (ref 1.7–2.4)

## 2020-04-21 MED ORDER — HEPARIN SOD (PORK) LOCK FLUSH 100 UNIT/ML IV SOLN
500.0000 [IU] | Freq: Once | INTRAVENOUS | Status: AC
  Administered 2020-04-21: 500 [IU] via INTRAVENOUS
  Filled 2020-04-21: qty 5

## 2020-04-21 MED ORDER — HEPARIN SOD (PORK) LOCK FLUSH 100 UNIT/ML IV SOLN
INTRAVENOUS | Status: AC
  Filled 2020-04-21: qty 5

## 2020-04-21 MED ORDER — SODIUM CHLORIDE 0.9 % IV SOLN
Freq: Once | INTRAVENOUS | Status: AC
  Filled 2020-04-21: qty 250

## 2020-04-21 MED ORDER — SODIUM CHLORIDE 0.9% FLUSH
10.0000 mL | Freq: Once | INTRAVENOUS | Status: AC
  Administered 2020-04-21: 10 mL via INTRAVENOUS
  Filled 2020-04-21: qty 10

## 2020-04-21 NOTE — Progress Notes (Signed)
See telephone note.

## 2020-04-21 NOTE — Telephone Encounter (Signed)
Telephone follow-up attempted with patient and wife. Wife was unable to hear this RD and states the phone is breaking up. I contacted daughter [name] and completed nutrition follow-up with her.  Patient is status post treatment for tonsil cancer but continues IV fluids at [facility] cancer center. Last weight documented was 202 lbs on October 15. Patient has not been eating or drinking by mouth. Patient is currently using 1-1/2 cartons of [employer] four times daily. He was directed to give 120 mL of free water before and after each bolus feeding. Patient was educated to flush 240 mL free water four times a day between feedings. However, he is using two bottles of water (17 oz each per daughter) with each tube feeding and insists family give this much at one time. He seems not to have any complaints or problems tolerating this volume.  Daughter feels patient is able to swallow and feels it is time for him to try to begin some oral intake. Patient is refusing and thinks he can continue tube feedings and is not motivated to begin oral intake. Noted swallow evaluation scheduled for tomorrow with speech therapist.  Six cartons [employer] [place] 1.4 provides 2730 cal, 120 g protein. This is 100% estimated nutrition needs.  Nutrition diagnosis: Unintentional weight loss is stable.  Intervention: If swallowing is deemed safe, begin water by mouth. Drink one carton of vanilla [employer] or Ensure Plus daily and decrease [employer] to 1-1/2 bottles three times a day via tube.  Provide written information for patient to take home after swallow evaluation.  Monitoring, evaluation, goals: Patient will tolerate oral intake so tube feeding can decrease while maintaining weight.  Next visit: Tuesday, October 26 by phone.  **Disclaimer: This note was dictated with voice recognition software. Similar sounding words can inadvertently be transcribed and this note may contain transcription errors which may not have been  corrected upon publication of note.**

## 2020-04-22 ENCOUNTER — Ambulatory Visit: Payer: 59

## 2020-04-22 ENCOUNTER — Telehealth: Payer: Self-pay | Admitting: General Practice

## 2020-04-22 NOTE — Telephone Encounter (Signed)
[facility] CSW Progress Notes  Call to daughter [name] who has accompanied him to [facility] appointments in the past.  Advised that we have a portable hearing amplifier available for todays visit - they can ask provider for this if patient desires.  Per [name], patient is not currently doing the recommended swallowing exercises -wonders if todays session exercises could be videoed for further reinforcement at home.  Conveyed the above to treatment team.  Brother will accompany patient to todays appointment.  Edwyna Shell, LCSW Clinical Social Worker Phone:  [phone]

## 2020-04-22 NOTE — Progress Notes (Signed)
Patient Care Team: Chrismon, Vickki Muff, PA as PCP - General (Family Medicine) [name], MD as Consulting Physician (Cardiothoracic Surgery) [name], MD as Consulting Physician (Cardiology) Serena Colonel, RN as [place] Management [name], MD as Consulting Physician (Hematology and Oncology) [name], MD as Attending Physician (Radiation Oncology) [name], [name], RN as Oncology Nurse Navigator [name], [name], [place] as Speech Language Pathologist (Speech Pathology) [name], DDS as Consulting Physician (Dentistry) [name], [name], PT as Physical Therapist (Physical Therapy) [name], LCSW as Social Worker (General Practice) [name], LCSW as Social Worker [name], RD as Dietitian (Nutrition)  DIAGNOSIS:    ICD-10-CM   1. 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  Two right cervical lymph nodes metastases measuring 2.5 cm and 2.1 cm   02/17/2020 -  Chemotherapy   The patient had dexamethasone (DECADRON) 4 MG tablet, 8 mg, Oral, Daily, 1 of 1 cycle, Start date: 02/02/2020, End date: -- palonosetron (ALOXI) injection 0.25 mg, 0.25 mg, Intravenous,  Once, 3 of 3 cycles Administration: 0.25 mg (02/17/2020), 0.25 mg (03/02/2020), 0.25 mg (03/23/2020) CISplatin (PLATINOL) 173 mg in sodium chloride 0.9 % 500 mL chemo infusion, 75 mg/m2 = 173 mg (100 % of original dose 75 mg/m2), Intravenous,  Once, 3 of 3 cycles Dose modification: 75 mg/m2 (original dose 75 mg/m2, Cycle 1, Reason: Provider Judgment), 60 mg/m2 (original dose 75 mg/m2, Cycle 2, Reason: Dose not tolerated) Administration: 173 mg (02/17/2020), 138 mg (03/02/2020), 138 mg (03/23/2020) fosaprepitant  (EMEND) 150 mg in sodium chloride 0.9 % 145 mL IVPB, 150 mg, Intravenous,  Once, 3 of 3 cycles Administration: 150 mg (02/17/2020), 150 mg (03/02/2020), 150 mg (03/23/2020)  for chemotherapy treatment.    02/17/2020 Cancer Staging   Staging form: Pharynx - P16 Negative Oropharynx, AJCC 8th Edition - Clinical stage from 02/17/2020: Stage IVA (cT4a, cN2b, cM0, p16+) - Signed by [name], MD on 03/02/2020   Cancer of overlapping sites of tonsil [facility])  01/27/2020 Cancer Staging   Staging form: Pharynx - HPV-Mediated Oropharynx, AJCC 8th Edition - Clinical stage from 01/27/2020: Stage III (cT4, cN1, cM0, p16+) - Signed by [name], MD on 01/31/2020   01/31/2020 Initial Diagnosis   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[place])     CHIEF COMPLIANT: Follow-up of tonsillar cancer status post chemoradiation  INTERVAL HISTORY: Omar Christian is a 64 y.o. with above-mentioned history of tonsil cancercurrently on treatment with radiation and chemotherapy with cisplatin.He presents to the clinic todayfor follow-up. He is working with dietitian to start increasing his oral intake. He is drinking more fluids through the mouth. He has a speech therapy appointment next Monday. He will start to increase semisolid and solid foods slowly over time. His weight continues to decline steadily. He has significant secretions in his mouth.   ALLERGIES:  has No Known Allergies.  MEDICATIONS:  Current Outpatient Medications  Medication Sig Dispense Refill   allopurinol (ZYLOPRIM) 100 MG tablet Take 1 tablet (100 mg total) by mouth daily. 30 tablet 12   atorvastatin (LIPITOR) 20 MG tablet Take 1 tablet (20 mg total) by mouth daily. 90 tablet 3   dexamethasone (DECADRON) 4 MG tablet Take 2 tablets (8 mg  total) by mouth daily. Take daily for 3 days after chemo. Take with food. 20 tablet 0   guaiFENesin-dextromethorphan (ROBITUSSIN DM) 100-10 MG/5ML syrup Take 5 mLs by mouth every 4 (four) hours as needed for  cough. 118 mL 0   lidocaine (XYLOCAINE) 2 % solution Patient: Mix 1part 2% viscous lidocaine, 1part H20. Swish & swallow 69mL of diluted mixture, 73min before eating and at bedtime, up to 5xs daily. 200 mL 4   lidocaine-prilocaine (EMLA) cream Apply to affected area once 30 g 3   LORazepam (ATIVAN) 0.5 MG tablet Take 1 tablet (0.5 mg total) by mouth at bedtime as needed (Nausea or vomiting). 30 tablet 0   magic mouthwash w/lidocaine SOLN Take 5 mLs by mouth 5 (five) times daily as needed for mouth pain. 240 mL 1   metoCLOPramide (REGLAN) 10 MG/10ML SOLN Take 5 mLs (5 mg total) by mouth 3 (three) times daily before meals. 1200 mL 0   metoprolol succinate (TOPROL XL) 25 MG 24 hr tablet Take 1 tablet (25 mg total) by mouth daily. 30 tablet 3   MITIGARE 0.6 MG CAPS Take 1 capsule by mouth daily.     Nutritional Supplements (KATE FARMS STANDARD 1.4) LIQD 488 mLs by Gastrostomy Tube route 4 (four) times daily. 2730 mL 6   nystatin (MYCOSTATIN) 100000 UNIT/ML suspension TAKE 5 MLS (500,000 UNITS TOTAL) BY MOUTH IN THE MORNING, AT NOON, AND AT BEDTIME. 60 mL 0   ondansetron (ZOFRAN) 8 MG tablet Take 1 tablet (8 mg total) by mouth 2 (two) times daily as needed. Start on the third day after chemotherapy. 30 tablet 1   oxyCODONE-acetaminophen (PERCOCET/ROXICET) 5-325 MG tablet Take 1 tablet by mouth every 4 (four) hours as needed.      prochlorperazine (COMPAZINE) 10 MG tablet Take 1 tablet (10 mg total) by mouth every 6 (six) hours as needed (Nausea or vomiting). 30 tablet 1   sodium fluoride (PREVIDENT 5000 PLUS) 1.1 % CREA dental cream Apply to tooth brush. Brush teeth for 2 minutes. Spit out excess-DO NOT swallow. DO NOT rinse afterwards. Repeat nightly. 51 g prn   No current facility-administered medications for this visit.    PHYSICAL EXAMINATION: ECOG PERFORMANCE STATUS: 1 - Symptomatic but completely ambulatory  Vitals:   04/23/20 0859  BP: (!) 148/80  Pulse: 100  Resp: 17  Temp:  97.7 F (36.5 C)  SpO2: 100%   Filed Weights   04/23/20 0859  Weight: 199 lb 12.8 oz (90.6 kg)    LABORATORY DATA:  I have reviewed the data as listed CMP Latest Ref Rng & Units 04/21/2020 04/14/2020 04/01/2020  Glucose 70 - 99 mg/dL 153(H) 176(H) 169(H)  BUN 8 - 23 mg/dL 28(H) 29(H) 31(H)  Creatinine 0.61 - 1.24 mg/dL 1.26(H) 1.30(H) 1.45(H)  Sodium 135 - 145 mmol/L 131(L) 131(L) 132(L)  Potassium 3.5 - 5.1 mmol/L 4.5 4.2 4.2  Chloride 98 - 111 mmol/L 95(L) 96(L) 95(L)  CO2 22 - 32 mmol/L 26 25 28   Calcium 8.9 - 10.3 mg/dL 8.8(L) 8.9 8.7(L)  Total Protein 6.5 - 8.1 g/dL 6.4(L) 6.5 6.3(L)  Total Bilirubin 0.3 - 1.2 mg/dL 0.5 0.6 0.4  Alkaline Phos 38 - 126 U/L 70 73 92  AST 15 - 41 U/L 17 19 18   ALT 0 - 44 U/L 13 16 22     Lab Results  Component Value Date   WBC 3.5 (L) 04/23/2020   HGB 7.5 (L) 04/23/2020   HCT 22.6 (L) 04/23/2020   MCV 89.0 04/23/2020  PLT 169 04/23/2020   NEUTROABS 2.8 04/23/2020    ASSESSMENT & PLAN:  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 Two right cervical lymph nodesmetastases measuring 2.5 cm and 2.1 cm T4N2BM0 stage IVa HPV status:Positive  Treatment plan:Concurrent chemoradiation with cisplatin every 3 weeks x3.  Radiation completed 04/12/2020 Patient was getting IV fluids 3 times a week at [place] [facility]. Bradycardia: Cardiology referral placed (previously he had bypass surgery)  Chemo toxicities: 1.Weight loss: on PEG tube feeds. 2.Fatigue 3.Worsening hearing impairment: Cisplatin related worsening of an already poor hearing 4.Chemotherapy-induced anemia: Monitoring. He does not have any symptoms of anemia and therefore we will watch and monitor with his hemoglobin of 7.5. 5.Dehydration:  Following with nutritionist/dietitian, we will do IV fluids once a week x4. 6.Renal insufficiency:  Due to dehydration    Lab review: WBC 3.5, hemoglobin 7.5,  RTConce a week for IV fluids I will see him back in 4 weeks.  He has appointment to see Dr. Isidore Moos. We will add IV fluids to that appointment. Dr. Isidore Moos will decide when he gets PET CT scan for restaging.      No orders of the defined types were placed in this encounter.  The patient has a good understanding of the overall plan. he agrees with it. he will call with any problems that may develop before the next visit here.  Total time spent: 30 mins including face to face time and time spent for planning, charting and coordination of care  [name], MD 04/23/2020  I, Cloyde Reams Dorshimer, am acting as scribe for Dr. [name].  I have reviewed the above documentation for accuracy and completeness, and I agree with the above.

## 2020-04-23 ENCOUNTER — Inpatient Hospital Stay: Payer: 59

## 2020-04-23 ENCOUNTER — Other Ambulatory Visit: Payer: Self-pay

## 2020-04-23 ENCOUNTER — Inpatient Hospital Stay (HOSPITAL_BASED_OUTPATIENT_CLINIC_OR_DEPARTMENT_OTHER): Payer: 59 | Admitting: Hematology and Oncology

## 2020-04-23 DIAGNOSIS — Z95828 Presence of other vascular implants and grafts: Secondary | ICD-10-CM

## 2020-04-23 DIAGNOSIS — C098 Malignant neoplasm of overlapping sites of tonsil: Secondary | ICD-10-CM

## 2020-04-23 DIAGNOSIS — Z510 Encounter for antineoplastic radiation therapy: Secondary | ICD-10-CM | POA: Diagnosis not present

## 2020-04-23 DIAGNOSIS — C099 Malignant neoplasm of tonsil, unspecified: Secondary | ICD-10-CM | POA: Diagnosis not present

## 2020-04-23 LAB — CBC WITH DIFFERENTIAL (CANCER CENTER ONLY)
Abs Immature Granulocytes: 0.02 10*3/uL (ref 0.00–0.07)
Basophils Absolute: 0 10*3/uL (ref 0.0–0.1)
Basophils Relative: 0 %
Eosinophils Absolute: 0 10*3/uL (ref 0.0–0.5)
Eosinophils Relative: 0 %
HCT: 22.6 % — ABNORMAL LOW (ref 39.0–52.0)
Hemoglobin: 7.5 g/dL — ABNORMAL LOW (ref 13.0–17.0)
Immature Granulocytes: 1 %
Lymphocytes Relative: 8 %
Lymphs Abs: 0.3 10*3/uL — ABNORMAL LOW (ref 0.7–4.0)
MCH: 29.5 pg (ref 26.0–34.0)
MCHC: 33.2 g/dL (ref 30.0–36.0)
MCV: 89 fL (ref 80.0–100.0)
Monocytes Absolute: 0.4 10*3/uL (ref 0.1–1.0)
Monocytes Relative: 11 %
Neutro Abs: 2.8 10*3/uL (ref 1.7–7.7)
Neutrophils Relative %: 80 %
Platelet Count: 169 10*3/uL (ref 150–400)
RBC: 2.54 MIL/uL — ABNORMAL LOW (ref 4.22–5.81)
RDW: 22 % — ABNORMAL HIGH (ref 11.5–15.5)
WBC Count: 3.5 10*3/uL — ABNORMAL LOW (ref 4.0–10.5)
nRBC: 0 % (ref 0.0–0.2)

## 2020-04-23 LAB — CMP (CANCER CENTER ONLY)
ALT: 10 U/L (ref 0–44)
AST: 13 U/L — ABNORMAL LOW (ref 15–41)
Albumin: 3.3 g/dL — ABNORMAL LOW (ref 3.5–5.0)
Alkaline Phosphatase: 82 U/L (ref 38–126)
Anion gap: 8 (ref 5–15)
BUN: 24 mg/dL — ABNORMAL HIGH (ref 8–23)
CO2: 29 mmol/L (ref 22–32)
Calcium: 9.9 mg/dL (ref 8.9–10.3)
Chloride: 98 mmol/L (ref 98–111)
Creatinine: 1.28 mg/dL — ABNORMAL HIGH (ref 0.61–1.24)
GFR, Estimated: >60 mL/min (ref >60)
Glucose, Bld: 139 mg/dL — ABNORMAL HIGH (ref 70–99)
Potassium: 4.4 mmol/L (ref 3.5–5.1)
Sodium: 135 mmol/L (ref 135–145)
Total Bilirubin: 0.4 mg/dL (ref 0.3–1.2)
Total Protein: 6.5 g/dL (ref 6.5–8.1)

## 2020-04-23 LAB — MAGNESIUM: Magnesium: 1.5 mg/dL — ABNORMAL LOW (ref 1.7–2.4)

## 2020-04-23 MED ORDER — HEPARIN SOD (PORK) LOCK FLUSH 100 UNIT/ML IV SOLN
500.0000 [IU] | Freq: Once | INTRAVENOUS | Status: AC
  Administered 2020-04-23: 500 [IU]
  Filled 2020-04-23: qty 5

## 2020-04-23 MED ORDER — SODIUM CHLORIDE 0.9% FLUSH
10.0000 mL | Freq: Once | INTRAVENOUS | Status: AC
  Administered 2020-04-23: 10 mL
  Filled 2020-04-23: qty 10

## 2020-04-23 NOTE — Progress Notes (Signed)
Patient was left assessed and went to see the doctor, he wasn't scheduled for treatment and I de-cessed him. He never left [facility].

## 2020-04-23 NOTE — Assessment & Plan Note (Signed)
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 Two right cervical lymph nodesmetastases measuring 2.5 cm and 2.1 cm T4N2BM0 stage IVa HPV status:Positive  Treatment plan:Concurrent chemoradiation with cisplatin every 3 weeks x3.  Radiation completed 04/12/2020 Patient was getting IV fluids 3 times a week at [facility]. Bradycardia: Cardiology referral placed (previously he had bypass surgery)  Chemo toxicities: 1.Weight loss: on PEG tube feeds. 2.Fatigue 3.Worsening hearing impairment: Cisplatin related worsening of an already poor hearing 4.Nausea 5.Dehydration: He is getting IV fluids 3 times a week.Following with nutritionist/dietitian  6.Renal insufficiency: Due to dehydration 7.Thrombocytopenia    RTConce a week for IV fluids I will see him back in 4 weeks. Labs will be done weekly.

## 2020-04-23 NOTE — Addendum Note (Signed)
Addended by: Domingo Madeira on: 04/23/2020 09:55 AM   Modules accepted: Orders

## 2020-04-23 NOTE — Patient Instructions (Signed)

## 2020-04-24 ENCOUNTER — Ambulatory Visit: Payer: 59

## 2020-04-26 ENCOUNTER — Other Ambulatory Visit: Payer: Self-pay

## 2020-04-26 ENCOUNTER — Ambulatory Visit: Payer: 59 | Attending: Radiation Oncology

## 2020-04-26 DIAGNOSIS — R1313 Dysphagia, pharyngeal phase: Secondary | ICD-10-CM | POA: Diagnosis present

## 2020-04-26 DIAGNOSIS — R471 Dysarthria and anarthria: Secondary | ICD-10-CM | POA: Insufficient documentation

## 2020-04-26 DIAGNOSIS — R41841 Cognitive communication deficit: Secondary | ICD-10-CM | POA: Diagnosis present

## 2020-04-26 NOTE — Therapy (Signed)
[facility] [address] [place], [place], [address] Phone: [phone]   Fax:  [phone]  Speech Language Pathology Treatment  Patient Details  Name: Omar Christian MRN: [id number] Date of Birth: 04-Jun-1956 Referring Provider (SLP): [name], MD   Encounter Date: 04/26/2020   End of Session - 04/26/20 1519    Visit Number 3    Number of Visits 7    Date for SLP Re-Evaluation 05/19/20    SLP Start Time 1320    SLP Stop Time  1400    SLP Time Calculation (min) 40 min    Activity Tolerance Patient tolerated treatment well   Pt requested SLP repeat x3 and then SLP insured pt heard SLP          Past Medical History:  Diagnosis Date   Abnormal findings on cardiac catheterization 09/30/15   [facility]   Abnormal myocardial perfusion study 09/24/15   [place]   CAD, multiple vessel 09/30/15   per CATH @ [facility]/DR.PARACHOS   Coronary artery disease    GERD (gastroesophageal reflux disease)    Gout    H/O echocardiogram 09/24/15   [facility]   Hard of hearing    History of pneumonia    Hyperlipidemia    Hypertension    Pneumonia    Progressive angina ([place])    Tonsil cancer ([place])    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    Past Surgical History:  Procedure Laterality Date   CARDIAC CATHETERIZATION N/A 09/30/2015   Procedure: Left Heart Cath and Coronary Angiography;  Surgeon: [name], MD;  Location: [facility];  Service: Cardiovascular;  Laterality: N/A;   CORONARY ARTERY BYPASS GRAFT N/A 10/08/2015   Procedure: CORONARY ARTERY BYPASS GRAFTING time 4 using left internal mammary and right greater saphenous vein harvested by endovein;  Surgeon: [name], MD;  Location: [facility];  Service: Open Heart Surgery;  Laterality: N/A;   CORONARY ARTERY BYPASS GRAFT     IR GASTROSTOMY TUBE MOD SED  02/10/2020   IR IMAGING GUIDED PORT INSERTION  02/10/2020   MULTIPLE  EXTRACTIONS WITH ALVEOLOPLASTY N/A 02/04/2020   Procedure: Extraction of tooth #'s [phone], and 32 with alveoloplasty and gross debridement of remaining teeth.;  Surgeon: [name], DDS;  Location: [facility];  Service: Oral Surgery;  Laterality: N/A;   TEE WITHOUT CARDIOVERSION N/A 10/08/2015   Procedure: TRANSESOPHAGEAL ECHOCARDIOGRAM (TEE);  Surgeon: [name], MD;  Location: [facility];  Service: Open Heart Surgery;  Laterality: N/A;   VASECTOMY     WISDOM TOOTH EXTRACTION      There were no vitals filed for this visit.   Subjective Assessment - 04/26/20 1321    Subjective SLP using loud voice with pt, as hearing has decr'd since radiation, per family.    Patient is accompained by: Family member   son - in - law   Currently in Pain? No/denies                 ADULT SLP TREATMENT - 04/26/20 1322      General Information   Behavior/Cognition Alert;Hard of hearing;Other (comment)   flat affect     Treatment Provided   Treatment provided Dysphagia      Dysphagia Treatment   Treatment Methods Skilled observation;Compensation strategy training;Patient/caregiver education    Patient observed directly with PO's Yes    Type of PO's observed Thin liquids;Dysphagia 1 (puree)    Feeding Needs assist  Oral Phase Signs & Symptoms --   none noted today   Pharyngeal Phase Signs & Symptoms Delayed cough   but none with effortful and dry swallow; gags some with PO   Other treatment/comments Pt told SLP rationale for HEP independently after 25 minutes. Pt expectorating into tissues. SLP told pt to attempt to swallow phlegm twice, and then expectorate if still won't clear pharynx. "It's like I got a sinus infection - like my throat is full." SLP strongly encouraged pt to have some POs x3/day and said it should be dys I-II items with thin liquids - at least a small portion to start with - pt should use hard swallow with initial swallow and then dry swallow after that., for each  bite/sip. Last 10 mintues of session x2 pt had to swallow his saliva - pt unaware each time. SLP educated pt and son in law for pt to do this throughout the day as well. SLP re-worked pt's HEP to allow for pt inability to do [place]. SLP described [place] for pt and son in law, and all others were described and imitated and pt completed independently (except [place]). SLP suggested pt take meds with applesauce or puree, through tube via appropriate measures as a last resort.       Pain Assessment   Pain Assessment No/denies pain      Assessment / Recommendations / Plan   Plan Continue with current plan of care      Dysphagia Recommendations   Diet recommendations Dysphagia 1 (puree);Dysphagia 2 (fine chop);Thin liquid    Liquids provided via Cup    Medication Administration Whole meds with puree    Supervision Intermittent supervision to cue for compensatory strategies    Compensations Effortful swallow;Multiple dry swallows after each bite/sip   Hard swallow first - then dry swallow     Progression Toward Goals   Progression toward goals Progressing toward goals            SLP Education - 04/26/20 1517    Education Details HEP procedure, hard swallow and then dry swallow wth all POs, eat small portion dys I-II with thin liquids to start back to PO diet, PEG is temporary    Person(s) Educated Patient;Caregiver(s)    Methods Explanation;Demonstration;Verbal cues;Handout    Comprehension Verbalized understanding;Returned demonstration;Verbal cues required;Need further instruction            SLP Short Term Goals - 04/26/20 [place] #1   Title pt will complete HEP with usual min A    Period --   visits, for all STGs   Status Partially Met      SLP SHORT TERM GOAL #2   Title pt will tell SLP why pt is completing HEP with min A, within 5 minutes of being told initially - x2 sessions    Baseline 04-26-20    Status Partially Met      SLP SHORT TERM GOAL  #3   Title pt/family will describe 3 overt s/s aspiration PNA with modified independence    Status Not Met   moved to [place] #4   Title pt/family will tell SLP how a food journal could hasten return to a more normalized diet    Time 1    Status On-going            SLP Long Term Goals - 04/26/20 1527      SLP LONG  TERM GOAL #1   Title pt will complete HEP with occasional min A from family x2 sessions    Time 3    Period --   sessions, for all LTGs   Status On-going      SLP LONG TERM GOAL #2   Title pt/family will describe how to modify HEP over time, and the timeline associated with reduction in HEP frequency with modified independence over two sessions    Time 5    Status On-going      SLP LONG TERM GOAL #3   Title pt will demonstrate adherance to any swallowing safety precautions from an objective swallow assessment with occasional min A from family/SLP    Time 2    Status On-going      SLP LONG TERM GOAL #4   Title pt or family will tell SLP of 3 overt s/sx aspiration PNA    Time 3    Status New            Plan - 04/26/20 1522    Clinical Impression Statement Pt's POs looked much better than previous appointment in September. Dys I-II/thin recommended with slow rate, small sips/bites, effortful swallow and dry swallow for all bites. SLP ensured pt understood all procedure for HEP. Pt may require family assistance for procedure - SLP invited pt/family to contact SLP with any questions. SLP re-designed pt's individualized HEP for dysphagia today  - see Other comments" for more details.  Data indicate that pt's swallow ability will likely decrease over the course of radiation therapy and could very well decline over time following conclusion of their radiation therapy due to muscle disuse atrophy and/or muscle fibrosis. Pt will cont to need to be seen by SLP in order to assess safety of PO intake, assess the need for recommending any objective swallow  assessment, and ensuring pt correctly completes the individualized HEP.    Speech Therapy Frequency --   once, approx every 4 weeks   Duration --   7 total sessions   Treatment/Interventions Aspiration precaution training;Pharyngeal strengthening exercises;Diet toleration management by SLP;Trials of upgraded texture/liquids;Compensatory techniques;Patient/family education;SLP instruction and feedback;Multimodal communcation approach;Cognitive reorganization    Potential to Achieve Goals Fair    Potential Considerations Ability to learn/carryover information;Previous level of function    SLP Home Exercise Plan provided today    Consulted and Agree with Plan of Care Patient           Patient will benefit from skilled therapeutic intervention in order to improve the following deficits and impairments:   Dysphagia, pharyngeal phase  Cognitive communication deficit  Dysarthria and anarthria    Problem List Patient Active Problem List   Diagnosis Date Noted   Port-A-Cath in place 04/01/2020   Fever    Tachycardia    Severe sepsis ([place]) 03/04/2020   Patient on antineoplastic chemotherapy regimen    Goals of care, counseling/discussion    Palliative care by specialist    DNR (do not resuscitate) discussion    Sepsis ([place]) 03/02/2020   Cancer of tonsil ([place]) 02/04/2020   Poor dentition 02/04/2020   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[place]) 01/31/2020   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   Unstable angina pectoris ([place]) 10/04/2015   Progressive angina ([facility])    Abnormal findings on cardiac catheterization 09/30/2015   CAD, multiple vessel 09/30/2015   Abnormal findings diagnostic imaging of heart and coronary circulation 09/30/2015   CAD in native artery 09/30/2015  H/O echocardiogram 09/24/2015   Abnormal myocardial perfusion study 09/24/2015   Chest pain 09/03/2015   Allergic rhinitis 08/27/2015   H/O: gout 08/27/2015    HLD (hyperlipidemia) 08/27/2015   Benign hypertension 08/27/2015   Acid reflux 08/27/2015   Essential (primary) hypertension 08/27/2015   H/O disease 08/27/2015    [facility] ,MS, CCC-SLP  04/26/2020, 3:28 PM  [facility] [address] [place], [place], [address] Phone: [phone]   Fax:  [phone]   Name: Omar Christian MRN: [id number] Date of Birth: 1956/05/24

## 2020-04-26 NOTE — Patient Instructions (Addendum)
SWALLOWING EXERCISES Do these until 6 months after your last day of radiation, then 2-3 times per week afterwards  1. Effortful Swallows  - SWALLOW HARD EXERCISE - Press your tongue against the roof of your mouth for 3 seconds, then   squeeze the muscles in your neck while you swallow your saliva or a sip of water - Repeat 10-15 times, 2-3 times a day, and use whenever you eat or drink  2. [place] Swallow - TONGUE OUT AND SWALLOW EXERCISE - Stick tongue out past your teeth and gently bite tongue with your teeth - Swallow, while holding your tongue with your teeth - Repeat 10-15 times, 2-3 times a day *use a wet spoon if your mouth gets dry*  3. Pitch Raise - Say "he", in the highest note you can - squeeky! - Repeat 20 times, 2-3 times a day  4. Shaker Exercise - head lift - Lie flat on your back in your bed or on a couch without pillows - Raise your head and look at your feet - KEEP YOUR SHOULDERS DOWN - HOLD FOR 45-60 SECONDS, then lower your head back down - Repeat 3 times, 2-3 times a day  5. Chin pushback - Open your mouth  - Place your fist UNDER your chin near your neck - Tuck your chin and push back with your fist for 5 seconds - Repeat 10 times, 2-3 times a day

## 2020-04-27 ENCOUNTER — Inpatient Hospital Stay: Payer: 59 | Admitting: Nutrition

## 2020-04-27 NOTE — Progress Notes (Signed)
Contacted patient's daughter [name] by phone. All follow up calls were arranged to be with her.  Wt decreased to 199.8 pounds on Oct 22, decreased from 202 pounds on October 15. Patient is refusing family's encouragement to eat and drink by mouth. Reports mouth pain and thick saliva continue. He is tolerating TF without difficulty but [name] was not able to provide details. (She has worked 12 hour days and hasn't been to the house.)  6 cartons [employer] 1.4 provides 2730 kcal, 120 gm protein and provides 100% estimated needs.  Nutrition Diagnosis: Unintended wt loss continues.  Intervention: Encourage water, ice chips and other liquids by mouth. Continue [employer] 1[employer]4, 6 cartons, to provide 100% estimated needs.  Monitoring, Evaluation, Goals: Patient will increase oral intake so TF can be weaned. Strive for wt maintenance and adequate healing.  Next Visit: Tuesday, Nov 30, by telephone with Jennet Maduro

## 2020-04-30 ENCOUNTER — Inpatient Hospital Stay: Payer: 59

## 2020-04-30 ENCOUNTER — Other Ambulatory Visit: Payer: Self-pay

## 2020-04-30 VITALS — BP 135/78 | HR 107 | Temp 99.0°F | Resp 16

## 2020-04-30 DIAGNOSIS — Z95828 Presence of other vascular implants and grafts: Secondary | ICD-10-CM

## 2020-04-30 DIAGNOSIS — C099 Malignant neoplasm of tonsil, unspecified: Secondary | ICD-10-CM

## 2020-04-30 DIAGNOSIS — Z510 Encounter for antineoplastic radiation therapy: Secondary | ICD-10-CM | POA: Diagnosis not present

## 2020-04-30 MED ORDER — SODIUM CHLORIDE 0.9 % IV SOLN
INTRAVENOUS | Status: DC
  Filled 2020-04-30: qty 250

## 2020-04-30 MED ORDER — SODIUM CHLORIDE 0.9% FLUSH
10.0000 mL | Freq: Once | INTRAVENOUS | Status: AC
  Administered 2020-04-30: 10 mL
  Filled 2020-04-30: qty 10

## 2020-04-30 MED ORDER — SODIUM CHLORIDE 0.9 % IV SOLN
Freq: Once | INTRAVENOUS | Status: AC
  Filled 2020-04-30: qty 250

## 2020-04-30 MED ORDER — MAGNESIUM SULFATE 2 GM/50ML IV SOLN
2.0000 g | Freq: Once | INTRAVENOUS | Status: AC
  Administered 2020-04-30: 2 g via INTRAVENOUS

## 2020-04-30 MED ORDER — POTASSIUM CHLORIDE 10 MEQ/100ML IV SOLN
10.0000 meq | Freq: Once | INTRAVENOUS | Status: AC
  Administered 2020-04-30: 10 meq via INTRAVENOUS

## 2020-04-30 MED ORDER — HEPARIN SOD (PORK) LOCK FLUSH 100 UNIT/ML IV SOLN
500.0000 [IU] | Freq: Once | INTRAVENOUS | Status: AC
  Administered 2020-04-30: 500 [IU]
  Filled 2020-04-30: qty 5

## 2020-04-30 MED ORDER — POTASSIUM CHLORIDE 10 MEQ/100ML IV SOLN
INTRAVENOUS | Status: AC
  Filled 2020-04-30: qty 100

## 2020-04-30 MED ORDER — MAGNESIUM SULFATE 2 GM/50ML IV SOLN
INTRAVENOUS | Status: AC
  Filled 2020-04-30: qty 50

## 2020-04-30 NOTE — Patient Instructions (Signed)

## 2020-05-04 ENCOUNTER — Telehealth: Payer: Self-pay | Admitting: *Deleted

## 2020-05-04 NOTE — Telephone Encounter (Signed)
CALLED PATIENT'S DAUGHTER [name] TATE TO ASK ABOUT COMING IN @ 11:20 AM ON 05-05-20 SPOKE WITH PATIENT'S DAUGHTER AND SHE AGREED TO THIS DATE AND TIME

## 2020-05-04 NOTE — Chronic Care Management (AMB) (Signed)
  Care Management   Outreach Note  05/04/2020 Name: Oseias Horsey MRN: [id number] DOB: 08-19-1955  Zygmund Passero is a 64 y.o. year old male who is a primary care patient of Chrismon, Vickki Muff, [place]. I reached out to [name] by phone today in response to a referral sent by Mr. [employer] health plan.    An unsuccessful telephone outreach was attempted today. The patient was referred to the case management team for assistance with care management and care coordination.   Follow Up Plan: The care management team will reach out to the patient again over the next 7 days.  If patient returns call to provider office, please advise to call [place] at [phone].  [place] Management

## 2020-05-05 ENCOUNTER — Other Ambulatory Visit: Payer: Self-pay

## 2020-05-05 ENCOUNTER — Inpatient Hospital Stay: Payer: 59 | Attending: Hematology and Oncology

## 2020-05-05 ENCOUNTER — Ambulatory Visit
Admission: RE | Admit: 2020-05-05 | Discharge: 2020-05-05 | Disposition: A | Discharge status: Home / Self Care | Payer: 59 | Source: Ambulatory Visit | Attending: Radiation Oncology | Admitting: Radiation Oncology

## 2020-05-05 ENCOUNTER — Inpatient Hospital Stay: Payer: 59

## 2020-05-05 ENCOUNTER — Encounter: Payer: Self-pay | Admitting: Radiation Oncology

## 2020-05-05 VITALS — BP 131/67 | HR 86 | Resp 18

## 2020-05-05 VITALS — BP 132/83 | HR 105 | Temp 98.2°F | Resp 20 | Ht 72.0 in | Wt 201.6 lb

## 2020-05-05 DIAGNOSIS — C099 Malignant neoplasm of tonsil, unspecified: Secondary | ICD-10-CM | POA: Diagnosis not present

## 2020-05-05 DIAGNOSIS — Z79899 Other long term (current) drug therapy: Secondary | ICD-10-CM | POA: Insufficient documentation

## 2020-05-05 DIAGNOSIS — R609 Edema, unspecified: Secondary | ICD-10-CM | POA: Insufficient documentation

## 2020-05-05 DIAGNOSIS — Z95828 Presence of other vascular implants and grafts: Secondary | ICD-10-CM

## 2020-05-05 DIAGNOSIS — C098 Malignant neoplasm of overlapping sites of tonsil: Secondary | ICD-10-CM | POA: Insufficient documentation

## 2020-05-05 DIAGNOSIS — E860 Dehydration: Secondary | ICD-10-CM | POA: Insufficient documentation

## 2020-05-05 DIAGNOSIS — Z923 Personal history of irradiation: Secondary | ICD-10-CM | POA: Insufficient documentation

## 2020-05-05 DIAGNOSIS — Z931 Gastrostomy status: Secondary | ICD-10-CM | POA: Insufficient documentation

## 2020-05-05 LAB — CMP (CANCER CENTER ONLY)
ALT: 11 U/L (ref 0–44)
AST: 14 U/L — ABNORMAL LOW (ref 15–41)
Albumin: 3.3 g/dL — ABNORMAL LOW (ref 3.5–5.0)
Alkaline Phosphatase: 64 U/L (ref 38–126)
Anion gap: 7 (ref 5–15)
BUN: 22 mg/dL (ref 8–23)
CO2: 28 mmol/L (ref 22–32)
Calcium: 8.7 mg/dL — ABNORMAL LOW (ref 8.9–10.3)
Chloride: 99 mmol/L (ref 98–111)
Creatinine: 1.23 mg/dL (ref 0.61–1.24)
GFR, Estimated: >60 mL/min (ref >60)
Glucose, Bld: 100 mg/dL — ABNORMAL HIGH (ref 70–99)
Potassium: 4.2 mmol/L (ref 3.5–5.1)
Sodium: 134 mmol/L — ABNORMAL LOW (ref 135–145)
Total Bilirubin: 0.4 mg/dL (ref 0.3–1.2)
Total Protein: 6 g/dL — ABNORMAL LOW (ref 6.5–8.1)

## 2020-05-05 LAB — CBC WITH DIFFERENTIAL (CANCER CENTER ONLY)
Abs Immature Granulocytes: 0.01 10*3/uL (ref 0.00–0.07)
Basophils Absolute: 0 10*3/uL (ref 0.0–0.1)
Basophils Relative: 0 %
Eosinophils Absolute: 0 10*3/uL (ref 0.0–0.5)
Eosinophils Relative: 1 %
HCT: 21.5 % — ABNORMAL LOW (ref 39.0–52.0)
Hemoglobin: 7.4 g/dL — ABNORMAL LOW (ref 13.0–17.0)
Immature Granulocytes: 0 %
Lymphocytes Relative: 8 %
Lymphs Abs: 0.3 10*3/uL — ABNORMAL LOW (ref 0.7–4.0)
MCH: 31.5 pg (ref 26.0–34.0)
MCHC: 34.4 g/dL (ref 30.0–36.0)
MCV: 91.5 fL (ref 80.0–100.0)
Monocytes Absolute: 0.4 10*3/uL (ref 0.1–1.0)
Monocytes Relative: 11 %
Neutro Abs: 3.1 10*3/uL (ref 1.7–7.7)
Neutrophils Relative %: 80 %
Platelet Count: 134 10*3/uL — ABNORMAL LOW (ref 150–400)
RBC: 2.35 MIL/uL — ABNORMAL LOW (ref 4.22–5.81)
RDW: 20.5 % — ABNORMAL HIGH (ref 11.5–15.5)
WBC Count: 3.9 10*3/uL — ABNORMAL LOW (ref 4.0–10.5)
nRBC: 0 % (ref 0.0–0.2)

## 2020-05-05 LAB — MAGNESIUM: Magnesium: 1.6 mg/dL — ABNORMAL LOW (ref 1.7–2.4)

## 2020-05-05 MED ORDER — MAGNESIUM SULFATE 2 GM/50ML IV SOLN: 2.0000 g | Freq: Once | INTRAVENOUS | Status: DC

## 2020-05-05 MED ORDER — SODIUM CHLORIDE 0.9% FLUSH
10.0000 mL | Freq: Once | INTRAVENOUS | Status: AC
  Administered 2020-05-05: 10 mL
  Filled 2020-05-05: qty 10

## 2020-05-05 MED ORDER — SODIUM CHLORIDE 0.9 % IV SOLN
Freq: Once | INTRAVENOUS | Status: AC
  Filled 2020-05-05: qty 250

## 2020-05-05 MED ORDER — SCOPOLAMINE 1 MG/3DAYS TD PT72: 1.0000 | MEDICATED_PATCH | TRANSDERMAL | 2 refills | Status: DC

## 2020-05-05 MED ORDER — POTASSIUM CHLORIDE 10 MEQ/100ML IV SOLN
INTRAVENOUS | Status: AC
  Filled 2020-05-05: qty 100

## 2020-05-05 MED ORDER — POTASSIUM CHLORIDE 10 MEQ/100ML IV SOLN: 10.0000 meq | Freq: Once | INTRAVENOUS | Status: DC

## 2020-05-05 MED ORDER — MAGNESIUM SULFATE 2 GM/50ML IV SOLN
INTRAVENOUS | Status: AC
  Filled 2020-05-05: qty 50

## 2020-05-05 MED ORDER — HEPARIN SOD (PORK) LOCK FLUSH 100 UNIT/ML IV SOLN
500.0000 [IU] | Freq: Once | INTRAVENOUS | Status: AC
  Administered 2020-05-05: 500 [IU]
  Filled 2020-05-05: qty 5

## 2020-05-05 MED ORDER — GUAIFENESIN 100 MG/5ML PO LIQD: 400.0000 mg | Freq: Three times a day (TID) | ORAL | 3 refills | Status: DC | PRN

## 2020-05-05 NOTE — Progress Notes (Signed)
Verbal order with read back from Dr. Lindi Adie: give patient 1 Liter NS IVF over an Hour. Hold Mag and Potassium infusion today. Draw CMP and [place] labs.

## 2020-05-05 NOTE — Progress Notes (Signed)
Omar Christian presents today for 2 week follow-up after completing radiation to his head/neck and right tonsil on 04/12/2020  Pain issues, if any: patient denies any pain currently. Reports when he does have discomfort, its located more in his mouth/tongue region  Using a feeding tube?: Yes. Daughter reports he does 3-4 feedings a day; 04/27/2020 Bard Neff-RD had phone visit with daughter: "Patient is refusing family's encouragement to eat and drink by mouth. Reports mouth pain and thick saliva continue. He is tolerating TF without difficulty. Intervention: Encourage water, ice chips and other liquids by mouth. Continue [employer] 1[employer]4, 6 cartons, to provide 100% estimated needs." Weight changes, if any:  Wt Readings from Last 3 Encounters:  05/05/20 201 lb 9.6 oz (91.4 kg)  04/23/20 199 lb 12.8 oz (90.6 kg)  04/16/20 202 lb (91.6 kg)   Swallowing issues, if any: Yes. Unable to tolerate more than a few sips of liquid throughout the day. 04/26/2020 Saw Omar Christian-SLP: "Pt's POs looked much better than previous appointment in September. Dys I-II/thin recommended with slow rate, small sips/bites, effortful swallow and dry swallow for all bites. SLP ensured pt understood all procedure for HEP. Pt may require family assistance for procedure - SLP invited pt/family to contact SLP with any questions." Smoking or chewing tobacco? None Using fluoride trays daily? Uses fluoride toothpaste Last ENT visit was on: Not since diagnosis Other notable issues, if any: Patient's skin looks well healed in treatment site. Reports he's sleeping better but has to get up frequently to urinate (which he reports wasn't a issue before cancer treatment). Reports mild lymphedema to neck, but is not interested in PT.  Vitals:   05/05/20 1117  BP: 132/83  Pulse: (!) 105  Resp: 20  Temp: 98.2 F (36.8 C)  SpO2: 100%

## 2020-05-05 NOTE — Patient Instructions (Signed)

## 2020-05-05 NOTE — Progress Notes (Signed)
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   Omar Christian presents today for 2 week follow-up after completing radiation to his head/neck and right tonsil on 04/12/2020  Pain issues, if any: patient denies any pain currently. Reports when he does have discomfort, its located more in his mouth/tongue region  Using a feeding tube?: Yes. Omar Christian reports he does 3-4 feedings a day; 04/27/2020 Omar Christian had phone visit with Omar Christian: "Patient is refusing family's encouragement to eat and drink by mouth. Reports mouth pain and thick saliva continue. He is tolerating TF without difficulty. Intervention: Encourage water, ice chips and other liquids by mouth. Continue Omar Christian 1.4, 6 cartons, to provide 100% estimated needs." Weight changes, if any:  Wt Readings from Last 3 Encounters:  05/05/20 201 lb 9.6 oz (91.4 kg)  04/23/20 199 lb 12.8 oz (90.6 kg)  04/16/20 202 lb (91.6 kg)   Swallowing issues, if any: Yes. Unable to tolerate more than a few sips of liquid throughout the day. 04/26/2020 Saw Omar Christian: "Pt's POs looked much better than previous appointment in September. Dys I-II/thin recommended with slow rate, small sips/bites, effortful swallow and dry swallow for all bites. SLP ensured pt understood all procedure for HEP. Pt may require family assistance for procedure - SLP invited pt/family to contact SLP with any questions." Smoking or chewing tobacco?  None Using fluoride trays daily? Uses fluoride toothpaste Last ENT visit was on: Not since diagnosis Other notable issues, if any: Patient's skin looks well healed in treatment site. Reports he's sleeping better but has to get up frequently to urinate (which he reports wasn't a issue before cancer treatment). Reports mild lymphedema to neck, but is not interested in PT.  Vitals:   05/05/20 1117  BP: 132/83  Pulse: (!) 105  Resp: 20  Temp: 98.2 F (36.8 C)  SpO2: 100%                      ALLERGIES:  has No Known Allergies.  Meds: Current Outpatient Medications  Medication Sig Dispense Refill  . allopurinol (ZYLOPRIM) 100 MG tablet Take 1 tablet (100 mg total) by mouth daily. 30 tablet 12  . atorvastatin (LIPITOR) 20 MG tablet Take 1 tablet (20 mg total) by mouth daily. 90 tablet 3  . dexamethasone (DECADRON) 4 MG tablet Take 2 tablets (8 mg total) by mouth daily. Take daily for 3 days after chemo. Take with food. 20 tablet 0  . guaiFENesin (ROBITUSSIN) 100 MG/5ML liquid Take 20 mLs (400 mg total) by mouth 3 (three) times daily as needed for cough. May loosen secretions. 1000 mL 3  . lidocaine (XYLOCAINE) 2 % solution Patient: Mix 1part 2% viscous lidocaine, 1part H20. Swish & swallow 37mL of diluted mixture, 19min before eating and at bedtime, up to 5xs daily. 200 mL 4  . lidocaine-prilocaine (EMLA) cream Apply to affected area once 30 g 3  .  LORazepam (ATIVAN) 0.5 MG tablet Take 1 tablet (0.5 mg total) by mouth at bedtime as needed (Nausea or vomiting). 30 tablet 0  . magic mouthwash w/lidocaine SOLN Take 5 mLs by mouth 5 (five) times daily as needed for mouth pain. 240 mL 1  . metoCLOPramide (REGLAN) 10 MG/10ML SOLN Take 5 mLs (5 mg total) by mouth 3 (three) times daily before meals. 1200 mL 0  . metoprolol succinate (TOPROL XL) 25 MG 24 hr tablet Take 1 tablet (25 mg total) by mouth daily. 30 tablet 3  . MITIGARE 0.6 MG CAPS Take 1 capsule by mouth daily.    . Nutritional Supplements  (K[employer]ARMS STANDARD 1.4) LIQD 488 mLs by Gastrostomy Tube route 4 (four) times daily. 2730 mL 6  . nystatin (MYCOSTATIN) 100000 UNIT/ML suspension TAKE 5 MLS (500,000 UNITS TOTAL) BY MOUTH IN THE MORNING, AT NOON, AND AT BEDTIME. 60 mL 0  . ondansetron (ZOFRAN) 8 MG tablet Take 1 tablet (8 mg total) by mouth 2 (two) times daily as needed. Start on the third day after chemotherapy. 30 tablet 1  . oxyCODONE-acetaminophen (PERCOCET/ROXICET) 5-325 MG tablet Take 1 tablet by mouth every 4 (four) hours as needed.     . prochlorperazine (COMPAZINE) 10 MG tablet Take 1 tablet (10 mg total) by mouth every 6 (six) hours as needed (Nausea or vomiting). 30 tablet 1  . scopolamine (TRANSDERM-SCOP) 1 MG/3DAYS Place 1 patch (1.5 mg total) onto the skin every 3 (three) days. 10 patch 2  . sodium fluoride (PREVIDENT 5000 PLUS) 1.1 % CREA dental cream Apply to tooth brush. Brush teeth for 2 minutes. Spit out excess-DO NOT swallow. DO NOT rinse afterwards. Repeat nightly. 51 g prn   No current facility-administered medications for this encounter.    Physical Findings: The patient is in no acute distress. Patient is alert and oriented. Wt Readings from Last 3 Encounters:  05/05/20 201 lb 9.6 oz (91.4 kg)  04/23/20 199 lb 12.8 oz (90.6 kg)  04/16/20 202 lb (91.6 kg)    height is 6' (1.829 m) and weight is 201 lb 9.6 oz (91.4 kg). His temperature is 98.2 F (36.8 C). His blood pressure is 132/83 and his pulse is 105 (abnormal). His respiration is 20 and oxygen saturation is 100%. .  General: Alert and oriented, in no acute distress HEENT: Head is normocephalic. Extraocular movements are intact. Oropharynx is notable for resolving mucositis, no thrush.  There is thick saliva.  He is hard of hearing Neck: Neck is notable for no palpable adenopathy.  He has anterior lymphedema Skin: Skin in treatment fields shows satisfactory healing    Lab Findings: Lab Results  Component Value Date   WBC 3.9 (L) 05/05/2020    HGB 7.4 (L) 05/05/2020   HCT 21.5 (L) 05/05/2020   MCV 91.5 05/05/2020   PLT 134 (L) 05/05/2020    Lab Results  Component Value Date   TSH 1.930 09/19/2019    Radiographic Findings: No results found.  Impression/Plan:    1) Head and Neck Cancer Status: Healing well from chemoradiation  2) Nutritional Status: He is maintaining his weight and I applauded him on this.  I encouraged him to supplement his PEG feedings with oral intake as tolerated PEG tube: Using for vast majority of his nutrition  3) Risk Factors: The patient has been educated about risk factors including alcohol and tobacco abuse; they understand that avoidance of alcohol and tobacco is important to prevent recurrences as well as other cancers  4) Swallowing: I encouraged him to continue swallowing exercises.  He states he cannot do this because of thick secretions.  I prescribed guaifenesin and scopolamine and instructed the patient and his Omar Christian on how to use these.  She will monitor her father and stop the scopolamine if he has side effects.  5) Dental: Encouraged to continue regular followup with dentistry, and dental hygiene including fluoride rinses.   6) Thyroid function: Check annually Lab Results  Component Value Date   TSH 1.930 09/19/2019    7) Other: He has some anterior lymphedema and I encouraged him to see physical therapy for this.  He is agreeable and this will be arranged by our navigator  8) Follow-up in second half of January with restaging PET scan. The patient was encouraged to call with any issues or questions before then.  On date of service, in total, I spent 25 minutes on this encounter. Patient was seen in person. _____________________________________   [name], MD

## 2020-05-05 NOTE — Progress Notes (Signed)
Oncology Nurse Navigator Documentation  I met with Omar Christian and his daughter [name] during his follow up with Dr. Isidore Moos today. I have placed orders for his post-treatment scan and Physical Therapy at the request of Dr. Isidore Moos. He will receive IVF today in medical oncology. They know to call me if they have any future needs or concerns.   Harlow Asa RN, BSN, OCN Head & Neck Oncology Nurse [place] at [facility] Phone # [phone]  Fax # [phone]

## 2020-05-07 ENCOUNTER — Ambulatory Visit: Payer: 59

## 2020-05-10 NOTE — Chronic Care Management (AMB) (Signed)
  Care Management   Outreach Note  05/10/2020 Name: Omar Christian MRN: [id number] DOB: [birth year]  Omar Christian is a 64 y.o. year old male who is a primary care patient of Omar Christian, Omar Christian, Omar Christian. I reached out to Omar Christian by phone today in response to a referral sent by Mr. [employer] health plan.     A second unsuccessful telephone outreach was attempted today. The patient was referred to the case management team for assistance with care management and care coordination.   Follow Up Plan: The care management team will reach out to the patient again over the next 7 days.  If patient returns call to provider office, please advise to call [place] at [phone].  [facility] Management  Direct Dial: [phone]

## 2020-05-11 ENCOUNTER — Ambulatory Visit: Payer: Self-pay | Admitting: *Deleted

## 2020-05-12 ENCOUNTER — Inpatient Hospital Stay: Payer: 59

## 2020-05-12 ENCOUNTER — Other Ambulatory Visit: Payer: Self-pay

## 2020-05-12 ENCOUNTER — Other Ambulatory Visit: Payer: Self-pay | Admitting: *Deleted

## 2020-05-12 VITALS — BP 143/69 | HR 86 | Temp 99.1°F | Resp 18

## 2020-05-12 DIAGNOSIS — C099 Malignant neoplasm of tonsil, unspecified: Secondary | ICD-10-CM | POA: Diagnosis not present

## 2020-05-12 DIAGNOSIS — Z95828 Presence of other vascular implants and grafts: Secondary | ICD-10-CM

## 2020-05-12 MED ORDER — SODIUM CHLORIDE 0.9 % IV SOLN
Freq: Once | INTRAVENOUS | Status: AC
  Filled 2020-05-12: qty 250

## 2020-05-12 MED ORDER — SODIUM CHLORIDE 0.9% FLUSH
10.0000 mL | Freq: Once | INTRAVENOUS | Status: AC
  Administered 2020-05-12: 10 mL
  Filled 2020-05-12: qty 10

## 2020-05-12 MED ORDER — HEPARIN SOD (PORK) LOCK FLUSH 100 UNIT/ML IV SOLN
500.0000 [IU] | Freq: Once | INTRAVENOUS | Status: AC
  Administered 2020-05-12: 500 [IU]
  Filled 2020-05-12: qty 5

## 2020-05-12 NOTE — Progress Notes (Signed)
Per Roney Mans, RN per MD Lindi Adie, only NS today. No potassium or magnesium given

## 2020-05-12 NOTE — Patient Instructions (Signed)
Dehydration, Adult Dehydration is a condition in which there is not enough water or other fluids in the body. This happens when a person loses more fluids than he or she takes in. Important organs, such as the kidneys, brain, and heart, cannot function without a proper amount of fluids. Any loss of fluids from the body can lead to dehydration. Dehydration can be mild, moderate, or severe. It should be treated right away to prevent it from becoming severe. What are the causes? Dehydration may be caused by:  Conditions that cause loss of water or other fluids, such as diarrhea, vomiting, or sweating or urinating a lot.  Not drinking enough fluids, especially when you are ill or doing activities that require a lot of energy.  Other illnesses and conditions, such as fever or infection.  Certain medicines, such as medicines that remove excess fluid from the body (diuretics).  Lack of safe drinking water.  Not being able to get enough water and food. What increases the risk? The following factors may make you more likely to develop this condition:  Having a long-term (chronic) illness that has not been treated properly, such as diabetes, heart disease, or kidney disease.  Being 65 years of age or older.  Having a disability.  Living in a place that is high in altitude, where thinner, drier air causes more fluid loss.  Doing exercises that put stress on your body for a long time (endurance sports). What are the signs or symptoms? Symptoms of dehydration depend on how severe it is. Mild or moderate dehydration  Thirst.  Dry lips or dry mouth.  Dizziness or light-headedness, especially when standing up from a seated position.  Muscle cramps.  Dark urine. Urine may be the color of tea.  Less urine or tears produced than usual.  Headache. Severe dehydration  Changes in skin. Your skin may be cold and clammy, blotchy, or pale. Your skin also may not return to normal after being  lightly pinched and released.  Little or no tears, urine, or sweat.  Changes in vital signs, such as rapid breathing and low blood pressure. Your pulse may be weak or may be faster than 100 beats a minute when you are sitting still.  Other changes, such as: ? Feeling very thirsty. ? Sunken eyes. ? Cold hands and feet. ? Confusion. ? Being very tired (lethargic) or having trouble waking from sleep. ? Short-term weight loss. ? Loss of consciousness. How is this diagnosed? This condition is diagnosed based on your symptoms and a physical exam. You may have blood and urine tests to help confirm the diagnosis. How is this treated? Treatment for this condition depends on how severe it is. Treatment should be started right away. Do not wait until dehydration becomes severe. Severe dehydration is an emergency and needs to be treated in a hospital.  Mild or moderate dehydration can be treated at home. You may be asked to: ? Drink more fluids. ? Drink an oral rehydration solution (ORS). This drink helps restore proper amounts of fluids and salts and minerals in the blood (electrolytes).  Severe dehydration can be treated: ? With IV fluids. ? By correcting abnormal levels of electrolytes. This is often done by giving electrolytes through a tube that is passed through your nose and into your stomach (nasogastric tube, or NG tube). ? By treating the underlying cause of dehydration. Follow these instructions at home: Oral rehydration solution If told by your health care provider, drink an ORS:  Make   an ORS by following instructions on the package.  Start by drinking small amounts, about  cup (120 mL) every 5-10 minutes.  Slowly increase how much you drink until you have taken the amount recommended by your health care provider. Eating and drinking         Drink enough clear fluid to keep your urine pale yellow. If you were told to drink an ORS, finish the ORS first and then start slowly  drinking other clear fluids. Drink fluids such as: ? Water. Do not drink only water. Doing that can lead to hyponatremia, which is having too little salt (sodium) in the body. ? Water from ice chips you suck on. ? Fruit juice that you have added water to (diluted fruit juice). ? Low-calorie sports drinks.  Eat foods that contain a healthy balance of electrolytes, such as bananas, oranges, potatoes, tomatoes, and spinach.  Do not drink alcohol.  Avoid the following: ? Drinks that contain a lot of sugar. These include high-calorie sports drinks, fruit juice that is not diluted, and soda. ? Caffeine. ? Foods that are greasy or contain a lot of fat or sugar. General instructions  Take over-the-counter and prescription medicines only as told by your health care provider.  Do not take sodium tablets. Doing that can lead to having too much sodium in the body (hypernatremia).  Return to your normal activities as told by your health care provider. Ask your health care provider what activities are safe for you.  Keep all follow-up visits as told by your health care provider. This is important. Contact a health care provider if:  You have muscle cramps, pain, or discomfort, such as: ? Pain in your abdomen and the pain gets worse or stays in one area (localizes). ? Stiff neck.  You have a rash.  You are more irritable than usual.  You are sleepier or have a harder time waking than usual.  You feel weak or dizzy.  You feel very thirsty. Get help right away if you have:  Any symptoms of severe dehydration.  Symptoms of vomiting, such as: ? You cannot eat or drink without vomiting. ? Vomiting gets worse or does not go away. ? Vomit includes blood or green matter (bile).  Symptoms that get worse with treatment.  A fever.  A severe headache.  Problems with urination or bowel movements, such as: ? Diarrhea that gets worse or does not go away. ? Blood in your stool (feces). This  may cause stool to look black and tarry. ? Not urinating, or urinating only a small amount of very dark urine, within 6-8 hours.  Trouble breathing. These symptoms may represent a serious problem that is an emergency. Do not wait to see if the symptoms will go away. Get medical help right away. Call your local emergency services (911 in the [place] Do not drive yourself to the hospital. Summary  Dehydration is a condition in which there is not enough water or other fluids in the body. This happens when a person loses more fluids than he or she takes in.  Treatment for this condition depends on how severe it is. Treatment should be started right away. Do not wait until dehydration becomes severe.  Drink enough clear fluid to keep your urine pale yellow. If you were told to drink an oral rehydration solution (ORS), finish the ORS first and then start slowly drinking other clear fluids.  Take over-the-counter and prescription medicines only as told by your health care   provider.  Get help right away if you have any symptoms of severe dehydration. This information is not intended to replace advice given to you by your health care provider. Make sure you discuss any questions you have with your health care provider. Document Revised: 01/30/2019 Document Reviewed: 01/30/2019 Elsevier Patient Education  2020 [employer]   

## 2020-05-14 ENCOUNTER — Ambulatory Visit: Payer: 59

## 2020-05-18 ENCOUNTER — Ambulatory Visit: Payer: 59

## 2020-05-18 NOTE — Chronic Care Management (AMB) (Signed)
  Care Management   Note  05/18/2020 Name: Jibril Mcminn MRN: [id number] DOB: 1955/10/06  Marcello Tuzzolino is a 64 y.o. year old male who is a primary care patient of Chrismon, Vickki Muff, [place]. I reached out to [name] by phone today in response to a referral sent by Mr. Jakarius Flamenco health plan.    Mr. Sudbeck was given information about care management services today including:  1. Care management services include personalized support from designated clinical staff supervised by his physician, including individualized plan of care and coordination with other care providers 2. 24/7 contact phone numbers for assistance for urgent and routine care needs. 3. The patient may stop care management services at any time by phone call to the office staff.  Patient daughter [name] DPR on file declined assistance and services provided by embedded care coordination/care management team today.  Follow up plan: Patient daughter declines engagement by the care management team. Appropriate care team members and provider have been notified via electronic communication. The care management team is available to follow up with the patient after provider conversation with the patient regarding recommendation for care management engagement and subsequent re-referral to the care management team. The patient has been provided with contact information for the care management team and has been advised to call with any health related questions or concerns.   [facility] Management  Direct Dial: [phone]

## 2020-05-19 ENCOUNTER — Other Ambulatory Visit: Payer: Self-pay

## 2020-05-19 ENCOUNTER — Inpatient Hospital Stay: Payer: 59

## 2020-05-19 VITALS — BP 135/78 | HR 82 | Temp 98.2°F | Resp 18

## 2020-05-19 DIAGNOSIS — C099 Malignant neoplasm of tonsil, unspecified: Secondary | ICD-10-CM | POA: Diagnosis not present

## 2020-05-19 DIAGNOSIS — C098 Malignant neoplasm of overlapping sites of tonsil: Secondary | ICD-10-CM

## 2020-05-19 DIAGNOSIS — Z95828 Presence of other vascular implants and grafts: Secondary | ICD-10-CM

## 2020-05-19 LAB — CMP (CANCER CENTER ONLY)
ALT: 7 U/L (ref 0–44)
AST: 14 U/L — ABNORMAL LOW (ref 15–41)
Albumin: 3.7 g/dL (ref 3.5–5.0)
Alkaline Phosphatase: 62 U/L (ref 38–126)
Anion gap: 13 (ref 5–15)
BUN: 25 mg/dL — ABNORMAL HIGH (ref 8–23)
CO2: 24 mmol/L (ref 22–32)
Calcium: 9.8 mg/dL (ref 8.9–10.3)
Chloride: 97 mmol/L — ABNORMAL LOW (ref 98–111)
Creatinine: 1.51 mg/dL — ABNORMAL HIGH (ref 0.61–1.24)
GFR, Estimated: 52 mL/min — ABNORMAL LOW (ref >60)
Glucose, Bld: 117 mg/dL — ABNORMAL HIGH (ref 70–99)
Potassium: 3.7 mmol/L (ref 3.5–5.1)
Sodium: 134 mmol/L — ABNORMAL LOW (ref 135–145)
Total Bilirubin: 0.8 mg/dL (ref 0.3–1.2)
Total Protein: 6.7 g/dL (ref 6.5–8.1)

## 2020-05-19 LAB — CBC WITH DIFFERENTIAL (CANCER CENTER ONLY)
Abs Immature Granulocytes: 0.01 10*3/uL (ref 0.00–0.07)
Basophils Absolute: 0 10*3/uL (ref 0.0–0.1)
Basophils Relative: 1 %
Eosinophils Absolute: 0 10*3/uL (ref 0.0–0.5)
Eosinophils Relative: 1 %
HCT: 26.4 % — ABNORMAL LOW (ref 39.0–52.0)
Hemoglobin: 9.1 g/dL — ABNORMAL LOW (ref 13.0–17.0)
Immature Granulocytes: 0 %
Lymphocytes Relative: 11 %
Lymphs Abs: 0.4 10*3/uL — ABNORMAL LOW (ref 0.7–4.0)
MCH: 32.3 pg (ref 26.0–34.0)
MCHC: 34.5 g/dL (ref 30.0–36.0)
MCV: 93.6 fL (ref 80.0–100.0)
Monocytes Absolute: 0.3 10*3/uL (ref 0.1–1.0)
Monocytes Relative: 7 %
Neutro Abs: 3.2 10*3/uL (ref 1.7–7.7)
Neutrophils Relative %: 80 %
Platelet Count: 132 10*3/uL — ABNORMAL LOW (ref 150–400)
RBC: 2.82 MIL/uL — ABNORMAL LOW (ref 4.22–5.81)
RDW: 15.6 % — ABNORMAL HIGH (ref 11.5–15.5)
WBC Count: 4 10*3/uL (ref 4.0–10.5)
nRBC: 0 % (ref 0.0–0.2)

## 2020-05-19 LAB — MAGNESIUM: Magnesium: 1.6 mg/dL — ABNORMAL LOW (ref 1.7–2.4)

## 2020-05-19 MED ORDER — SODIUM CHLORIDE 0.9% FLUSH
10.0000 mL | Freq: Once | INTRAVENOUS | Status: DC
  Filled 2020-05-19: qty 10

## 2020-05-19 MED ORDER — SODIUM CHLORIDE 0.9 % IV SOLN
INTRAVENOUS | Status: DC
  Filled 2020-05-19 (×2): qty 250

## 2020-05-19 NOTE — Patient Instructions (Signed)
Dehydration, Adult Dehydration is a condition in which there is not enough water or other fluids in the body. This happens when a person loses more fluids than he or she takes in. Important organs, such as the kidneys, brain, and heart, cannot function without a proper amount of fluids. Any loss of fluids from the body can lead to dehydration. Dehydration can be mild, moderate, or severe. It should be treated right away to prevent it from becoming severe. What are the causes? Dehydration may be caused by:  Conditions that cause loss of water or other fluids, such as diarrhea, vomiting, or sweating or urinating a lot.  Not drinking enough fluids, especially when you are ill or doing activities that require a lot of energy.  Other illnesses and conditions, such as fever or infection.  Certain medicines, such as medicines that remove excess fluid from the body (diuretics).  Lack of safe drinking water.  Not being able to get enough water and food. What increases the risk? The following factors may make you more likely to develop this condition:  Having a long-term (chronic) illness that has not been treated properly, such as diabetes, heart disease, or kidney disease.  Being 65 years of age or older.  Having a disability.  Living in a place that is high in altitude, where thinner, drier air causes more fluid loss.  Doing exercises that put stress on your body for a long time (endurance sports). What are the signs or symptoms? Symptoms of dehydration depend on how severe it is. Mild or moderate dehydration  Thirst.  Dry lips or dry mouth.  Dizziness or light-headedness, especially when standing up from a seated position.  Muscle cramps.  Dark urine. Urine may be the color of tea.  Less urine or tears produced than usual.  Headache. Severe dehydration  Changes in skin. Your skin may be cold and clammy, blotchy, or pale. Your skin also may not return to normal after being  lightly pinched and released.  Little or no tears, urine, or sweat.  Changes in vital signs, such as rapid breathing and low blood pressure. Your pulse may be weak or may be faster than 100 beats a minute when you are sitting still.  Other changes, such as: ? Feeling very thirsty. ? Sunken eyes. ? Cold hands and feet. ? Confusion. ? Being very tired (lethargic) or having trouble waking from sleep. ? Short-term weight loss. ? Loss of consciousness. How is this diagnosed? This condition is diagnosed based on your symptoms and a physical exam. You may have blood and urine tests to help confirm the diagnosis. How is this treated? Treatment for this condition depends on how severe it is. Treatment should be started right away. Do not wait until dehydration becomes severe. Severe dehydration is an emergency and needs to be treated in a hospital.  Mild or moderate dehydration can be treated at home. You may be asked to: ? Drink more fluids. ? Drink an oral rehydration solution (ORS). This drink helps restore proper amounts of fluids and salts and minerals in the blood (electrolytes).  Severe dehydration can be treated: ? With IV fluids. ? By correcting abnormal levels of electrolytes. This is often done by giving electrolytes through a tube that is passed through your nose and into your stomach (nasogastric tube, or NG tube). ? By treating the underlying cause of dehydration. Follow these instructions at home: Oral rehydration solution If told by your health care provider, drink an ORS:  Make   an ORS by following instructions on the package.  Start by drinking small amounts, about  cup (120 mL) every 5-10 minutes.  Slowly increase how much you drink until you have taken the amount recommended by your health care provider. Eating and drinking         Drink enough clear fluid to keep your urine pale yellow. If you were told to drink an ORS, finish the ORS first and then start slowly  drinking other clear fluids. Drink fluids such as: ? Water. Do not drink only water. Doing that can lead to hyponatremia, which is having too little salt (sodium) in the body. ? Water from ice chips you suck on. ? Fruit juice that you have added water to (diluted fruit juice). ? Low-calorie sports drinks.  Eat foods that contain a healthy balance of electrolytes, such as bananas, oranges, potatoes, tomatoes, and spinach.  Do not drink alcohol.  Avoid the following: ? Drinks that contain a lot of sugar. These include high-calorie sports drinks, fruit juice that is not diluted, and soda. ? Caffeine. ? Foods that are greasy or contain a lot of fat or sugar. General instructions  Take over-the-counter and prescription medicines only as told by your health care provider.  Do not take sodium tablets. Doing that can lead to having too much sodium in the body (hypernatremia).  Return to your normal activities as told by your health care provider. Ask your health care provider what activities are safe for you.  Keep all follow-up visits as told by your health care provider. This is important. Contact a health care provider if:  You have muscle cramps, pain, or discomfort, such as: ? Pain in your abdomen and the pain gets worse or stays in one area (localizes). ? Stiff neck.  You have a rash.  You are more irritable than usual.  You are sleepier or have a harder time waking than usual.  You feel weak or dizzy.  You feel very thirsty. Get help right away if you have:  Any symptoms of severe dehydration.  Symptoms of vomiting, such as: ? You cannot eat or drink without vomiting. ? Vomiting gets worse or does not go away. ? Vomit includes blood or green matter (bile).  Symptoms that get worse with treatment.  A fever.  A severe headache.  Problems with urination or bowel movements, such as: ? Diarrhea that gets worse or does not go away. ? Blood in your stool (feces). This  may cause stool to look black and tarry. ? Not urinating, or urinating only a small amount of very dark urine, within 6-8 hours.  Trouble breathing. These symptoms may represent a serious problem that is an emergency. Do not wait to see if the symptoms will go away. Get medical help right away. Call your local emergency services (911 in the [place] Do not drive yourself to the hospital. Summary  Dehydration is a condition in which there is not enough water or other fluids in the body. This happens when a person loses more fluids than he or she takes in.  Treatment for this condition depends on how severe it is. Treatment should be started right away. Do not wait until dehydration becomes severe.  Drink enough clear fluid to keep your urine pale yellow. If you were told to drink an oral rehydration solution (ORS), finish the ORS first and then start slowly drinking other clear fluids.  Take over-the-counter and prescription medicines only as told by your health care   provider.  Get help right away if you have any symptoms of severe dehydration. This information is not intended to replace advice given to you by your health care provider. Make sure you discuss any questions you have with your health care provider. Document Revised: 01/30/2019 Document Reviewed: 01/30/2019 Elsevier Patient Education  2020 [employer]   

## 2020-05-21 ENCOUNTER — Ambulatory Visit: Payer: 59 | Admitting: Hematology and Oncology

## 2020-05-21 ENCOUNTER — Ambulatory Visit: Payer: 59

## 2020-05-21 ENCOUNTER — Other Ambulatory Visit: Payer: 59

## 2020-05-26 ENCOUNTER — Inpatient Hospital Stay: Payer: 59

## 2020-05-26 ENCOUNTER — Other Ambulatory Visit: Payer: Self-pay

## 2020-05-26 VITALS — BP 134/78 | HR 87 | Temp 98.5°F | Resp 16

## 2020-05-26 DIAGNOSIS — C099 Malignant neoplasm of tonsil, unspecified: Secondary | ICD-10-CM

## 2020-05-26 DIAGNOSIS — Z95828 Presence of other vascular implants and grafts: Secondary | ICD-10-CM

## 2020-05-26 LAB — CBC WITH DIFFERENTIAL (CANCER CENTER ONLY)
Abs Immature Granulocytes: 0 10*3/uL (ref 0.00–0.07)
Basophils Absolute: 0 10*3/uL (ref 0.0–0.1)
Basophils Relative: 0 %
Eosinophils Absolute: 0 10*3/uL (ref 0.0–0.5)
Eosinophils Relative: 1 %
HCT: 27.8 % — ABNORMAL LOW (ref 39.0–52.0)
Hemoglobin: 9.5 g/dL — ABNORMAL LOW (ref 13.0–17.0)
Immature Granulocytes: 0 %
Lymphocytes Relative: 9 %
Lymphs Abs: 0.3 10*3/uL — ABNORMAL LOW (ref 0.7–4.0)
MCH: 32.4 pg (ref 26.0–34.0)
MCHC: 34.2 g/dL (ref 30.0–36.0)
MCV: 94.9 fL (ref 80.0–100.0)
Monocytes Absolute: 0.2 10*3/uL (ref 0.1–1.0)
Monocytes Relative: 7 %
Neutro Abs: 2.8 10*3/uL (ref 1.7–7.7)
Neutrophils Relative %: 83 %
Platelet Count: 132 10*3/uL — ABNORMAL LOW (ref 150–400)
RBC: 2.93 MIL/uL — ABNORMAL LOW (ref 4.22–5.81)
RDW: 14 % (ref 11.5–15.5)
WBC Count: 3.4 10*3/uL — ABNORMAL LOW (ref 4.0–10.5)
nRBC: 0 % (ref 0.0–0.2)

## 2020-05-26 LAB — CMP (CANCER CENTER ONLY)
ALT: 11 U/L (ref 0–44)
AST: 18 U/L (ref 15–41)
Albumin: 3.6 g/dL (ref 3.5–5.0)
Alkaline Phosphatase: 63 U/L (ref 38–126)
Anion gap: 10 (ref 5–15)
BUN: 15 mg/dL (ref 8–23)
CO2: 24 mmol/L (ref 22–32)
Calcium: 9.2 mg/dL (ref 8.9–10.3)
Chloride: 101 mmol/L (ref 98–111)
Creatinine: 1.38 mg/dL — ABNORMAL HIGH (ref 0.61–1.24)
GFR, Estimated: 57 mL/min — ABNORMAL LOW (ref >60)
Glucose, Bld: 119 mg/dL — ABNORMAL HIGH (ref 70–99)
Potassium: 4.3 mmol/L (ref 3.5–5.1)
Sodium: 135 mmol/L (ref 135–145)
Total Bilirubin: 0.7 mg/dL (ref 0.3–1.2)
Total Protein: 6.5 g/dL (ref 6.5–8.1)

## 2020-05-26 LAB — MAGNESIUM: Magnesium: 1.8 mg/dL (ref 1.7–2.4)

## 2020-05-26 MED ORDER — SODIUM CHLORIDE 0.9% FLUSH
10.0000 mL | Freq: Once | INTRAVENOUS | Status: AC
  Administered 2020-05-26: 10 mL
  Filled 2020-05-26: qty 10

## 2020-05-26 MED ORDER — SODIUM CHLORIDE 0.9 % IV SOLN
Freq: Once | INTRAVENOUS | Status: AC
  Filled 2020-05-26: qty 250

## 2020-05-26 MED ORDER — HEPARIN SOD (PORK) LOCK FLUSH 100 UNIT/ML IV SOLN
500.0000 [IU] | Freq: Once | INTRAVENOUS | Status: AC
  Administered 2020-05-26: 500 [IU]
  Filled 2020-05-26: qty 5

## 2020-05-26 NOTE — Progress Notes (Signed)
Patient discharged in stable condition.

## 2020-05-26 NOTE — Progress Notes (Signed)
  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  Cancer Staging: STAGE III Tonsil Cancer, p16+    Cancer Staging Cancer of overlapping sites of tonsil [facility]) Staging form: Pharynx - HPV-Mediated Oropharynx, AJCC 8th Edition - Clinical stage from 01/27/2020: Stage III (cT4, cN1, cM0, p16+) - Signed by [name], MD on 01/31/2020  Tonsillar cancer [facility]) Staging form: Pharynx - P16 Negative Oropharynx, AJCC 8th Edition - Clinical stage from 02/17/2020: Stage IVA (cT4a, cN2b, cM0, p16+) - Signed by [name], MD on 03/02/2020   Intent: Curative  Radiation Treatment Dates: 02/18/2020 through 04/12/2020 Site Technique Total Dose (Gy) Dose per Fx (Gy) Completed Fx Beam Energies  Tonsil, Right: HN_Rt_tonsil IMRT 70/70 2 35/35 6X   Narrative: The patient tolerated radiation therapy with expected difficulty but relatively well, completing all 7 weeks of treatment   Plan: The patient will follow-up with radiation oncology in 2 -3wks . -----------------------------------  [name], MD

## 2020-05-26 NOTE — Patient Instructions (Signed)

## 2020-06-01 ENCOUNTER — Inpatient Hospital Stay: Payer: 59

## 2020-06-01 NOTE — Progress Notes (Signed)
Patient Care Team: Chrismon, Vickki Muff, PA as PCP - General (Family Medicine) [name], MD as Consulting Physician (Cardiothoracic Surgery) [name], MD as Consulting Physician (Cardiology) [name], MD as Consulting Physician (Hematology and Oncology) [name], MD as Attending Physician (Radiation Oncology) [name], [name], RN as Oncology Nurse Navigator [name], [name], Wittmann as Speech Language Pathologist (Speech Pathology) [name], DDS as Consulting Physician (Dentistry) [name], [name], PT as Physical Therapist (Physical Therapy) [name], LCSW as Social Worker (General Practice) [name], LCSW as Social Worker [name], RD as Dietitian (Nutrition) Neldon Labella, RN as Registered Nurse  DIAGNOSIS:    ICD-10-CM   1. 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  Two right cervical lymph nodes metastases measuring 2.5 cm and 2.1 cm   02/17/2020 -  Chemotherapy   The patient had dexamethasone (DECADRON) 4 MG tablet, 8 mg, Oral, Daily, 1 of 1 cycle, Start date: 02/02/2020, End date: -- palonosetron (ALOXI) injection 0.25 mg, 0.25 mg, Intravenous,  Once, 3 of 3 cycles Administration: 0.25 mg (02/17/2020), 0.25 mg (03/02/2020), 0.25 mg (03/23/2020) CISplatin (PLATINOL) 173 mg in sodium chloride 0.9 % 500 mL chemo infusion, 75 mg/m2 = 173 mg (100 % of original dose 75 mg/m2), Intravenous,  Once, 3 of 3 cycles Dose modification: 75 mg/m2 (original dose 75 mg/m2, Cycle 1, Reason: Provider Judgment), 60 mg/m2 (original dose 75 mg/m2, Cycle 2, Reason: Dose not tolerated) Administration: 173 mg (02/17/2020), 138 mg (03/02/2020), 138 mg (03/23/2020) fosaprepitant (EMEND) 150 mg in sodium  chloride 0.9 % 145 mL IVPB, 150 mg, Intravenous,  Once, 3 of 3 cycles Administration: 150 mg (02/17/2020), 150 mg (03/02/2020), 150 mg (03/23/2020)  for chemotherapy treatment.    02/17/2020 Cancer Staging   Staging form: Pharynx - P16 Negative Oropharynx, AJCC 8th Edition - Clinical stage from 02/17/2020: Stage IVA (cT4a, cN2b, cM0, p16+) - Signed by [name], MD on 03/02/2020   Cancer of overlapping sites of tonsil [facility])  01/27/2020 Cancer Staging   Staging form: Pharynx - HPV-Mediated Oropharynx, AJCC 8th Edition - Clinical stage from 01/27/2020: Stage III (cT4, cN1, cM0, p16+) - Signed by [name], MD on 01/31/2020   01/31/2020 Initial Diagnosis   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[place])     CHIEF COMPLIANT: Follow-up of tonsillar cancer status post chemoradiation  INTERVAL HISTORY: Omar Christian is a 64 y.o. with above-mentioned history of tonsil cancer who completed treatment with radiation and chemotherapy with cisplatin.He presents to the clinic todayforfollow-up.  His major complaint today is burning sensation in his mouth.  He does not want to take Claritin because it caused burning sensation.  He is eating very little but is using tube feeds significantly.  He is here to receive IV fluids.  ALLERGIES:  has No Known Allergies.  MEDICATIONS:  Current Outpatient Medications  Medication Sig Dispense Refill  . allopurinol (ZYLOPRIM) 100 MG tablet Take 1 tablet (100 mg total) by mouth daily. 30 tablet 12  . atorvastatin (LIPITOR) 20 MG tablet Take 1 tablet (20 mg total) by mouth daily. 90 tablet 3  . dexamethasone (DECADRON) 4 MG tablet Take 2 tablets (8 mg total) by mouth daily. Take daily for 3 days after chemo. Take with  food. 20 tablet 0  . guaiFENesin (ROBITUSSIN) 100 MG/5ML liquid Take 20 mLs (400 mg total) by mouth 3 (three) times daily as needed for cough. May loosen secretions. 1000 mL 3  . lidocaine (XYLOCAINE) 2 % solution Patient: Mix 1part 2% viscous lidocaine,  1part H20. Swish & swallow 53mL of diluted mixture, 59min before eating and at bedtime, up to 5xs daily. 200 mL 4  . lidocaine-prilocaine (EMLA) cream Apply to affected area once 30 g 3  . LORazepam (ATIVAN) 0.5 MG tablet Take 1 tablet (0.5 mg total) by mouth at bedtime as needed (Nausea or vomiting). 30 tablet 0  . magic mouthwash w/lidocaine SOLN Take 5 mLs by mouth 5 (five) times daily as needed for mouth pain. 240 mL 1  . metoCLOPramide (REGLAN) 10 MG/10ML SOLN Take 5 mLs (5 mg total) by mouth 3 (three) times daily before meals. 1200 mL 0  . metoprolol succinate (TOPROL XL) 25 MG 24 hr tablet Take 1 tablet (25 mg total) by mouth daily. 30 tablet 3  . MITIGARE 0.6 MG CAPS Take 1 capsule by mouth daily.    . Nutritional Supplements (KATE FARMS STANDARD 1.4) LIQD 488 mLs by Gastrostomy Tube route 4 (four) times daily. 2730 mL 6  . nystatin (MYCOSTATIN) 100000 UNIT/ML suspension TAKE 5 MLS (500,000 UNITS TOTAL) BY MOUTH IN THE MORNING, AT NOON, AND AT BEDTIME. 60 mL 0  . ondansetron (ZOFRAN) 8 MG tablet Take 1 tablet (8 mg total) by mouth 2 (two) times daily as needed. Start on the third day after chemotherapy. 30 tablet 1  . oxyCODONE-acetaminophen (PERCOCET/ROXICET) 5-325 MG tablet Take 1 tablet by mouth every 4 (four) hours as needed.     . prochlorperazine (COMPAZINE) 10 MG tablet Take 1 tablet (10 mg total) by mouth every 6 (six) hours as needed (Nausea or vomiting). 30 tablet 1  . scopolamine (TRANSDERM-SCOP) 1 MG/3DAYS Place 1 patch (1.5 mg total) onto the skin every 3 (three) days. 10 patch 2  . sodium fluoride (PREVIDENT 5000 PLUS) 1.1 % CREA dental cream Apply to tooth brush. Brush teeth for 2 minutes. Spit out excess-DO NOT swallow. DO NOT rinse afterwards. Repeat nightly. 51 g prn   No current facility-administered medications for this visit.    PHYSICAL EXAMINATION: ECOG PERFORMANCE STATUS: 1 - Symptomatic but completely ambulatory  Vitals:   06/02/20 1422  BP: 137/81  Pulse: 81    Resp: 18  Temp: 98.7 F (37.1 C)  SpO2: 99%   Filed Weights   06/02/20 1422  Weight: 196 lb 11.2 oz (89.2 kg)    LABORATORY DATA:  I have reviewed the data as listed CMP Latest Ref Rng & Units 05/26/2020 05/19/2020 05/05/2020  Glucose 70 - 99 mg/dL 119(H) 117(H) 100(H)  BUN 8 - 23 mg/dL 15 25(H) 22  Creatinine 0.61 - 1.24 mg/dL 1.38(H) 1.51(H) 1.23  Sodium 135 - 145 mmol/L 135 134(L) 134(L)  Potassium 3.5 - 5.1 mmol/L 4.3 3.7 4.2  Chloride 98 - 111 mmol/L 101 97(L) 99  CO2 22 - 32 mmol/L 24 24 28   Calcium 8.9 - 10.3 mg/dL 9.2 9.8 8.7(L)  Total Protein 6.5 - 8.1 g/dL 6.5 6.7 6.0(L)  Total Bilirubin 0.3 - 1.2 mg/dL 0.7 0.8 0.4  Alkaline Phos 38 - 126 U/L 63 62 64  AST 15 - 41 U/L 18 14(L) 14(L)  ALT 0 - 44 U/L 11 7 11     Lab Results  Component Value Date   WBC 3.6 (L) 06/02/2020   HGB  9.6 (L) 06/02/2020   HCT 27.7 (L) 06/02/2020   MCV 95.2 06/02/2020   PLT 116 (L) 06/02/2020   NEUTROABS 2.9 06/02/2020    ASSESSMENT & PLAN:  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 Two right cervical lymph nodesmetastases measuring 2.5 cm and 2.1 cm T4N2BM0 stage IVa HPV status:Positive  Treatment plan:Concurrent chemoradiation with cisplatin every 3 weeks x3.Radiation completed 04/12/2020 Patient was getting IV fluids3 times a week at [place]. Bradycardia: Cardiology referralplaced(previously he had bypass surgery)  Chemo toxicities: 1.Weight loss: on PEG tube feeds. 2.Fatigue 3.Worsening hearing impairment: Cisplatin related worsening of an already poor hearing 4.Chemotherapy-induced anemia: Monitoring. He does not have any symptoms of anemia and therefore we will watch and monitor with his hemoglobin of 9.6 5.Dehydration:  Following with nutritionist/dietitian, we will do IV fluids once a week x4. 6.Renal  insufficiency: Creatinine 1.5 could be related to cisplatin induced kidney damage.  His BUN is normal and therefore I believe he is getting enough fluids. I do not recommend continuation of any further IV fluids at this time.  However if he notices a change in his urine color we will reinitiate IV fluids.  Burning in the mouth: He does not want to take lidocaine.  He will try chocolate milk. Lab review: WBC 3.6, hemoglobin 9.6, I discussed with him that he needs to see ENT for further evaluation as well. He is being set up for a PET CT scan and follow-up with Dr. Isidore Moos.     No orders of the defined types were placed in this encounter.  The patient has a good understanding of the overall plan. he agrees with it. he will call with any problems that may develop before the next visit here.  Total time spent: 30 mins including face to face time and time spent for planning, charting and coordination of care  [name], MD 06/02/2020  I, Cloyde Reams Dorshimer, am acting as scribe for Dr. [name].  I have reviewed the above documentation for accuracy and completeness, and I agree with the above.

## 2020-06-01 NOTE — Progress Notes (Signed)
Nutrition Follow-up:  Patient with tonsil cancer.  Patient has completed chemotherapy and radiation therapy.    Spoke with daughter Omar Christian via phone for nutrition follow-up.  Omar Christian reports that patient is giving about 4 1/2- 6 cartons of [employer] 1.4 via feeding tube daily.  Reports that patient is participating more with giving formula.  Patient is trying foods orally as well, Dr Malachi Bonds (burned), ice cream (feels stuck in throat), hot chocolate, water (got strangled). Omar Christian reports that some foods burn or patient says taste like gasoline.  Reports trying scopolamine patch to help with secretions.    SLP notes reviewed.      Medications: reviewed  Labs: reviewed  Anthropometrics:   Weight noted 201 lb on 11/3   NUTRITION DIAGNOSIS: Unintentional weight loss stable   INTERVENTION:  Encouraged daughter to have patient try ensure/boost or [employer] 1.4 shake orally. If able to drink orally can reduce a carton of tube feeding.   Encouraged trying additional puree and finely chopped solid foods.       MONITORING, EVALUATION, GOAL: weight trends, intake, tube feeding   NEXT VISIT: Dec 28 phone call with Omar Christian  Omar Christian, Omar Christian, Omar Christian Registered Dietitian [phone] (mobile)

## 2020-06-01 NOTE — Assessment & Plan Note (Signed)
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 Two right cervical lymph nodesmetastases measuring 2.5 cm and 2.1 cm T4N2BM0 stage IVa HPV status:Positive  Treatment plan:Concurrent chemoradiation with cisplatin every 3 weeks x3.Radiation completed 04/12/2020 Patient was getting IV fluids3 times a week at [place]. Bradycardia: Cardiology referralplaced(previously he had bypass surgery)  Chemo toxicities: 1.Weight loss: on PEG tube feeds. 2.Fatigue 3.Worsening hearing impairment: Cisplatin related worsening of an already poor hearing 4.Chemotherapy-induced anemia: Monitoring. He does not have any symptoms of anemia and therefore we will watch and monitor with his hemoglobin of 7.5. 5.Dehydration:  Following with nutritionist/dietitian, we will do IV fluids once a week x4. 6.Renal insufficiency: Due to dehydration  Lab review: WBC 3.5, hemoglobin 7.5,   will see him back in 4 weeks.

## 2020-06-02 ENCOUNTER — Inpatient Hospital Stay: Payer: 59

## 2020-06-02 ENCOUNTER — Other Ambulatory Visit: Payer: Self-pay

## 2020-06-02 ENCOUNTER — Inpatient Hospital Stay (HOSPITAL_BASED_OUTPATIENT_CLINIC_OR_DEPARTMENT_OTHER): Payer: 59 | Admitting: Hematology and Oncology

## 2020-06-02 DIAGNOSIS — Z951 Presence of aortocoronary bypass graft: Secondary | ICD-10-CM | POA: Insufficient documentation

## 2020-06-02 DIAGNOSIS — I890 Lymphedema, not elsewhere classified: Secondary | ICD-10-CM | POA: Diagnosis present

## 2020-06-02 DIAGNOSIS — I2510 Atherosclerotic heart disease of native coronary artery without angina pectoris: Secondary | ICD-10-CM | POA: Insufficient documentation

## 2020-06-02 DIAGNOSIS — K219 Gastro-esophageal reflux disease without esophagitis: Secondary | ICD-10-CM | POA: Insufficient documentation

## 2020-06-02 DIAGNOSIS — E785 Hyperlipidemia, unspecified: Secondary | ICD-10-CM | POA: Insufficient documentation

## 2020-06-02 DIAGNOSIS — T451X5A Adverse effect of antineoplastic and immunosuppressive drugs, initial encounter: Secondary | ICD-10-CM | POA: Insufficient documentation

## 2020-06-02 DIAGNOSIS — Z9221 Personal history of antineoplastic chemotherapy: Secondary | ICD-10-CM | POA: Insufficient documentation

## 2020-06-02 DIAGNOSIS — Z923 Personal history of irradiation: Secondary | ICD-10-CM | POA: Insufficient documentation

## 2020-06-02 DIAGNOSIS — D6481 Anemia due to antineoplastic chemotherapy: Secondary | ICD-10-CM | POA: Insufficient documentation

## 2020-06-02 DIAGNOSIS — C098 Malignant neoplasm of overlapping sites of tonsil: Secondary | ICD-10-CM | POA: Insufficient documentation

## 2020-06-02 DIAGNOSIS — C099 Malignant neoplasm of tonsil, unspecified: Secondary | ICD-10-CM | POA: Diagnosis present

## 2020-06-02 DIAGNOSIS — I10 Essential (primary) hypertension: Secondary | ICD-10-CM | POA: Insufficient documentation

## 2020-06-02 DIAGNOSIS — R293 Abnormal posture: Secondary | ICD-10-CM | POA: Diagnosis present

## 2020-06-02 DIAGNOSIS — E860 Dehydration: Secondary | ICD-10-CM | POA: Insufficient documentation

## 2020-06-02 DIAGNOSIS — H9190 Unspecified hearing loss, unspecified ear: Secondary | ICD-10-CM | POA: Insufficient documentation

## 2020-06-02 DIAGNOSIS — Z931 Gastrostomy status: Secondary | ICD-10-CM | POA: Insufficient documentation

## 2020-06-02 DIAGNOSIS — Z95828 Presence of other vascular implants and grafts: Secondary | ICD-10-CM

## 2020-06-02 DIAGNOSIS — N289 Disorder of kidney and ureter, unspecified: Secondary | ICD-10-CM | POA: Insufficient documentation

## 2020-06-02 DIAGNOSIS — R221 Localized swelling, mass and lump, neck: Secondary | ICD-10-CM | POA: Insufficient documentation

## 2020-06-02 DIAGNOSIS — Z79899 Other long term (current) drug therapy: Secondary | ICD-10-CM | POA: Insufficient documentation

## 2020-06-02 LAB — CBC WITH DIFFERENTIAL (CANCER CENTER ONLY)
Abs Immature Granulocytes: 0.01 10*3/uL (ref 0.00–0.07)
Basophils Absolute: 0 10*3/uL (ref 0.0–0.1)
Basophils Relative: 0 %
Eosinophils Absolute: 0.1 10*3/uL (ref 0.0–0.5)
Eosinophils Relative: 1 %
HCT: 27.7 % — ABNORMAL LOW (ref 39.0–52.0)
Hemoglobin: 9.6 g/dL — ABNORMAL LOW (ref 13.0–17.0)
Immature Granulocytes: 0 %
Lymphocytes Relative: 11 %
Lymphs Abs: 0.4 10*3/uL — ABNORMAL LOW (ref 0.7–4.0)
MCH: 33 pg (ref 26.0–34.0)
MCHC: 34.7 g/dL (ref 30.0–36.0)
MCV: 95.2 fL (ref 80.0–100.0)
Monocytes Absolute: 0.2 10*3/uL (ref 0.1–1.0)
Monocytes Relative: 6 %
Neutro Abs: 2.9 10*3/uL (ref 1.7–7.7)
Neutrophils Relative %: 82 %
Platelet Count: 116 10*3/uL — ABNORMAL LOW (ref 150–400)
RBC: 2.91 MIL/uL — ABNORMAL LOW (ref 4.22–5.81)
RDW: 13.1 % (ref 11.5–15.5)
WBC Count: 3.6 10*3/uL — ABNORMAL LOW (ref 4.0–10.5)
nRBC: 0 % (ref 0.0–0.2)

## 2020-06-02 LAB — CMP (CANCER CENTER ONLY)
ALT: 8 U/L (ref 0–44)
AST: 15 U/L (ref 15–41)
Albumin: 3.8 g/dL (ref 3.5–5.0)
Alkaline Phosphatase: 69 U/L (ref 38–126)
Anion gap: 9 (ref 5–15)
BUN: 17 mg/dL (ref 8–23)
CO2: 24 mmol/L (ref 22–32)
Calcium: 9.3 mg/dL (ref 8.9–10.3)
Chloride: 100 mmol/L (ref 98–111)
Creatinine: 1.5 mg/dL — ABNORMAL HIGH (ref 0.61–1.24)
GFR, Estimated: 52 mL/min — ABNORMAL LOW (ref >60)
Glucose, Bld: 105 mg/dL — ABNORMAL HIGH (ref 70–99)
Potassium: 4.2 mmol/L (ref 3.5–5.1)
Sodium: 133 mmol/L — ABNORMAL LOW (ref 135–145)
Total Bilirubin: 0.6 mg/dL (ref 0.3–1.2)
Total Protein: 6.4 g/dL — ABNORMAL LOW (ref 6.5–8.1)

## 2020-06-02 LAB — MAGNESIUM: Magnesium: 1.7 mg/dL (ref 1.7–2.4)

## 2020-06-02 MED ORDER — SODIUM CHLORIDE 0.9 % IV SOLN
Freq: Once | INTRAVENOUS | Status: AC
  Filled 2020-06-02: qty 250

## 2020-06-02 NOTE — Patient Instructions (Signed)

## 2020-06-02 NOTE — Progress Notes (Signed)
Pt discharged in no apparent distress. Pt left ambulatory without assistance. Pt aware of discharge instructions and verbalized understanding and had no further questions.  

## 2020-06-04 ENCOUNTER — Telehealth: Payer: Self-pay | Admitting: Hematology and Oncology

## 2020-06-04 NOTE — Telephone Encounter (Signed)
No 12/1 los, no changes made to pt schedule

## 2020-06-09 ENCOUNTER — Encounter: Payer: Self-pay | Admitting: Hematology and Oncology

## 2020-06-14 ENCOUNTER — Other Ambulatory Visit: Payer: Self-pay

## 2020-06-14 ENCOUNTER — Inpatient Hospital Stay (HOSPITAL_BASED_OUTPATIENT_CLINIC_OR_DEPARTMENT_OTHER): Payer: 59 | Admitting: Medical

## 2020-06-14 VITALS — BP 144/88 | HR 90 | Temp 98.0°F | Resp 15 | Ht 72.0 in | Wt 198.8 lb

## 2020-06-14 DIAGNOSIS — I890 Lymphedema, not elsewhere classified: Secondary | ICD-10-CM | POA: Diagnosis not present

## 2020-06-14 DIAGNOSIS — C099 Malignant neoplasm of tonsil, unspecified: Secondary | ICD-10-CM | POA: Diagnosis not present

## 2020-06-14 DIAGNOSIS — R293 Abnormal posture: Secondary | ICD-10-CM | POA: Diagnosis not present

## 2020-06-14 NOTE — Progress Notes (Signed)
Pt seen by PA Van only, no assessment by [facility] RN at this time (time constraints).  PA aware. 

## 2020-06-16 NOTE — Progress Notes (Signed)
Symptoms Management Clinic Progress Note   Omar Christian [id number] 1956/06/03 64 y.o.  Omar Christian is managed by Dr. 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  Tonsillar cancer: The patient is status post concurrent chemoradiation.  He is scheduled to see Dr. Isidore Moos on 07/20/2020 and does not have a return appointment as yet with Dr. Lindi Adie. . Please see After Visit Summary for patient specific instructions.  Future Appointments  Date Time Provider [place]  06/29/2020  9:45 AM Jennet Maduro, RD [facility] None  07/20/2020  2:00 PM [name], MD [name] None  07/20/2020  3:30 PM [name], [name], CCC-SLP [facility]    Orders Placed This Encounter  Procedures  . Ambulatory referral to Physical Therapy       Subjective:   Patient ID:  Omar Christian is a 64 y.o. (DOB May 05, 1956) male.  Chief Complaint:  Chief Complaint  Patient presents with  . Neck Swelling    HPI Christine Schiefelbein  is a 64 y.o. male with a diagnosis of cancer of the tonsil.  He was treated with concurrent chemoradiation.  He presents to the clinic today with a report of swelling in his neck.  He has had no changes in activity.  He has never been seen by the lymphedema clinic.  He is having no difficulty with swallowing.   Medications: I have reviewed the patient's current medications.  Allergies: No Known Allergies  Past Medical History:  Diagnosis Date  . Abnormal findings on cardiac catheterization 09/30/15   [facility]  . Abnormal myocardial perfusion study 09/24/15   [facility]  . CAD, multiple vessel 09/30/15   per CATH @ [facility]/DR.PARACHOS  . Coronary  artery disease   . GERD (gastroesophageal reflux disease)   . Gout   . H/O echocardiogram 09/24/15   [facility]  . Hard of hearing   . History of pneumonia   . Hyperlipidemia   . Hypertension   . Pneumonia   . Progressive angina ([place])   . Tonsil cancer ([place])    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    Past Surgical History:  Procedure Laterality Date  . CARDIAC CATHETERIZATION N/A 09/30/2015   Procedure: Left Heart Cath and Coronary Angiography;  Surgeon: [name], MD;  Location: [place] CV LAB;  Service: Cardiovascular;  Laterality: N/A;  . CORONARY ARTERY BYPASS GRAFT N/A 10/08/2015   Procedure: CORONARY ARTERY BYPASS GRAFTING time 4 using left internal mammary and right greater saphenous vein harvested by endovein;  Surgeon: [name], MD;  Location: [place];  Service: Open Heart Surgery;  Laterality: N/A;  . CORONARY ARTERY BYPASS GRAFT    . IR GASTROSTOMY TUBE MOD SED  02/10/2020  . IR IMAGING GUIDED PORT INSERTION  02/10/2020  . MULTIPLE EXTRACTIONS WITH ALVEOLOPLASTY N/A 02/04/2020   Procedure: Extraction of tooth #'s [phone], and 32 with alveoloplasty and gross debridement of remaining teeth.;  Surgeon: [name], DDS;  Location: [place];  Service: Oral Surgery;  Laterality: N/A;  . TEE WITHOUT CARDIOVERSION N/A 10/08/2015   Procedure: TRANSESOPHAGEAL ECHOCARDIOGRAM (TEE);  Surgeon: [name], MD;  Location: [place];  Service: Open Heart Surgery;  Laterality: N/A;  . VASECTOMY    .  WISDOM TOOTH EXTRACTION      Family History  Problem Relation Age of Onset  . Heart disease Mother   . Hypertension Mother   . Alcohol abuse Father   . Throat cancer Father   . Muscular dystrophy Brother   . Muscular dystrophy Brother     Social History   Socioeconomic History  . Marital status: Married    Spouse name: Not on file  . Number of children: 2  . Years of education: Not on file  . Highest education level: Not on file  Occupational  History  . Not on file  Tobacco Use  . Smoking status: Never Smoker  . Smokeless tobacco: Never Used  Vaping Use  . Vaping Use: Never used  Substance and Sexual Activity  . Alcohol use: No  . Drug use: No  . Sexual activity: Not Currently    Birth control/protection: Surgical    Comment: Vasectomy  Other Topics Concern  . Not on file  Social History Narrative  . Not on file   Social Determinants of Health   Financial Resource Strain: Low Risk   . Difficulty of Paying Living Expenses: Not very hard  Food Insecurity: Not on file  Transportation Needs: No Transportation Needs  . Lack of Transportation (Medical): No  . Lack of Transportation (Non-Medical): No  Physical Activity: Not on file  Stress: Not on file  Social Connections: Not on file  Intimate Partner Violence: Not on file    Past Medical History, Surgical history, Social history, and Family history were reviewed and updated as appropriate.   Please see review of systems for further details on the patient's review from today.   Review of Systems:  Review of Systems  Constitutional: Negative for chills, diaphoresis and fever.  HENT: Negative for trouble swallowing and voice change.   Respiratory: Negative for cough, chest tightness, shortness of breath and wheezing.   Cardiovascular: Negative for chest pain and palpitations.  Gastrointestinal: Negative for abdominal pain, constipation, diarrhea, nausea and vomiting.  Musculoskeletal: Negative for back pain and myalgias.       Swelling of the neck  Neurological: Negative for dizziness, light-headedness and headaches.    Objective:   Physical Exam:  BP (!) 144/88 (BP Location: Left Arm, Patient Position: Sitting)   Pulse 90   Temp 98 F (36.7 C) (Tympanic)   Resp 15   Ht 6' (1.829 m)   Wt 198 lb 12.8 oz (90.2 kg)   SpO2 100%   BMI 26.96 kg/m  ECOG: 0  Physical Exam Constitutional:      General: He is not in acute distress.    Appearance: Normal  appearance. He is not ill-appearing, toxic-appearing or diaphoretic.  HENT:     Head: Normocephalic and atraumatic.     Mouth/Throat:     Mouth: Mucous membranes are moist.     Pharynx: Oropharynx is clear. No oropharyngeal exudate or posterior oropharyngeal erythema.  Neck:     Comments: The tissue of the neck appears to be consistent with lymphedema.  There is some thickened tissue consistent with radiation changes.  There are no masses, nodularity, or erythema.  No exudate is noted. Cardiovascular:     Rate and Rhythm: Normal rate and regular rhythm.     Heart sounds: No murmur heard. No friction rub. No gallop.   Pulmonary:     Effort: Pulmonary effort is normal. No respiratory distress.     Breath sounds: Normal breath sounds. No wheezing, rhonchi or rales.  Skin:    General: Skin is warm and dry.  Neurological:     Mental Status: He is alert.     Coordination: Coordination normal.     Gait: Gait normal.  Psychiatric:        Mood and Affect: Mood normal.        Behavior: Behavior normal.        Thought Content: Thought content normal.        Judgment: Judgment normal.     Lab Review:     Component Value Date/Time   NA 133 (L) 06/02/2020 1358   NA 141 09/19/2019 0950   K 4.2 06/02/2020 1358   CL 100 06/02/2020 1358   CO2 24 06/02/2020 1358   GLUCOSE 105 (H) 06/02/2020 1358   BUN 17 06/02/2020 1358   BUN 9 09/19/2019 0950   CREATININE 1.50 (H) 06/02/2020 1358   CALCIUM 9.3 06/02/2020 1358   PROT 6.4 (L) 06/02/2020 1358   PROT 6.6 09/19/2019 0950   ALBUMIN 3.8 06/02/2020 1358   ALBUMIN 4.3 09/19/2019 0950   AST 15 06/02/2020 1358   ALT 8 06/02/2020 1358   ALKPHOS 69 06/02/2020 1358   BILITOT 0.6 06/02/2020 1358   GFRNONAA 52 (L) 06/02/2020 1358   GFRAA 59 (L) 04/01/2020 0856       Component Value Date/Time   WBC 3.6 (L) 06/02/2020 1358   WBC 3.9 (L) 04/21/2020 1327   RBC 2.91 (L) 06/02/2020 1358   HGB 9.6 (L) 06/02/2020 1358   HGB 14.1 09/19/2019 0950    HCT 27.7 (L) 06/02/2020 1358   HCT 41.1 09/19/2019 0950   PLT 116 (L) 06/02/2020 1358   PLT 148 (L) 09/19/2019 0950   MCV 95.2 06/02/2020 1358   MCV 88 09/19/2019 0950   MCH 33.0 06/02/2020 1358   MCHC 34.7 06/02/2020 1358   RDW 13.1 06/02/2020 1358   RDW 14.0 09/19/2019 0950   LYMPHSABS 0.4 (L) 06/02/2020 1358   LYMPHSABS 1.4 09/19/2019 0950   MONOABS 0.2 06/02/2020 1358   EOSABS 0.1 06/02/2020 1358   EOSABS 0.1 09/19/2019 0950   BASOSABS 0.0 06/02/2020 1358   BASOSABS 0.0 09/19/2019 0950   -------------------------------  Imaging from last 24 hours (if applicable):  Radiology interpretation: No results found.

## 2020-06-29 ENCOUNTER — Inpatient Hospital Stay: Payer: 59

## 2020-06-29 ENCOUNTER — Ambulatory Visit: Payer: 59 | Attending: Medical | Admitting: Rehabilitation

## 2020-06-29 ENCOUNTER — Other Ambulatory Visit: Payer: Self-pay

## 2020-06-29 ENCOUNTER — Encounter: Payer: Self-pay | Admitting: Rehabilitation

## 2020-06-29 DIAGNOSIS — I890 Lymphedema, not elsewhere classified: Secondary | ICD-10-CM | POA: Insufficient documentation

## 2020-06-29 DIAGNOSIS — C099 Malignant neoplasm of tonsil, unspecified: Secondary | ICD-10-CM | POA: Insufficient documentation

## 2020-06-29 DIAGNOSIS — R293 Abnormal posture: Secondary | ICD-10-CM | POA: Insufficient documentation

## 2020-06-29 NOTE — Therapy (Signed)
[facility] [address] [place], [place], [address] Phone: [phone]   Fax:  [phone]  Physical Therapy Evaluation  Patient Details  Name: Omar Christian MRN: [id number] Date of Birth: December 16, 1955 Referring Provider (PT): Marga Hoots PA-C   Encounter Date: 06/29/2020   PT End of Session - 06/29/20 1252    Visit Number 1    Number of Visits 9    Date for PT Re-Evaluation 07/27/20    Authorization Type [employer]    PT Start Time 1100    PT Stop Time 1153    PT Time Calculation (min) 53 min    Activity Tolerance Patient tolerated treatment well    Behavior During Therapy [facility] for tasks assessed/performed           Past Medical History:  Diagnosis Date  . Abnormal findings on cardiac catheterization 09/30/15   [facility]  . Abnormal myocardial perfusion study 09/24/15   [facility]  . CAD, multiple vessel 09/30/15   per CATH @ [facility]/DR.PARACHOS  . Coronary artery disease   . GERD (gastroesophageal reflux disease)   . Gout   . H/O echocardiogram 09/24/15   [facility]  . Hard of hearing   . History of pneumonia   . Hyperlipidemia   . Hypertension   . Pneumonia   . Progressive angina (HCC)   . 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 CARDIAC CATHETERIZATION N/A 09/30/2015   Procedure: Left Heart Cath and Coronary Angiography;  Surgeon: Marcina Millard, MD;  Location: [facility] INVASIVE CV LAB;  Service: Cardiovascular;  Laterality: N/A;  . CORONARY ARTERY BYPASS GRAFT N/A 10/08/2015   Procedure: CORONARY ARTERY BYPASS GRAFTING time 4 using left internal mammary and right greater saphenous vein harvested by endovein;  Surgeon: Delight Ovens, MD;  Location: [facility] OR;  Service: Open Heart Surgery;  Laterality: N/A;  . CORONARY ARTERY BYPASS GRAFT    . IR GASTROSTOMY TUBE MOD SED  02/10/2020  . IR IMAGING GUIDED PORT INSERTION   02/10/2020  . MULTIPLE EXTRACTIONS WITH ALVEOLOPLASTY N/A 02/04/2020   Procedure: Extraction of tooth #'s [phone], and 32 with alveoloplasty and gross debridement of remaining teeth.;  Surgeon: Charlynne Pander, DDS;  Location: [facility] OR;  Service: Oral Surgery;  Laterality: N/A;  . TEE WITHOUT CARDIOVERSION N/A 10/08/2015   Procedure: TRANSESOPHAGEAL ECHOCARDIOGRAM (TEE);  Surgeon: Delight Ovens, MD;  Location: [facility] OR;  Service: Open Heart Surgery;  Laterality: N/A;  . VASECTOMY    . WISDOM TOOTH EXTRACTION      There were no vitals filed for this visit.    Subjective Assessment - 06/29/20 1059    Subjective Some swelling in the neck x about 1 month radiation has been done x 2 months    Patient is accompained by: Family member   daughter Inetta Fermo   Pertinent History Post chemoradiation for tonsillar cancer, HTN, history of CAD, very HOH    Patient Stated Goals what to do for the swelling    Currently in Pain? No/denies              [facility] PT Assessment - 06/29/20 0001      Assessment   Medical Diagnosis tonsillar cancer    Referring Provider (PT) Marga Hoots PA-C    Onset Date/Surgical Date 05/30/20    Hand Dominance Right    Prior Therapy pre consult  Precautions   Precaution Comments lymphedema      Restrictions   Weight Bearing Restrictions No      Balance Screen   Has the patient fallen in the past 6 months No    Has the patient had a decrease in activity level because of a fear of falling?  No    Is the patient reluctant to leave their home because of a fear of falling?  No      Home Environment   Living Environment Private residence    Living Arrangements Spouse/significant other    Available Help at Discharge Family      Prior Function   Level of Independence Independent    Vocation On disability;Retired   [employer] from [employer]   Overall Cognitive Status Within Functional Limits for tasks assessed      Observation/Other Assessments    Observations edema submentally, feeding tube in place, port still in place      Posture/Postural Control   Posture/Postural Control Postural limitations      ROM / Strength   AROM / PROM / Strength AROM      AROM   AROM Assessment Site Cervical    Cervical Flexion WNL    Cervical Extension 20    Cervical - Right Side Bend 45    Cervical - Left Side Bend 45    Cervical - Right Rotation 45    Cervical - Left Rotation 45      Palpation   Palpation comment no pitting noted      Ambulation/Gait   Gait Comments gait WNL             LYMPHEDEMA/ONCOLOGY QUESTIONNAIRE - 06/29/20 0001      Type   Cancer Type tonsillar      Treatment   Active Chemotherapy Treatment No    Past Chemotherapy Treatment Yes    Active Radiation Treatment No    Past Radiation Treatment Yes      Lymphedema Assessments   Lymphedema Assessments Head and Neck      Head and Neck   Right Corner of mouth to where ear lobe meets face 10.1 cm    Left Corner of mouth to where ear lobe meets face 10.1 cm    4 cm superior to sternal notch around neck 44.5 cm   baseline 44.3   6 cm superior to sternal notch around neck 45.2 cm   baseline 43.9   8 cm superior to sternal notch around neck 45.8 cm   baseline 45.8                  Objective measurements completed on examination: See above findings.       [facility] Adult PT Treatment/Exercise - 06/29/20 0001      Manual Therapy   Manual Therapy Edema management;Manual Lymphatic Drainage (MLD)    Edema Management pts daughter ordered Marena garment size Large in clinic, pt was given chip pack to tie over the head and educated on use of at least    Manual Lymphatic Drainage (MLD) performed in supine short neck, bil axillary nodes, pre and post auricular nodes, submental nodes, and then focus on anterior neck towards axilla.  Pt was educated to perform anterior neck milking for now at home                  PT Education - 06/29/20 1251     Education Details chip pack use  Person(s) Educated Patient;Child(ren)    Methods Explanation;Demonstration;Tactile cues;Verbal cues    Comprehension Verbalized understanding;Returned demonstration               PT Long Term Goals - 06/29/20 1258      PT LONG TERM GOAL #1   Title Pt will be ind with self stretches for the head and neck    Time 4    Period Weeks    Status Achieved      PT LONG TERM GOAL #2   Title Pt will obtain appropriate compression garment for the head and neck lymphedema    Time 4    Period Weeks    Status New      PT LONG TERM GOAL #3   Title Pt will be ind with self MLD for the anterior neck edema and educated on possibility of flexitouch if needed    Time 4    Period Weeks    Status New                  Plan - 06/29/20 1252    Clinical Impression Statement pt returns post chemoradiation due to tonsillar cancer with mild to moderate submental / anterior neck edema without pitting. Measurements up around 1-1.5cm from pre treatment.  Pt is very HOH and has to have daughter yell instruction to him so pt needs daughter assistance or loud instruction during treatment.  pt has already ordered a [place] and has a chip pack.  Will focus on self MLD education.  Discussed briefly flexitouch with daughter who will see how self MLD goes first.  Pt also has $100 copay when his insurance starts over but is also waiting to see about disability/medicare, so pt may be returning for one self MLD education visit for now if this is not figured out.    Personal Factors and Comorbidities Age;Comorbidity 2    Comorbidities radiation history, chemo therapy history    Stability/Clinical Decision Making Stable/Uncomplicated    Clinical Decision Making Low    Rehab Potential Good    PT Frequency 2x / week    PT Duration 4 weeks    PT Treatment/Interventions ADLs/Self Care Home Management;Therapeutic exercise;Manual lymph drainage;Patient/family education;Manual  techniques;Passive range of motion    PT Next Visit Plan any insurance info? one more visit vs more?  Focus on self MLD education, how was chip pack and garment? Add foam to garment, (pt HOH)    Consulted and Agree with Plan of Care Patient;Family member/caregiver           Patient will benefit from skilled therapeutic intervention in order to improve the following deficits and impairments:  Increased edema,Decreased range of motion  Visit Diagnosis: Abnormal posture  Malignant neoplasm of tonsil [facility])  Lymphedema     Problem List Patient Active Problem List   Diagnosis Date Noted  . Port-A-Cath in place 04/01/2020  . Fever   . Tachycardia   . Severe sepsis ([place]) 03/04/2020  . Patient on antineoplastic chemotherapy regimen   . Goals of care, counseling/discussion   . Palliative care by specialist   . DNR (do not resuscitate) discussion   . Sepsis ([place]) 03/02/2020  . Cancer of tonsil ([place]) 02/04/2020  . Poor dentition 02/04/2020  . 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[place]) 01/31/2020  . Tonsillar cancer ([place]) 01/27/2020  . 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 Unstable angina pectoris ([place]) 10/04/2015  . Progressive angina ([place])   . Abnormal findings on cardiac  catheterization 09/30/2015  . CAD, multiple vessel 09/30/2015  . Abnormal findings diagnostic imaging of heart and coronary circulation 09/30/2015  . CAD in native artery 09/30/2015  . H/O echocardiogram 09/24/2015  . Abnormal myocardial perfusion study 09/24/2015  . Chest pain 09/03/2015  . Allergic rhinitis 08/27/2015  . H/O: gout 08/27/2015  . HLD (hyperlipidemia) 08/27/2015  . Benign hypertension 08/27/2015  . Acid reflux 08/27/2015  . Essential (primary) hypertension 08/27/2015  . H/O disease 08/27/2015    Idamae Lusher 06/29/2020, 1:00 PM  [facility] [address] [place], [place], [address] Phone: [phone]   Fax:  [phone]  Name: Omar Christian MRN: [id number] Date of Birth: 1956/04/29

## 2020-06-29 NOTE — Progress Notes (Signed)
Nutrition  RD called daughter, Inetta Fermo for scheduled nutrition phone follow-up today at 9:45am.  No answer or option to leave voicemail as mailbox full.    RD called daughter again at 1:08pm and no answer or option to leave voicemail.   Emeri Estill B. Freida Busman, RD, LDN Registered Dietitian [phone] (mobile)

## 2020-07-06 ENCOUNTER — Encounter: Payer: Self-pay | Admitting: Physical Therapy

## 2020-07-06 ENCOUNTER — Ambulatory Visit: Payer: 59 | Attending: Medical | Admitting: Physical Therapy

## 2020-07-06 ENCOUNTER — Other Ambulatory Visit: Payer: Self-pay

## 2020-07-06 DIAGNOSIS — I890 Lymphedema, not elsewhere classified: Secondary | ICD-10-CM | POA: Insufficient documentation

## 2020-07-06 DIAGNOSIS — C099 Malignant neoplasm of tonsil, unspecified: Secondary | ICD-10-CM | POA: Diagnosis present

## 2020-07-06 DIAGNOSIS — R293 Abnormal posture: Secondary | ICD-10-CM | POA: Insufficient documentation

## 2020-07-06 NOTE — Therapy (Signed)
[place], [place], [address] Phone: [phone]   Fax:  [phone]  Physical Therapy Treatment  Patient Details  Name: Omar Christian MRN: [id number] Date of Birth: 1956/03/02 Referring Provider (PT): Sandi Mealy PA-C   Encounter Date: 07/06/2020   PT End of Session - 07/06/20 1409    Visit Number 2    Number of Visits 9    Date for PT Re-Evaluation 07/27/20    Authorization Type [employer]    PT Start Time 1255    PT Stop Time 1355    PT Time Calculation (min) 60 min    Activity Tolerance Patient tolerated treatment well    Behavior During Therapy [facility] for tasks assessed/performed           Past Medical History:  Diagnosis Date  . Abnormal findings on cardiac catheterization 09/30/15   [facility]  . Abnormal myocardial perfusion study 09/24/15   [facility]  . CAD, multiple vessel 09/30/15   per CATH @ [facility]/DR.PARACHOS  . Coronary artery disease   . GERD (gastroesophageal reflux disease)   . Gout   . H/O echocardiogram 09/24/15   [facility]  . Hard of hearing   . History of pneumonia   . Hyperlipidemia   . Hypertension   . Pneumonia   . Progressive angina ([place])   . 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 CARDIAC CATHETERIZATION N/A 09/30/2015   Procedure: Left Heart Cath and Coronary Angiography;  Surgeon: [name], MD;  Location: [place] CV LAB;  Service: Cardiovascular;  Laterality: N/A;  . CORONARY ARTERY BYPASS GRAFT N/A 10/08/2015   Procedure: CORONARY ARTERY BYPASS GRAFTING time 4 using left internal mammary and right greater saphenous vein harvested by endovein;  Surgeon: [name], MD;  Location: [place];  Service: Open Heart Surgery;  Laterality: N/A;  . CORONARY ARTERY BYPASS GRAFT    . IR GASTROSTOMY TUBE MOD SED  02/10/2020  . IR IMAGING GUIDED PORT INSERTION  02/10/2020   . MULTIPLE EXTRACTIONS WITH ALVEOLOPLASTY N/A 02/04/2020   Procedure: Extraction of tooth #'s [id number], and 32 with alveoloplasty and gross debridement of remaining teeth.;  Surgeon: [name], DDS;  Location: [place];  Service: Oral Surgery;  Laterality: N/A;  . TEE WITHOUT CARDIOVERSION N/A 10/08/2015   Procedure: TRANSESOPHAGEAL ECHOCARDIOGRAM (TEE);  Surgeon: [name], MD;  Location: [place];  Service: Open Heart Surgery;  Laterality: N/A;  . VASECTOMY    . WISDOM TOOTH EXTRACTION      There were no vitals filed for this visit.   Subjective Assessment - 07/06/20 1405    Subjective I feel like my swelling is not as wide as it was.    Pertinent History Post chemoradiation for tonsillar cancer, HTN, history of CAD, very HOH    Patient Stated Goals what to do for the swelling    Currently in Pain? No/denies                             [facility] Adult PT Treatment/Exercise - 07/06/20 0001      Exercises   Exercises Other Exercises    Other Exercises  reviewed head and neck exercises issued at last appointment and also given to pt at head and neck clinic since pt reported that he did not understand how to  do them. Had pt return demonstrate all exercises to therapist      Manual Therapy   Edema Management pt brought in [place] garment that he ordered, assessed this for fit and added 1/2 grey foam to anterior neck for additional compression    Manual Lymphatic Drainage (MLD) instructed pt and pt's daugther in self MLD for neck lymphedema using the [place] head and neck handout anterior approach. Pt had increased difficulty demonstrating correct skin stretch technique despite verbal and tactile cueing. Pt to bring wife to next session for instruction as well.  SInce pt was unable to demonstrate correct technique educated pt to do steps 1-8 for lymph node stimulation and then just stretch skin downwards on anterior neck.                       PT Long  Term Goals - 06/29/20 1258      PT LONG TERM GOAL #1   Title Pt will be ind with self stretches for the head and neck    Time 4    Period Weeks    Status Achieved      PT LONG TERM GOAL #2   Title Pt will obtain appropriate compression garment for the head and neck lymphedema    Time 4    Period Weeks    Status New      PT LONG TERM GOAL #3   Title Pt will be ind with self MLD for the anterior neck edema and educated on possibility of flexitouch if needed    Time 4    Period Weeks    Status New                 Plan - 07/06/20 1410    Clinical Impression Statement Pt reports he has been doing self MLD by stretching skin downwards at front of neck and has been wearing Marena that he ordered. He can tell that his swelling is improving and is more centralized now. Added 1/2 grey foam to anterior neck in [facility] for additional compression. Pt had difficulty today learning full self MLD sequence using the [facility] anterior approach handout. Pt unable to return demonstrate correct technique. Educated pt to continue to wear marena with 1/2 grey foam added and to do lymph node stimulation steps on handout but just to focus on stretching skin on anterior neck downwards for self MLD. Sending demo graphic info to [employer] compression company per pt request since pt is having trouble learning self MLD. Pt will bring his wife to next appt to learn self MLD.    PT Frequency 2x / week    PT Duration 4 weeks    PT Treatment/Interventions ADLs/Self Care Home Management;Therapeutic exercise;Manual lymph drainage;Patient/family education;Manual techniques;Passive range of motion    PT Next Visit Plan teach pt's wife self MLD, see if pump will be covered with [employer]- demographics and note sent to [employer] and Agree with Plan of Care Patient;Family member/caregiver    Family Member Consulted daughter           Patient will benefit from skilled therapeutic intervention in order to improve  the following deficits and impairments:  Increased edema,Decreased range of motion  Visit Diagnosis: Lymphedema     Problem List Patient Active Problem List   Diagnosis Date Noted  . Port-A-Cath in place 04/01/2020  . Fever   . Tachycardia   . Severe sepsis (HCC) 03/04/2020  . Patient on antineoplastic chemotherapy  regimen   . Goals of care, counseling/discussion   . Palliative care by specialist   . DNR (do not resuscitate) discussion   . Sepsis ([place]) 03/02/2020  . Cancer of tonsil ([place]) 02/04/2020  . Poor dentition 02/04/2020  . 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[place]) 01/31/2020  . Tonsillar cancer ([place]) 01/27/2020  . 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 Unstable angina pectoris ([place]) 10/04/2015  . Progressive angina ([place])   . Abnormal findings on cardiac catheterization 09/30/2015  . CAD, multiple vessel 09/30/2015  . Abnormal findings diagnostic imaging of heart and coronary circulation 09/30/2015  . CAD in native artery 09/30/2015  . H/O echocardiogram 09/24/2015  . Abnormal myocardial perfusion study 09/24/2015  . Chest pain 09/03/2015  . Allergic rhinitis 08/27/2015  . H/O: gout 08/27/2015  . HLD (hyperlipidemia) 08/27/2015  . Benign hypertension 08/27/2015  . Acid reflux 08/27/2015  . Essential (primary) hypertension 08/27/2015  . H/O disease 08/27/2015    Allyson Sabal [facility] 07/06/2020, 2:14 PM  [place], [place], [address] Phone: [phone]   Fax:  [phone]  Name: Albara Scorza MRN: [id number] Date of Birth: 1956/05/13  Manus Gunning, PT 07/06/20 2:14 PM

## 2020-07-09 ENCOUNTER — Telehealth: Payer: Self-pay | Admitting: *Deleted

## 2020-07-09 NOTE — Telephone Encounter (Signed)
Called patient to inform of Pet Scan for 07-27-20- arrival time- 8 am @ [place], pt. to have water only after midnight, patient to have fu appt. with Dr. Isidore Moos on 07-28-20 @ 11:40 am, spoke with patient's daughter [name] and she is aware of these appts.

## 2020-07-13 ENCOUNTER — Other Ambulatory Visit: Payer: Self-pay

## 2020-07-13 ENCOUNTER — Ambulatory Visit: Payer: 59 | Admitting: Rehabilitation

## 2020-07-13 ENCOUNTER — Encounter: Payer: Self-pay | Admitting: Rehabilitation

## 2020-07-13 DIAGNOSIS — I890 Lymphedema, not elsewhere classified: Secondary | ICD-10-CM | POA: Diagnosis not present

## 2020-07-13 DIAGNOSIS — C099 Malignant neoplasm of tonsil, unspecified: Secondary | ICD-10-CM

## 2020-07-13 DIAGNOSIS — R293 Abnormal posture: Secondary | ICD-10-CM

## 2020-07-13 NOTE — Therapy (Signed)
[place], [place], [address] Phone: [phone]   Fax:  [phone]  Physical Therapy Treatment  Patient Details  Name: Omar Christian MRN: [id number] Date of Birth: 15-Apr-1956 Referring Provider (PT): Sandi Mealy PA-C   Encounter Date: 07/13/2020   PT End of Session - 07/13/20 1600    Visit Number 3    Number of Visits 9    Date for PT Re-Evaluation 07/27/20    PT Start Time 1400    PT Stop Time 1454    PT Time Calculation (min) 54 min    Activity Tolerance Patient tolerated treatment well    Behavior During Therapy [facility] for tasks assessed/performed           Past Medical History:  Diagnosis Date  . Abnormal findings on cardiac catheterization 09/30/15   [facility]  . Abnormal myocardial perfusion study 09/24/15   [facility]  . CAD, multiple vessel 09/30/15   per CATH @ [facility]/DR.PARACHOS  . Coronary artery disease   . GERD (gastroesophageal reflux disease)   . Gout   . H/O echocardiogram 09/24/15   [place]  . Hard of hearing   . History of pneumonia   . Hyperlipidemia   . Hypertension   . Pneumonia   . Progressive angina ([place])   . 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 CARDIAC CATHETERIZATION N/A 09/30/2015   Procedure: Left Heart Cath and Coronary Angiography;  Surgeon: [name], MD;  Location: [place] CV [facility];  Service: Cardiovascular;  Laterality: N/A;  . CORONARY ARTERY BYPASS GRAFT N/A 10/08/2015   Procedure: CORONARY ARTERY BYPASS GRAFTING time 4 using left internal mammary and right greater saphenous vein harvested by endovein;  Surgeon: [name], MD;  Location: [place];  Service: Open Heart Surgery;  Laterality: N/A;  . CORONARY ARTERY BYPASS GRAFT    . IR GASTROSTOMY TUBE MOD SED  02/10/2020  . IR IMAGING GUIDED PORT INSERTION  02/10/2020  . MULTIPLE EXTRACTIONS WITH  ALVEOLOPLASTY N/A 02/04/2020   Procedure: Extraction of tooth #'s [phone], and 32 with alveoloplasty and gross debridement of remaining teeth.;  Surgeon: [name], DDS;  Location: [place];  Service: Oral Surgery;  Laterality: N/A;  . TEE WITHOUT CARDIOVERSION N/A 10/08/2015   Procedure: TRANSESOPHAGEAL ECHOCARDIOGRAM (TEE);  Surgeon: [name], MD;  Location: [place];  Service: Open Heart Surgery;  Laterality: N/A;  . VASECTOMY    . WISDOM TOOTH EXTRACTION      There were no vitals filed for this visit.   Subjective Assessment - 07/13/20 1353    Subjective Wife reports it does not look any less shaky.  I am using my foam every day.  They called me about the pump.  They are checking on the insurance.    Pertinent History Post chemoradiation for tonsillar cancer, HTN, history of CAD, very HOH    Currently in Pain? No/denies                             [facility] Adult PT Treatment/Exercise - 07/13/20 0001      Manual Therapy   Edema Management pt was given a small schneider pack made of cellona foam with chip foam stuck to it and then covered by paper tape to use for slight fibrosis.  Pt was educated on used chip  pack when doing chest clearance and then removing the garment for neck MLD and then replacing the garment with flat foam piece for 3-4 hours.    Manual Lymphatic Drainage (MLD) reviewed self MLD for the neck in seated in front of the mirror with education also to the spouse.  Pt has not performed since last sessions and had difficulty performing steps even with vcs/tcs/pictures.  Wife is hopefuly the pump will be approved through insurance but they have not heard back yet. Then performed in supine HOB elevated; short neck, bil axillary nodes, and focus on anterior neck and submental region.                       PT Long Term Goals - 06/29/20 1258      PT LONG TERM GOAL #1   Title Pt will be ind with self stretches for the head and neck     Time 4    Period Weeks    Status Achieved      PT LONG TERM GOAL #2   Title Pt will obtain appropriate compression garment for the head and neck lymphedema    Time 4    Period Weeks    Status New      PT LONG TERM GOAL #3   Title Pt will be ind with self MLD for the anterior neck edema and educated on possibility of flexitouch if needed    Time 4    Period Weeks    Status New                 Plan - 07/13/20 1601    Clinical Impression Statement Pt reports compliance with garment use but not MLD.  Pt and wife are hopeful about the pump but have not heard back about coverage yet.  Focused on steps of self MLD pointing out wording and then number on diagram with pt performing each step.  Pt able to perform steps with moderate cueing/feedback improved from last visit but still unable to verbalize mainly due to [facility] status.  Pt will return in 2 weeks to see if the pump will be happening vs needing to review more self MLD and will hopefully bring his garment.    PT Frequency 2x / week    PT Duration 4 weeks    PT Treatment/Interventions ADLs/Self Care Home Management;Therapeutic exercise;Manual lymph drainage;Patient/family education;Manual techniques;Passive range of motion    PT Next Visit Plan pump update?  pump vs more self MLD education? how is garment?  measure neck to check progress.  MLD as neeed    Consulted and Agree with Plan of Care Patient;Family member/caregiver    Family Member Consulted wife           Patient will benefit from skilled therapeutic intervention in order to improve the following deficits and impairments:     Visit Diagnosis: Lymphedema  Abnormal posture  Malignant neoplasm of tonsil [facility])     Problem List Patient Active Problem List   Diagnosis Date Noted  . Port-A-Cath in place 04/01/2020  . Fever   . Tachycardia   . Severe sepsis ([place]) 03/04/2020  . Patient on antineoplastic chemotherapy regimen   . Goals of care,  counseling/discussion   . Palliative care by specialist   . DNR (do not resuscitate) discussion   . Sepsis ([place]) 03/02/2020  . Cancer of tonsil ([place]) 02/04/2020  . Poor dentition 02/04/2020  . 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[place]) 01/31/2020  .  Tonsillar cancer ([place]) 01/27/2020  . 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 Unstable angina pectoris ([place]) 10/04/2015  . Progressive angina ([place])   . Abnormal findings on cardiac catheterization 09/30/2015  . CAD, multiple vessel 09/30/2015  . Abnormal findings diagnostic imaging of heart and coronary circulation 09/30/2015  . CAD in native artery 09/30/2015  . H/O echocardiogram 09/24/2015  . Abnormal myocardial perfusion study 09/24/2015  . Chest pain 09/03/2015  . Allergic rhinitis 08/27/2015  . H/O: gout 08/27/2015  . HLD (hyperlipidemia) 08/27/2015  . Benign hypertension 08/27/2015  . Acid reflux 08/27/2015  . Essential (primary) hypertension 08/27/2015  . H/O disease 08/27/2015    Stark Bray 07/13/2020, [place], [place], [address] Phone: [phone]   Fax:  [phone]  Name: Omar Christian MRN: [id number] Date of Birth: 05/07/1956

## 2020-07-20 ENCOUNTER — Ambulatory Visit: Payer: 59 | Admitting: Radiation Oncology

## 2020-07-20 ENCOUNTER — Ambulatory Visit: Payer: 59

## 2020-07-27 ENCOUNTER — Ambulatory Visit
Admission: RE | Admit: 2020-07-27 | Discharge: 2020-07-27 | Disposition: A | Discharge status: Home / Self Care | Payer: 59 | Source: Ambulatory Visit | Attending: Radiation Oncology | Admitting: Radiation Oncology

## 2020-07-27 ENCOUNTER — Encounter: Payer: Self-pay | Admitting: Physical Therapy

## 2020-07-27 ENCOUNTER — Other Ambulatory Visit: Payer: Self-pay

## 2020-07-27 ENCOUNTER — Ambulatory Visit: Payer: 59 | Admitting: Physical Therapy

## 2020-07-27 DIAGNOSIS — C099 Malignant neoplasm of tonsil, unspecified: Secondary | ICD-10-CM | POA: Diagnosis not present

## 2020-07-27 DIAGNOSIS — R590 Localized enlarged lymph nodes: Secondary | ICD-10-CM | POA: Diagnosis present

## 2020-07-27 DIAGNOSIS — I890 Lymphedema, not elsewhere classified: Secondary | ICD-10-CM | POA: Diagnosis not present

## 2020-07-27 DIAGNOSIS — R293 Abnormal posture: Secondary | ICD-10-CM

## 2020-07-27 LAB — GLUCOSE, CAPILLARY: Glucose-Capillary: 100 mg/dL — ABNORMAL HIGH (ref 70–99)

## 2020-07-27 MED ORDER — FLUDEOXYGLUCOSE F - 18 (FDG) INJECTION
10.5800 | Freq: Once | INTRAVENOUS | Status: AC | PRN
  Administered 2020-07-27: 10.58 via INTRAVENOUS

## 2020-07-27 NOTE — Progress Notes (Signed)
Mr. Hackler presents today for follow-up after completing radiation to his head/neck and right tonsil on 04/12/2020, and to review PET scan results from 07/27/2020  Pain issues, if any: Patient denies Using a feeding tube?: No--trying to take all nutrition by mouth. He is interested in having PEG removed, but worried about pain of procedure Weight changes, if any: Wt Readings from Last 3 Encounters:  07/28/20 197 lb 4 oz (89.5 kg)  06/14/20 198 lb 12.8 oz (90.2 kg)  06/02/20 196 lb 11.2 oz (89.2 kg)   Swallowing issues, if any: Overall patient denies. He still struggles with drier food, but states he is eating a variety of foods. States as long as food is soft or has a sauce/gravy, he doesn't have any issues. Denies any difficulty with thin liquids.  Smoking or chewing tobacco? None Using fluoride trays daily? Uses fluoride toothpaste--has not had F/U with regular dentist yet Last ENT visit was on: Not since diagnosis Other notable issues, if any: Patinet denies any jaw pain and reports just an occasional sharp pain to his right ear (states it resolves quickly). He is seeing PT regularly for lymphedema management. Reports he's up frequently during the night to urinate, but otherwise sleeping well. Reports slight improvement in thick saliva, but states issue is still present. Sense of taste is improving but still struggles with dry mouth. Denies any mouth sores or ulcers. Reports his energy level has improved, and overall he is pleased with his recovery so far  Vitals:   07/28/20 1151  BP: (!) 155/89  Pulse: 83  Resp: 18  Temp: 97.6 F (36.4 C)  SpO2: 100%

## 2020-07-27 NOTE — Therapy (Signed)
[place], [place], [address] Phone: [phone]   Fax:  [phone]  Physical Therapy Treatment  Patient Details  Name: Omar Christian MRN: [id number] Date of Birth: 01-22-1956 Referring Provider (PT): Sandi Mealy PA-C   Encounter Date: 07/27/2020   PT End of Session - 07/27/20 1156    Visit Number 4    Number of Visits 9    Date for PT Re-Evaluation 08/24/20    Authorization Type [employer]    PT Start Time 1110    PT Stop Time 1156    PT Time Calculation (min) 46 min    Activity Tolerance Patient tolerated treatment well    Behavior During Therapy [facility] for tasks assessed/performed           Past Medical History:  Diagnosis Date  . Abnormal findings on cardiac catheterization 09/30/15   [facility]  . Abnormal myocardial perfusion study 09/24/15   [facility]  . CAD, multiple vessel 09/30/15   per CATH @ [facility]/DR.PARACHOS  . Coronary artery disease   . GERD (gastroesophageal reflux disease)   . Gout   . H/O echocardiogram 09/24/15   [place]  . Hard of hearing   . History of pneumonia   . Hyperlipidemia   . Hypertension   . Pneumonia   . Progressive angina ([place])   . Tonsil cancer (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 CARDIAC CATHETERIZATION N/A 09/30/2015   Procedure: Left Heart Cath and Coronary Angiography;  Surgeon: [name], MD;  Location: [place] [facility];  Service: Cardiovascular;  Laterality: N/A;  . CORONARY ARTERY BYPASS GRAFT N/A 10/08/2015   Procedure: CORONARY ARTERY BYPASS GRAFTING time 4 using left internal mammary and right greater saphenous vein harvested by endovein;  Surgeon: [name], MD;  Location: [place];  Service: Open Heart Surgery;  Laterality: N/A;  . CORONARY ARTERY BYPASS GRAFT    . IR GASTROSTOMY TUBE MOD SED  02/10/2020  . IR IMAGING GUIDED PORT INSERTION   02/10/2020  . MULTIPLE EXTRACTIONS WITH ALVEOLOPLASTY N/A 02/04/2020   Procedure: Extraction of tooth #'s [phone], and 32 with alveoloplasty and gross debridement of remaining teeth.;  Surgeon: [name], DDS;  Location: [place];  Service: Oral Surgery;  Laterality: N/A;  . TEE WITHOUT CARDIOVERSION N/A 10/08/2015   Procedure: TRANSESOPHAGEAL ECHOCARDIOGRAM (TEE);  Surgeon: [name], MD;  Location: [place];  Service: Open Heart Surgery;  Laterality: N/A;  . VASECTOMY    . WISDOM TOOTH EXTRACTION      There were no vitals filed for this visit.   Subjective Assessment - 07/27/20 1111    Subjective I want [name] to do the massage.    Pertinent History Post chemoradiation for tonsillar cancer, HTN, history of CAD, very HOH    Patient Stated Goals what to do for the swelling    Currently in Pain? No/denies                             [facility] Adult PT Treatment/Exercise - 07/27/20 0001      Manual Therapy   Edema Management educated pt on correct skin technique for stretching skin at anterior neck in downwards direction since pt is having difficulty with entire MLD sequence    Manual Lymphatic Drainage (MLD) performed in supine: short neck, bil axillary nodes,  sternal nodes, clavicular nodes, posterior, lateral and anterior neck then retracing all steps                  PT Education - 07/27/20 1158    Education Details importance of performing self MLD daily and why the compression pump is important, how to stretch skin at anterior neck instead of sliding over skin for self MLD    Person(s) Educated Patient;Child(ren)    Methods Explanation;Demonstration;Tactile cues    Comprehension Verbalized understanding;Returned demonstration               PT Long Term Goals - 07/27/20 1155      PT LONG TERM GOAL #1   Title Pt will be ind with self stretches for the head and neck    Time 4    Period Weeks    Status Achieved      PT LONG TERM GOAL  #2   Title Pt will obtain appropriate compression garment for the head and neck lymphedema    Baseline 07/27/20- pt has obtained [place] head and neck compression garment and has been compliant with wearing it    Time 4    Period Weeks    Status Achieved      PT LONG TERM GOAL #3   Title Pt will be ind with self MLD for the anterior neck edema and educated on possibility of flexitouch if needed    Baseline 07/27/20- pt is still requring max cueing for this    Time 4    Period Weeks    Status On-going      PT LONG TERM GOAL #4   Title Pt will receive trial of FlexiTouch compression pump for long term management of neck lymphedema.    Time 4    Period Weeks    Status New    Target Date 08/24/20                 Plan - 07/27/20 1159    Clinical Impression Statement Pt returns to PT and reports his neck swelling is decreasing. He has not been compliant with entire MLD sequence but has been attempting self MLD by rubbing skin downward at anterior neck. Educated pt that it is important to get a good skin stretch and to use less pressure. Pt return demonstrated this. Reached out to compression pump company and they are awaiting approval from [employer]. Pt would benefit from additional skilled PT visits to continue to decrease anterior neck swelling and ensure pt is able to manage his lymphedema either with self MLD for a compression pump in addition to compression garments.    PT Frequency 2x / week    PT Duration 4 weeks    PT Treatment/Interventions ADLs/Self Care Home Management;Therapeutic exercise;Manual lymph drainage;Patient/family education;Manual techniques;Passive range of motion    PT Next Visit Plan pump update?  pump vs more self MLD education? how is garment?  measure neck to check progress.  MLD as neeed    Consulted and Agree with Plan of Care Patient;Family member/caregiver           Patient will benefit from skilled therapeutic intervention in order to  improve the following deficits and impairments:  Increased edema,Decreased range of motion  Visit Diagnosis: Lymphedema  Abnormal posture  Malignant neoplasm of tonsil [facility])     Problem List Patient Active Problem List   Diagnosis Date Noted  . Port-A-Cath in place 04/01/2020  . Fever   . Tachycardia   .  Severe sepsis ([place]) 03/04/2020  . Patient on antineoplastic chemotherapy regimen   . Goals of care, counseling/discussion   . Palliative care by specialist   . DNR (do not resuscitate) discussion   . Sepsis ([place]) 03/02/2020  . Cancer of tonsil ([place]) 02/04/2020  . Poor dentition 02/04/2020  . 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[place]) 01/31/2020  . Tonsillar cancer ([place]) 01/27/2020  . 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 Unstable angina pectoris ([place]) 10/04/2015  . Progressive angina ([place])   . Abnormal findings on cardiac catheterization 09/30/2015  . CAD, multiple vessel 09/30/2015  . Abnormal findings diagnostic imaging of heart and coronary circulation 09/30/2015  . CAD in native artery 09/30/2015  . H/O echocardiogram 09/24/2015  . Abnormal myocardial perfusion study 09/24/2015  . Chest pain 09/03/2015  . Allergic rhinitis 08/27/2015  . H/O: gout 08/27/2015  . HLD (hyperlipidemia) 08/27/2015  . Benign hypertension 08/27/2015  . Acid reflux 08/27/2015  . Essential (primary) hypertension 08/27/2015  . H/O disease 08/27/2015    Allyson Sabal [facility] 07/27/2020, 12:05 PM  [place], [place], [address] Phone: [phone]   Fax:  [phone]  Name: ARLIS GAYLER MRN: [id number] Date of Birth: 31-Mar-1956  Manus Gunning, PT 07/27/20 12:05 PM

## 2020-07-28 ENCOUNTER — Inpatient Hospital Stay: Payer: 59 | Attending: Hematology and Oncology

## 2020-07-28 ENCOUNTER — Other Ambulatory Visit: Payer: Self-pay

## 2020-07-28 ENCOUNTER — Ambulatory Visit
Admission: RE | Admit: 2020-07-28 | Discharge: 2020-07-28 | Disposition: A | Discharge status: Home / Self Care | Payer: 59 | Source: Ambulatory Visit | Attending: Radiation Oncology | Admitting: Radiation Oncology

## 2020-07-28 VITALS — BP 155/89 | HR 83 | Temp 97.6°F | Resp 18 | Ht 72.0 in | Wt 197.2 lb

## 2020-07-28 DIAGNOSIS — Z23 Encounter for immunization: Secondary | ICD-10-CM | POA: Diagnosis present

## 2020-07-28 DIAGNOSIS — C098 Malignant neoplasm of overlapping sites of tonsil: Secondary | ICD-10-CM | POA: Diagnosis present

## 2020-07-28 DIAGNOSIS — Z931 Gastrostomy status: Secondary | ICD-10-CM | POA: Diagnosis not present

## 2020-07-28 DIAGNOSIS — C099 Malignant neoplasm of tonsil, unspecified: Secondary | ICD-10-CM

## 2020-07-28 DIAGNOSIS — I890 Lymphedema, not elsewhere classified: Secondary | ICD-10-CM | POA: Insufficient documentation

## 2020-07-28 DIAGNOSIS — Z79899 Other long term (current) drug therapy: Secondary | ICD-10-CM | POA: Insufficient documentation

## 2020-07-28 DIAGNOSIS — Z7952 Long term (current) use of systemic steroids: Secondary | ICD-10-CM | POA: Diagnosis not present

## 2020-07-28 DIAGNOSIS — R682 Dry mouth, unspecified: Secondary | ICD-10-CM | POA: Insufficient documentation

## 2020-07-28 DIAGNOSIS — Z85819 Personal history of malignant neoplasm of unspecified site of lip, oral cavity, and pharynx: Secondary | ICD-10-CM | POA: Insufficient documentation

## 2020-07-28 NOTE — Progress Notes (Signed)
   Covid-19 Vaccination Clinic  Name:  Omar Christian    MRN: [id number] DOB: 1955-09-08  07/28/2020  Mr. [name] was observed post Covid-19 immunization for 15 minutes without incident. He was provided with Vaccine Information Sheet and instruction to access the V-Safe system.   Mr. [name] was instructed to call 911 with any severe reactions post vaccine: Marland Kitchen Difficulty breathing  . Swelling of face and throat  . A fast heartbeat  . A bad rash all over body  . Dizziness and weakness   Immunizations Administered    Name Date Dose VIS Date Route   Pfizer COVID-19 Vaccine 07/28/2020 12:52 PM 0.3 mL 04/21/2020 Intramuscular   Manufacturer: [place]   Lot: [id number]   NDC: [address]-0

## 2020-07-30 ENCOUNTER — Other Ambulatory Visit: Payer: Self-pay | Admitting: Family Medicine

## 2020-07-30 ENCOUNTER — Encounter: Payer: Self-pay | Admitting: Radiation Oncology

## 2020-07-30 ENCOUNTER — Telehealth: Payer: Self-pay | Admitting: Hematology and Oncology

## 2020-07-30 DIAGNOSIS — E790 Hyperuricemia without signs of inflammatory arthritis and tophaceous disease: Secondary | ICD-10-CM

## 2020-07-30 NOTE — Progress Notes (Signed)
Radiation Oncology         [phone] ________________________________  Name: Omar Christian MRN: [id number]  Date: 07/28/2020  DOB: 12-29-1955  Follow-Up Visit Note  CC: Chrismon, Janeice Robinson, MD  Diagnosis and Prior Radiotherapy:       ICD-10-CM   1. 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 [facility]) Staging form: Pharynx - HPV-Mediated Oropharynx, AJCC 8th Edition - Clinical stage from 01/27/2020: Stage III (cT4, cN1, cM0, p16+) - Signed by [name], MD on 01/31/2020   Radiation Treatment Dates: 02/18/2020 through 04/12/2020 Site Technique Total Dose (Gy) Dose per Fx (Gy) Completed Fx Beam Energies  Tonsil, Right: HN_Rt_tonsil IMRT 70/70 2 35/35 6X   CHIEF COMPLAINT:  Here for follow-up and surveillance of throat cancer  Narrative:    Omar Christian presents today for follow-up after completing radiation to his head/neck and right tonsil on 04/12/2020, and to review PET scan results from 07/27/2020  Pain issues, if any: Patient denies Using a feeding tube?: No--trying to take all nutrition by mouth. He is interested in having PEG removed, but worried about pain of procedure Weight changes, if any: Wt Readings from Last 3 Encounters:  07/28/20 197 lb 4 oz (89.5 kg)  06/14/20 198 lb 12.8 oz (90.2 kg)  06/02/20 196 lb 11.2 oz (89.2 kg)   Swallowing issues, if any: Overall patient denies. He still struggles with drier food, but states he is eating a variety of foods. States as long as food is soft or has a sauce/gravy, he doesn't have any issues. Denies any difficulty with thin liquids.  Smoking or chewing tobacco? None Using fluoride trays daily? Uses fluoride toothpaste--has not had F/U with regular dentist yet Last ENT visit was on: Not since diagnosis Other notable issues, if any: Patient denies any jaw pain and reports just an occasional sharp pain to his right ear (states it resolves  quickly). He is seeing PT regularly for lymphedema management. Reports he's up frequently during the night to urinate, but otherwise sleeping well. Reports slight improvement in thick saliva, but states issue is still present. Sense of taste is improving but still struggles with dry mouth. Denies any mouth sores or ulcers. Reports his energy level has improved, and overall he is pleased with his recovery so far   He is due for his Covid booster shot.   Vitals:   07/28/20 1151  BP: (!) 155/89  Pulse: 83  Resp: 18  Temp: 97.6 F (36.4 C)  SpO2: 100%                      ALLERGIES:  has No Known Allergies.  Meds: Current Outpatient Medications  Medication Sig Dispense Refill  . allopurinol (ZYLOPRIM) 100 MG tablet Take 1 tablet (100 mg total) by mouth daily. (Patient not taking: Reported on 07/28/2020) 30 tablet 12  . atorvastatin (LIPITOR) 20 MG tablet Take 1 tablet (20 mg total) by mouth daily. (Patient not taking: Reported on 07/28/2020) 90 tablet 3  . dexamethasone (DECADRON) 4 MG tablet Take 2 tablets (8 mg total) by mouth daily. Take daily for 3 days after chemo. Take with food. 20 tablet 0  . guaiFENesin (ROBITUSSIN) 100 MG/5ML liquid Take 20 mLs (400 mg total) by mouth 3 (three) times daily as needed for cough. May loosen secretions. (Patient not taking: Reported on 07/28/2020) 1000 mL 3  . lidocaine (  XYLOCAINE) 2 % solution Patient: Mix 1part 2% viscous lidocaine, 1part H20. Swish & swallow 63mL of diluted mixture, 19min before eating and at bedtime, up to 5xs daily. 200 mL 4  . lidocaine-prilocaine (EMLA) cream Apply to affected area once 30 g 3  . LORazepam (ATIVAN) 0.5 MG tablet Take 1 tablet (0.5 mg total) by mouth at bedtime as needed (Nausea or vomiting). 30 tablet 0  . magic mouthwash w/lidocaine SOLN Take 5 mLs by mouth 5 (five) times daily as needed for mouth pain. 240 mL 1  . metoCLOPramide (REGLAN) 10 MG/10ML SOLN Take 5 mLs (5 mg total) by mouth 3 (three) times daily before  meals. 1200 mL 0  . metoprolol succinate (TOPROL XL) 25 MG 24 hr tablet Take 1 tablet (25 mg total) by mouth daily. (Patient not taking: Reported on 07/28/2020) 30 tablet 3  . MITIGARE 0.6 MG CAPS Take 1 capsule by mouth daily. (Patient not taking: Reported on 07/28/2020)    . Nutritional Supplements (KATE FARMS STANDARD 1.4) LIQD 488 mLs by Gastrostomy Tube route 4 (four) times daily. (Patient not taking: Reported on 07/28/2020) 2730 mL 6  . nystatin (MYCOSTATIN) 100000 UNIT/ML suspension TAKE 5 MLS (500,000 UNITS TOTAL) BY MOUTH IN THE MORNING, AT NOON, AND AT BEDTIME. 60 mL 0  . ondansetron (ZOFRAN) 8 MG tablet Take 1 tablet (8 mg total) by mouth 2 (two) times daily as needed. Start on the third day after chemotherapy. 30 tablet 1  . oxyCODONE-acetaminophen (PERCOCET/ROXICET) 5-325 MG tablet Take 1 tablet by mouth every 4 (four) hours as needed.     . prochlorperazine (COMPAZINE) 10 MG tablet Take 1 tablet (10 mg total) by mouth every 6 (six) hours as needed (Nausea or vomiting). 30 tablet 1  . scopolamine (TRANSDERM-SCOP) 1 MG/3DAYS Place 1 patch (1.5 mg total) onto the skin every 3 (three) days. 10 patch 2  . sodium fluoride (PREVIDENT 5000 PLUS) 1.1 % CREA dental cream Apply to tooth brush. Brush teeth for 2 minutes. Spit out excess-DO NOT swallow. DO NOT rinse afterwards. Repeat nightly. (Patient not taking: Reported on 07/28/2020) 51 g prn   No current facility-administered medications for this encounter.    Physical Findings: The patient is in no acute distress. Patient is alert and oriented. Wt Readings from Last 3 Encounters:  07/28/20 197 lb 4 oz (89.5 kg)  06/14/20 198 lb 12.8 oz (90.2 kg)  06/02/20 196 lb 11.2 oz (89.2 kg)    height is 6' (1.829 m) and weight is 197 lb 4 oz (89.5 kg). His temporal temperature is 97.6 F (36.4 C). His blood pressure is 155/89 (abnormal) and his pulse is 83. His respiration is 18 and oxygen saturation is 100%. .  General: Alert and oriented, in no  acute distress HEENT: Head is normocephalic. Extraocular movements are intact. Oropharynx is notable no thrush, no suspicious lesions.  Tongue deviates to the right.  He is still very hard of hearing Neck: Neck is notable for no palpable adenopathy.  He has anterior lymphedema, mild Skin: Skin in treatment fields shows satisfactory healing   Heart regular rate and rhythm Chest clear to auscultation bilaterally  Lab Findings: Lab Results  Component Value Date   WBC 3.6 (L) 06/02/2020   HGB 9.6 (L) 06/02/2020   HCT 27.7 (L) 06/02/2020   MCV 95.2 06/02/2020   PLT 116 (L) 06/02/2020    Lab Results  Component Value Date   TSH 1.930 09/19/2019    Radiographic Findings: NM PET Image Restag (  PS) Skull Base To Thigh  Result Date: 07/27/2020 CLINICAL DATA:  Subsequent treatment strategy for head neck carcinoma. Tonsillar carcinoma EXAM: NUCLEAR MEDICINE PET SKULL BASE TO THIGH TECHNIQUE: 10.6 mCi F-18 FDG was injected intravenously. Full-ring PET imaging was performed from the skull base to thigh after the radiotracer. CT data was obtained and used for attenuation correction and anatomic localization. Fasting blood glucose: 100 mg/dl COMPARISON:  PET-CT scan 01/26/2020 FINDINGS: Mediastinal blood pool activity: SUV max 2.4 Liver activity: SUV max NA NECK: Mild hypermetabolic mucosal activity in the RIGHT tonsil region with SUV max equal 4.1. No residual mass lesion. This activity is decreased dramatically from large 5 cm mass with SUV max equal 18.3. Additionally there is resolution of the large hypermetabolic RIGHT level 2 lymph node. No residual measurable lymph node remains. No hypermetabolic activity at this site. No contralateral abnormal nodal activity. Incidental CT findings: none CHEST: No hypermetabolic mediastinal or hilar nodes. No suspicious pulmonary nodules on the CT scan. Incidental CT findings: Small 2 mm nodule RIGHT middle lobe on image 119 is unchanged prior PET-CT scan. No new  nodularity. ABDOMEN/PELVIS: No abnormal hypermetabolic activity within the liver, pancreas, adrenal glands, or spleen. No hypermetabolic lymph nodes in the abdomen or pelvis. Incidental CT findings: none SKELETON: No focal hypermetabolic activity to suggest skeletal metastasis. Incidental CT findings: none IMPRESSION: 1. Mild residual mucosal metabolic activity in the RIGHT tonsil region most consistent with post therapy benign inflammation 2. Resolution hypermetabolic cervical adenopathy. No hypermetabolic lymph nodes remain within the cervical chain. 3. No evidence distant metastatic disease. Electronically Signed   By: Suzy Bouchard M.D.   On: 07/27/2020 13:51    Impression/Plan:    1) Head and Neck Cancer Status: No evidence of disease; I have personally reviewed his images.  Complete response to chemoradiation.  2) Nutritional Status: Good nutritional status.  He has not been using his PEG tube for about a month.  We can get this removed but the patient has concerns about the pain of the procedure.  He will let us know when he feels ready.  3) Swallowing: Good function, continue swallowing exercises  4) Dental: Encouraged to continue regular followup with dentistry, and dental hygiene including fluoride rinses.   5) Thyroid function: Check annually in medical oncology Lab Results  Component Value Date   TSH 1.930 09/19/2019    6) Other: He has some anterior lymphedema for which she is undergoing physical therapy.  Continue.  7) ENT needs: The patient has chronic hearing loss.  He is extremely hard of hearing.  I recommended that he be seen by audiology.  He needs to be navigated back to otolaryngology for surveillance.Meda Coffee asked Anderson Malta, our navigator, to arrange an audiology appointment at the same visit.  She will try to get him appointments in the next month or so.  8) Follow-up plans in the cancer center: I have conferred with the head and neck oncology team.  Given that we have a  new subspecialist and head and neck oncology, Dr. Chryl Heck, the team agrees that the patient would benefit from being followed by her in the long-term.  She will set up an appointment in the next 3 months to meet the patient and establish care.  I also asked Dr. Lindi Adie to weigh in on when the patient's Port-A-Cath should be removed.  Anderson Malta will navigate accordingly.   The patient will also be established with head and neck survivorship in the next 6 months.  I will  see him back in a year. The patient was encouraged to call with any issues or questions before then.  On date of service, in total, I spent 30 minutes on this encounter. Patient was seen in person with his family. _____________________________________   [name], MD

## 2020-07-30 NOTE — Telephone Encounter (Signed)
Scheduled appointment per 1/26 inbasket msg from Dr. Chryl Heck. Called patient, no answer. Left message with appointment date and time.

## 2020-07-31 NOTE — Telephone Encounter (Signed)
Requested Prescriptions  Pending Prescriptions Disp Refills  . allopurinol (ZYLOPRIM) 100 MG tablet [Pharmacy Med Name: ALLOPURINOL 100 MG TABLET] 90 tablet 0    Sig: TAKE 1 TABLET BY MOUTH EVERY DAY     Endocrinology:  Gout Agents Failed - 07/30/2020  8:53 PM      Failed - Cr in normal range and within 360 days    Creatinine  Date Value Ref Range Status  06/02/2020 1.50 (H) 0.61 - 1.24 mg/dL Final         Passed - Uric Acid in normal range and within 360 days    Uric Acid  Date Value Ref Range Status  09/19/2019 6.4 3.8 - 8.4 mg/dL Final    Comment:               Therapeutic target for gout patients: <6.0         Passed - Valid encounter within last 12 months    Recent Outpatient Visits          10 months ago Subacute sinusitis, unspecified location   [place], PA-C   1 year ago Foot pain, right   [facility] Birdie Sons, MD   3 years ago Generalized abdominal pain   [place], Vickki Muff, PA-C   3 years ago Influenza [place], Vickki Muff, PA-C   4 years ago Other chest pain   [facility], Vickki Muff, [place]

## 2020-08-02 NOTE — Progress Notes (Signed)
Oncology Nurse Navigator Documentation  I called and spoke with Mr. Allan's daughter, [name]. I informed her that [facility] does not take her dad's insurance. I called Dr. Loletha Grayer. Newman's office and they are happy to take over Mr. Blyth's ENT follow up after his cancer treatment. I informed [name] that she would need to call Dr. Pollie Friar office to set up an appointment and discuss options for audiology. [name] voiced that she would call the office after returning from a trip that she is on. She has been provided with Dr. Pollie Friar office contact information and knows to call me if she has any further questions or concerns.   Harlow Asa RN, BSN, OCN Head & Neck Oncology Nurse [place] at [facility] Phone # [phone]  Fax # [phone]

## 2020-08-03 ENCOUNTER — Telehealth: Payer: Self-pay | Admitting: Hematology and Oncology

## 2020-08-03 NOTE — Telephone Encounter (Signed)
Scheduled appt per 1/28 sch msg - left message for pt with appt date and time

## 2020-08-09 ENCOUNTER — Telehealth (HOSPITAL_COMMUNITY): Payer: Self-pay

## 2020-08-09 NOTE — Telephone Encounter (Signed)
I called patient and left a message on his answering machine to return call to Dental Medicine to schedule his S/P XRT Check.

## 2020-08-10 ENCOUNTER — Ambulatory Visit: Payer: 59 | Attending: Medical | Admitting: Physical Therapy

## 2020-08-10 ENCOUNTER — Other Ambulatory Visit: Payer: Self-pay

## 2020-08-10 ENCOUNTER — Encounter: Payer: Self-pay | Admitting: Physical Therapy

## 2020-08-10 DIAGNOSIS — I890 Lymphedema, not elsewhere classified: Secondary | ICD-10-CM | POA: Insufficient documentation

## 2020-08-10 NOTE — Therapy (Signed)
[place], [place], [address] Phone: [phone]   Fax:  [phone]  Physical Therapy Treatment  Patient Details  Name: Omar Christian MRN: [id number] Date of Birth: 18-Jul-1955 Referring Provider (PT): Sandi Mealy PA-C   Encounter Date: 08/10/2020   PT End of Session - 08/10/20 1400    Visit Number 5    Number of Visits 9    Date for PT Re-Evaluation 08/24/20    Authorization Type [employer]    PT Start Time 1305    PT Stop Time 1354    PT Time Calculation (min) 49 min    Activity Tolerance Patient tolerated treatment well    Behavior During Therapy [facility] for tasks assessed/performed           Past Medical History:  Diagnosis Date  . Abnormal findings on cardiac catheterization 09/30/15   [facility]  . Abnormal myocardial perfusion study 09/24/15   [facility]  . CAD, multiple vessel 09/30/15   per CATH @ [facility]/DR.PARACHOS  . Coronary artery disease   . GERD (gastroesophageal reflux disease)   . Gout   . H/O echocardiogram 09/24/15   [place]  . Hard of hearing   . History of pneumonia   . Hyperlipidemia   . Hypertension   . Pneumonia   . Progressive angina ([place])   . 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 CARDIAC CATHETERIZATION N/A 09/30/2015   Procedure: Left Heart Cath and Coronary Angiography;  Surgeon: [name], MD;  Location: [place] [facility];  Service: Cardiovascular;  Laterality: N/A;  . CORONARY ARTERY BYPASS GRAFT N/A 10/08/2015   Procedure: CORONARY ARTERY BYPASS GRAFTING time 4 using left internal mammary and right greater saphenous vein harvested by endovein;  Surgeon: [name], MD;  Location: [place];  Service: Open Heart Surgery;  Laterality: N/A;  . CORONARY ARTERY BYPASS GRAFT    . IR GASTROSTOMY TUBE MOD SED  02/10/2020  . IR IMAGING GUIDED PORT INSERTION   02/10/2020  . MULTIPLE EXTRACTIONS WITH ALVEOLOPLASTY N/A 02/04/2020   Procedure: Extraction of tooth #'s [phone], and 32 with alveoloplasty and gross debridement of remaining teeth.;  Surgeon: [name], DDS;  Location: [place];  Service: Oral Surgery;  Laterality: N/A;  . TEE WITHOUT CARDIOVERSION N/A 10/08/2015   Procedure: TRANSESOPHAGEAL ECHOCARDIOGRAM (TEE);  Surgeon: [name], MD;  Location: [place];  Service: Open Heart Surgery;  Laterality: N/A;  . VASECTOMY    . WISDOM TOOTH EXTRACTION      There were no vitals filed for this visit.   Subjective Assessment - 08/10/20 1307    Subjective I feel like the swelling has gone down some.    Pertinent History Post chemoradiation for tonsillar cancer, HTN, history of CAD, very HOH    Patient Stated Goals what to do for the swelling    Currently in Pain? No/denies    Pain Score 0-No pain                 LYMPHEDEMA/ONCOLOGY QUESTIONNAIRE - 08/10/20 0001      Head and Neck   4 cm superior to sternal notch around neck 44.2 cm (P)     6 cm superior to sternal notch around neck 43.8 cm (P)     8 cm superior to sternal notch around neck 44 cm (P)  Other 45 (P)    10 cm                     [facility] Adult PT Treatment/Exercise - 08/10/20 0001      Manual Therapy   Edema Management remeasured circumferences and pt demonstrates great reduction of edema    Manual Lymphatic Drainage (MLD) performed in supine: short neck, bil axillary nodes, sternal nodes, clavicular nodes, posterior, lateral and anterior neck then retracing all steps                       PT Long Term Goals - 08/10/20 1401      PT LONG TERM GOAL #1   Title Pt will be ind with self stretches for the head and neck    Time 4    Period Weeks    Status Achieved      PT LONG TERM GOAL #2   Title Pt will obtain appropriate compression garment for the head and neck lymphedema    Baseline 07/27/20- pt has obtained [place] head and  neck compression garment and has been compliant with wearing it    Time 4    Period Weeks    Status Achieved      PT LONG TERM GOAL #3   Title Pt will be ind with self MLD for the anterior neck edema and educated on possibility of flexitouch if needed    Baseline 07/27/20- pt is still requring max cueing for this; 08/10/20- pt is independent in simplified version of self MLD    Time 4    Period Weeks    Status Achieved      PT LONG TERM GOAL #4   Title Pt will receive trial of FlexiTouch compression pump for long term management of neck lymphedema.    Baseline 08/10/20- pt is in the process of getting the compression pump but needs to apply for financial assistance    Time 4    Period Weeks    Status Achieved                 Plan - 08/10/20 1358    Clinical Impression Statement Assessed pt's progress towards goals in therapy. Pt has now met all goals for therapy. CIrcumference measurements taken today demosntrate a decrease in his neck swelling. Pt can also tell he is having less swelling. Pt has been attempting self MLD at home and has been wearing the chip pack. His is working with [place] to get a compression pump and needs to fill out paperwork and send back to them. Pt is now ready for discharge from skilled PT services as he is now able to self manage his lymphedema.           Patient will benefit from skilled therapeutic intervention in order to improve the following deficits and impairments:     Visit Diagnosis: Lymphedema     Problem List Patient Active Problem List   Diagnosis Date Noted  . Port-A-Cath in place 04/01/2020  . Fever   . Tachycardia   . Severe sepsis ([place]) 03/04/2020  . Patient on antineoplastic chemotherapy regimen   . Goals of care, counseling/discussion   . Palliative care by specialist   . DNR (do not resuscitate) discussion   . Sepsis ([place]) 03/02/2020  . Cancer of tonsil ([place]) 02/04/2020  . Poor dentition 02/04/2020  . Cancer of  overlapping sites of tonsil ([place]) 01/31/2020  . 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 Unstable angina pectoris ([place]) 10/04/2015  . Progressive angina ([place])   . Abnormal findings on cardiac catheterization 09/30/2015  . CAD, multiple vessel 09/30/2015  . Abnormal findings diagnostic imaging of heart and coronary circulation 09/30/2015  . CAD in native artery 09/30/2015  . H/O echocardiogram 09/24/2015  . Abnormal myocardial perfusion study 09/24/2015  . Chest pain 09/03/2015  . Allergic rhinitis 08/27/2015  . H/O: gout 08/27/2015  . HLD (hyperlipidemia) 08/27/2015  . Benign hypertension 08/27/2015  . Acid reflux 08/27/2015  . Essential (primary) hypertension 08/27/2015  . H/O disease 08/27/2015    Allyson Sabal [facility] 08/10/2020, 2:03 PM  [place], [place], [address] Phone: [phone]   Fax:  [phone]  Name: Omar Christian MRN: [id number] Date of Birth: [birth year]  PHYSICAL THERAPY DISCHARGE SUMMARY  Visits from Start of Care: 5  Current functional level related to goals / functional outcomes: All goals met   Remaining deficits: None   Education / Equipment: Self MLD, compression pump   Plan: Patient agrees to discharge.  Patient goals were met. Patient is being discharged due to meeting the stated rehab goals.  ?????     Allyson Sabal [place], [place] 08/10/20 2:04 PM

## 2020-08-13 ENCOUNTER — Ambulatory Visit: Payer: 59

## 2020-09-17 ENCOUNTER — Encounter: Payer: Self-pay | Admitting: Hematology and Oncology

## 2020-09-17 ENCOUNTER — Inpatient Hospital Stay: Payer: 59 | Attending: Hematology and Oncology

## 2020-09-17 ENCOUNTER — Other Ambulatory Visit: Payer: Self-pay

## 2020-09-17 DIAGNOSIS — C098 Malignant neoplasm of overlapping sites of tonsil: Secondary | ICD-10-CM | POA: Insufficient documentation

## 2020-09-17 DIAGNOSIS — C099 Malignant neoplasm of tonsil, unspecified: Secondary | ICD-10-CM

## 2020-09-17 DIAGNOSIS — Z95828 Presence of other vascular implants and grafts: Secondary | ICD-10-CM

## 2020-09-17 DIAGNOSIS — Z452 Encounter for adjustment and management of vascular access device: Secondary | ICD-10-CM | POA: Insufficient documentation

## 2020-09-17 MED ORDER — SODIUM CHLORIDE 0.9% FLUSH
10.0000 mL | Freq: Once | INTRAVENOUS | Status: AC
  Administered 2020-09-17: 10 mL
  Filled 2020-09-17: qty 10

## 2020-09-17 MED ORDER — HEPARIN SOD (PORK) LOCK FLUSH 100 UNIT/ML IV SOLN
500.0000 [IU] | Freq: Once | INTRAVENOUS | Status: AC
  Administered 2020-09-17: 500 [IU]
  Filled 2020-09-17: qty 5

## 2020-09-17 NOTE — Patient Instructions (Signed)

## 2020-09-20 ENCOUNTER — Other Ambulatory Visit: Payer: Self-pay

## 2020-09-20 DIAGNOSIS — C099 Malignant neoplasm of tonsil, unspecified: Secondary | ICD-10-CM

## 2020-09-20 NOTE — Progress Notes (Signed)
IR

## 2020-10-11 ENCOUNTER — Other Ambulatory Visit: Payer: Self-pay | Admitting: Radiology

## 2020-10-11 NOTE — Patient Instructions (Signed)
PEG Tube Removal, Care After This sheet gives you information about how to care for yourself after your procedure. Your health care provider may also give you more specific instructions. If you have problems or questions, contact your health care provider. What can I expect after the procedure? After the procedure, it is common to have:  Mild discomfort at the opening in your skin where the tube was removed (tube removal site).  A small amount of drainage from the opening. Follow these instructions at home: Care of the tube removal site  Follow instructions from your health care provider about how to take care of your tube removal site. Make sure you: ? Wash your hands with soap and water for at least 20 seconds before and after you change your bandage (dressing). If soap and water are not available, use hand sanitizer. ? Change your dressing as told by your health care provider.  Check your tube removal site every day for signs of infection. Check for: ? Redness, swelling, or pain. ? Fluid or blood. ? Warmth. ? Pus or a bad smell.  Do not take baths, swim, or use a hot tub until your health care provider approves. You may remove your dressing 24 hours after the procedure and shower. Wear a dressing over the site until it has healed. Activity  Return to your normal activities as told by your health care provider. Ask your health care provider what activities are safe for you.  Do not lift anything that is heavier than 10 lb (4.5 kg), or the limit that you are told, until your health care provider says that it is safe.   General instructions  Take over-the-counter and prescription medicines only as told by your health care provider.  Follow instructions from your health care provider about eating and drinking.  Keep all follow-up visits. This is important. Contact a health care provider if:  You have a fever.  You have pain in your abdomen.  You have redness, swelling, or pain  around your tube removal site.  You have fluid or blood coming from your tube removal site.  Your tube removal site feels warm to the touch.  You have pus or a bad smell coming from your tube removal site.  You have nausea or vomiting. Get help right away if:  You have persistent bleeding from your tube removal site.  You have severe pain in your abdomen.  You are not able to eat or drink anything by mouth. Summary  Follow instructions from your health care provider about how to take care of your tube removal site.  Check your tube removal site every day for signs of infection.  Return to your normal activities as told by your health care provider. This information is not intended to replace advice given to you by your health care provider. Make sure you discuss any questions you have with your health care provider. Document Revised: 10/31/2019 Document Reviewed: 10/31/2019 Elsevier Patient Education  [place].

## 2020-10-12 ENCOUNTER — Other Ambulatory Visit: Payer: Self-pay

## 2020-10-12 ENCOUNTER — Ambulatory Visit (HOSPITAL_COMMUNITY)
Admission: RE | Admit: 2020-10-12 | Discharge: 2020-10-12 | Disposition: A | Discharge status: Home / Self Care | Payer: 59 | Source: Ambulatory Visit | Attending: Hematology and Oncology | Admitting: Hematology and Oncology

## 2020-10-12 ENCOUNTER — Encounter (HOSPITAL_COMMUNITY): Payer: Self-pay

## 2020-10-12 DIAGNOSIS — Z801 Family history of malignant neoplasm of trachea, bronchus and lung: Secondary | ICD-10-CM | POA: Insufficient documentation

## 2020-10-12 DIAGNOSIS — Z79899 Other long term (current) drug therapy: Secondary | ICD-10-CM | POA: Diagnosis not present

## 2020-10-12 DIAGNOSIS — Z431 Encounter for attention to gastrostomy: Secondary | ICD-10-CM | POA: Insufficient documentation

## 2020-10-12 DIAGNOSIS — Z951 Presence of aortocoronary bypass graft: Secondary | ICD-10-CM | POA: Insufficient documentation

## 2020-10-12 DIAGNOSIS — I10 Essential (primary) hypertension: Secondary | ICD-10-CM | POA: Diagnosis not present

## 2020-10-12 DIAGNOSIS — I2510 Atherosclerotic heart disease of native coronary artery without angina pectoris: Secondary | ICD-10-CM | POA: Diagnosis not present

## 2020-10-12 DIAGNOSIS — Z85818 Personal history of malignant neoplasm of other sites of lip, oral cavity, and pharynx: Secondary | ICD-10-CM | POA: Diagnosis not present

## 2020-10-12 DIAGNOSIS — E785 Hyperlipidemia, unspecified: Secondary | ICD-10-CM | POA: Insufficient documentation

## 2020-10-12 DIAGNOSIS — Z8249 Family history of ischemic heart disease and other diseases of the circulatory system: Secondary | ICD-10-CM | POA: Diagnosis not present

## 2020-10-12 DIAGNOSIS — H9190 Unspecified hearing loss, unspecified ear: Secondary | ICD-10-CM | POA: Diagnosis not present

## 2020-10-12 DIAGNOSIS — C099 Malignant neoplasm of tonsil, unspecified: Secondary | ICD-10-CM

## 2020-10-12 DIAGNOSIS — Z9852 Vasectomy status: Secondary | ICD-10-CM | POA: Insufficient documentation

## 2020-10-12 DIAGNOSIS — Z452 Encounter for adjustment and management of vascular access device: Secondary | ICD-10-CM | POA: Insufficient documentation

## 2020-10-12 HISTORY — PX: IR GASTROSTOMY TUBE REMOVAL: IMG5492

## 2020-10-12 HISTORY — PX: IR REMOVAL TUN ACCESS W/ PORT W/O FL MOD SED: IMG2290

## 2020-10-12 LAB — CBC WITH DIFFERENTIAL/PLATELET
Abs Immature Granulocytes: 0.01 10*3/uL (ref 0.00–0.07)
Basophils Absolute: 0 10*3/uL (ref 0.0–0.1)
Basophils Relative: 1 %
Eosinophils Absolute: 0 10*3/uL (ref 0.0–0.5)
Eosinophils Relative: 1 %
HCT: 37.8 % — ABNORMAL LOW (ref 39.0–52.0)
Hemoglobin: 12.7 g/dL — ABNORMAL LOW (ref 13.0–17.0)
Immature Granulocytes: 0 %
Lymphocytes Relative: 15 %
Lymphs Abs: 0.6 10*3/uL — ABNORMAL LOW (ref 0.7–4.0)
MCH: 30 pg (ref 26.0–34.0)
MCHC: 33.6 g/dL (ref 30.0–36.0)
MCV: 89.4 fL (ref 80.0–100.0)
Monocytes Absolute: 0.2 10*3/uL (ref 0.1–1.0)
Monocytes Relative: 6 %
Neutro Abs: 2.9 10*3/uL (ref 1.7–7.7)
Neutrophils Relative %: 77 %
Platelets: 138 10*3/uL — ABNORMAL LOW (ref 150–400)
RBC: 4.23 MIL/uL (ref 4.22–5.81)
RDW: 13.3 % (ref 11.5–15.5)
WBC: 3.7 10*3/uL — ABNORMAL LOW (ref 4.0–10.5)
nRBC: 0 % (ref 0.0–0.2)

## 2020-10-12 LAB — BASIC METABOLIC PANEL
Anion gap: 10 (ref 5–15)
BUN: 18 mg/dL (ref 8–23)
CO2: 23 mmol/L (ref 22–32)
Calcium: 9.7 mg/dL (ref 8.9–10.3)
Chloride: 105 mmol/L (ref 98–111)
Creatinine, Ser: 1.41 mg/dL — ABNORMAL HIGH (ref 0.61–1.24)
GFR, Estimated: 56 mL/min — ABNORMAL LOW (ref >60)
Glucose, Bld: 114 mg/dL — ABNORMAL HIGH (ref 70–99)
Potassium: 4.2 mmol/L (ref 3.5–5.1)
Sodium: 138 mmol/L (ref 135–145)

## 2020-10-12 LAB — PROTIME-INR
INR: 0.9 (ref 0.8–1.2)
Prothrombin Time: 12.5 seconds (ref 11.4–15.2)

## 2020-10-12 MED ORDER — MIDAZOLAM HCL 2 MG/2ML IJ SOLN
INTRAMUSCULAR | Status: AC | PRN
  Administered 2020-10-12: 1 mg via INTRAVENOUS
  Administered 2020-10-12: 2 mg via INTRAVENOUS
  Administered 2020-10-12: 1 mg via INTRAVENOUS

## 2020-10-12 MED ORDER — FENTANYL CITRATE (PF) 100 MCG/2ML IJ SOLN
INTRAMUSCULAR | Status: AC
  Filled 2020-10-12: qty 2

## 2020-10-12 MED ORDER — LIDOCAINE-EPINEPHRINE 1 %-1:100000 IJ SOLN
INTRAMUSCULAR | Status: AC | PRN
  Administered 2020-10-12: 10 mL

## 2020-10-12 MED ORDER — SODIUM CHLORIDE 0.9 % IV SOLN: INTRAVENOUS | Status: DC

## 2020-10-12 MED ORDER — LIDOCAINE-EPINEPHRINE 1 %-1:100000 IJ SOLN
INTRAMUSCULAR | Status: AC
  Filled 2020-10-12: qty 1

## 2020-10-12 MED ORDER — FENTANYL CITRATE (PF) 100 MCG/2ML IJ SOLN
INTRAMUSCULAR | Status: AC | PRN
  Administered 2020-10-12 (×2): 50 ug via INTRAVENOUS

## 2020-10-12 MED ORDER — LIDOCAINE VISCOUS HCL 2 % MT SOLN
OROMUCOSAL | Status: AC
  Filled 2020-10-12: qty 15

## 2020-10-12 MED ORDER — MIDAZOLAM HCL 2 MG/2ML IJ SOLN
INTRAMUSCULAR | Status: AC
  Filled 2020-10-12: qty 4

## 2020-10-12 NOTE — H&P (Signed)
Chief Complaint: Patient was seen in consultation today for port-a-catheter and gastrostomy tube removal at the request of [place]  Referring Physician(s): Gudena,Vinay  Supervising Physician: Jacqulynn Cadet  Patient Status: Omar Christian - Out-pt  History of Present Illness: Omar Christian is a 65 y.o. male with history of tonsillar cancer s/p chemoradiation therapy.  Patient is known to our service for image guided Port-A-Cath and gastrostomy tube placement on 02/10/2020 with Dr. Kathlene Cote. Patient underwent PET scan on 07/27/2020 which showed resolution of hypermetabolic cervical adenopathy, no hypermetabolic lymph nodes remain within the cervical chain, and no evidence of distant metastatic disease. Patient was seen by Dr. Isidore Moos on 07/28/2020, and after thorough discussion and shared decision-making, it was determined that the portacatheter and gastrostomy tube can be removed safely at this time.  IR was requested for port-a-catheter and gastrostomy tube removal.  Patient laying in bed, not in acute distress.  Denise headache, fever, chills, shortness of breath, cough, chest pain, abdominal pain, nausea ,vomiting, and bleeding.   Past Medical History:  Diagnosis Date  . Abnormal findings on cardiac catheterization 09/30/15   [facility]  . Abnormal myocardial perfusion study 09/24/15   [facility]  . CAD, multiple vessel 09/30/15   per CATH @ [facility]/DR.PARACHOS  . Coronary artery disease   . GERD (gastroesophageal reflux disease)   . Gout   . H/O echocardiogram 09/24/15   [facility]  . Hard of hearing   . History of pneumonia   . Hyperlipidemia   . Hypertension   . Pneumonia   . Progressive angina ([place])   . 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 CARDIAC CATHETERIZATION N/A 09/30/2015   Procedure: Left Heart Cath and Coronary Angiography;  Surgeon: [name], MD;  Location: [place] [facility];  Service: Cardiovascular;  Laterality: N/A;  . CORONARY ARTERY BYPASS GRAFT N/A 10/08/2015   Procedure: CORONARY ARTERY BYPASS GRAFTING time 4 using left internal mammary and right greater saphenous vein harvested by endovein;  Surgeon: [name], MD;  Location: [place];  Service: Open Heart Surgery;  Laterality: N/A;  . CORONARY ARTERY BYPASS GRAFT    . IR GASTROSTOMY TUBE MOD SED  02/10/2020  . IR IMAGING GUIDED PORT INSERTION  02/10/2020  . MULTIPLE EXTRACTIONS WITH ALVEOLOPLASTY N/A 02/04/2020   Procedure: Extraction of tooth #'s [phone], and 32 with alveoloplasty and gross debridement of remaining teeth.;  Surgeon: [name], DDS;  Location: [place];  Service: Oral Surgery;  Laterality: N/A;  . TEE WITHOUT CARDIOVERSION N/A 10/08/2015   Procedure: TRANSESOPHAGEAL ECHOCARDIOGRAM (TEE);  Surgeon: [name], MD;  Location: [place];  Service: Open Heart Surgery;  Laterality: N/A;  . VASECTOMY    . WISDOM TOOTH EXTRACTION      Allergies: Patient has no known allergies.  Medications: Prior to Admission medications   Medication Sig Start Date End Date Taking? Authorizing Provider  allopurinol (ZYLOPRIM) 100 MG tablet TAKE 1 TABLET BY MOUTH EVERY DAY 07/31/20   Chrismon, Vickki Muff, PA-C  atorvastatin (LIPITOR) 20 MG tablet Take 1 tablet (20 mg total) by mouth daily. Patient not taking: Reported on 07/28/2020 09/25/19   Chrismon, Vickki Muff, PA-C  dexamethasone (DECADRON) 4 MG tablet Take 2 tablets (8 mg total) by mouth daily. Take daily for 3 days after chemo. Take with food. 02/02/20   [name], MD  guaiFENesin (ROBITUSSIN) 100 MG/5ML liquid Take 20  mLs (400 mg total) by mouth 3 (three) times daily as needed for cough. May loosen secretions. Patient not taking: Reported on 07/28/2020 05/05/20   [name], MD  lidocaine (XYLOCAINE) 2 % solution Patient: Mix 1part 2% viscous lidocaine, 1part H20. Swish & swallow 69mL of diluted mixture, 32min before eating and at  bedtime, up to 5xs daily. 03/01/20   [name], MD  lidocaine-prilocaine (EMLA) cream Apply to affected area once 02/02/20   [name], MD  LORazepam (ATIVAN) 0.5 MG tablet Take 1 tablet (0.5 mg total) by mouth at bedtime as needed (Nausea or vomiting). 02/02/20   [name], MD  magic mouthwash w/lidocaine SOLN Take 5 mLs by mouth 5 (five) times daily as needed for mouth pain. 03/05/20   Max Sane, MD  metoCLOPramide (REGLAN) 10 MG/10ML SOLN Take 5 mLs (5 mg total) by mouth 3 (three) times daily before meals. 03/22/20   Tanner, Lyndon Code., PA-C  metoprolol succinate (TOPROL XL) 25 MG 24 hr tablet Take 1 tablet (25 mg total) by mouth daily. Patient not taking: Reported on 07/28/2020 02/17/20   [name], MD  MITIGARE 0.6 MG CAPS Take 1 capsule by mouth daily. Patient not taking: Reported on 07/28/2020 02/17/20   [provider]  Nutritional Supplements (KATE FARMS STANDARD 1.4) LIQD 488 mLs by Gastrostomy Tube route 4 (four) times daily. Patient not taking: Reported on 07/28/2020 02/25/20   [name], MD  nystatin (MYCOSTATIN) 100000 UNIT/ML suspension TAKE 5 MLS (500,000 UNITS TOTAL) BY MOUTH IN THE MORNING, AT NOON, AND AT BEDTIME. 03/09/20   [name], MD  ondansetron (ZOFRAN) 8 MG tablet Take 1 tablet (8 mg total) by mouth 2 (two) times daily as needed. Start on the third day after chemotherapy. 02/02/20   [name], MD  oxyCODONE-acetaminophen (PERCOCET/ROXICET) 5-325 MG tablet Take 1 tablet by mouth every 4 (four) hours as needed.  01/14/20   [provider]  prochlorperazine (COMPAZINE) 10 MG tablet Take 1 tablet (10 mg total) by mouth every 6 (six) hours as needed (Nausea or vomiting). 02/02/20   [name], MD  scopolamine (TRANSDERM-SCOP) 1 MG/3DAYS Place 1 patch (1.5 mg total) onto the skin every 3 (three) days. 05/05/20   [name], MD  sodium fluoride (PREVIDENT 5000 PLUS) 1.1 % CREA dental cream Apply to tooth brush. Brush teeth for 2 minutes. Spit out  excess-DO NOT swallow. DO NOT rinse afterwards. Repeat nightly. Patient not taking: Reported on 07/28/2020 01/28/20   [name], DDS     Family History  Problem Relation Age of Onset  . Heart disease Mother   . Hypertension Mother   . Alcohol abuse Father   . Throat cancer Father   . Muscular dystrophy Brother   . Muscular dystrophy Brother     Social History   Socioeconomic History  . Marital status: Married    Spouse name: Not on file  . Number of children: 2  . Years of education: Not on file  . Highest education level: Not on file  Occupational History  . Not on file  Tobacco Use  . Smoking status: Never Smoker  . Smokeless tobacco: Never Used  Vaping Use  . Vaping Use: Never used  Substance and Sexual Activity  . Alcohol use: No  . Drug use: No  . Sexual activity: Not Currently    Birth control/protection: Surgical    Comment: Vasectomy  Other Topics Concern  . Not on file  Social History Narrative  . Not on  file   Social Determinants of Health   Financial Resource Strain: Low Risk   . Difficulty of Paying Living Expenses: Not very hard  Food Insecurity: Not on file  Transportation Needs: No Transportation Needs  . Lack of Transportation (Medical): No  . Lack of Transportation (Non-Medical): No  Physical Activity: Not on file  Stress: Not on file  Social Connections: Not on file     Review of Systems: A 12 point ROS discussed and pertinent positives are indicated in the HPI above.  All other systems are negative.   Vital Signs: BP (!) 158/82   Pulse 78   Temp 98.4 F (36.9 C) (Oral)   Resp 18   SpO2 100%   Physical Exam Vitals and nursing note reviewed.  Constitutional:      General: He is not in acute distress.    Appearance: Normal appearance.  HENT:     Head: Normocephalic and atraumatic.     Mouth/Throat:     Mouth: Mucous membranes are moist.     Pharynx: Oropharynx is clear.  Cardiovascular:     Rate and Rhythm: Normal rate  and regular rhythm.     Pulses: Normal pulses.     Heart sounds: Normal heart sounds.  Pulmonary:     Effort: Pulmonary effort is normal.     Breath sounds: Normal breath sounds. No wheezing, rhonchi or rales.  Abdominal:     General: Bowel sounds are normal. There is no distension.     Palpations: Abdomen is soft.  Skin:    General: Skin is warm and dry.  Neurological:     Mental Status: He is alert and oriented to person, place, and time.  Psychiatric:        Mood and Affect: Mood normal.        Behavior: Behavior normal.    MD Evaluation Airway: WNL Heart: WNL Abdomen: WNL Chest/ Lungs: WNL ASA  Classification: 3 Mallampati/Airway Score: Two  Imaging: No results found.  Labs:  CBC: Recent Labs    05/05/20 1253 05/19/20 0815 05/26/20 0830 06/02/20 1358  WBC 3.9* 4.0 3.4* 3.6*  HGB 7.4* 9.1* 9.5* 9.6*  HCT 21.5* 26.4* 27.8* 27.7*  PLT 134* 132* 132* 116*    COAGS: Recent Labs    02/10/20 1015 03/02/20 2220 03/02/20 2340 03/03/20 1146  INR 1.1 1.0  --  1.2  APTT  --   --  35  --     BMP: Recent Labs    03/05/20 0529 03/09/20 0910 03/22/20 1050 04/01/20 0856 04/14/20 0940 05/05/20 1305 05/19/20 0815 05/26/20 0830 06/02/20 1358  NA 131* 135 136 132*   < > 134* 134* 135 133*  K 3.5 3.5 3.6 4.2   < > 4.2 3.7 4.3 4.2  CL 94* 97* 98 95*   < > 99 97* 101 100  CO2 27 28 27 28    < > 28 24 24 24   GLUCOSE 130* 154* 129* 169*   < > 100* 117* 119* 105*  BUN 38* 29* 20 31*   < > 22 25* 15 17  CALCIUM 8.3* 8.9 9.5 8.7*   < > 8.7* 9.8 9.2 9.3  CREATININE 1.81* 1.38* 1.62* 1.45*   < > 1.23 1.51* 1.38* 1.50*  GFRNONAA 39* 54* 45* 51*   < > >60 52* 57* 52*  GFRAA 45* >60 52* 59*  --   --   --   --   --    < > = values in  this interval not displayed.    LIVER FUNCTION TESTS: Recent Labs    05/05/20 1305 05/19/20 0815 05/26/20 0830 06/02/20 1358  BILITOT 0.4 0.8 0.7 0.6  AST 14* 14* 18 15  ALT 11 7 11 8   ALKPHOS 64 62 63 69  PROT 6.0* 6.7 6.5 6.4*   ALBUMIN 3.3* 3.7 3.6 3.8    TUMOR MARKERS: No results for input(s): AFPTM, CEA, CA199, CHROMGRNA in the last 8760 hours.  Assessment and Plan: 65 y.o. male with history of tonsillar cancer, no evidence of malignancy seen on recent PET scan.  After thorough discussion and shared decision making with his oncology team, it was determined that the [place] and gastrostomy tube can be removed safely at this time. IR was requested for Port-A-Catheter and gastrostomy removal. Patient presents to IR today for the procedure. N.p.o. since midnight. Labs pending Afebrile  Risks and benefits of image guided port-a-catheter removal was discussed with the patient including, but not limited to bleeding, infection, and damage to vessel.  Risks and benefits image guided gastrostomy tube removal was discussed with the patient including, but not limited to bleeding and infection.   All of the patient's questions were answered, patient is agreeable to proceed. Consent signed and in chart.    Thank you for this interesting consult.  I greatly enjoyed meeting KENDAN CORNFORTH and look forward to participating in their care.  A copy of this report was sent to the requesting provider on this date.  Electronically Signed: Tera Mater, PA-C 10/12/2020, 10:22 AM   I spent a total of   15 Minutes in face to face in clinical consultation, greater than 50% of which was counseling/coordinating care for port-a-cathter and gastrostomy tube removal

## 2020-10-12 NOTE — Discharge Instructions (Signed)
Please call Interventional Radiology clinic [phone] with any questions or concerns.  You may remove your dressing and shower tomorrow.   Implanted Port Removal, Care After This sheet gives you information about how to care for yourself after your procedure. Your health care provider may also give you more specific instructions. If you have problems or questions, contact your health care provider. What can I expect after the procedure? After the procedure, it is common to have:  Soreness or pain near your incision.  Some swelling or bruising near your incision. Follow these instructions at home: Medicines  Take over-the-counter and prescription medicines only as told by your health care provider.  If you were prescribed an antibiotic medicine, take it as told by your health care provider. Do not stop taking the antibiotic even if you start to feel better. Bathing  Do not take baths, swim, or use a hot tub until your health care provider approves. Ask your health care provider if you can take showers. You may only be allowed to take sponge baths. Incision care  Follow instructions from your health care provider about how to take care of your incision. Make sure you: ? Wash your hands with soap and water before you change your bandage (dressing). If soap and water are not available, use hand sanitizer. ? Change your dressing as told by your health care provider. ? Keep your dressing dry. ? Leave stitches (sutures), skin glue, or adhesive strips in place. These skin closures may need to stay in place for 2 weeks or longer. If adhesive strip edges start to loosen and curl up, you may trim the loose edges. Do not remove adhesive strips completely unless your health care provider tells you to do that.  Check your incision area every day for signs of infection. Check for: ? More redness, swelling, or pain. ? More fluid or blood. ? Warmth. ? Pus or a bad smell.   Driving  Do not  drive for 24 hours if you were given a medicine to help you relax (sedative) during your procedure.  If you did not receive a sedative, ask your health care provider when it is safe to drive.   Activity  Return to your normal activities as told by your health care provider. Ask your health care provider what activities are safe for you.  Do not lift anything that is heavier than 10 lb (4.5 kg), or the limit that you are told, until your health care provider says that it is safe.  Do not do activities that involve lifting your arms over your head. General instructions  Do not use any products that contain nicotine or tobacco, such as cigarettes and e-cigarettes. These can delay healing. If you need help quitting, ask your health care provider.  Keep all follow-up visits as told by your health care provider. This is important. Contact a health care provider if:  You have more redness, swelling, or pain around your incision.  You have more fluid or blood coming from your incision.  Your incision feels warm to the touch.  You have pus or a bad smell coming from your incision.  You have pain that is not relieved by your pain medicine. Get help right away if you have:  A fever or chills.  Chest pain.  Difficulty breathing. Summary  After the procedure, it is common to have pain, soreness, swelling, or bruising near your incision.  If you were prescribed an antibiotic medicine, take it as told by   your health care provider. Do not stop taking the antibiotic even if you start to feel better.  Do not drive for 24 hours if you were given a sedative during your procedure.  Return to your normal activities as told by your health care provider. Ask your health care provider what activities are safe for you. This information is not intended to replace advice given to you by your health care provider. Make sure you discuss any questions you have with your health care provider. Document  Revised: 08/02/2017 Document Reviewed: 08/02/2017 Elsevier Patient Education  [employer].   PEG Tube Removal, Care After This sheet gives you information about how to care for yourself after your procedure. Your health care provider may also give you more specific instructions. If you have problems or questions, contact your health care provider. What can I expect after the procedure? After the procedure, it is common to have:  Mild discomfort at the opening in your skin where the tube was removed (tube removal site).  A small amount of drainage from the opening. Follow these instructions at home: Care of the tube removal site  Follow instructions from your health care provider about how to take care of your tube removal site. Make sure you: ? Wash your hands with soap and water for at least 20 seconds before and after you change your bandage (dressing). If soap and water are not available, use hand sanitizer. ? Change your dressing as told by your health care provider.  Check your tube removal site every day for signs of infection. Check for: ? Redness, swelling, or pain. ? Fluid or blood. ? Warmth. ? Pus or a bad smell.  Do not take baths, swim, or use a hot tub until your health care provider approves. Ask your health care provider if you may take showers. You may only be allowed to take sponge baths. Activity  Return to your normal activities as told by your health care provider. Ask your health care provider what activities are safe for you.  Do not lift anything that is heavier than 10 lb (4.5 kg), or the limit that you are told, until your health care provider says that it is safe.   General instructions  Take over-the-counter and prescription medicines only as told by your health care provider.  Follow instructions from your health care provider about eating and drinking.  Keep all follow-up visits. This is important. Contact a health care provider if:  You  have a fever.  You have pain in your abdomen.  You have redness, swelling, or pain around your tube removal site.  You have fluid or blood coming from your tube removal site.  Your tube removal site feels warm to the touch.  You have pus or a bad smell coming from your tube removal site.  You have nausea or vomiting. Get help right away if:  You have persistent bleeding from your tube removal site.  You have severe pain in your abdomen.  You are not able to eat or drink anything by mouth. Summary  Follow instructions from your health care provider about how to take care of your tube removal site.  Do not take baths, swim, or use a hot tub until your health care provider approves. Ask your health care provider if you may take showers. You may only be allowed to take sponge baths.  Check your tube removal site every day for signs of infection.  Return to your normal activities as  told by your health care provider. This information is not intended to replace advice given to you by your health care provider. Make sure you discuss any questions you have with your health care provider. Document Revised: 10/31/2019 Document Reviewed: 10/31/2019 Elsevier Patient Education  2021 [employer].   Moderate Conscious Sedation, Adult, Care After This sheet gives you information about how to care for yourself after your procedure. Your health care provider may also give you more specific instructions. If you have problems or questions, contact your health care provider. What can I expect after the procedure? After the procedure, it is common to have:  Sleepiness for several hours.  Impaired judgment for several hours.  Difficulty with balance.  Vomiting if you eat too soon. Follow these instructions at home: For the time period you were told by your health care provider:  Rest.  Do not participate in activities where you could fall or become injured.  Do not drive or use  machinery.  Do not drink alcohol.  Do not take sleeping pills or medicines that cause drowsiness.  Do not make important decisions or sign legal documents.  Do not take care of children on your own.      Eating and drinking  Follow the diet recommended by your health care provider.  Drink enough fluid to keep your urine pale yellow.  If you vomit: ? Drink water, juice, or soup when you can drink without vomiting. ? Make sure you have little or no nausea before eating solid foods.   General instructions  Take over-the-counter and prescription medicines only as told by your health care provider.  Have a responsible adult stay with you for the time you are told. It is important to have someone help care for you until you are awake and alert.  Do not smoke.  Keep all follow-up visits as told by your health care provider. This is important. Contact a health care provider if:  You are still sleepy or having trouble with balance after 24 hours.  You feel light-headed.  You keep feeling nauseous or you keep vomiting.  You develop a rash.  You have a fever.  You have redness or swelling around the IV site. Get help right away if:  You have trouble breathing.  You have new-onset confusion at home. Summary  After the procedure, it is common to feel sleepy, have impaired judgment, or feel nauseous if you eat too soon.  Rest after you get home. Know the things you should not do after the procedure.  Follow the diet recommended by your health care provider and drink enough fluid to keep your urine pale yellow.  Get help right away if you have trouble breathing or new-onset confusion at home. This information is not intended to replace advice given to you by your health care provider. Make sure you discuss any questions you have with your health care provider. Document Revised: 10/17/2019 Document Reviewed: 05/15/2019 Elsevier Patient Education  2021 [employer].

## 2020-10-12 NOTE — Procedures (Signed)
Interventional Radiology Procedure Note  Procedure: Removal of portacatheter and g-tube.  Complications: None  Estimated Blood Loss: None  Recommendations: - DC home in 1 hr - Routine wound care \  Signed,  Criselda Peaches, MD

## 2020-10-27 ENCOUNTER — Telehealth: Payer: Self-pay | Admitting: Hematology and Oncology

## 2020-10-27 ENCOUNTER — Ambulatory Visit: Payer: 59 | Admitting: Hematology and Oncology

## 2020-10-27 ENCOUNTER — Encounter: Payer: Self-pay | Admitting: Hematology and Oncology

## 2020-10-27 ENCOUNTER — Other Ambulatory Visit: Payer: Self-pay

## 2020-10-27 ENCOUNTER — Inpatient Hospital Stay: Payer: 59 | Attending: Hematology and Oncology | Admitting: Hematology and Oncology

## 2020-10-27 VITALS — BP 149/87 | HR 75 | Temp 97.0°F | Resp 18 | Wt 200.9 lb

## 2020-10-27 DIAGNOSIS — C099 Malignant neoplasm of tonsil, unspecified: Secondary | ICD-10-CM

## 2020-10-27 DIAGNOSIS — I2510 Atherosclerotic heart disease of native coronary artery without angina pectoris: Secondary | ICD-10-CM | POA: Diagnosis not present

## 2020-10-27 DIAGNOSIS — Z951 Presence of aortocoronary bypass graft: Secondary | ICD-10-CM | POA: Insufficient documentation

## 2020-10-27 DIAGNOSIS — Z923 Personal history of irradiation: Secondary | ICD-10-CM | POA: Insufficient documentation

## 2020-10-27 DIAGNOSIS — C098 Malignant neoplasm of overlapping sites of tonsil: Secondary | ICD-10-CM | POA: Diagnosis present

## 2020-10-27 DIAGNOSIS — Z7952 Long term (current) use of systemic steroids: Secondary | ICD-10-CM | POA: Diagnosis not present

## 2020-10-27 DIAGNOSIS — I10 Essential (primary) hypertension: Secondary | ICD-10-CM | POA: Insufficient documentation

## 2020-10-27 DIAGNOSIS — Z79899 Other long term (current) drug therapy: Secondary | ICD-10-CM | POA: Insufficient documentation

## 2020-10-27 DIAGNOSIS — R682 Dry mouth, unspecified: Secondary | ICD-10-CM | POA: Insufficient documentation

## 2020-10-27 DIAGNOSIS — Z9221 Personal history of antineoplastic chemotherapy: Secondary | ICD-10-CM | POA: Diagnosis not present

## 2020-10-27 DIAGNOSIS — I890 Lymphedema, not elsewhere classified: Secondary | ICD-10-CM | POA: Diagnosis not present

## 2020-10-27 NOTE — Progress Notes (Signed)
[name]: Chrismon, Vickki Muff, PA-C as PCP - General (Family Medicine) [name], MD (Inactive) as Consulting Physician (Cardiothoracic Surgery) [name], MD as Consulting Physician (Cardiology) [name], MD as Consulting Physician (Hematology and Oncology) [name], MD as Attending Physician (Radiation Oncology) [name], [name], RN as Oncology Nurse Navigator [name], [name], [place] as Speech Language Pathologist (Speech Pathology) [name], DDS as Consulting Physician (Dentistry) [name], [name], PT as Physical Therapist (Physical Therapy) [name], LCSW as Social Worker (General Practice) [name], LCSW as Social Worker [name], RD as Dietitian (Nutrition)  CHIEF COMPLAINTS/PURPOSE OF CONSULTATION:   History of tonsillar cancer, establishing with new provider.  ASSESSMENT & PLAN:  No problem-specific Assessment & Plan notes found for this encounter.  No orders of the defined types were placed in this encounter.  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  He had end of treatment PET/CT which showed no definitive evidence of residual disease. He denies any new health complaints, feels well, gained his weight back, eats most of the foods but has to be careful with certain meats.  He cannot tolerate spicy foods anymore. Physical examination no tonsillar mass noted, oral cavity appears clear.  No palpable cervical lymphadenopathy.  He has not established with an ENT doctor since his treatment and is very reluctant to undergo fiberoptic exam which is the main modality of surveillance in head and neck cancers.  I have strongly encouraged that he  establish with an ENT doctor for exam of the oropharynx.  He wants to proceed with imaging surveillance instead of fiberoptic exam, I explained to him that this is not standard of care and we do not do periodic imaging for follow-up in head and neck cancers.  He is not willing to do routine ENT exams. I also called his daughter and explained that he has low GFR, so with repeated CT imaging, he may have kidney injury. We agreed to repeat labs in July and if he has improvement in creatinine, then we will proceed with CT imaging for this time.  Lymphedema of the neck, he should continue massages. Dry mouth from post treatment, this is improving according to him Hearing loss, encouraged audiology follow up and hearing aids, he will consider Offered survivorship clinic, he doesn't want to go to many appointments.  HISTORY OF PRESENTING ILLNESS:   Omar Christian 65 y.o. male is here because of history of tonsillar cancer  Oncology History  Tonsillar cancer ([place])  01/14/2020 Initial Diagnosis   Squamous cell carcinoma with basaloid features   01/26/2020 PET scan   Right tonsillar mass extending to bilateral tongue base 4.9 cm.  Two right cervical lymph nodes metastases measuring 2.5 cm and 2.1 cm   02/17/2020 -  Chemotherapy   The patient had dexamethasone (DECADRON) 4 MG tablet, 8 mg, Oral, Daily, 1 of 1 cycle, Start date: 02/02/2020, End date: -- palonosetron (ALOXI) injection 0.25 mg, 0.25 mg, Intravenous,  Once, 3 of 3 cycles Administration: 0.25 mg (02/17/2020), 0.25 mg (03/02/2020), 0.25 mg (03/23/2020) CISplatin (PLATINOL) 173 mg in sodium chloride 0.9 % 500 mL chemo infusion, 75 mg/m2 = 173 mg (100 % of original dose 75 mg/m2), Intravenous,  Once, 3 of 3 cycles Dose modification:  75 mg/m2 (original dose 75 mg/m2, Cycle 1, Reason: Provider Judgment), 60 mg/m2 (original dose 75 mg/m2, Cycle 2, Reason: Dose not tolerated) Administration: 173 mg (02/17/2020), 138 mg (03/02/2020), 138 mg  (03/23/2020) fosaprepitant (EMEND) 150 mg in sodium chloride 0.9 % 145 mL IVPB, 150 mg, Intravenous,  Once, 3 of 3 cycles Administration: 150 mg (02/17/2020), 150 mg (03/02/2020), 150 mg (03/23/2020)  for chemotherapy treatment.    02/17/2020 Cancer Staging   Staging form: Pharynx - P16 Negative Oropharynx, AJCC 8th Edition - Clinical stage from 02/17/2020: Stage IVA (cT4a, cN2b, cM0, p16+) - Signed by [name], MD on 03/02/2020   Cancer of overlapping sites of tonsil [facility])  01/27/2020 Cancer Staging   Staging form: Pharynx - HPV-Mediated Oropharynx, AJCC 8th Edition - Clinical stage from 01/27/2020: Stage III (cT4, cN1, cM0, p16+) - Signed by [name], MD on 01/31/2020   01/31/2020 Initial Diagnosis   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[place] [facility])    PET CT 07/27/2020, mild residual mucosal metabolic activity right tonsil region most consistent with post therapy benign inflammation. Resolution hypermetabolic cervical adenopathy.   Interval history  He is here with his daughter. He is very hard of hearing even prior to chemo. No complaints today He can swallow most foods although he is sensitive to spicy foods. He has to be careful when he eats meat, he may need lot more water to push it down He denies any pain otherwise. Dry mouth when he wakes up, this seems to be getting better. No cough, chest pain, SOB. No change in bowel or urinary habits. No new neurological complaints.  REVIEW OF SYSTEMS:   Constitutional: Denies fevers, chills or abnormal night sweats Eyes: Denies blurriness of vision, double vision or watery eyes Ears, nose, mouth, throat, and face: Denies mucositis or sore throat Respiratory: Denies cough, dyspnea or wheezes Cardiovascular: Denies palpitation, chest discomfort or lower extremity swelling Gastrointestinal:  Denies nausea, heartburn or change in bowel habits Skin: Denies abnormal skin rashes Lymphatics: Denies new lymphadenopathy or easy  bruising Neurological:Denies numbness, tingling or new weaknesses Behavioral/Psych: Mood is stable, no new changes  All other systems were reviewed with the patient and are negative.  MEDICAL HISTORY:  Past Medical History:  Diagnosis Date  . Abnormal findings on cardiac catheterization 09/30/15   [facility]  . Abnormal myocardial perfusion study 09/24/15   [facility]  . CAD, multiple vessel 09/30/15   per CATH @ [facility]/DR.PARACHOS  . Coronary artery disease   . GERD (gastroesophageal reflux disease)   . Gout   . H/O echocardiogram 09/24/15   [place]  . Hard of hearing   . History of pneumonia   . Hyperlipidemia   . Hypertension   . Pneumonia   . Progressive angina ([place])   . 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 CARDIAC CATHETERIZATION N/A 09/30/2015   Procedure: Left Heart Cath and Coronary Angiography;  Surgeon: [name], MD;  Location: [place] CV LAB;  Service: Cardiovascular;  Laterality: N/A;  . CORONARY ARTERY BYPASS GRAFT N/A 10/08/2015   Procedure: CORONARY ARTERY BYPASS GRAFTING time 4 using left internal mammary and right greater saphenous vein harvested by endovein;  Surgeon: [name], MD;  Location: [place];  Service: Open Heart Surgery;  Laterality: N/A;  . CORONARY ARTERY BYPASS GRAFT    . IR GASTROSTOMY TUBE MOD SED  02/10/2020  . IR  GASTROSTOMY TUBE REMOVAL  10/12/2020  . IR IMAGING GUIDED PORT INSERTION  02/10/2020  . IR REMOVAL TUN ACCESS W/ PORT W/O FL MOD SED  10/12/2020  . MULTIPLE EXTRACTIONS WITH ALVEOLOPLASTY N/A 02/04/2020   Procedure: Extraction of tooth #'s [phone], and 32 with alveoloplasty and gross debridement of remaining teeth.;  Surgeon: Charlynne Pander, DDS;  Location: [facility] OR;  Service: Oral Surgery;  Laterality: N/A;  . TEE WITHOUT CARDIOVERSION N/A 10/08/2015   Procedure: TRANSESOPHAGEAL ECHOCARDIOGRAM (TEE);   Surgeon: Delight Ovens, MD;  Location: [facility] OR;  Service: Open Heart Surgery;  Laterality: N/A;  . VASECTOMY    . WISDOM TOOTH EXTRACTION      SOCIAL HISTORY: Social History   Socioeconomic History  . Marital status: Married    Spouse name: Not on file  . Number of children: 2  . Years of education: Not on file  . Highest education level: Not on file  Occupational History  . Not on file  Tobacco Use  . Smoking status: Never Smoker  . Smokeless tobacco: Never Used  Vaping Use  . Vaping Use: Never used  Substance and Sexual Activity  . Alcohol use: No  . Drug use: No  . Sexual activity: Not Currently    Birth control/protection: Surgical    Comment: Vasectomy  Other Topics Concern  . Not on file  Social History Narrative  . Not on file   Social Determinants of Health   Financial Resource Strain: Low Risk   . Difficulty of Paying Living Expenses: Not very hard  Food Insecurity: Not on file  Transportation Needs: No Transportation Needs  . Lack of Transportation (Medical): No  . Lack of Transportation (Non-Medical): No  Physical Activity: Not on file  Stress: Not on file  Social Connections: Not on file  Intimate Partner Violence: Not on file    FAMILY HISTORY: Family History  Problem Relation Age of Onset  . Heart disease Mother   . Hypertension Mother   . Alcohol abuse Father   . Throat cancer Father   . Muscular dystrophy Brother   . Muscular dystrophy Brother     ALLERGIES:  has No Known Allergies.  MEDICATIONS:  Current Outpatient Medications  Medication Sig Dispense Refill  . allopurinol (ZYLOPRIM) 100 MG tablet TAKE 1 TABLET BY MOUTH EVERY DAY 90 tablet 0  . atorvastatin (LIPITOR) 20 MG tablet Take 1 tablet (20 mg total) by mouth daily. (Patient not taking: Reported on 07/28/2020) 90 tablet 3  . dexamethasone (DECADRON) 4 MG tablet Take 2 tablets (8 mg total) by mouth daily. Take daily for 3 days after chemo. Take with food. 20 tablet 0  .  guaiFENesin (ROBITUSSIN) 100 MG/5ML liquid Take 20 mLs (400 mg total) by mouth 3 (three) times daily as needed for cough. May loosen secretions. (Patient not taking: Reported on 07/28/2020) 1000 mL 3  . lidocaine (XYLOCAINE) 2 % solution Patient: Mix 1part 2% viscous lidocaine, 1part H20. Swish & swallow 33mL of diluted mixture, before eating and at bedtime, up to 5xs daily. 200 mL 4  . lidocaine-prilocaine (EMLA) cream Apply to affected area once 30 g 3  . LORazepam (ATIVAN) 0.5 MG tablet Take 1 tablet (0.5 mg total) by mouth at bedtime as needed (Nausea or vomiting). 30 tablet 0  . magic mouthwash w/lidocaine SOLN Take 5 mLs by mouth 5 (five) times daily as needed for mouth pain. 240 mL 1  . metoCLOPramide (REGLAN) 10 MG/10ML SOLN Take 5 mLs (5 mg  total) by mouth 3 (three) times daily before meals. 1200 mL 0  . metoprolol succinate (TOPROL XL) 25 MG 24 hr tablet Take 1 tablet (25 mg total) by mouth daily. (Patient not taking: Reported on 07/28/2020) 30 tablet 3  . MITIGARE 0.6 MG CAPS Take 1 capsule by mouth daily. (Patient not taking: Reported on 07/28/2020)    . Nutritional Supplements (KATE FARMS STANDARD 1.4) LIQD 488 mLs by Gastrostomy Tube route 4 (four) times daily. (Patient not taking: Reported on 07/28/2020) 2730 mL 6  . nystatin (MYCOSTATIN) 100000 UNIT/ML suspension TAKE 5 MLS (500,000 UNITS TOTAL) BY MOUTH IN THE MORNING, AT NOON, AND AT BEDTIME. 60 mL 0  . ondansetron (ZOFRAN) 8 MG tablet Take 1 tablet (8 mg total) by mouth 2 (two) times daily as needed. Start on the third day after chemotherapy. 30 tablet 1  . oxyCODONE-acetaminophen (PERCOCET/ROXICET) 5-325 MG tablet Take 1 tablet by mouth every 4 (four) hours as needed.     . prochlorperazine (COMPAZINE) 10 MG tablet Take 1 tablet (10 mg total) by mouth every 6 (six) hours as needed (Nausea or vomiting). 30 tablet 1  . scopolamine (TRANSDERM-SCOP) 1 MG/3DAYS Place 1 patch (1.5 mg total) onto the skin every 3 (three) days. 10 patch 2   . sodium fluoride (PREVIDENT 5000 PLUS) 1.1 % CREA dental cream Apply to tooth brush. Brush teeth for 2 minutes. Spit out excess-DO NOT swallow. DO NOT rinse afterwards. Repeat nightly. (Patient not taking: Reported on 07/28/2020) 51 g prn   No current facility-administered medications for this visit.    PHYSICAL EXAMINATION:  ECOG PERFORMANCE STATUS: 0 - Asymptomatic  Vitals:   10/27/20 1018  BP: (!) 149/87  Pulse: 75  Resp: 18  Temp: (!) 97 F (36.1 C)  SpO2: 99%   Filed Weights   10/27/20 1018  Weight: 200 lb 14.4 oz (91.1 kg)    GENERAL:alert, no distress and comfortable SKIN: skin color, texture, turgor are normal, no rashes or significant lesions EYES: normal, conjunctiva are pink and non-injected, sclera clear OROPHARYNX:no exudate, no erythema and lips, buccal mucosa, and tongue normal. No tonsillar enlargement noted NECK: supple, thyroid normal size, non-tender, without nodularity. Lymphedema noted. LYMPH:  no palpable lymphadenopathy in the cervical, axillary or inguinal LUNGS: clear to auscultation and percussion with normal breathing effort HEART: regular rate & rhythm and no murmurs and no lower extremity edema ABDOMEN:abdomen soft, non-tender and normal bowel sounds Musculoskeletal:no cyanosis of digits and no clubbing  PSYCH: alert & oriented x 3 with fluent speech NEURO: no focal motor/sensory deficits  LABORATORY DATA:  I have reviewed the data as listed Lab Results  Component Value Date   WBC 3.7 (L) 10/12/2020   HGB 12.7 (L) 10/12/2020   HCT 37.8 (L) 10/12/2020   MCV 89.4 10/12/2020   PLT 138 (L) 10/12/2020     Chemistry      Component Value Date/Time   NA 138 10/12/2020 1000   NA 141 09/19/2019 0950   K 4.2 10/12/2020 1000   CL 105 10/12/2020 1000   CO2 23 10/12/2020 1000   BUN 18 10/12/2020 1000   BUN 9 09/19/2019 0950   CREATININE 1.41 (H) 10/12/2020 1000   CREATININE 1.50 (H) 06/02/2020 1358   GLU 114 09/03/2013 0000      Component  Value Date/Time   CALCIUM 9.7 10/12/2020 1000   ALKPHOS 69 06/02/2020 1358   AST 15 06/02/2020 1358   ALT 8 06/02/2020 1358   BILITOT 0.6 06/02/2020 1358  RADIOGRAPHIC STUDIES: I have personally reviewed the radiological images as listed and agreed with the findings in the report. IR Removal Tun Access W/ Port W/O [place]  Result Date: 10/12/2020 INDICATION: 65 year old male with a history of head and neck tonsillar carcinoma. Percutaneous gastrostomy tube and right chest port catheter were placed by interventional radiology on 02/10/2020. Patient has now completed his chemotherapy and radiation therapy and is in disease remission. He presents for removal of both the portacatheter and the percutaneous gastrostomy tube. EXAM: REMOVAL RIGHT IJ VEIN PORT-A-CATH MEDICATIONS: None. ANESTHESIA/SEDATION: Moderate (conscious) sedation was employed during this procedure. A total of Versed 4 mg and Fentanyl 100 mcg was administered intravenously. Moderate Sedation Time: 10 minutes. The patient's level of consciousness and vital signs were monitored continuously by radiology nursing throughout the procedure under my direct supervision. FLUOROSCOPY TIME:  None. COMPLICATIONS: None immediate. PROCEDURE: Informed written consent was obtained from the patient after a thorough discussion of the procedural risks, benefits and alternatives. All questions were addressed. Maximal Sterile Barrier Technique was utilized including caps, mask, sterile gowns, sterile gloves, sterile drape, hand hygiene and skin antiseptic. A timeout was performed prior to the initiation of the procedure. The right chest was prepped and draped in a sterile fashion. Lidocaine was utilized for local anesthesia. An incision was made over the previously healed surgical incision. Utilizing blunt dissection, the port catheter and reservoir were removed from the underlying subcutaneous tissue in their entirety. Securing sutures were also removed. The  pocket was irrigated with a copious amount of sterile normal saline. The pocket was closed with interrupted 3-0 Vicryl stitches. The subcutaneous tissue was closed with 3-0 Vicryl interrupted subcutaneous stitches. Dermabond was applied. The percutaneous gastrostomy tube was removed with gentle but firm traction. IMPRESSION: Successful right IJ vein Port-A-Cath explantation. Removal of percutaneous gastrostomy tube. Electronically Signed   By: Jacqulynn Cadet M.D.   On: 10/12/2020 12:49   IR GASTROSTOMY TUBE REMOVAL  Result Date: 10/12/2020 INDICATION: 65 year old male with a history of head and neck tonsillar carcinoma. Percutaneous gastrostomy tube and right chest port catheter were placed by interventional radiology on 02/10/2020. Patient has now completed his chemotherapy and radiation therapy and is in disease remission. He presents for removal of both the portacatheter and the percutaneous gastrostomy tube. EXAM: REMOVAL RIGHT IJ VEIN PORT-A-CATH MEDICATIONS: None. ANESTHESIA/SEDATION: Moderate (conscious) sedation was employed during this procedure. A total of Versed 4 mg and Fentanyl 100 mcg was administered intravenously. Moderate Sedation Time: 10 minutes. The patient's level of consciousness and vital signs were monitored continuously by radiology nursing throughout the procedure under my direct supervision. FLUOROSCOPY TIME:  None. COMPLICATIONS: None immediate. PROCEDURE: Informed written consent was obtained from the patient after a thorough discussion of the procedural risks, benefits and alternatives. All questions were addressed. Maximal Sterile Barrier Technique was utilized including caps, mask, sterile gowns, sterile gloves, sterile drape, hand hygiene and skin antiseptic. A timeout was performed prior to the initiation of the procedure. The right chest was prepped and draped in a sterile fashion. Lidocaine was utilized for local anesthesia. An incision was made over the previously healed  surgical incision. Utilizing blunt dissection, the port catheter and reservoir were removed from the underlying subcutaneous tissue in their entirety. Securing sutures were also removed. The pocket was irrigated with a copious amount of sterile normal saline. The pocket was closed with interrupted 3-0 Vicryl stitches. The subcutaneous tissue was closed with 3-0 Vicryl interrupted subcutaneous stitches. Dermabond was applied. The percutaneous gastrostomy tube was removed  with gentle but firm traction. IMPRESSION: Successful right IJ vein Port-A-Cath explantation. Removal of percutaneous gastrostomy tube. Electronically Signed   By: Jacqulynn Cadet M.D.   On: 10/12/2020 12:49    All questions were answered. The patient knows to call the clinic with any problems, questions or concerns. I spent 45 minutes in the care of this patient including H and P, review of records, counseling and coordination of care.     Benay Pike, MD 10/27/2020 10:24 AM

## 2020-10-27 NOTE — Telephone Encounter (Signed)
Scheduled follow-up appointment per 4/27 los. Patient is aware. Cancelled upcoming port flush appointment due to patient no longer having a port. Patient's daughter stated they would call back to schedule the head/neck survivorship appointment.

## 2020-11-01 ENCOUNTER — Other Ambulatory Visit: Payer: Self-pay | Admitting: Family Medicine

## 2020-11-01 DIAGNOSIS — E790 Hyperuricemia without signs of inflammatory arthritis and tophaceous disease: Secondary | ICD-10-CM

## 2020-11-01 NOTE — Telephone Encounter (Signed)
Requested medication (s) are due for refill today: no  Requested medication (s) are on the active medication list: yes   Last refill:  07/31/2020  Future visit scheduled: no  Notes to clinic:  overdue for follow up and labs   Requested Prescriptions  Pending Prescriptions Disp Refills   allopurinol (ZYLOPRIM) 100 MG tablet [Pharmacy Med Name: ALLOPURINOL 100 MG TABLET] 90 tablet 0    Sig: TAKE [place]      Endocrinology:  Gout Agents Failed - 11/01/2020  1:33 AM      Failed - Uric Acid in normal range and within 360 days    Uric Acid  Date Value Ref Range Status  09/19/2019 6.4 3.8 - 8.4 mg/dL Final    Comment:               Therapeutic target for gout patients: <6.0          Failed - Cr in normal range and within 360 days    Creatinine  Date Value Ref Range Status  06/02/2020 1.50 (H) 0.61 - 1.24 mg/dL Final   Creatinine, Ser  Date Value Ref Range Status  10/12/2020 1.41 (H) 0.61 - 1.24 mg/dL Final          Failed - Valid encounter within last 12 months    Recent Outpatient Visits           1 year ago Subacute sinusitis, unspecified location   [place], PA-C   1 year ago Foot pain, right   [facility] Birdie Sons, MD   3 years ago Generalized abdominal pain   [place], Vickki Muff, PA-C   4 years ago Influenza A   [facility], Vickki Muff, PA-C   5 years ago Other chest pain   [facility], Vickki Muff, [place]

## 2020-12-20 ENCOUNTER — Telehealth: Payer: Self-pay | Admitting: Hematology and Oncology

## 2020-12-20 NOTE — Telephone Encounter (Signed)
Called pt to r/s app, no answer. VM was full, unable to leave msg. Will call back tomorrow.

## 2020-12-21 ENCOUNTER — Telehealth: Payer: Self-pay | Admitting: Hematology and Oncology

## 2020-12-21 NOTE — Telephone Encounter (Signed)
Attempted to contact pt again to r/s appt per 6/20 sch msg. No answer and vm is full.

## 2020-12-27 ENCOUNTER — Telehealth: Payer: Self-pay | Admitting: Hematology and Oncology

## 2020-12-27 NOTE — Telephone Encounter (Signed)
Called pt to sch appt per 6/27 sch msg. NO answer and no vm available. Will try to call pt again tomorrow.

## 2020-12-29 ENCOUNTER — Telehealth: Payer: Self-pay | Admitting: Hematology and Oncology

## 2020-12-29 NOTE — Telephone Encounter (Signed)
Pts' daughter [name] called and left a vm for me to call back about pt's schedule. I called, no answer and vm was full, unable to leave a msg.

## 2020-12-29 NOTE — Telephone Encounter (Signed)
Attempted to contact pt again today per 6/27 sch msg. No answer and vm was full, unable to leave a msg.

## 2021-01-18 ENCOUNTER — Other Ambulatory Visit: Payer: Self-pay

## 2021-01-18 ENCOUNTER — Inpatient Hospital Stay: Payer: 59

## 2021-01-18 ENCOUNTER — Encounter: Payer: Self-pay | Admitting: Hematology and Oncology

## 2021-01-18 ENCOUNTER — Inpatient Hospital Stay: Payer: 59 | Attending: Hematology and Oncology | Admitting: Hematology and Oncology

## 2021-01-18 VITALS — BP 157/80 | HR 74 | Temp 98.5°F | Resp 18 | Ht 72.0 in | Wt 213.5 lb

## 2021-01-18 DIAGNOSIS — C098 Malignant neoplasm of overlapping sites of tonsil: Secondary | ICD-10-CM | POA: Insufficient documentation

## 2021-01-18 DIAGNOSIS — C099 Malignant neoplasm of tonsil, unspecified: Secondary | ICD-10-CM | POA: Diagnosis not present

## 2021-01-18 LAB — CBC WITH DIFFERENTIAL/PLATELET
Abs Immature Granulocytes: 0.01 10*3/uL (ref 0.00–0.07)
Basophils Absolute: 0 10*3/uL (ref 0.0–0.1)
Basophils Relative: 1 %
Eosinophils Absolute: 0 10*3/uL (ref 0.0–0.5)
Eosinophils Relative: 1 %
HCT: 34.7 % — ABNORMAL LOW (ref 39.0–52.0)
Hemoglobin: 11.9 g/dL — ABNORMAL LOW (ref 13.0–17.0)
Immature Granulocytes: 0 %
Lymphocytes Relative: 17 %
Lymphs Abs: 0.5 10*3/uL — ABNORMAL LOW (ref 0.7–4.0)
MCH: 30.4 pg (ref 26.0–34.0)
MCHC: 34.3 g/dL (ref 30.0–36.0)
MCV: 88.5 fL (ref 80.0–100.0)
Monocytes Absolute: 0.2 10*3/uL (ref 0.1–1.0)
Monocytes Relative: 8 %
Neutro Abs: 2.2 10*3/uL (ref 1.7–7.7)
Neutrophils Relative %: 73 %
Platelets: 117 10*3/uL — ABNORMAL LOW (ref 150–400)
RBC: 3.92 MIL/uL — ABNORMAL LOW (ref 4.22–5.81)
RDW: 13.5 % (ref 11.5–15.5)
WBC: 3.1 10*3/uL — ABNORMAL LOW (ref 4.0–10.5)
nRBC: 0 % (ref 0.0–0.2)

## 2021-01-18 LAB — COMPREHENSIVE METABOLIC PANEL
ALT: 15 U/L (ref 0–44)
AST: 18 U/L (ref 15–41)
Albumin: 3.9 g/dL (ref 3.5–5.0)
Alkaline Phosphatase: 66 U/L (ref 38–126)
Anion gap: 8 (ref 5–15)
BUN: 17 mg/dL (ref 8–23)
CO2: 27 mmol/L (ref 22–32)
Calcium: 9.4 mg/dL (ref 8.9–10.3)
Chloride: 104 mmol/L (ref 98–111)
Creatinine, Ser: 1.39 mg/dL — ABNORMAL HIGH (ref 0.61–1.24)
GFR, Estimated: 57 mL/min — ABNORMAL LOW (ref >60)
Glucose, Bld: 107 mg/dL — ABNORMAL HIGH (ref 70–99)
Potassium: 4.5 mmol/L (ref 3.5–5.1)
Sodium: 139 mmol/L (ref 135–145)
Total Bilirubin: 0.6 mg/dL (ref 0.3–1.2)
Total Protein: 6.7 g/dL (ref 6.5–8.1)

## 2021-01-18 LAB — TSH: TSH: 2.298 u[IU]/mL (ref 0.320–4.118)

## 2021-01-18 NOTE — Progress Notes (Signed)
[place] CONSULT NOTE  Patient Care Team: Chrismon, Vickki Muff, PA-C as PCP - General (Family Medicine) [name], MD (Inactive) as Consulting Physician (Cardiothoracic Surgery) [name], MD as Consulting Physician (Cardiology) [name], MD as Consulting Physician (Hematology and Oncology) [name], MD as Attending Physician (Radiation Oncology) [name], [name], RN as Oncology Nurse Navigator [name], [name], [place] as Speech Language Pathologist (Speech Pathology) [name], DDS as Consulting Physician (Dentistry) [name], [name], PT as Physical Therapist (Physical Therapy) [name], LCSW as Social Worker (General Practice) [name], LCSW as Social Worker [name], RD as Dietitian (Nutrition)  CHIEF COMPLAINTS/PURPOSE OF CONSULTATION:   History of tonsillar cancer, follow up  ASSESSMENT & PLAN:   Orders Placed This Encounter  Procedures   CBC with Differential/Platelet    Standing Status:   Standing    Number of Occurrences:   22    Standing Expiration Date:   01/18/2022   Comprehensive metabolic panel    Standing Status:   Standing    Number of Occurrences:   33    Standing Expiration Date:   01/18/2022   TSH    Standing Status:   Standing    Number of Occurrences:   22    Standing Expiration Date:   01/18/2022    This is a very pleasant 65 year old male patient with 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  He had end of treatment PET/CT which showed no definitive evidence of residual disease. He is here for FU. During our last visit, I recommended that he continue surveillance with ENT He was stubborn about not going to ENT visit. PE today no visible tonsilar mass on tongue depressor exam, no palpable  lymphadenopathy. I explained to him the role of ENT surveillance, there is no routine imaging recommended at this time. He understands that if he doesn't follow up with ENT, we may miss early recurrence. He would like to take his chances.  CBC, CMP and TSH today, FU in 6 months. Daughter will let us know if any new concerns arise.  HISTORY OF PRESENTING ILLNESS:   ALEXSANDER CAVINS 65 y.o. male is here because of history of tonsillar cancer  Oncology History  Tonsillar cancer ([place])  01/14/2020 Initial Diagnosis   Squamous cell carcinoma with basaloid features   01/26/2020 PET scan   Right tonsillar mass extending to bilateral tongue base 4.9 cm.  Two right cervical lymph nodes metastases measuring 2.5 cm and 2.1 cm   02/17/2020 -  Chemotherapy   The patient had dexamethasone (DECADRON) 4 MG tablet, 8 mg, Oral, Daily, 1 of 1 cycle, Start date: 02/02/2020, End date: -- palonosetron (ALOXI) injection 0.25 mg, 0.25 mg, Intravenous,  Once, 3 of 3 cycles Administration: 0.25 mg (02/17/2020), 0.25 mg (03/02/2020), 0.25 mg (03/23/2020) CISplatin (PLATINOL) 173 mg in sodium chloride 0.9 % 500 mL chemo infusion, 75 mg/m2 = 173 mg (100 % of original dose 75 mg/m2), Intravenous,  Once, 3 of 3 cycles Dose modification: 75 mg/m2 (original dose 75 mg/m2, Cycle 1, Reason: Provider Judgment), 60 mg/m2 (original dose 75 mg/m2, Cycle 2, Reason: Dose not tolerated) Administration: 173 mg (02/17/2020), 138 mg (03/02/2020), 138 mg (03/23/2020) fosaprepitant (EMEND) 150 mg in sodium chloride 0.9 % 145 mL IVPB, 150 mg, Intravenous,  Once, 3 of 3 cycles Administration: 150 mg (  02/17/2020), 150 mg (03/02/2020), 150 mg (03/23/2020)   for chemotherapy treatment.     02/17/2020 Cancer Staging   Staging form: Pharynx - P16 Negative Oropharynx, AJCC 8th Edition - Clinical stage from 02/17/2020: Stage IVA (cT4a, cN2b, cM0, p16+) - Signed by [name], MD on 03/02/2020    Cancer of overlapping sites of tonsil [facility])  01/27/2020  Cancer Staging   Staging form: Pharynx - HPV-Mediated Oropharynx, AJCC 8th Edition - Clinical stage from 01/27/2020: Stage III (cT4, cN1, cM0, p16+) - Signed by [name], MD on 01/31/2020    01/31/2020 Initial Diagnosis   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[place])     PET CT 07/27/2020, mild residual mucosal metabolic activity right tonsil region most consistent with post therapy benign inflammation. Resolution hypermetabolic cervical adenopathy.   Interval history  He is here with his daughter. He is very hard of hearing even prior to chemo. No complaints today He doesn't want to go to ENT doctor, he understands its important but doesn't want to do it He feels well, gained over 10 lbs weight, can eat well. Dry mouth is better. He is not doing neck massages anymore. Rest of the pertinent 10 point ROS reviewed and negative.  MEDICAL HISTORY:  Past Medical History:  Diagnosis Date   Abnormal findings on cardiac catheterization 09/30/15   [facility]   Abnormal myocardial perfusion study 09/24/15   [place]   CAD, multiple vessel 09/30/15   per CATH @ [facility]/DR.PARACHOS   Coronary artery disease    GERD (gastroesophageal reflux disease)    Gout    H/O echocardiogram 09/24/15   [facility]   Hard of hearing    History of pneumonia    Hyperlipidemia    Hypertension    Pneumonia    Progressive angina ([place])    Tonsil cancer ([place])    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    SURGICAL HISTORY: Past Surgical History:  Procedure Laterality Date   CARDIAC CATHETERIZATION N/A 09/30/2015   Procedure: Left Heart Cath and Coronary Angiography;  Surgeon: [name], MD;  Location: [place] [facility];  Service: Cardiovascular;  Laterality: N/A;   CORONARY ARTERY BYPASS GRAFT N/A 10/08/2015   Procedure: CORONARY ARTERY BYPASS GRAFTING time 4 using left internal mammary and right greater saphenous vein harvested by endovein;  Surgeon: [name], MD;  Location: [place];   Service: Open Heart Surgery;  Laterality: N/A;   CORONARY ARTERY BYPASS GRAFT     IR GASTROSTOMY TUBE MOD SED  02/10/2020   IR GASTROSTOMY TUBE REMOVAL  10/12/2020   IR IMAGING GUIDED PORT INSERTION  02/10/2020   IR REMOVAL TUN ACCESS W/ PORT W/O FL MOD SED  10/12/2020   MULTIPLE EXTRACTIONS WITH ALVEOLOPLASTY N/A 02/04/2020   Procedure: Extraction of tooth #'s [phone], and 32 with alveoloplasty and gross debridement of remaining teeth.;  Surgeon: [name], DDS;  Location: [place];  Service: Oral Surgery;  Laterality: N/A;   TEE WITHOUT CARDIOVERSION N/A 10/08/2015   Procedure: TRANSESOPHAGEAL ECHOCARDIOGRAM (TEE);  Surgeon: [name], MD;  Location: [place];  Service: Open Heart Surgery;  Laterality: N/A;   VASECTOMY     WISDOM TOOTH EXTRACTION      SOCIAL HISTORY: Social History   Socioeconomic History   Marital status: Married    Spouse name: Not on file   Number of children: 2   Years of education: Not on file   Highest education level: Not on file  Occupational History  Not on file  Tobacco Use   Smoking status: Never   Smokeless tobacco: Never  Vaping Use   Vaping Use: Never used  Substance and Sexual Activity   Alcohol use: No   Drug use: No   Sexual activity: Not Currently    Birth control/protection: Surgical    Comment: Vasectomy  Other Topics Concern   Not on file  Social History Narrative   Not on file   Social Determinants of Health   Financial Resource Strain: Low Risk    Difficulty of Paying Living Expenses: Not very hard  Food Insecurity: Not on file  Transportation Needs: No Transportation Needs   Lack of Transportation (Medical): No   Lack of Transportation (Non-Medical): No  Physical Activity: Not on file  Stress: Not on file  Social Connections: Not on file  Intimate Partner Violence: Not on file    FAMILY HISTORY: Family History  Problem Relation Age of Onset   Heart disease Mother    Hypertension Mother    Alcohol abuse  Father    Throat cancer Father    Muscular dystrophy Brother    Muscular dystrophy Brother     ALLERGIES:  has No Known Allergies.  MEDICATIONS:  Current Outpatient Medications  Medication Sig Dispense Refill   allopurinol (ZYLOPRIM) 100 MG tablet TAKE 1 TABLET BY MOUTH EVERY DAY 90 tablet 0   oxyCODONE-acetaminophen (PERCOCET/ROXICET) 5-325 MG tablet Take 1 tablet by mouth every 4 (four) hours as needed.      scopolamine (TRANSDERM-SCOP) 1 MG/3DAYS Place 1 patch (1.5 mg total) onto the skin every 3 (three) days. 10 patch 2   No current facility-administered medications for this visit.    PHYSICAL EXAMINATION:  ECOG PERFORMANCE STATUS: 0 - Asymptomatic  Vitals:   01/18/21 0936  BP: (!) 157/80  Pulse: 74  Resp: 18  Temp: 98.5 F (36.9 C)  SpO2: 100%    Filed Weights   01/18/21 0936  Weight: 213 lb 8 oz (96.8 kg)   GENERAL:alert, no distress and comfortable SKIN: skin color, texture, turgor are normal, no rashes or significant lesions EYES: normal, conjunctiva are pink and non-injected, sclera clear OROPHARYNX:no exudate, no erythema and lips, buccal mucosa, and tongue normal. No tonsillar enlargement noted NECK: supple, thyroid normal size, non-tender, without nodularity. Lymphedema noted. LYMPH:  no palpable lymphadenopathy in the cervical, axillary or inguinal LUNGS: clear to auscultation and percussion with normal breathing effort HEART: regular rate & rhythm and no murmurs and no lower extremity edema ABDOMEN:abdomen soft, non-tender and normal bowel sounds Musculoskeletal:no cyanosis of digits and no clubbing  PSYCH: alert & oriented x 3 with fluent speech NEURO: no focal motor/sensory deficits  LABORATORY DATA:  I have reviewed the data as listed Lab Results  Component Value Date   WBC 3.7 (L) 10/12/2020   HGB 12.7 (L) 10/12/2020   HCT 37.8 (L) 10/12/2020   MCV 89.4 10/12/2020   PLT 138 (L) 10/12/2020     Chemistry      Component Value Date/Time   NA  138 10/12/2020 1000   NA 141 09/19/2019 0950   K 4.2 10/12/2020 1000   CL 105 10/12/2020 1000   CO2 23 10/12/2020 1000   BUN 18 10/12/2020 1000   BUN 9 09/19/2019 0950   CREATININE 1.41 (H) 10/12/2020 1000   CREATININE 1.50 (H) 06/02/2020 1358   GLU 114 09/03/2013 0000      Component Value Date/Time   CALCIUM 9.7 10/12/2020 1000   ALKPHOS 69 06/02/2020 1358  AST 15 06/02/2020 1358   ALT 8 06/02/2020 1358   BILITOT 0.6 06/02/2020 1358      RADIOGRAPHIC STUDIES: I have personally reviewed the radiological images as listed and agreed with the findings in the report. No results found.   All questions were answered. The patient knows to call the clinic with any problems, questions or concerns. I spent 30 minutes in the care of this patient including H and P, review of records, counseling and coordination of care. Again discussed role of surveillance with ENT and monitoring with [place] semi annually with labs.    Benay Pike, MD 01/18/2021 10:18 AM

## 2021-01-19 ENCOUNTER — Ambulatory Visit: Payer: 59 | Admitting: Hematology and Oncology

## 2021-01-29 ENCOUNTER — Other Ambulatory Visit: Payer: Self-pay | Admitting: Family Medicine

## 2021-01-29 DIAGNOSIS — E790 Hyperuricemia without signs of inflammatory arthritis and tophaceous disease: Secondary | ICD-10-CM

## 2021-01-29 NOTE — Telephone Encounter (Signed)
Allopurinol last RF 11/01/20 #90 Needs office visit and lab work. Will send pt MyChart message to make appt for refills.  Requested Prescriptions  Pending Prescriptions Disp Refills   allopurinol (ZYLOPRIM) 100 MG tablet [Pharmacy Med Name: ALLOPURINOL 100 MG TABLET] 90 tablet 0    Sig: TAKE 1 TABLET BY MOUTH EVERY DAY      Endocrinology:  Gout Agents Failed - 01/29/2021  5:46 PM      Failed - Uric Acid in normal range and within 360 days    Uric Acid  Date Value Ref Range Status  09/19/2019 6.4 3.8 - 8.4 mg/dL Final    Comment:               Therapeutic target for gout patients: <6.0          Failed - Cr in normal range and within 360 days    Creatinine  Date Value Ref Range Status  06/02/2020 1.50 (H) 0.61 - 1.24 mg/dL Final   Creatinine, Ser  Date Value Ref Range Status  01/18/2021 1.39 (H) 0.61 - 1.24 mg/dL Final          Failed - Valid encounter within last 12 months    Recent Outpatient Visits           1 year ago Subacute sinusitis, unspecified location   [place], PA-C   1 year ago Foot pain, right   [facility] Birdie Sons, MD   4 years ago Generalized abdominal pain   [place], Vickki Muff, PA-C   4 years ago Influenza A   [employer] [facility], Vickki Muff, PA-C   5 years ago Other chest pain   [employer], Vickki Muff, [place]

## 2021-02-07 ENCOUNTER — Ambulatory Visit (HOSPITAL_COMMUNITY)
Admission: RE | Admit: 2021-02-07 | Discharge: 2021-02-07 | Disposition: A | Discharge status: Home / Self Care | Payer: 59 | Source: Ambulatory Visit | Attending: Hematology and Oncology | Admitting: Hematology and Oncology

## 2021-02-07 ENCOUNTER — Other Ambulatory Visit: Payer: Self-pay

## 2021-02-07 DIAGNOSIS — C099 Malignant neoplasm of tonsil, unspecified: Secondary | ICD-10-CM | POA: Insufficient documentation

## 2021-02-07 MED ORDER — IOHEXOL 350 MG/ML SOLN
65.0000 mL | Freq: Once | INTRAVENOUS | Status: AC | PRN
  Administered 2021-02-07: 65 mL via INTRAVENOUS

## 2021-06-30 ENCOUNTER — Encounter: Payer: Self-pay | Admitting: Hematology and Oncology

## 2021-07-19 ENCOUNTER — Ambulatory Visit: Payer: 59 | Admitting: Hematology and Oncology

## 2021-09-12 ENCOUNTER — Encounter: Payer: Self-pay | Admitting: Hematology and Oncology

## 2021-09-14 ENCOUNTER — Ambulatory Visit: Payer: Self-pay | Admitting: Family Medicine

## 2021-09-14 ENCOUNTER — Other Ambulatory Visit: Payer: Self-pay

## 2021-09-14 ENCOUNTER — Ambulatory Visit (INDEPENDENT_AMBULATORY_CARE_PROVIDER_SITE_OTHER): Payer: Medicare HMO | Admitting: Family Medicine

## 2021-09-14 ENCOUNTER — Encounter: Payer: Self-pay | Admitting: Family Medicine

## 2021-09-14 ENCOUNTER — Ambulatory Visit: Payer: Self-pay

## 2021-09-14 VITALS — BP 165/89 | HR 81 | Temp 98.5°F | Resp 16

## 2021-09-14 DIAGNOSIS — J0110 Acute frontal sinusitis, unspecified: Secondary | ICD-10-CM

## 2021-09-14 DIAGNOSIS — J302 Other seasonal allergic rhinitis: Secondary | ICD-10-CM

## 2021-09-14 DIAGNOSIS — J069 Acute upper respiratory infection, unspecified: Secondary | ICD-10-CM

## 2021-09-14 MED ORDER — AMOXICILLIN 500 MG PO CAPS: 1000.0000 mg | ORAL_CAPSULE | Freq: Two times a day (BID) | ORAL | 0 refills | Status: AC

## 2021-09-14 NOTE — Telephone Encounter (Signed)
Pt called, explained to pt and wife that pt is to take 2 caps twice a day for 10 days. Pt verbalized understanding. ? ?Summary: fu to clarify to pt exact way to take med as was to start immediately  ? amoxicillin (AMOXIL) 500 MG capsule 40 capsule 0 09/14/2021 09/24/2021  ?Sig - Route: Take 2 capsules (1,000 mg total) by mouth 2 (two) times daily for 10 days. - Oral  ?Sent to pharmacy as: amoxicillin (AMOXIL) 500 MG capsule  ?E-Prescribing Status: Receipt confirmed by pharmacy (09/14/2021 10:48 AM EDT)  ? ?Pls call pt and explain script being to take a total of 2 tablets = 1,000 twice a day = 4 tablets a day for sum of 40 in 10 days. Wife has tried to explain to him, and he even went back up to [facility] and they tried to explain but pt does not understand. Wife wants a fu call to [phone] to make sure he understands in his own mind knowing he will not be taking too much medication. He is expecting the call.   ?  ? ? ?Reason for Disposition ? Caller has medicine question only, adult not sick, AND triager answers question ? ?Answer Assessment - Initial Assessment Questions ?1. NAME of MEDICATION: "What medicine are you calling about?" ?    amoxicillin ?2. QUESTION: "What is your question?" (e.g., double dose of medicine, side effect) ?    Needing clarification on dosage ?3. PRESCRIBING HCP: "Who prescribed it?" Reason: if prescribed by specialist, call should be referred to that group. ?  Dr. Caryn Section ? ?Protocols used: Medication Question Call-A-AH ? ?

## 2021-09-14 NOTE — Progress Notes (Signed)
?  ? ?I,Roshena L Chambers,acting as a scribe for Lelon Huh, MD.,have documented all relevant documentation on the behalf of Lelon Huh, MD,as directed by  Lelon Huh, MD while in the presence of Lelon Huh, MD.  ? ? ?Established patient visit ? ? ?Patient: Omar Christian   DOB: May 19, 1956   66 y.o. Male  MRN: [id number] ?Visit Date: 09/14/2021 ? ?Today's healthcare provider: Lelon Huh, MD  ? ?Chief Complaint  ?Patient presents with  ? Cough  ? ?Subjective  ?  ?Cough ?This is a new problem. Episode onset: 4-6 weeks ago. The problem occurs constantly. The cough is Productive of sputum (yellow and green colored sputum). Associated symptoms include shortness of breath (with exertion). Pertinent negatives include no chest pain, chills, fever or wheezing. Treatments tried: Mucinex and cough drops. The treatment provided no relief.  Has also been having persistent sinus congestion and drainage, with mild sinus pressure. Reports similar symptoms frequently around this time of year and is usually prescribed amoxicillin for sinus infection which is effective.  ? ? ?Medications: ?Outpatient Medications Prior to Visit  ?Medication Sig  ? allopurinol (ZYLOPRIM) 100 MG tablet TAKE 1 TABLET BY MOUTH EVERY DAY  ? [DISCONTINUED] oxyCODONE-acetaminophen (PERCOCET/ROXICET) 5-325 MG tablet Take 1 tablet by mouth every 4 (four) hours as needed.   ? [DISCONTINUED] scopolamine (TRANSDERM-SCOP) 1 MG/3DAYS Place 1 patch (1.5 mg total) onto the skin every 3 (three) days.  ? ?No facility-administered medications prior to visit.  ? ? ?Review of Systems  ?Constitutional:  Negative for appetite change, chills and fever.  ?HENT:  Positive for congestion.   ?Respiratory:  Positive for cough and shortness of breath (with exertion). Negative for chest tightness and wheezing.   ?Cardiovascular:  Negative for chest pain and palpitations.  ?Gastrointestinal:  Negative for abdominal pain, nausea and vomiting.  ? ? ?  Objective  ?  ?BP  (!) 165/89 (BP Location: Left Arm, Patient Position: Sitting, Cuff Size: Large)   Pulse 81   Temp 98.5 ?F (36.9 ?C) (Oral)   Resp 16   SpO2 98% Comment: room air ? ?Today's Vitals  ? 09/14/21 1033 09/14/21 1037  ?BP: (!) 149/88 (!) 165/89  ?Pulse: 81   ?Resp: 16   ?Temp: 98.5 ?F (36.9 ?C)   ?TempSrc: Oral   ?SpO2: 98%   ? ?There is no height or weight on file to calculate BMI.  ? ? ?Physical Exam  ?General Appearance:     ?Well developed, well nourished male, alert, cooperative, in no acute distress  ?HENT:   bilateral TM normal without fluid or infection, neck without nodes, frontal sinuses tender, post nasal drip noted, and nasal mucosa pale and congested  ?Eyes:    PERRL, conjunctiva/corneas clear, EOM's intact       ?Lungs:     Clear to auscultation bilaterally, respirations unlabored  ?Heart:    Normal heart rate. Normal rhythm. No murmurs, rubs, or gallops.    ?Neurologic:   Awake, alert, oriented x 3. No apparent focal neurological           defect.   ?   ?  ? Assessment & Plan  ?  ? ?1. Upper respiratory tract infection, unspecified type ? ? ?2. Acute non-recurrent frontal sinusitis ? ?- amoxicillin (AMOXIL) 500 MG capsule; Take 2 capsules (1,000 mg total) by mouth 2 (two) times daily for 10 days.  Dispense: 40 capsule; Refill: 0 ? ?3. Seasonal allergic rhinitis, unspecified trigger ?Recommend OTC allergy nasal spray and saline irrigation.  ?  Call if symptoms change or if not rapidly improving.  ?    ?   ? ?The entirety of the information documented in the History of Present Illness, Review of Systems and Physical Exam were personally obtained by me. Portions of this information were initially documented by the CMA and reviewed by me for thoroughness and accuracy.   ? ? ?Lelon Huh, MD  ?[facility] ?[phone] (phone) ?[phone] (fax) ? ?[facility]  ?

## 2021-09-14 NOTE — Patient Instructions (Addendum)
Please review the attached list of medications and notify my office if there are any errors.  ? ?Recommend using OTC fluticasone nasal spray (aka Flonase) and OTC nasal saline irrigation/rinses to help with allergies and clear pollen from sinuses ?

## 2021-12-24 ENCOUNTER — Other Ambulatory Visit: Payer: Self-pay | Admitting: Nurse Practitioner

## 2022-01-02 IMAGING — CT NM PET TUM IMG INITIAL (PI) SKULL BASE T - THIGH
7 series · 25 of 25 positions shown · non-contrast
Comparison: Maxillofacial CT dated 01/11/2020. CT abdomen/pelvis
dated 01/09/2017.

CLINICAL DATA: Initial treatment strategy for right head/neck
cancer.

EXAM:
NUCLEAR MEDICINE PET SKULL BASE TO THIGH
TECHNIQUE: 11.5 mCi F-18 FDG was injected intravenously. Full-ring PET imaging
was performed from the skull base to thigh after the radiotracer. CT
data was obtained and used for attenuation correction and anatomic
localization.
Fasting blood glucose: 123 mg/dl

[Series 3: pet hn_sk_thigh ac · axial · 5.0mm · 4.07mm/px · z∈[-322,+670]mm · 6 of 249 slices shown]
[im 1/249]
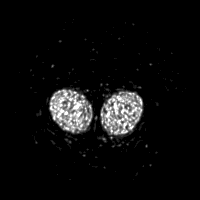
[im 50/249]
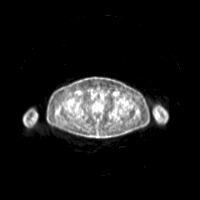
[im 100/249]
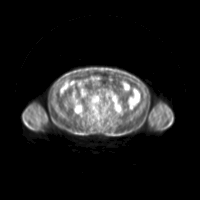
[im 149/249]
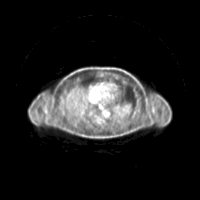
[im 199/249]
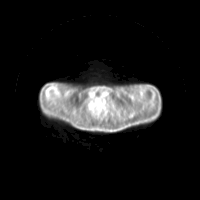
[im 249/249]
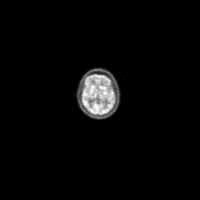

[Series 4: ct hn_sk_th 5.0 b31f · axial · 5.0mm · 0.98mm/px · z∈[-322,+670]mm · 5 of 249 slices shown]
[im 1/249]
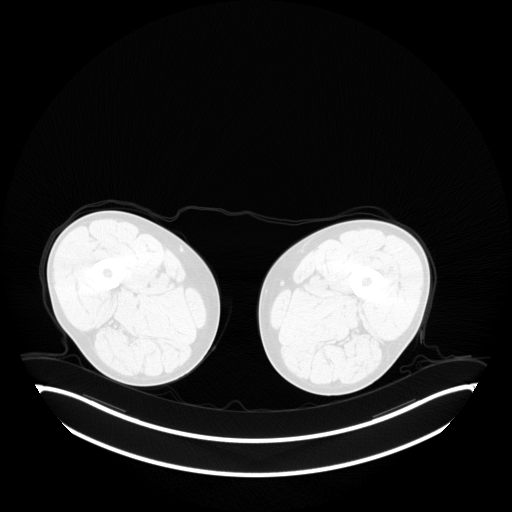
[im 63/249]
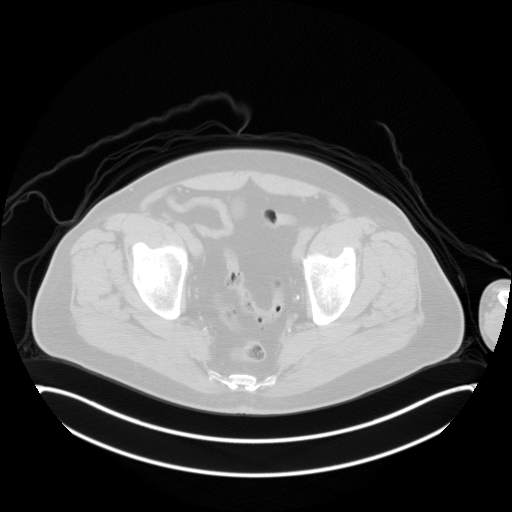
[im 125/249]
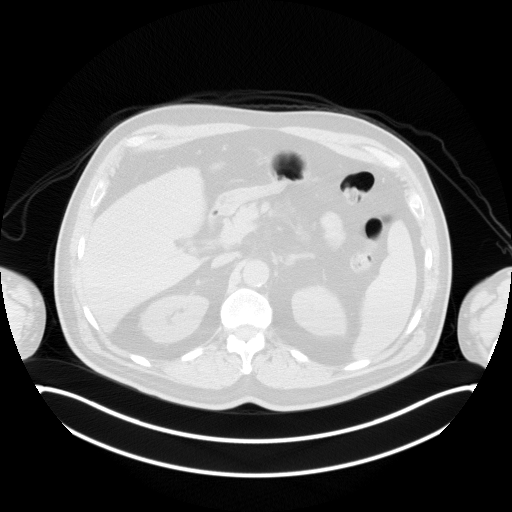
[im 187/249]
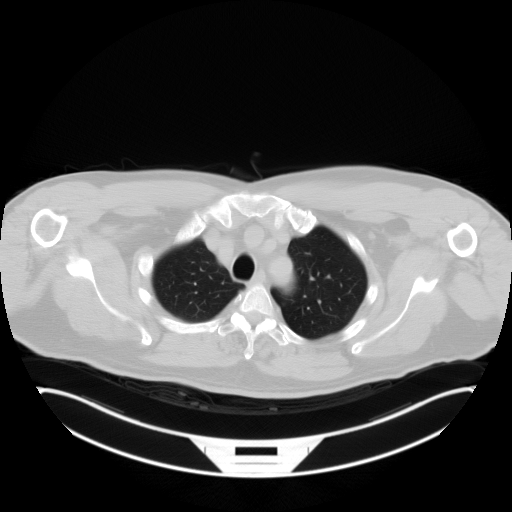
[im 249/249  brain]
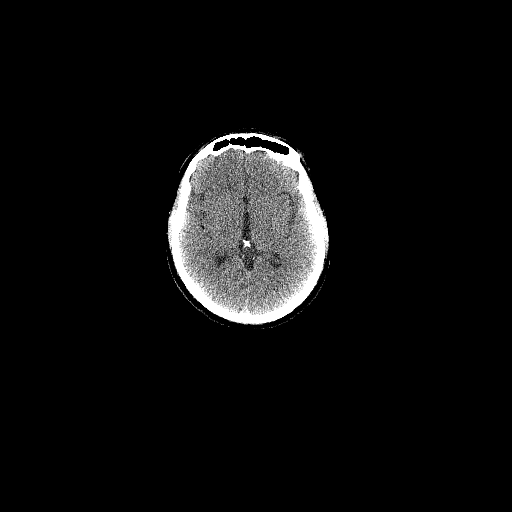

[Series 5: pet hn_sk_thigh nac · axial · 5.0mm · 4.07mm/px · z∈[-322,+670]mm · 5 of 249 slices shown]
[im 1/249]
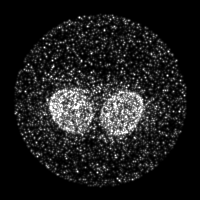
[im 63/249]
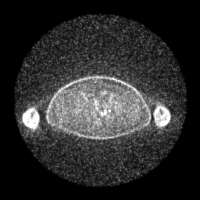
[im 125/249]
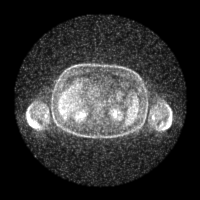
[im 187/249]
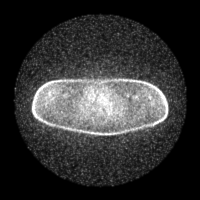
[im 249/249]
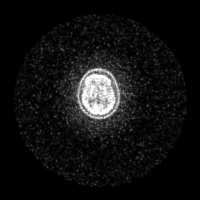

[Series 8: ct hn_sk_th 5.0 b70f lung_bone · axial · 5.0mm · 0.78mm/px · z∈[+188,+504]mm · 2 of 80 slices shown]
[im 1/80  bone]
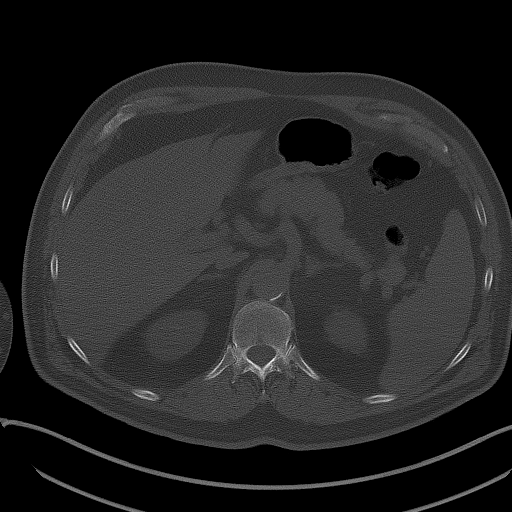
[im 80/80  soft-tissue]
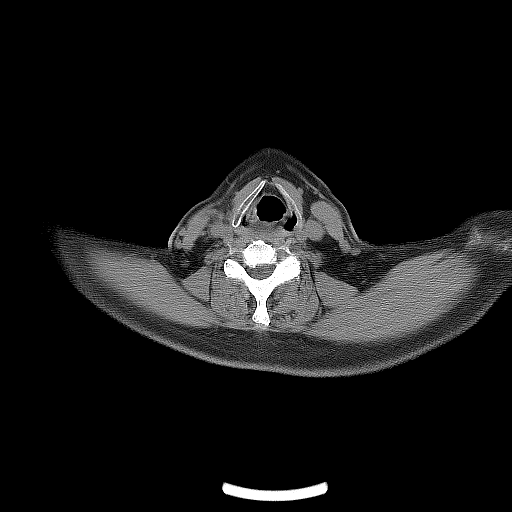

[Series 603: mip range 5 · coronal · 2.06mm/px · 1 of 32 slices shown]
[im 1/32]
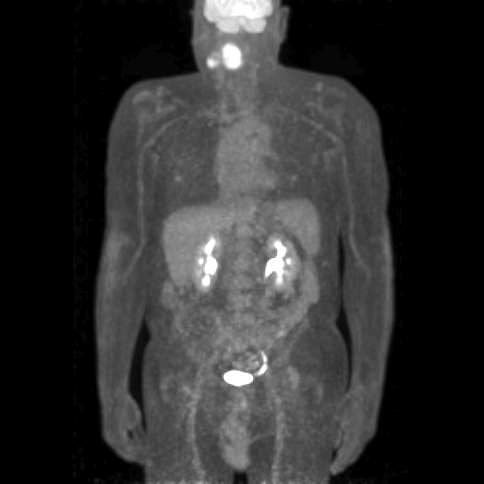

[Series 604: range-ct hn_sk_th 5.0 (id)<alpha range> · 1 of 66 slices shown (1 of 2)]
[im 1/66]
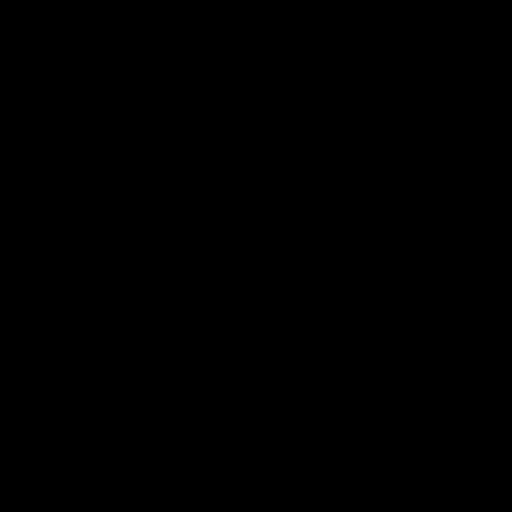

[Series 605: range-ct hn_sk_th 5.0 (id)<alpha range> · 5 of 239 slices shown (2 of 2)]
[im 1/239]
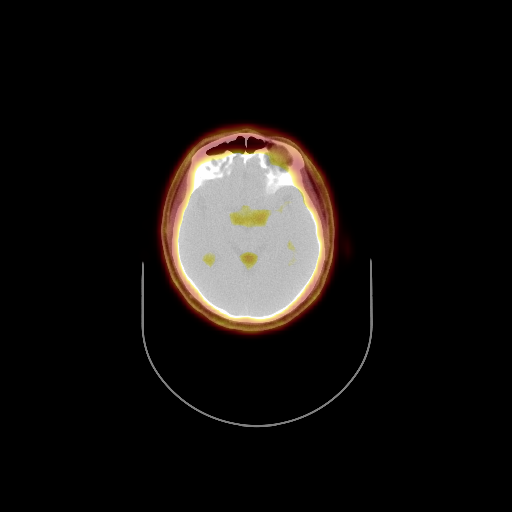
[im 60/239]
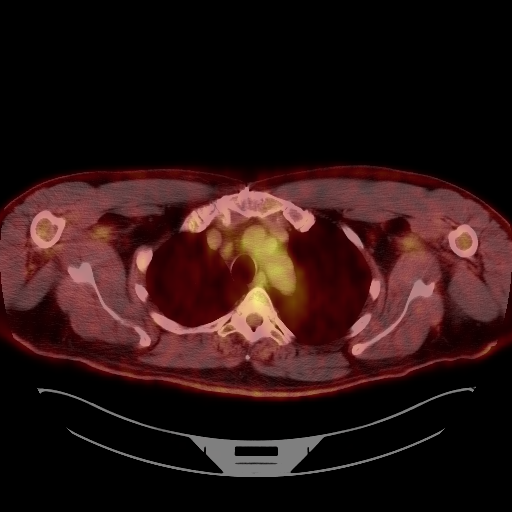
[im 120/239]
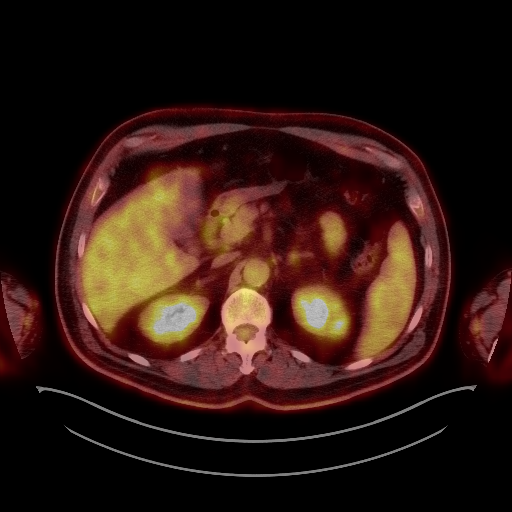
[im 179/239]
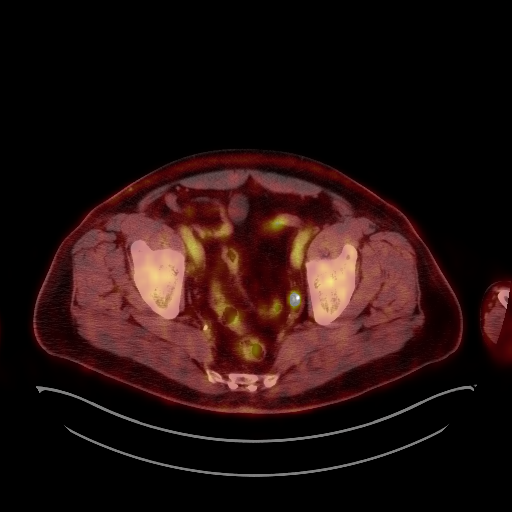
[im 239/239]
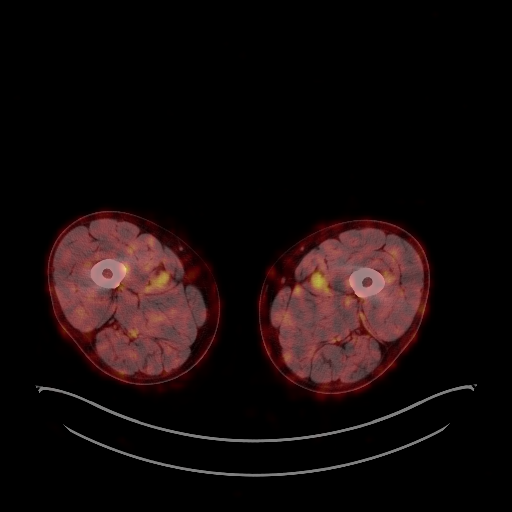

[25 of 25 positions shown; findings below may reference images not displayed]

FINDINGS: Mediastinal blood pool activity: SUV max

Liver activity: SUV max NA

NECK: Right tonsillar mass extending to the right base of tongue and
crossing midline to the left, measuring approximately 4.9 x 3.7 cm
in maximal axial measurement (series 4/image 29), max SUV 18.3,
compatible with primary head/neck cancer.

2.5 cm short axis right level 2 node (series 4/image 32), max SUV
12.5.

Additional 2.1 cm necrotic right level 2 node (series 4/image 37),
without appreciable hypermetabolism given necrosis, but also
suspicious for metastasis.

Incidental CT findings: none

CHEST: No hypermetabolic thoracic lymphadenopathy.

No suspicious pulmonary nodules.

Incidental CT findings: Mild atherosclerotic calcifications the
descending thoracic aorta.

3 vessel coronary atherosclerosis. Postsurgical changes related to
prior CABG.

ABDOMEN/PELVIS: No abnormal hypermetabolism in the liver, spleen,
pancreas, or adrenal glands.

No hypermetabolic abdominopelvic lymphadenopathy.

Incidental CT findings: 3.0 x 2.3 cm calcified jejunal mesenteric
lesion in the left mid abdomen (series 4/image 139), unchanged from
1225 and non FDG avid, of uncertain etiology but indolent and
therefore likely of no clinical significance given the additional
findings described above.

Mild hepatic steatosis with focal fatty sparing along the
gallbladder fossa. Cholelithiasis, without associated inflammatory
changes. Nonobstructing right renal calculi measuring 2-3 mm (series
4/image 139). Atherosclerotic calcifications of the abdominal aorta
and branch vessels.

SKELETON: No focal hypermetabolic activity to suggest skeletal
metastasis.

Incidental CT findings: Degenerative changes of the visualized
thoracolumbar spine.
IMPRESSION: Right tonsillar mass extending to the bilateral tongue base,
measuring at least 4.9 cm in maximal axial dimension, better
evaluated on recent CT. This corresponds to the patient's known
primary head/neck cancer.

Associated right cervical nodal metastases.

No evidence of distant metastases.

## 2022-06-23 ENCOUNTER — Other Ambulatory Visit: Payer: Self-pay | Admitting: Family Medicine

## 2022-06-23 DIAGNOSIS — J0110 Acute frontal sinusitis, unspecified: Secondary | ICD-10-CM

## 2022-06-23 NOTE — Telephone Encounter (Signed)
Requested medication (s) are due for refill today:   Provider to review  Requested medication (s) are on the active medication list:   No  Future visit scheduled:   No   Last ordered: 09/14/2021 by Dr. Caryn Section for a sinus infection  Returned because no protocol assigned to this medication and it's not on his medication list.   Requested Prescriptions  Pending Prescriptions Disp Refills   amoxicillin (AMOXIL) 500 MG capsule [Pharmacy Med Name: AMOXICILLIN 500 MG CAPSULE] 40 capsule 0    Sig: TAKE 2 CAPSULES (1,000 MG TOTAL) BY MOUTH TWICE A DAY FOR 10 DAYS     Off-Protocol Failed - 06/23/2022 10:27 AM      Failed - Medication not assigned to a protocol, review manually.      Passed - Valid encounter within last 12 months    Recent Outpatient Visits           9 months ago Upper respiratory tract infection, unspecified type   [facility] Birdie Sons, MD   2 years ago Subacute sinusitis, unspecified location   [place], PA-C   2 years ago Foot pain, right   [facility] Birdie Sons, MD   5 years ago Generalized abdominal pain   [place], Vickki Muff, PA-C   5 years ago [place], [place]

## 2022-06-24 ENCOUNTER — Other Ambulatory Visit: Payer: Self-pay

## 2022-06-24 ENCOUNTER — Emergency Department: Payer: Medicare HMO

## 2022-06-24 ENCOUNTER — Encounter: Payer: Self-pay | Admitting: Hematology and Oncology

## 2022-06-24 ENCOUNTER — Emergency Department
Admission: EM | Admit: 2022-06-24 | Discharge: 2022-06-24 | Disposition: A | Discharge status: Home / Self Care | Payer: Medicare HMO | Attending: Emergency Medicine | Admitting: Emergency Medicine

## 2022-06-24 DIAGNOSIS — R059 Cough, unspecified: Secondary | ICD-10-CM | POA: Diagnosis present

## 2022-06-24 DIAGNOSIS — J40 Bronchitis, not specified as acute or chronic: Secondary | ICD-10-CM

## 2022-06-24 MED ORDER — AMOXICILLIN-POT CLAVULANATE 250-62.5 MG/5ML PO SUSR: 250.0000 mg | Freq: Two times a day (BID) | ORAL | 0 refills | Status: AC

## 2022-06-24 MED ORDER — PREDNISONE 10 MG (21) PO TBPK: ORAL_TABLET | ORAL | 0 refills | Status: DC

## 2022-06-24 NOTE — ED Provider Notes (Signed)
[facility] Provider Note  Patient Contact: 3:40 PM (approximate)   History   Nasal Congestion   HPI  Omar Christian is a 66 y.o. male presents to the emergency department with cough for the past 6 days.  Patient states that he has had some fatigue at home.  Is also had some nasal congestion and pharyngitis.  Patient states that he does have a history of esophageal cancer but is in remission and not receiving active treatments.  He denies chest breath.  No vomiting or abdominal pain.      Physical Exam   Triage Vital Signs: ED Triage Vitals  Enc Vitals Group     BP 06/24/22 1415 (!) 165/78     Pulse Rate 06/24/22 1415 (!) 105     Resp 06/24/22 1415 (!) 22     Temp 06/24/22 1415 98.4 F (36.9 C)     Temp Source 06/24/22 1415 Oral     SpO2 06/24/22 1415 97 %     Weight 06/24/22 1418 225 lb (102.1 kg)     Height 06/24/22 1418 6' (1.829 m)     Head Circumference --      Peak Flow --      Pain Score 06/24/22 1417 0     Pain Loc --      Pain Edu? --      Excl. in [place]? --     Most recent vital signs: Vitals:   06/24/22 1415  BP: (!) 165/78  Pulse: (!) 105  Resp: (!) 22  Temp: 98.4 F (36.9 C)  SpO2: 97%     Constitutional: Alert and oriented. Patient is lying supine. Eyes: Conjunctivae are normal. PERRL. EOMI. Head: Atraumatic. ENT:      Ears: Tympanic membranes are mildly injected with mild effusion bilaterally.       Nose: No congestion/rhinnorhea.      Mouth/Throat: Mucous membranes are moist. Posterior pharynx is mildly erythematous.  Hematological/Lymphatic/Immunilogical: No cervical lymphadenopathy.  Cardiovascular: Normal rate, regular rhythm. Normal S1 and S2.  Good peripheral circulation. Respiratory: Normal respiratory effort without tachypnea or retractions. Lungs CTAB. Good air entry to the bases with no decreased or absent breath sounds. Gastrointestinal: Bowel sounds 4 quadrants. Soft and nontender to palpation. No  guarding or rigidity. No palpable masses. No distention. No CVA tenderness. Musculoskeletal: Full range of motion to all extremities. No gross deformities appreciated. Neurologic:  Normal speech and language. No gross focal neurologic deficits are appreciated.  Skin:  Skin is warm, dry and intact. No rash noted. Psychiatric: Mood and affect are normal. Speech and behavior are normal. Patient exhibits appropriate insight and judgement.   ED Results / Procedures / Treatments   Labs (all labs ordered are listed, but only abnormal results are displayed) Labs Reviewed - No data to display    RADIOLOGY  I personally viewed and evaluated these images as part of my medical decision making, as well as reviewing the written report by the radiologist.  ED Provider Interpretation: No acute abnormality   PROCEDURES:  Critical Care performed: No  Procedures   MEDICATIONS ORDERED IN ED: Medications - No data to display   IMPRESSION / MDM / [place] / ED COURSE  I reviewed the triage vital signs and the nursing notes.                              Assessment and plan:  Cough:  66 year old male presents to the emergency department with cough for the past week without shortness of breath or chest pain.  Patient was mildly hypertensive at triage and mildly tachycardic.  He was maintaining O2 sats at 97% on room air.  Chest x-ray unremarkable.  Will start patient on Augmentin and tapered prednisone.  Return precautions were given to return with new or worsening symptoms.   FINAL CLINICAL IMPRESSION(S) / ED DIAGNOSES   Final diagnoses:  Bronchitis     Rx / DC Orders   ED Discharge Orders          Ordered    predniSONE (STERAPRED UNI-PAK 21 TAB) 10 MG (21) TBPK tablet        06/24/22 1638    amoxicillin-clavulanate (AUGMENTIN) 250-62.5 MG/5ML suspension  2 times daily        06/24/22 1638             Note:  This document was prepared using Dragon voice  recognition software and may include unintentional dictation errors.   Vallarie Mare [place], PA-C 06/24/22 1645    Lucillie Garfinkel, MD 06/24/22 2215

## 2022-06-24 NOTE — ED Provider Triage Note (Signed)
Emergency Medicine Provider Triage Evaluation Note  Omar Christian , a 66 y.o. male  was evaluated in triage.  Pt complains of sinus congestion for 4-5 days .  Review of Systems  Positive: congestion Negative: Fever negative.   Physical Exam  BP (!) 165/78 (BP Location: Left Arm)   Pulse (!) 105   Temp 98.4 F (36.9 C) (Oral)   Resp (!) 22   Ht 6' (1.829 m)   Wt 102.1 kg   SpO2 97%   BMI 30.52 kg/m  Gen:   Awake, no distress   Resp:  Normal effort  MSK:   Moves extremities without difficulty  Other:    Medical Decision Making  Medically screening exam initiated at 2:20 PM.  Appropriate orders placed.  Omar Christian was informed that the remainder of the evaluation will be completed by another provider, this initial triage assessment does not replace that evaluation, and the importance of remaining in the ED until their evaluation is complete.     Omar Hai, PA-C 06/24/22 1421

## 2022-06-24 NOTE — ED Triage Notes (Signed)
Pt to Ed for sinus congestion x 6 days. Pt called his PCP and they informed him to come to the ER for his sinus infection. Pt is CAOx4 and in no acute distress.

## 2022-06-24 NOTE — ED Triage Notes (Signed)
Pt advised he has taken OTC meds like mucinex at home with no relief.

## 2022-07-06 ENCOUNTER — Ambulatory Visit (INDEPENDENT_AMBULATORY_CARE_PROVIDER_SITE_OTHER): Payer: Medicare HMO | Admitting: Physician Assistant

## 2022-07-06 ENCOUNTER — Encounter: Payer: Self-pay | Admitting: Physician Assistant

## 2022-07-06 VITALS — BP 139/88 | HR 97 | Temp 98.2°F | Wt 230.5 lb

## 2022-07-06 DIAGNOSIS — J208 Acute bronchitis due to other specified organisms: Secondary | ICD-10-CM

## 2022-07-06 DIAGNOSIS — B9689 Other specified bacterial agents as the cause of diseases classified elsewhere: Secondary | ICD-10-CM | POA: Diagnosis not present

## 2022-07-06 MED ORDER — AMOXICILLIN 875 MG PO TABS: 875.0000 mg | ORAL_TABLET | Freq: Two times a day (BID) | ORAL | 0 refills | Status: DC

## 2022-07-06 NOTE — Progress Notes (Signed)
I,Sha'taria Tyson,acting as a Education administrator for [employer], PA-C.,have documented all relevant documentation on the behalf of [name], PA-C,as directed by  [name], PA-C while in the presence of [name], PA-C.    Established patient visit   Patient: Omar Christian   DOB: 05/07/1956   67 y.o. Male  MRN: [id number] Visit Date: 07/06/2022  Today's healthcare provider: [name], PA-C   Cc. Prolonged cough  Subjective     Pt was seen in the ED 12/23 dx with bronchitis, chest xray was normal, given a prednisone pack and augmentin. Pt was given 250 mg BID of augmentin x 10 days, in liquid form.  Pt reports his symptoms have not improved, still coughing with thick green-yellow sputum, fatigue. Denies SOB, wheezing, headache, fever. Pt is insistent he needs high dose regular amoxicillin to clear this.   Medications: Outpatient Medications Prior to Visit  Medication Sig   allopurinol (ZYLOPRIM) 100 MG tablet TAKE 1 TABLET BY MOUTH EVERY DAY   predniSONE (STERAPRED UNI-PAK 21 TAB) 10 MG (21) TBPK tablet Take 6 tablets the first day, take 5 tablets the second day, take 4 tablets the third day, take 3 tablets the fourth day, take 2 tablets the fifth day, take 1 tablet the sixth day. (Patient not taking: Reported on 07/06/2022)   No facility-administered medications prior to visit.    Review of Systems  Respiratory:  Positive for cough.      Objective    Blood pressure 139/88, pulse 97, temperature 98.2 F (36.8 C), temperature source Oral, weight 230 lb 8 oz (104.6 kg), SpO2 96 %.   Physical Exam Constitutional:      General: He is awake.     Appearance: He is well-developed.  HENT:     Head: Normocephalic.  Eyes:     Conjunctiva/sclera: Conjunctivae normal.  Cardiovascular:     Rate and Rhythm: Normal rate and regular rhythm.     Heart sounds: Normal heart sounds.  Pulmonary:     Effort: Pulmonary effort is normal.     Breath sounds: Normal breath  sounds. No wheezing or rales.  Skin:    General: Skin is warm.  Neurological:     Mental Status: He is alert and oriented to person, place, and time.  Psychiatric:        Attention and Perception: Attention normal.        Mood and Affect: Mood normal.        Speech: Speech normal.        Behavior: Behavior is cooperative.     No results found for any visits on 07/06/22.  Assessment & Plan     Bacterial bronchitis Reviewed ED notes, cxr normal lungs CTA. 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 If cough symptoms persist would also consider pulm referral.  Return if symptoms worsen or fail to improve.      I, [name], PA-C have reviewed all documentation for this visit. The documentation on  07/06/2022  for the exam, diagnosis, procedures, and orders are all accurate and complete.  [name], PA-C [facility] [address]  [place], [place], [address] Office: [phone] Fax: [place]

## 2022-07-18 ENCOUNTER — Ambulatory Visit: Payer: Self-pay

## 2022-07-18 ENCOUNTER — Ambulatory Visit: Payer: 59 | Admitting: Hematology and Oncology

## 2022-07-18 NOTE — Telephone Encounter (Signed)
  Chief Complaint: Wants additional antibiotic for bronchitis Symptoms: Cough, mucous Frequency: before 07/06/2021 Pertinent Negatives: Patient denies  Disposition: '[]'$ ED /'[]'$ Urgent Care (no appt availability in office) / '[]'$ Appointment(In office/virtual)/ '[]'$  [facility]/ '[]'$ Home Care/ '[]'$ Refused Recommended Disposition /'[]'$ [place] [facility]/ '[x]'$  Follow-up with PCP Additional Notes: Spoke with wife. She states that cough and congestion are not cleared up. She states that Dr. Jeananne Rama always gave her husband '1200mg'$  of Amoxicillin which always worked for him. Wife wonders if this prescription just wasn't strong enough.  Wife also mentioned that Ria Comment thought about giving pt a different medication for S/S.   PT ans wife would like additional antibiotics called in.  Please advise.    Summary: Cough and congestion   Patient's wife states that patient was in the office on 07/06/22 for Bronchitis and was given amoxicillin (AMOXIL) 875 MG tablet. Patient has completed antibiotics and still has cough and chest congestion.     Reason for Disposition  [1] Prescription refill request for NON-ESSENTIAL medicine (i.e., no harm to patient if med not taken) AND [2] triager unable to refill per department policy  Answer Assessment - Initial Assessment Questions 1. DRUG NAME: "What medicine do you need to have refilled?"     Amoxicillin  2. REFILLS REMAINING: "How many refills are remaining?" (Note: The label on the medicine or pill bottle will show how many refills are remaining. If there are no refills remaining, then a renewal may be needed.)     none 3. EXPIRATION DATE: "What is the expiration date?" (Note: The label states when the prescription will expire, and thus can no longer be refilled.)      4. PRESCRIBING HCP: "Who prescribed it?" Reason: If prescribed by specialist, call should be referred to that group.     [facility] Comment [place] 5. SYMPTOMS: "Do you have any symptoms?"     Cough -  bronchitis 6. PREGNANCY: "Is there any chance that you are pregnant?" "When was your last menstrual period?"  Protocols used: Medication Refill and Renewal Call-A-AH

## 2022-07-19 ENCOUNTER — Other Ambulatory Visit: Payer: Self-pay | Admitting: Physician Assistant

## 2022-07-19 DIAGNOSIS — R053 Chronic cough: Secondary | ICD-10-CM

## 2022-07-19 DIAGNOSIS — B9689 Other specified bacterial agents as the cause of diseases classified elsewhere: Secondary | ICD-10-CM

## 2022-07-19 NOTE — Telephone Encounter (Signed)
Spoke to pt/wife--stated still congested/coughing w/ yellow/green mucus. Pt agreed to have chest xray done.

## 2022-07-19 NOTE — Telephone Encounter (Signed)
Notified pt/wife chest xray been ordered at [facility] imaging.

## 2022-07-20 ENCOUNTER — Ambulatory Visit
Admission: RE | Admit: 2022-07-20 | Discharge: 2022-07-20 | Disposition: A | Discharge status: Home / Self Care | Payer: Medicare HMO | Attending: Physician Assistant | Admitting: Physician Assistant

## 2022-07-20 ENCOUNTER — Ambulatory Visit
Admission: RE | Admit: 2022-07-20 | Discharge: 2022-07-20 | Disposition: A | Discharge status: Home / Self Care | Payer: Medicare HMO | Source: Ambulatory Visit | Attending: Physician Assistant | Admitting: Physician Assistant

## 2022-07-20 DIAGNOSIS — B9689 Other specified bacterial agents as the cause of diseases classified elsewhere: Secondary | ICD-10-CM

## 2022-07-20 DIAGNOSIS — J208 Acute bronchitis due to other specified organisms: Secondary | ICD-10-CM | POA: Diagnosis not present

## 2022-07-20 DIAGNOSIS — R053 Chronic cough: Secondary | ICD-10-CM | POA: Insufficient documentation

## 2022-07-21 ENCOUNTER — Other Ambulatory Visit: Payer: Self-pay | Admitting: Physician Assistant

## 2022-07-21 DIAGNOSIS — R918 Other nonspecific abnormal finding of lung field: Secondary | ICD-10-CM

## 2022-07-27 ENCOUNTER — Encounter: Payer: Self-pay | Admitting: Physician Assistant

## 2022-07-27 ENCOUNTER — Telehealth: Payer: Self-pay | Admitting: Physician Assistant

## 2022-07-27 NOTE — Telephone Encounter (Signed)
Omar Christian is calling results were shared with the patient. However, the Omar Christian does not hear well. Omar Christian is caling to request a CB of results  [facility] (Accession [id number]) (Order [id number]) B [phone]

## 2022-07-28 NOTE — Telephone Encounter (Signed)
Called pt and wife; they are ready to schedule ct will coordinate

## 2022-07-31 NOTE — Telephone Encounter (Signed)
This has already been addressed

## 2022-08-10 ENCOUNTER — Ambulatory Visit
Admission: RE | Admit: 2022-08-10 | Discharge: 2022-08-10 | Disposition: A | Discharge status: Home / Self Care | Payer: Medicare HMO | Source: Ambulatory Visit | Attending: Physician Assistant | Admitting: Physician Assistant

## 2022-08-10 DIAGNOSIS — R918 Other nonspecific abnormal finding of lung field: Secondary | ICD-10-CM | POA: Diagnosis present

## 2022-08-14 ENCOUNTER — Other Ambulatory Visit: Payer: Self-pay | Admitting: Physician Assistant

## 2022-08-14 DIAGNOSIS — D649 Anemia, unspecified: Secondary | ICD-10-CM

## 2022-08-14 DIAGNOSIS — I2510 Atherosclerotic heart disease of native coronary artery without angina pectoris: Secondary | ICD-10-CM

## 2022-08-14 DIAGNOSIS — K8050 Calculus of bile duct without cholangitis or cholecystitis without obstruction: Secondary | ICD-10-CM

## 2022-08-14 DIAGNOSIS — Z951 Presence of aortocoronary bypass graft: Secondary | ICD-10-CM

## 2022-08-14 DIAGNOSIS — K760 Fatty (change of) liver, not elsewhere classified: Secondary | ICD-10-CM

## 2022-08-14 DIAGNOSIS — C099 Malignant neoplasm of tonsil, unspecified: Secondary | ICD-10-CM

## 2022-08-14 DIAGNOSIS — R9389 Abnormal findings on diagnostic imaging of other specified body structures: Secondary | ICD-10-CM

## 2022-08-14 DIAGNOSIS — I10 Essential (primary) hypertension: Secondary | ICD-10-CM

## 2022-08-14 DIAGNOSIS — R918 Other nonspecific abnormal finding of lung field: Secondary | ICD-10-CM

## 2022-08-14 DIAGNOSIS — K8020 Calculus of gallbladder without cholecystitis without obstruction: Secondary | ICD-10-CM | POA: Insufficient documentation

## 2022-08-16 LAB — CBC WITH DIFFERENTIAL/PLATELET
Basophils Absolute: 0 10*3/uL (ref 0.0–0.2)
Basos: 1 %
EOS (ABSOLUTE): 0.1 10*3/uL (ref 0.0–0.4)
Eos: 1 %
Hematocrit: 44.4 % (ref 37.5–51.0)
Hemoglobin: 15.1 g/dL (ref 13.0–17.7)
Immature Grans (Abs): 0 10*3/uL (ref 0.0–0.1)
Immature Granulocytes: 0 %
Lymphocytes Absolute: 0.9 10*3/uL (ref 0.7–3.1)
Lymphs: 18 %
MCH: 30.7 pg (ref 26.6–33.0)
MCHC: 34 g/dL (ref 31.5–35.7)
MCV: 90 fL (ref 79–97)
Monocytes Absolute: 0.3 10*3/uL (ref 0.1–0.9)
Monocytes: 7 %
Neutrophils Absolute: 3.5 10*3/uL (ref 1.4–7.0)
Neutrophils: 73 %
Platelets: 156 10*3/uL (ref 150–450)
RBC: 4.92 x10E6/uL (ref 4.14–5.80)
RDW: 14.1 % (ref 11.6–15.4)
WBC: 4.8 10*3/uL (ref 3.4–10.8)

## 2022-08-16 LAB — COMPREHENSIVE METABOLIC PANEL
ALT: 28 IU/L (ref 0–44)
AST: 22 IU/L (ref 0–40)
Albumin/Globulin Ratio: 2.1 (ref 1.2–2.2)
Albumin: 4.7 g/dL (ref 3.9–4.9)
Alkaline Phosphatase: 76 IU/L (ref 44–121)
BUN/Creatinine Ratio: 13 (ref 10–24)
BUN: 16 mg/dL (ref 8–27)
Bilirubin Total: 0.7 mg/dL (ref 0.0–1.2)
CO2: 23 mmol/L (ref 20–29)
Calcium: 10.7 mg/dL — ABNORMAL HIGH (ref 8.6–10.2)
Chloride: 100 mmol/L (ref 96–106)
Creatinine, Ser: 1.22 mg/dL (ref 0.76–1.27)
Globulin, Total: 2.2 g/dL (ref 1.5–4.5)
Glucose: 127 mg/dL — ABNORMAL HIGH (ref 70–99)
Potassium: 4.7 mmol/L (ref 3.5–5.2)
Sodium: 140 mmol/L (ref 134–144)
Total Protein: 6.9 g/dL (ref 6.0–8.5)
eGFR: 66 mL/min/{1.73_m2} (ref >59)

## 2022-08-16 LAB — PSA: Prostate Specific Ag, Serum: 4.3 ng/mL — ABNORMAL HIGH (ref 0.0–4.0)

## 2022-08-16 LAB — LIPID PANEL
Chol/HDL Ratio: 5.1 ratio — ABNORMAL HIGH (ref 0.0–5.0)
Cholesterol, Total: 235 mg/dL — ABNORMAL HIGH (ref 100–199)
HDL: 46 mg/dL (ref >39)
LDL Chol Calc (NIH): 136 mg/dL — ABNORMAL HIGH (ref 0–99)
Triglycerides: 297 mg/dL — ABNORMAL HIGH (ref 0–149)
VLDL Cholesterol Cal: 53 mg/dL — ABNORMAL HIGH (ref 5–40)

## 2022-08-21 ENCOUNTER — Other Ambulatory Visit: Payer: Self-pay | Admitting: Physician Assistant

## 2022-08-21 ENCOUNTER — Encounter: Payer: Self-pay | Admitting: Physician Assistant

## 2022-08-21 DIAGNOSIS — C099 Malignant neoplasm of tonsil, unspecified: Secondary | ICD-10-CM

## 2022-08-21 DIAGNOSIS — R918 Other nonspecific abnormal finding of lung field: Secondary | ICD-10-CM

## 2022-08-21 DIAGNOSIS — R9389 Abnormal findings on diagnostic imaging of other specified body structures: Secondary | ICD-10-CM

## 2022-08-23 ENCOUNTER — Telehealth: Payer: Self-pay | Admitting: *Deleted

## 2022-08-23 NOTE — Telephone Encounter (Signed)
This patient is known to our cancer center. He came for several weeks for supportive IVF's from [facility]. He has been seen by our APP as well. Will be seen as a new referral for new Lung nodules. Has tongue cancer. Appt tomorrow.

## 2022-08-24 ENCOUNTER — Encounter: Payer: Self-pay | Admitting: Internal Medicine

## 2022-08-24 ENCOUNTER — Inpatient Hospital Stay: Payer: Medicare HMO | Attending: Internal Medicine | Admitting: Internal Medicine

## 2022-08-24 ENCOUNTER — Inpatient Hospital Stay: Payer: Medicare HMO

## 2022-08-24 DIAGNOSIS — Z800 Family history of malignant neoplasm of digestive organs: Secondary | ICD-10-CM | POA: Diagnosis not present

## 2022-08-24 DIAGNOSIS — R918 Other nonspecific abnormal finding of lung field: Secondary | ICD-10-CM | POA: Insufficient documentation

## 2022-08-24 DIAGNOSIS — C099 Malignant neoplasm of tonsil, unspecified: Secondary | ICD-10-CM

## 2022-08-24 DIAGNOSIS — C098 Malignant neoplasm of overlapping sites of tonsil: Secondary | ICD-10-CM | POA: Insufficient documentation

## 2022-08-24 DIAGNOSIS — I10 Essential (primary) hypertension: Secondary | ICD-10-CM | POA: Diagnosis not present

## 2022-08-24 DIAGNOSIS — I2510 Atherosclerotic heart disease of native coronary artery without angina pectoris: Secondary | ICD-10-CM | POA: Insufficient documentation

## 2022-08-24 DIAGNOSIS — H9190 Unspecified hearing loss, unspecified ear: Secondary | ICD-10-CM | POA: Diagnosis not present

## 2022-08-24 LAB — COMPREHENSIVE METABOLIC PANEL
ALT: 30 U/L (ref 0–44)
AST: 28 U/L (ref 15–41)
Albumin: 4.2 g/dL (ref 3.5–5.0)
Alkaline Phosphatase: 64 U/L (ref 38–126)
Anion gap: 8 (ref 5–15)
BUN: 13 mg/dL (ref 8–23)
CO2: 24 mmol/L (ref 22–32)
Calcium: 9.3 mg/dL (ref 8.9–10.3)
Chloride: 104 mmol/L (ref 98–111)
Creatinine, Ser: 1.14 mg/dL (ref 0.61–1.24)
GFR, Estimated: >60 mL/min (ref >60)
Glucose, Bld: 116 mg/dL — ABNORMAL HIGH (ref 70–99)
Potassium: 4 mmol/L (ref 3.5–5.1)
Sodium: 136 mmol/L (ref 135–145)
Total Bilirubin: 0.8 mg/dL (ref 0.3–1.2)
Total Protein: 7.1 g/dL (ref 6.5–8.1)

## 2022-08-24 LAB — VITAMIN D 25 HYDROXY (VIT D DEFICIENCY, FRACTURES): Vit D, 25-Hydroxy: 28.51 ng/mL — ABNORMAL LOW (ref 30–100)

## 2022-08-24 LAB — CBC WITH DIFFERENTIAL/PLATELET
Abs Immature Granulocytes: 0.01 10*3/uL (ref 0.00–0.07)
Basophils Absolute: 0 10*3/uL (ref 0.0–0.1)
Basophils Relative: 1 %
Eosinophils Absolute: 0.1 10*3/uL (ref 0.0–0.5)
Eosinophils Relative: 2 %
HCT: 42.8 % (ref 39.0–52.0)
Hemoglobin: 14.6 g/dL (ref 13.0–17.0)
Immature Granulocytes: 0 %
Lymphocytes Relative: 17 %
Lymphs Abs: 0.7 10*3/uL (ref 0.7–4.0)
MCH: 30.7 pg (ref 26.0–34.0)
MCHC: 34.1 g/dL (ref 30.0–36.0)
MCV: 90.1 fL (ref 80.0–100.0)
Monocytes Absolute: 0.3 10*3/uL (ref 0.1–1.0)
Monocytes Relative: 7 %
Neutro Abs: 3.2 10*3/uL (ref 1.7–7.7)
Neutrophils Relative %: 73 %
Platelets: 144 10*3/uL — ABNORMAL LOW (ref 150–400)
RBC: 4.75 MIL/uL (ref 4.22–5.81)
RDW: 13.8 % (ref 11.5–15.5)
WBC: 4.3 10*3/uL (ref 4.0–10.5)
nRBC: 0 % (ref 0.0–0.2)

## 2022-08-24 LAB — LACTATE DEHYDROGENASE: LDH: 122 U/L (ref 98–192)

## 2022-08-24 NOTE — Progress Notes (Signed)
[place] NOTE  Patient Care Team: [name], PA-C as PCP - General (Physician Assistant) [name], MD (Inactive) as Consulting Physician (Cardiothoracic Surgery) [name], MD as Consulting Physician (Cardiology) [name], MD as Consulting Physician (Hematology and Oncology) [name], MD as Attending Physician (Radiation Oncology) [name], [name], RN as Oncology Nurse Navigator [name], [name], [place] as Speech Language Pathologist (Speech Pathology) [name], DDS (Inactive) as Consulting Physician (Dentistry) [name], [name], PT as Physical Therapist (Physical Therapy) [name], LCSW (Inactive) as Social Worker (General Practice) Elmore, Abigail P, LCSW (Inactive) as Social Worker [name], RD as Dietitian (Nutrition)  CHIEF COMPLAINTS/PURPOSE OF CONSULTATION: tonsil cancer  Oncology History Overview Note  # SEP 2021- HPV positive- stage III/IV- cisplatin-RT [Dr.Gudena/[facility]]-  # FEB 2024- Multiple pulmonary nodules are noted in the lungs bilaterally, new compared to the prior study, largest of which is in the posteromedial aspect of the superior segment of the left lower lobe (axial image 70 of series 4) measuring 1.7 x 1.3 cm. Other prominent nodules include a 10 x 9 mm nodule in the posteromedial right lower lobe (axial image 70 of series 4), and a 8 mm nodule in the left upper lobe (axial image 44 of series 4).     Tonsillar cancer ([place])  01/14/2020 Initial Diagnosis   Squamous cell carcinoma with basaloid features   01/26/2020 PET scan   Right tonsillar mass extending to bilateral tongue base 4.9 cm.  Two right cervical lymph nodes metastases measuring 2.5 cm and 2.1 cm   02/17/2020 - 03/23/2020 Chemotherapy   Patient is on Treatment Plan : HEAD/NECK Cisplatin + XRT q21d     02/17/2020 Cancer Staging   Staging form: Pharynx - P16 Negative Oropharynx, AJCC 8th Edition -  Clinical stage from 02/17/2020: Stage IVA (cT4a, cN2b, cM0, p16+) - Signed by [name], MD on 03/02/2020   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[place])  01/27/2020 Cancer Staging   Staging form: Pharynx - HPV-Mediated Oropharynx, AJCC 8th Edition - Clinical stage from 01/27/2020: Stage III (cT4, cN1, cM0, p16+) - Signed by [name], MD on 01/31/2020   01/31/2020 Initial Diagnosis   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[place])     HISTORY OF PRESENTING ILLNESS: Patient is hard of hearing.  Accompanied by his wife.   Omar Christian 67 y.o.  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  Patient received cisplatin radiation concurrent chemo radiation finished treatment in September 2021 [facility].   Patient had recent chest x-ray that was abnormal which led to further imaging with CAT scan that showed bilateral lung nodules.  Patient is here to discuss options.  Patient denies any wound pain.  Denies any nausea vomiting abdominal pain.   Review of Systems  Constitutional:  Negative for chills, diaphoresis, fever, malaise/fatigue and weight loss.  HENT:  Negative for nosebleeds and sore throat.   Eyes:  Negative for double vision.  Respiratory:  Negative for cough, hemoptysis, sputum production, shortness of breath and wheezing.   Cardiovascular:  Negative for chest pain, palpitations, orthopnea and leg swelling.  Gastrointestinal:  Negative for abdominal pain, blood in stool, constipation, diarrhea, heartburn, melena, nausea and vomiting.  Genitourinary:  Negative for dysuria, frequency and urgency.  Musculoskeletal:  Negative for back pain and joint pain.  Skin: Negative.  Negative for itching and rash.  Neurological:  Negative for dizziness, tingling, focal weakness, weakness and headaches.  Endo/Heme/Allergies:  Does not bruise/bleed easily.  Psychiatric/Behavioral:  Negative for depression. The  patient is not nervous/anxious and does not have insomnia.     MEDICAL HISTORY:  Past Medical History:  Diagnosis Date   Abnormal findings on cardiac catheterization 09/30/2015   [facility]   Abnormal myocardial perfusion study 09/24/2015   [place]   CAD, multiple vessel 09/30/2015   per CATH @ [facility]/DR.PARACHOS   Cancer ([place])    Coronary artery disease    GERD (gastroesophageal reflux disease)    Gout    H/O echocardiogram 09/24/2015   [facility]   Hard of hearing    History of pneumonia    Hyperlipidemia    Hypertension    Pneumonia    Progressive angina ([place])    Tonsil cancer ([place])    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    SURGICAL HISTORY: Past Surgical History:  Procedure Laterality Date   CARDIAC CATHETERIZATION N/A 09/30/2015   Procedure: Left Heart Cath and Coronary Angiography;  Surgeon: [name], MD;  Location: [facility];  Service: Cardiovascular;  Laterality: N/A;   CORONARY ARTERY BYPASS GRAFT N/A 10/08/2015   Procedure: CORONARY ARTERY BYPASS GRAFTING time 4 using left internal mammary and right greater saphenous vein harvested by endovein;  Surgeon: [name], MD;  Location: [facility];  Service: Open Heart Surgery;  Laterality: N/A;   CORONARY ARTERY BYPASS GRAFT     IR GASTROSTOMY TUBE MOD SED  02/10/2020   IR GASTROSTOMY TUBE REMOVAL  10/12/2020   IR IMAGING GUIDED PORT INSERTION  02/10/2020   IR REMOVAL TUN ACCESS W/ PORT W/O FL MOD SED  10/12/2020   MULTIPLE EXTRACTIONS WITH ALVEOLOPLASTY N/A 02/04/2020   Procedure: Extraction of tooth #'s [phone], and 32 with alveoloplasty and gross debridement of remaining teeth.;  Surgeon: [name], DDS;  Location: [facility];  Service: Oral Surgery;  Laterality: N/A;   TEE WITHOUT CARDIOVERSION N/A 10/08/2015   Procedure: TRANSESOPHAGEAL ECHOCARDIOGRAM (TEE);  Surgeon: [name], MD;  Location: [facility];  Service: Open Heart Surgery;  Laterality: N/A;   VASECTOMY     WISDOM  TOOTH EXTRACTION      SOCIAL HISTORY: Social History   Socioeconomic History   Marital status: Married    Spouse name: Not on file   Number of children: 2   Years of education: Not on file   Highest education level: Not on file  Occupational History   Not on file  Tobacco Use   Smoking status: Never   Smokeless tobacco: Never  Vaping Use   Vaping Use: Never used  Substance and Sexual Activity   Alcohol use: No   Drug use: No   Sexual activity: Not Currently    Birth control/protection: Surgical    Comment: Vasectomy  Other Topics Concern   Not on file  Social History Narrative   Not on file   Social Determinants of Health   Financial Resource Strain: Low Risk  (01/26/2020)   Overall Financial Resource Strain (CARDIA)    Difficulty of Paying Living Expenses: Not very hard  Food Insecurity: Not on file  Transportation Needs: No Transportation Needs (01/26/2020)   PRAPARE - Transportation    Lack of Transportation (Medical): No    Lack of Transportation (Non-Medical): No  Physical Activity: Not on file  Stress: Not on file  Social Connections: Not on file  Intimate Partner Violence: Not on file  FAMILY HISTORY: Family History  Problem Relation Age of Onset   Heart disease Mother    Hypertension Mother    Alcohol abuse Father    Throat cancer Father    Muscular dystrophy Brother    Muscular dystrophy Brother     ALLERGIES:  has No Known Allergies.  MEDICATIONS:  No current outpatient medications on file.   No current facility-administered medications for this visit.    PHYSICAL EXAMINATION:   There were no vitals filed for this visit. There were no vitals filed for this visit.  Physical Exam Vitals and nursing note reviewed.  HENT:     Head: Normocephalic and atraumatic.     Mouth/Throat:     Pharynx: Oropharynx is clear.  Eyes:     Extraocular Movements: Extraocular movements intact.     Pupils: Pupils are equal, round, and reactive to  light.  Cardiovascular:     Rate and Rhythm: Normal rate and regular rhythm.  Pulmonary:     Comments: Decreased breath sounds bilaterally.  Abdominal:     Palpations: Abdomen is soft.  Musculoskeletal:        General: Normal range of motion.     Cervical back: Normal range of motion.  Skin:    General: Skin is warm.  Neurological:     General: No focal deficit present.     Mental Status: He is alert and oriented to person, place, and time.  Psychiatric:        Behavior: Behavior normal.        Judgment: Judgment normal.     LABORATORY DATA:  I have reviewed the data as listed Lab Results  Component Value Date   WBC 4.3 08/24/2022   HGB 14.6 08/24/2022   HCT 42.8 08/24/2022   MCV 90.1 08/24/2022   PLT 144 (L) 08/24/2022   Recent Labs    08/15/22 1012 08/24/22 1149  NA 140 136  K 4.7 4.0  CL 100 104  CO2 23 24  GLUCOSE 127* 116*  BUN 16 13  CREATININE 1.22 1.14  CALCIUM 10.7* 9.3  GFRNONAA  --  >60  PROT 6.9 7.1  ALBUMIN 4.7 4.2  AST 22 28  ALT 28 30  ALKPHOS 76 64  BILITOT 0.7 0.8    RADIOGRAPHIC STUDIES: I have personally reviewed the radiological images as listed and agreed with the findings in the report. CT Chest Wo Contrast  Result Date: 08/12/2022 CLINICAL DATA:  67 year old male with history of abnormal chest x-ray and chronic cough. Prior history of tonsillar cancer. * Tracking Code: BO * EXAM: CT CHEST WITHOUT CONTRAST TECHNIQUE: Multidetector CT imaging of the chest was performed following the standard protocol without IV contrast. RADIATION DOSE REDUCTION: This exam was performed according to the departmental dose-optimization program which includes automated exposure control, adjustment of the mA and/or kV according to patient size and/or use of iterative reconstruction technique. COMPARISON:  Chest CT 03/03/2020. FINDINGS: Cardiovascular: Heart size is mildly enlarged. There is no significant pericardial fluid, thickening or pericardial  calcification. There is aortic atherosclerosis, as well as atherosclerosis of the great vessels of the mediastinum and the coronary arteries, including calcified atherosclerotic plaque in the left main, left anterior descending, left circumflex and right coronary arteries. Status post median sternotomy for CABG including LIMA to the LAD. Mediastinum/Nodes: No pathologically enlarged mediastinal or hilar lymph nodes. Please note that accurate exclusion of hilar adenopathy is limited on noncontrast CT scans. Esophagus is unremarkable in appearance. No axillary lymphadenopathy. Lungs/Pleura: Multiple pulmonary  nodules are noted in the lungs bilaterally, new compared to the prior study, largest of which is in the posteromedial aspect of the superior segment of the left lower lobe (axial image 70 of series 4) measuring 1.7 x 1.3 cm. Other prominent nodules include a 10 x 9 mm nodule in the posteromedial right lower lobe (axial image 70 of series 4), and a 8 mm nodule in the left upper lobe (axial image 44 of series 4). No acute consolidative airspace disease. No pleural effusions. Scattered areas of scarring noted throughout the lower lungs bilaterally, presumably areas of chronic post infectious or inflammatory scarring. Upper Abdomen: Aortic atherosclerosis. Small calcified gallstones lying dependently in the gallbladder. Diffuse low attenuation throughout the visualized hepatic parenchyma, indicative of hepatic steatosis. 1 cm low-attenuation lesion in segment 7 of the liver, incompletely characterized on today's noncontrast CT examination, but statistically likely to represent a cyst (no imaging follow-up recommended). Musculoskeletal: Median sternotomy wires. There are no aggressive appearing lytic or blastic lesions noted in the visualized portions of the skeleton. IMPRESSION: 1. Multiple pulmonary nodules in the lungs, new compared to the prior study, concerning for metastatic disease. Further evaluation with  PET-CT should be considered in the near future. 2. Aortic atherosclerosis, in addition to left main and three-vessel coronary artery disease. Status post median sternotomy for CABG including LIMA to the LAD. 3. Mild cardiomegaly. 4. Cholelithiasis. 5. Hepatic steatosis. Aortic Atherosclerosis (ICD10-I70.0). Electronically Signed   By: Vinnie Langton M.D.   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  Currently on surveillance; however concern for recurrence-see below  # FEB 10th, 2024-  Multiple pulmonary nodules in the lungs, new compared to the prior study, concerning for metastatic disease.  I would recommend a PET scan for further evaluation.  Discussed the patient-then recommend a biopsy biopsy based on the results of the PET scan.  We also review at the tumor conference.    # Mild hypercalcemia PCP 10.7-recommend workup. vit 25-OH levels; PTH levels.  Multiple myeloma panel kappa, lambda light chain ratio.   # Hearing loss s/p Cisplatin-monitor for now.  # Hx of CAD- stable.   #Incidental findings on Imaging  CT FEB , 2024: Atherosclerosis; hepat I reviewed/discussed/counseled the patient.    I spoke at length with the patient's daughter [name] regarding the patient's clinical status/plan of care.  Family agreement.    Thank you Ms.Drubel PA C- for allowing me to participate in the care of your pleasant patient. Please do not hesitate to contact me with questions or concerns in the interim.  # DISPOSITION: # PET ASAP # labs- today- cbc/cmp-TSH; vit 25-OH levels; PTH levels.  # follow up with MD: 2-3 days after the PET scan- Dr.B  # I reviewed the blood work- with the patient in detail; also reviewed the imaging independently [as summarized above]; and with the patient in detail.    Above plan of care was discussed with patient/family in detail.  My contact  information was given to the patient/family.     Cammie Sickle, MD 08/24/2022 12:26 PM

## 2022-08-24 NOTE — Progress Notes (Signed)
Pt was Treated in [place] for Tongue Cancer. He came to our Cancer center for weeks of supportive IVF's. He has had bronchitis/URI  since Dec 20th. He has had 2 CXR's and a CT scan where some lung nodules have popped up. Here for Evaluation . Pt lives in [place] and wants to stay in [place] now for his care. Accompanied by wife today. Pt is eating well. No weight loss. Symptoms of chronic cough/lung congestion. Denies any CP or dyspnea.

## 2022-08-24 NOTE — Assessment & Plan Note (Addendum)
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  Currently on surveillance; however concern for recurrence-see below  # FEB 10th, 2024-  Multiple pulmonary nodules in the lungs, new compared to the prior study, concerning for metastatic disease.  I would recommend a PET scan for further evaluation.  Discussed the patient-then recommend a biopsy biopsy based on the results of the PET scan.  We also review at the tumor conference.    # Mild hypercalcemia PCP 10.7-recommend workup. vit 25-OH levels; PTH levels.  Multiple myeloma panel kappa, lambda light chain ratio.   # Hearing loss s/p Cisplatin-monitor for now.  # Hx of CAD- stable.   #Incidental findings on Imaging  CT FEB , 2024: Atherosclerosis; hepat I reviewed/discussed/counseled the patient.    I spoke at length with the patient's daughter [name] regarding the patient's clinical status/plan of care.  Family agreement.    Thank you Ms.Drubel PA C- for allowing me to participate in the care of your pleasant patient. Please do not hesitate to contact me with questions or concerns in the interim.  # DISPOSITION: # PET ASAP # labs- today- cbc/cmp-TSH; vit 25-OH levels; PTH levels.  # follow up with MD: 2-3 days after the PET scan- Dr.B  # I reviewed the blood work- with the patient in detail; also reviewed the imaging independently [as summarized above]; and with the patient in detail.

## 2022-08-25 LAB — PARATHYROID HORMONE, INTACT (NO CA): PTH: 48 pg/mL (ref 15–65)

## 2022-08-25 LAB — KAPPA/LAMBDA LIGHT CHAINS
Kappa free light chain: 17.7 mg/L (ref 3.3–19.4)
Kappa, lambda light chain ratio: 1.88 — ABNORMAL HIGH (ref 0.26–1.65)
Lambda free light chains: 9.4 mg/L (ref 5.7–26.3)

## 2022-08-29 LAB — MULTIPLE MYELOMA PANEL, SERUM
Albumin SerPl Elph-Mcnc: 3.9 g/dL (ref 2.9–4.4)
Albumin/Glob SerPl: 1.6 (ref 0.7–1.7)
Alpha 1: 0.2 g/dL (ref 0.0–0.4)
Alpha2 Glob SerPl Elph-Mcnc: 0.6 g/dL (ref 0.4–1.0)
B-Globulin SerPl Elph-Mcnc: 1 g/dL (ref 0.7–1.3)
Gamma Glob SerPl Elph-Mcnc: 0.7 g/dL (ref 0.4–1.8)
Globulin, Total: 2.5 g/dL (ref 2.2–3.9)
IgA: 97 mg/dL (ref 61–437)
IgG (Immunoglobin G), Serum: 760 mg/dL (ref 603–1613)
IgM (Immunoglobulin M), Srm: 59 mg/dL (ref 20–172)
Total Protein ELP: 6.4 g/dL (ref 6.0–8.5)

## 2022-09-01 ENCOUNTER — Ambulatory Visit
Admission: RE | Admit: 2022-09-01 | Discharge: 2022-09-01 | Disposition: A | Discharge status: Home / Self Care | Payer: Medicare HMO | Source: Ambulatory Visit | Attending: Internal Medicine | Admitting: Internal Medicine

## 2022-09-01 DIAGNOSIS — K760 Fatty (change of) liver, not elsewhere classified: Secondary | ICD-10-CM | POA: Diagnosis not present

## 2022-09-01 DIAGNOSIS — R918 Other nonspecific abnormal finding of lung field: Secondary | ICD-10-CM | POA: Diagnosis not present

## 2022-09-01 DIAGNOSIS — I700 Atherosclerosis of aorta: Secondary | ICD-10-CM | POA: Diagnosis not present

## 2022-09-01 DIAGNOSIS — C099 Malignant neoplasm of tonsil, unspecified: Secondary | ICD-10-CM

## 2022-09-01 DIAGNOSIS — K8020 Calculus of gallbladder without cholecystitis without obstruction: Secondary | ICD-10-CM | POA: Diagnosis not present

## 2022-09-01 LAB — GLUCOSE, CAPILLARY: Glucose-Capillary: 133 mg/dL — ABNORMAL HIGH (ref 70–99)

## 2022-09-01 MED ORDER — FLUDEOXYGLUCOSE F - 18 (FDG) INJECTION
11.9000 | Freq: Once | INTRAVENOUS | Status: AC | PRN
  Administered 2022-09-01: 12.16 via INTRAVENOUS

## 2022-09-11 ENCOUNTER — Inpatient Hospital Stay: Payer: Medicare HMO | Attending: Internal Medicine | Admitting: Internal Medicine

## 2022-09-11 ENCOUNTER — Encounter: Payer: Self-pay | Admitting: Internal Medicine

## 2022-09-11 VITALS — BP 160/77 | HR 68 | Temp 96.6°F | Wt 228.8 lb

## 2022-09-11 DIAGNOSIS — C098 Malignant neoplasm of overlapping sites of tonsil: Secondary | ICD-10-CM | POA: Insufficient documentation

## 2022-09-11 DIAGNOSIS — K8020 Calculus of gallbladder without cholecystitis without obstruction: Secondary | ICD-10-CM | POA: Diagnosis not present

## 2022-09-11 DIAGNOSIS — H9190 Unspecified hearing loss, unspecified ear: Secondary | ICD-10-CM | POA: Insufficient documentation

## 2022-09-11 DIAGNOSIS — I700 Atherosclerosis of aorta: Secondary | ICD-10-CM | POA: Insufficient documentation

## 2022-09-11 DIAGNOSIS — C099 Malignant neoplasm of tonsil, unspecified: Secondary | ICD-10-CM

## 2022-09-11 DIAGNOSIS — I10 Essential (primary) hypertension: Secondary | ICD-10-CM | POA: Diagnosis not present

## 2022-09-11 DIAGNOSIS — K760 Fatty (change of) liver, not elsewhere classified: Secondary | ICD-10-CM | POA: Insufficient documentation

## 2022-09-11 NOTE — Progress Notes (Signed)
[facility] NOTE  Patient Care Team: [name], PA-C as PCP - General (Physician Assistant) [name], MD (Inactive) as Consulting Physician (Cardiothoracic Surgery) [name], MD as Consulting Physician (Cardiology) [name], MD as Consulting Physician (Hematology and Oncology) [name], MD as Attending Physician (Radiation Oncology) [name], [name], RN as Oncology Nurse Navigator [name], [name], [place] as Speech Language Pathologist (Speech Pathology) [name], DDS (Inactive) as Consulting Physician (Dentistry) [name], [name], PT as Physical Therapist (Physical Therapy) [name], LCSW (Inactive) as Social Worker (General Practice) Elmore, Abigail P, LCSW (Inactive) as Social Worker [name], RD as Dietitian (Nutrition)  CHIEF COMPLAINTS/PURPOSE OF CONSULTATION: tonsil cancer  Oncology History Overview Note  # SEP 2021- HPV positive- stage III/IV- cisplatin-RT [Dr.Gudena/[facility]]-  # FEB 2024- Multiple pulmonary nodules are noted in the lungs bilaterally, new compared to the prior study, largest of which is in the posteromedial aspect of the superior segment of the left lower lobe (axial image 70 of series 4) measuring 1.7 x 1.3 cm. Other prominent nodules include a 10 x 9 mm nodule in the posteromedial right lower lobe (axial image 70 of series 4), and a 8 mm nodule in the left upper lobe (axial image 44 of series 4).     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  Two right cervical lymph nodes metastases measuring 2.5 cm and 2.1 cm   02/17/2020 - 03/23/2020 Chemotherapy   Patient is on Treatment Plan : HEAD/NECK Cisplatin + XRT q21d     02/17/2020 Cancer Staging   Staging form: Pharynx - P16 Negative Oropharynx, AJCC 8th Edition -  Clinical stage from 02/17/2020: Stage IVA (cT4a, cN2b, cM0, p16+) - Signed by [name], MD on 03/02/2020   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[place])  01/27/2020 Cancer Staging   Staging form: Pharynx - HPV-Mediated Oropharynx, AJCC 8th Edition - Clinical stage from 01/27/2020: Stage III (cT4, cN1, cM0, p16+) - Signed by [name], MD on 01/31/2020   01/31/2020 Initial Diagnosis   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[place])    HISTORY OF PRESENTING ILLNESS: Patient is hard of hearing.  Accompanied by his wife.   Omar Christian 67 y.o.  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  Chest feels completely clear of all congestion and infection. No throat or mouth pain. No dyspnea. No pain anywhere.   Denies any nausea vomiting abdominal pain.   Review of Systems  Constitutional:  Negative for chills, diaphoresis, fever, malaise/fatigue and weight loss.  HENT:  Negative for nosebleeds and sore throat.   Eyes:  Negative for double vision.  Respiratory:  Negative for cough, hemoptysis, sputum production, shortness of breath and wheezing.   Cardiovascular:  Negative for chest pain, palpitations, orthopnea and leg swelling.  Gastrointestinal:  Negative for abdominal pain, blood in stool, constipation, diarrhea, heartburn, melena, nausea and vomiting.  Genitourinary:  Negative for dysuria, frequency and urgency.  Musculoskeletal:  Negative for back pain and joint pain.  Skin: Negative.  Negative for itching and rash.  Neurological:  Negative for dizziness, tingling, focal weakness, weakness and headaches.  Endo/Heme/Allergies:  Does not bruise/bleed easily.  Psychiatric/Behavioral:  Negative for depression. The  patient is not nervous/anxious and does not have insomnia.     MEDICAL HISTORY:  Past Medical History:  Diagnosis Date   Abnormal findings on cardiac  catheterization 09/30/2015   [facility]   Abnormal myocardial perfusion study 09/24/2015   [facility]   CAD, multiple vessel 09/30/2015   per CATH @ [facility]/DR.PARACHOS   Cancer ([place])    Coronary artery disease    GERD (gastroesophageal reflux disease)    Gout    H/O echocardiogram 09/24/2015   [facility]   Hard of hearing    History of pneumonia    Hyperlipidemia    Hypertension    Pneumonia    Progressive angina ([place])    Tonsil cancer ([place])    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    SURGICAL HISTORY: Past Surgical History:  Procedure Laterality Date   CARDIAC CATHETERIZATION N/A 09/30/2015   Procedure: Left Heart Cath and Coronary Angiography;  Surgeon: [name], MD;  Location: [place] [facility];  Service: Cardiovascular;  Laterality: N/A;   CORONARY ARTERY BYPASS GRAFT N/A 10/08/2015   Procedure: CORONARY ARTERY BYPASS GRAFTING time 4 using left internal mammary and right greater saphenous vein harvested by endovein;  Surgeon: [name], MD;  Location: [place];  Service: Open Heart Surgery;  Laterality: N/A;   CORONARY ARTERY BYPASS GRAFT     IR GASTROSTOMY TUBE MOD SED  02/10/2020   IR GASTROSTOMY TUBE REMOVAL  10/12/2020   IR IMAGING GUIDED PORT INSERTION  02/10/2020   IR REMOVAL TUN ACCESS W/ PORT W/O FL MOD SED  10/12/2020   MULTIPLE EXTRACTIONS WITH ALVEOLOPLASTY N/A 02/04/2020   Procedure: Extraction of tooth #'s [phone], and 32 with alveoloplasty and gross debridement of remaining teeth.;  Surgeon: [name], DDS;  Location: [place] [facility];  Service: Oral Surgery;  Laterality: N/A;   TEE WITHOUT CARDIOVERSION N/A 10/08/2015   Procedure: TRANSESOPHAGEAL ECHOCARDIOGRAM (TEE);  Surgeon: [name], MD;  Location: [place];  Service: Open Heart Surgery;  Laterality: N/A;   VASECTOMY     WISDOM TOOTH EXTRACTION      SOCIAL HISTORY: Social History   Socioeconomic History   Marital status: Married    Spouse name: Not on file   Number of  children: 2   Years of education: Not on file   Highest education level: Not on file  Occupational History   Not on file  Tobacco Use   Smoking status: Never   Smokeless tobacco: Never  Vaping Use   Vaping Use: Never used  Substance and Sexual Activity   Alcohol use: No   Drug use: No   Sexual activity: Not Currently    Birth control/protection: Surgical    Comment: Vasectomy  Other Topics Concern   Not on file  Social History Narrative   Not on file   Social Determinants of Health   Financial Resource Strain: Low Risk  (01/26/2020)   Overall Financial Resource Strain (CARDIA)    Difficulty of Paying Living Expenses: Not very hard  Food Insecurity: Not on file  Transportation Needs: No Transportation Needs (01/26/2020)   PRAPARE - Transportation    Lack of Transportation (Medical): No    Lack of Transportation (Non-Medical): No  Physical Activity: Not on file  Stress: Not on file  Social Connections: Not on file  Intimate Partner Violence: Not on file    FAMILY HISTORY: Family History  Problem Relation Age of Onset   Heart disease Mother    Hypertension Mother  Alcohol abuse Father    Throat cancer Father    Muscular dystrophy Brother    Muscular dystrophy Brother     ALLERGIES:  has No Known Allergies.  MEDICATIONS:  No current outpatient medications on file.   No current facility-administered medications for this visit.    PHYSICAL EXAMINATION:   Vitals:   09/11/22 1046  BP: (!) 160/77  Pulse: 68  Temp: (!) 96.6 F (35.9 C)  SpO2: 97%   Filed Weights   09/11/22 1046  Weight: 228 lb 12.8 oz (103.8 kg)    Physical Exam Vitals and nursing note reviewed.  HENT:     Head: Normocephalic and atraumatic.     Mouth/Throat:     Pharynx: Oropharynx is clear.  Eyes:     Extraocular Movements: Extraocular movements intact.     Pupils: Pupils are equal, round, and reactive to light.  Cardiovascular:     Rate and Rhythm: Normal rate and regular  rhythm.  Pulmonary:     Comments: Decreased breath sounds bilaterally.  Abdominal:     Palpations: Abdomen is soft.  Musculoskeletal:        General: Normal range of motion.     Cervical back: Normal range of motion.  Skin:    General: Skin is warm.  Neurological:     General: No focal deficit present.     Mental Status: He is alert and oriented to person, place, and time.  Psychiatric:        Behavior: Behavior normal.        Judgment: Judgment normal.     LABORATORY DATA:  I have reviewed the data as listed Lab Results  Component Value Date   WBC 4.3 08/24/2022   HGB 14.6 08/24/2022   HCT 42.8 08/24/2022   MCV 90.1 08/24/2022   PLT 144 (L) 08/24/2022   Recent Labs    08/15/22 1012 08/24/22 1149  NA 140 136  K 4.7 4.0  CL 100 104  CO2 23 24  GLUCOSE 127* 116*  BUN 16 13  CREATININE 1.22 1.14  CALCIUM 10.7* 9.3  GFRNONAA  --  >60  PROT 6.9 7.1  ALBUMIN 4.7 4.2  AST 22 28  ALT 28 30  ALKPHOS 76 64  BILITOT 0.7 0.8    RADIOGRAPHIC STUDIES: I have personally reviewed the radiological images as listed and agreed with the findings in the report. NM PET Image Restage (PS) Skull Base to Thigh (F-18 FDG)  Result Date: 09/04/2022 CLINICAL DATA:  Subsequent treatment strategy for tonsillar cancer (HPV positive squamous cell carcinoma). New pulmonary nodules on chest CT. EXAM: NUCLEAR MEDICINE PET SKULL BASE TO THIGH TECHNIQUE: 12.16 mCi F-18 FDG was injected intravenously. Full-ring PET imaging was performed from the skull base to thigh after the radiotracer. CT data was obtained and used for attenuation correction and anatomic localization. This PET facility now utilizes time-of-flight PET imaging. This new technology can increase SUV measurements by 16- 20 % over conventional PET technology. Fasting blood glucose: 133 mg/dl COMPARISON:  PET-CT 07/27/2020. Neck CT 02/07/2021 and chest CT 08/10/2022. FINDINGS: Mediastinal blood pool activity: SUV max 1.7 NECK: No  hypermetabolic cervical lymph nodes are identified. No suspicious activity identified within the pharyngeal mucosal space. Incidental CT findings: Stable radiation changes in the right neck. Bilateral carotid atherosclerosis. CHEST: There are no hypermetabolic mediastinal, hilar or axillary lymph nodes. As seen on recent chest CT, there are at least 3 new pulmonary nodules which are hypermetabolic and suspicious for metastatic disease. The largest  nodule is located posteromedially in the left lower lobe, measuring 1.6 cm on image 71/4 and has an SUV max of 8.4. 9 mm nodule posteriorly in the left upper lobe has an SUV max of 4.5. 1.0 cm right lower lobe nodule on image 73/4 has an SUV max of 4.5. There are possible additional new tiny nodules which are too small to characterize. Incidental CT findings: Status post median sternotomy and CABG. Atherosclerosis of the aorta, great vessels and coronary arteries. Small hiatal hernia. Mild long segment esophageal metabolic activity, likely due to esophagitis. ABDOMEN/PELVIS: There is no hypermetabolic activity within the liver, adrenal glands, spleen or pancreas. Single small hypermetabolic lymph node in the left base of the mesentery, measuring 9 mm on image 119/4 (SUV max 4.0). No other hypermetabolic lymph nodes identified in the abdomen or pelvis. Bowel activity is within physiologic limits. Incidental CT findings: Generalized hepatic steatosis. There are small dependent gallstones in the gallbladder lumen. There is a chronic calcified mass in the left base of mesentery which measures 3.0 x 2.1 cm on image 16/4. This demonstrates no metabolic activity and appears similar in size to CTs dating back to 01/09/2017, possibly postinflammatory. Aortic and branch vessel atherosclerosis. No ascites or peritoneal nodularity. SKELETON: There is no hypermetabolic activity to suggest osseous metastatic disease. Incidental CT findings: Mild spondylosis. IMPRESSION: 1. The 3 largest  pulmonary nodule seen on recent chest CT are hypermetabolic, most consistent with metastatic disease. 2. Single small hypermetabolic lymph node in the left base of the mesentery is nonspecific. Cannot exclude metastatic disease. 3. No evidence of local recurrence in the neck or other distant metastases in the abdomen or pelvis. 4. Hepatic steatosis, cholelithiasis and chronic calcified left mesenteric lesion. 5.  Aortic Atherosclerosis (ICD10-I70.0). Electronically Signed   By: Richardean Sale M.D.   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  However concern for recurrence-FEB 10th, 2024-  Multiple pulmonary nodules in the lungs, new compared to the prior study, concerning for metastatic disease. PET  09/01/2022-The 3 largest pulmonary nodule seen on recent chest CT are hypermetabolic, most consistent with metastatic disease.  Single small hypermetabolic lymph node in the left base of the mesentery is nonspecific. Cannot exclude metastatic disease. No evidence of local recurrence in the neck or other distant metastases in the abdomen or pelvis.  #  recommend a biopsy biopsy based on the results of the PET scan.  We also review at the tumor conference.    # Mild intermittent hypercalcemia PCP -vit 25-OH-28 [FEB [id number]- recommend ca VIt D.  Multiple myeloma- WNL;  panel kappa, lambda light chain ratio- 1.8; monitor for now.   # Hearing loss s/p Cisplatin-monitor for now. Stable.   # Hx of CAD- stable  #Incidental findings on Imaging  CT FEB , 2024: Hepatic steatosis, cholelithiasis and chronic calcified left mesenteric lesion;  Aortic Atherosclerosis (ICD10-I70.0)I reviewed/discussed/counseled the patient.  Daughter unavailable to speak over the phone.  Will call [name] after the conference with the plan.   * RN Call [name] after conference # DISPOSITION: # follow up TBD- Dr.B  # I  reviewed the blood work- with the patient in detail; also reviewed the imaging independently [as summarized above]; and with the patient in detail.   Above plan of care was discussed with patient/family in detail.  My contact information was given to the patient/family.     Cammie Sickle,  MD 09/11/2022 12:35 PM

## 2022-09-11 NOTE — Progress Notes (Signed)
Chest feels completely clear of all congestion and infection. No throat or mouth pain. No dyspnea. No pain anywhere. Here for PET scan results.

## 2022-09-11 NOTE — Patient Instructions (Signed)
#   Recommend Vit D3- 1000/day.

## 2022-09-11 NOTE — Assessment & Plan Note (Addendum)
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  However concern for recurrence-FEB 10th, 2024-  Multiple pulmonary nodules in the lungs, new compared to the prior study, concerning for metastatic disease. PET  09/01/2022-The 3 largest pulmonary nodule seen on recent chest CT are hypermetabolic, most consistent with metastatic disease.  Single small hypermetabolic lymph node in the left base of the mesentery is nonspecific. Cannot exclude metastatic disease. No evidence of local recurrence in the neck or other distant metastases in the abdomen or pelvis.  #  recommend a biopsy biopsy based on the results of the PET scan.  We also review at the tumor conference.    # Mild intermittent hypercalcemia PCP -vit 25-OH-28 [FEB 1610]- recommend ca VIt D.  Multiple myeloma- WNL;  panel kappa, lambda light chain ratio- 1.8; monitor for now.   # Hearing loss s/p Cisplatin-monitor for now. Stable.   # Hx of CAD- stable  #Incidental findings on Imaging  CT FEB , 2024: Hepatic steatosis, cholelithiasis and chronic calcified left mesenteric lesion;  Aortic Atherosclerosis (ICD10-I70.0)I reviewed/discussed/counseled the patient.  Daughter unavailable to speak over the phone.  Will call [name] after the conference with the plan.   * RN Call [name] after conference # DISPOSITION: # follow up TBD- Dr.B  # I reviewed the blood work- with the patient in detail; also reviewed the imaging independently [as summarized above]; and with the patient in detail.

## 2022-09-14 ENCOUNTER — Other Ambulatory Visit: Payer: Self-pay | Admitting: *Deleted

## 2022-09-14 ENCOUNTER — Other Ambulatory Visit: Payer: Medicare HMO

## 2022-09-14 ENCOUNTER — Telehealth: Payer: Self-pay | Admitting: Internal Medicine

## 2022-09-14 DIAGNOSIS — R918 Other nonspecific abnormal finding of lung field: Secondary | ICD-10-CM

## 2022-09-14 DIAGNOSIS — C099 Malignant neoplasm of tonsil, unspecified: Secondary | ICD-10-CM

## 2022-09-14 NOTE — Telephone Encounter (Signed)
Patient imaging reviewed at the tumor conference-concerning for recurrent cancer.  Recommend evaluation with pulmonary for biopsy.  I tried to reach patient's daughter Lequita Asal per family request]-unable to reach.  Left a voicemail with my above recommendations.  Please make a referral to Dr.Gonzalez/ or Dr.Dgyali asap re: lung nodules.  Please schedule appointment-1 month -MD; no labs-  Thanks, GB

## 2022-09-19 ENCOUNTER — Encounter: Payer: Self-pay | Admitting: Pulmonary Disease

## 2022-09-19 ENCOUNTER — Ambulatory Visit: Payer: Medicare HMO | Admitting: Pulmonary Disease

## 2022-09-19 ENCOUNTER — Telehealth: Payer: Self-pay

## 2022-09-19 VITALS — BP 158/90 | HR 74 | Temp 98.3°F | Ht 72.0 in | Wt 231.4 lb

## 2022-09-19 DIAGNOSIS — R918 Other nonspecific abnormal finding of lung field: Secondary | ICD-10-CM

## 2022-09-19 DIAGNOSIS — H9103 Ototoxic hearing loss, bilateral: Secondary | ICD-10-CM | POA: Diagnosis not present

## 2022-09-19 DIAGNOSIS — C099 Malignant neoplasm of tonsil, unspecified: Secondary | ICD-10-CM

## 2022-09-19 NOTE — Progress Notes (Signed)
Subjective:    Patient ID: Omar Christian, male    DOB: 1956/05/01, 67 y.o.   MRN: [id number] Patient Care Team: [name], PA-C as PCP - General (Physician Assistant) [name], MD (Inactive) as Consulting Physician (Cardiothoracic Surgery) [name], MD as Consulting Physician (Cardiology) [name], MD as Consulting Physician (Hematology and Oncology) [name], MD as Attending Physician (Radiation Oncology) [name], [name], RN as Oncology Nurse Navigator [name], [name], [place] as Speech Language Pathologist (Speech Pathology) [name], DDS (Inactive) as Consulting Physician (Dentistry) [name], [name], PT as Physical Therapist (Physical Therapy) [name], LCSW (Inactive) as Social Worker (General Practice) [name], LCSW (Inactive) as Social Worker [name], RD as Dietitian (Nutrition)  Chief Complaint  Patient presents with   Consult    Nodule. No SOB, wheezing or cough.     HPI Patient is a 67 year old, lifelong never smoker, with a history as noted below who presents for evaluation of multiple lung nodules noted on chest CT of 10 August 2022.  He is kindly referred by Dr. Charlaine Dalton for consideration of robotic assisted bronchoscopy for diagnosis.  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  He received concurrent chemoradiation.  He completed radiation 12 April 2020.  Chemotherapy on 23 March 2020.  He had been followed for a time at [facility].  Last seen there 18 January 2021.  The patient noted that he developed some congestion in his sinuses and chest around 24 June 2022.  He was evaluated at the emergency room and received Augmentin and prednisone taper pack.  Chest x-ray performed during that visit was reportedly normal.  However the patient had persistent symptoms and was then seen by his  primary practitioner [name], PA.  Because of the persistence of symptoms another chest x-ray was obtained on 20 July 2022 this time a faint nodular density over the left upper lobe was visualized and it was recommended that a noncontrast CT be performed.  Noncontrast CT was obtained 10 August 2022 and this shows multiple pulmonary nodules in the lungs that were new compared to the prior study concerning for metastatic disease.  The largest of this nodules is posterior medial aspect of the superior segment of the left lower lobe measuring 1.7 x 1.3 cm other pulmonary nodules include a 10 x 9 mm nodule in the posterior medial right lower lobe and an 8 mm nodule in the left upper lobe.  These nodules are very concerning for metastatic disease.  Tissue diagnosis is necessary for determining treatment strategy for the patient.  The case was discussed during Tumor Board and the nodules are in a challenging position for IR to perform biopsy safely.  The nodules are amenable for diagnosis with robotic assisted bronchoscopy.  The patient has been asymptomatic with regards to these findings.  He still concerned that the findings are related to his recent issues with congestion and bronchitis.  I have assured him that bronchitis would not cause this type of picture.  The patient and his wife who was present during the visit had opportunity to ask questions and these were answered to their satisfaction.  They understand the potential benefits, limitations and complications of robotic assisted navigational bronchoscopy and agreed to go ahead.   Review of Systems A 10 point review of systems was performed and it is as noted above otherwise  negative.  Past Medical History:  Diagnosis Date   Abnormal findings on cardiac catheterization 09/30/2015   [facility]   Abnormal myocardial perfusion study 09/24/2015   [place]   CAD, multiple vessel 09/30/2015   per CATH @ [facility]/DR.PARACHOS   Cancer [facility])     Coronary artery disease    GERD (gastroesophageal reflux disease)    Gout    H/O echocardiogram 09/24/2015   [facility]   Hard of hearing    History of pneumonia    Hyperlipidemia    Hypertension    Pneumonia    Progressive angina ([place])    Tonsil cancer ([place])    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   Past Surgical History:  Procedure Laterality Date   CARDIAC CATHETERIZATION N/A 09/30/2015   Procedure: Left Heart Cath and Coronary Angiography;  Surgeon: [name], MD;  Location: [place] CV LAB;  Service: Cardiovascular;  Laterality: N/A;   CORONARY ARTERY BYPASS GRAFT N/A 10/08/2015   Procedure: CORONARY ARTERY BYPASS GRAFTING time 4 using left internal mammary and right greater saphenous vein harvested by endovein;  Surgeon: [name], MD;  Location: [place];  Service: Open Heart Surgery;  Laterality: N/A;   CORONARY ARTERY BYPASS GRAFT     IR GASTROSTOMY TUBE MOD SED  02/10/2020   IR GASTROSTOMY TUBE REMOVAL  10/12/2020   IR IMAGING GUIDED PORT INSERTION  02/10/2020   IR REMOVAL TUN ACCESS W/ PORT W/O FL MOD SED  10/12/2020   MULTIPLE EXTRACTIONS WITH ALVEOLOPLASTY N/A 02/04/2020   Procedure: Extraction of tooth #'s [id number], and 32 with alveoloplasty and gross debridement of remaining teeth.;  Surgeon: [name], DDS;  Location: [place];  Service: Oral Surgery;  Laterality: N/A;   TEE WITHOUT CARDIOVERSION N/A 10/08/2015   Procedure: TRANSESOPHAGEAL ECHOCARDIOGRAM (TEE);  Surgeon: [name], MD;  Location: [place];  Service: Open Heart Surgery;  Laterality: N/A;   VASECTOMY     WISDOM TOOTH EXTRACTION     Patient Active Problem List   Diagnosis Date Noted   Hepatic steatosis 08/14/2022   Cholelithiasis 08/14/2022   Port-A-Cath in place 04/01/2020   Fever    Tachycardia    Severe sepsis ([place]) 03/04/2020   Patient on antineoplastic chemotherapy regimen    Goals of care, counseling/discussion    Palliative care by specialist    DNR (do  not resuscitate) discussion    Sepsis ([place]) 03/02/2020   Cancer of tonsil ([place]) 02/04/2020   Poor dentition 02/04/2020   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[place]) 01/31/2020   Tonsillar cancer ([place]) 01/27/2020   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   Unstable angina pectoris ([place]) 10/04/2015   Progressive angina ([facility])    Abnormal findings on cardiac catheterization 09/30/2015   CAD, multiple vessel 09/30/2015   Abnormal findings diagnostic imaging of heart and coronary circulation 09/30/2015   CAD in native artery 09/30/2015   H/O echocardiogram 09/24/2015   Abnormal myocardial perfusion study 09/24/2015   Chest pain 09/03/2015   Allergic rhinitis 08/27/2015   H/O: gout 08/27/2015   HLD (hyperlipidemia) 08/27/2015   Benign hypertension 08/27/2015   Acid reflux 08/27/2015   Essential (primary) hypertension 08/27/2015   H/O disease 08/27/2015   Family History  Problem Relation Age of Onset   Heart disease Mother    Hypertension Mother    Alcohol abuse Father    Throat cancer Father    Muscular dystrophy Brother    Muscular dystrophy Brother    No Known  Allergies  No outpatient medications have been marked as taking for the 09/19/22 encounter (Office Visit) with Tyler Pita, MD.  Patient currently on no medications.  Immunization History  Administered Date(s) Administered   [employer] Vaccination 12/12/2019, 01/09/2020   PFIZER(Purple Top)SARS-COV-2 Vaccination 07/28/2020      Objective:   Physical Exam BP (!) 158/90 (BP Location: Left Arm, Cuff Size: Large)   Pulse 74   Temp 98.3 F (36.8 C)   Ht 6' (1.829 m)   Wt 231 lb 6.4 oz (105 kg)   SpO2 97%   BMI 31.38 kg/m   SpO2: 97 % O2 Device: None (Room air)  GENERAL: Well-developed, overweight, gentleman, no acute distress.  Fully ambulatory.  Very hard of hearing HEAD: Normocephalic, atraumatic.  EYES: Pupils equal, round, reactive to light.  No scleral icterus.  MOUTH: Mallampati IV, missing teeth.  Oral  mucosa moist. NECK: Supple. No thyromegaly. Trachea midline. No JVD.  No adenopathy. PULMONARY: Good air entry bilaterally.  No adventitious sounds. CARDIOVASCULAR: S1 and S2. Regular rate and rhythm.  No rubs, murmurs or gallops heard. ABDOMEN: Benign. MUSCULOSKELETAL: No joint deformity, no clubbing, no edema.  NEUROLOGIC: Very hard of hearing.  Otherwise nonfocal. SKIN: Intact,warm,dry. PSYCH: Mood and behavior normal.   Representative images from CT chest performed 10 August 2022 showing multiple nodules as noted by arrows:     PET CT performed 01 September 2022 shows FDG avidity on all these nodules.     Assessment & Plan:     ICD-10-CM   1. Multiple pulmonary nodules determined by computed tomography of lung  R91.8 CT SUPER D CHEST WO MONARCH PILOT   Given history of tonsillar cancer, concern for metastatic disease PET/CT shows avidity in these nodules Robotic assisted bronchoscopy for diagnosis    2. Tonsillar cancer (HCC)  C09.9    Squamous cell carcinoma T4N2BM0 stage IVa HPV status: Positive Status post chemoradiation    3. 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    Answer:   [place]   Patient has been scheduled for biopsy on 27 September 2022 at 12:30 PM.  The patient understands the potential benefits, limitations and complications of the procedure and agrees to proceed.  Benefits, limitations and potential complications of the procedure were discussed with the patient/family.  Complications from bronchoscopy are rare and most often minor, but if they occur they may include breathing difficulty, vocal cord spasm, hoarseness, slight fever, vomiting, dizziness, bronchospasm, infection, low blood oxygen,  bleeding from biopsy site, or an allergic reaction to medications.  It is uncommon for patients to experience other more serious complications for example: Collapsed lung requiring chest tube placement, respiratory failure, heart attack and/or cardiac arrhythmia.  Patient agrees to proceed.  We will see the patient in follow-up in 4 to 6 weeks time however we will stay in contact with him pre, during and postprocedure.  For the purposes of the procedure ventilation strategy will be as follows: Tidal volume 540, PEEP 8-12, FiO2 0.4 to 0.7 during procedure.  Renold Don, MD Advanced Bronchoscopy PCCM [facility]    *This note was dictated using voice recognition software/Dragon.  Despite best efforts to proofread, errors can occur which can change the meaning. Any transcriptional errors that result from this process are unintentional and may not be fully corrected at the time of dictation.

## 2022-09-19 NOTE — H&P (View-Only) (Signed)
Subjective:    Patient ID: Omar Christian, male    DOB: [birth year], 67 y.o.   MRN: [id number] Patient Care Team: [name], PA-C as PCP - General (Physician Assistant) [name], MD (Inactive) as Consulting Physician (Cardiothoracic Surgery) [name], MD as Consulting Physician (Cardiology) [name], MD as Consulting Physician (Hematology and Oncology) [name], MD as Attending Physician (Radiation Oncology) [name], [name], RN as Oncology Nurse Navigator [name], [name], [name] as Speech Language Pathologist (Speech Pathology) [name], DDS (Inactive) as Consulting Physician (Dentistry) [name], [name], PT as Physical Therapist (Physical Therapy) [name], LCSW (Inactive) as Social Worker (General Practice) [name], LCSW (Inactive) as Social Worker [name], RD as Dietitian (Nutrition)  Chief Complaint  Patient presents with   Consult    Nodule. No SOB, wheezing or cough.     HPI Patient is a 67 year old, lifelong never smoker, with a history as noted below who presents for evaluation of multiple lung nodules noted on chest CT of 10 August 2022.  He is kindly referred by Dr. Charlaine Dalton for consideration of robotic assisted bronchoscopy for diagnosis.  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  He received concurrent chemoradiation.  He completed radiation 12 April 2020.  Chemotherapy on 23 March 2020.  He had been followed for a time at [facility].  Last seen there 18 January 2021.  The patient noted that he developed some congestion in his sinuses and chest around 24 June 2022.  He was evaluated at the emergency room and received Augmentin and prednisone taper pack.  Chest x-ray performed during that visit was reportedly normal.  However the patient had persistent symptoms and was then seen by his  primary practitioner [name], PA.  Because of the persistence of symptoms another chest x-ray was obtained on 20 July 2022 this time a faint nodular density over the left upper lobe was visualized and it was recommended that a noncontrast CT be performed.  Noncontrast CT was obtained 10 August 2022 and this shows multiple pulmonary nodules in the lungs that were new compared to the prior study concerning for metastatic disease.  The largest of this nodules is posterior medial aspect of the superior segment of the left lower lobe measuring 1.7 x 1.3 cm other pulmonary nodules include a 10 x 9 mm nodule in the posterior medial right lower lobe and an 8 mm nodule in the left upper lobe.  These nodules are very concerning for metastatic disease.  Tissue diagnosis is necessary for determining treatment strategy for the patient.  The case was discussed during Tumor Board and the nodules are in a challenging position for IR to perform biopsy safely.  The nodules are amenable for diagnosis with robotic assisted bronchoscopy.  The patient has been asymptomatic with regards to these findings.  He still concerned that the findings are related to his recent issues with congestion and bronchitis.  I have assured him that bronchitis would not cause this type of picture.  The patient and his wife who was present during the visit had opportunity to ask questions and these were answered to their satisfaction.  They understand the potential benefits, limitations and complications of robotic assisted navigational bronchoscopy and agreed to go ahead.   Review of Systems A 10 point review of systems was performed and it is as noted above otherwise  negative.  Past Medical History:  Diagnosis Date   Abnormal findings on cardiac catheterization 09/30/2015   [facility]   Abnormal myocardial perfusion study 09/24/2015   [place]   CAD, multiple vessel 09/30/2015   per CATH @ [facility]/DR.PARACHOS   Cancer [facility])     Coronary artery disease    GERD (gastroesophageal reflux disease)    Gout    H/O echocardiogram 09/24/2015   [facility]   Hard of hearing    History of pneumonia    Hyperlipidemia    Hypertension    Pneumonia    Progressive angina ([place])    Tonsil cancer ([place])    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   Past Surgical History:  Procedure Laterality Date   CARDIAC CATHETERIZATION N/A 09/30/2015   Procedure: Left Heart Cath and Coronary Angiography;  Surgeon: [name], MD;  Location: [place] CV LAB;  Service: Cardiovascular;  Laterality: N/A;   CORONARY ARTERY BYPASS GRAFT N/A 10/08/2015   Procedure: CORONARY ARTERY BYPASS GRAFTING time 4 using left internal mammary and right greater saphenous vein harvested by endovein;  Surgeon: [name], MD;  Location: [place];  Service: Open Heart Surgery;  Laterality: N/A;   CORONARY ARTERY BYPASS GRAFT     IR GASTROSTOMY TUBE MOD SED  02/10/2020   IR GASTROSTOMY TUBE REMOVAL  10/12/2020   IR IMAGING GUIDED PORT INSERTION  02/10/2020   IR REMOVAL TUN ACCESS W/ PORT W/O FL MOD SED  10/12/2020   MULTIPLE EXTRACTIONS WITH ALVEOLOPLASTY N/A 02/04/2020   Procedure: Extraction of tooth #'s [id number], and 32 with alveoloplasty and gross debridement of remaining teeth.;  Surgeon: [name], DDS;  Location: [place];  Service: Oral Surgery;  Laterality: N/A;   TEE WITHOUT CARDIOVERSION N/A 10/08/2015   Procedure: TRANSESOPHAGEAL ECHOCARDIOGRAM (TEE);  Surgeon: [name], MD;  Location: [place];  Service: Open Heart Surgery;  Laterality: N/A;   VASECTOMY     WISDOM TOOTH EXTRACTION     Patient Active Problem List   Diagnosis Date Noted   Hepatic steatosis 08/14/2022   Cholelithiasis 08/14/2022   Port-A-Cath in place 04/01/2020   Fever    Tachycardia    Severe sepsis ([place]) 03/04/2020   Patient on antineoplastic chemotherapy regimen    Goals of care, counseling/discussion    Palliative care by specialist    DNR (do  not resuscitate) discussion    Sepsis ([place]) 03/02/2020   Cancer of tonsil ([place]) 02/04/2020   Poor dentition 02/04/2020   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[place]) 01/31/2020   Tonsillar cancer ([place]) 01/27/2020   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   Unstable angina pectoris ([place]) 10/04/2015   Progressive angina (HCC)    Abnormal findings on cardiac catheterization 09/30/2015   CAD, multiple vessel 09/30/2015   Abnormal findings diagnostic imaging of heart and coronary circulation 09/30/2015   CAD in native artery 09/30/2015   H/O echocardiogram 09/24/2015   Abnormal myocardial perfusion study 09/24/2015   Chest pain 09/03/2015   Allergic rhinitis 08/27/2015   H/O: gout 08/27/2015   HLD (hyperlipidemia) 08/27/2015   Benign hypertension 08/27/2015   Acid reflux 08/27/2015   Essential (primary) hypertension 08/27/2015   H/O disease 08/27/2015   Family History  Problem Relation Age of Onset   Heart disease Mother    Hypertension Mother    Alcohol abuse Father    Throat cancer Father    Muscular dystrophy Brother    Muscular dystrophy Brother    No Known  Allergies  No outpatient medications have been marked as taking for the 09/19/22 encounter (Office Visit) with Tyler Pita, MD.  Patient currently on no medications.  Immunization History  Administered Date(s) Administered   [employer] Vaccination 12/12/2019, 01/09/2020   PFIZER(Purple Top)SARS-COV-2 Vaccination 07/28/2020      Objective:   Physical Exam BP (!) 158/90 (BP Location: Left Arm, Cuff Size: Large)   Pulse 74   Temp 98.3 F (36.8 C)   Ht 6' (1.829 m)   Wt 231 lb 6.4 oz (105 kg)   SpO2 97%   BMI 31.38 kg/m   SpO2: 97 % O2 Device: None (Room air)  GENERAL: Well-developed, overweight, gentleman, no acute distress.  Fully ambulatory.  Very hard of hearing HEAD: Normocephalic, atraumatic.  EYES: Pupils equal, round, reactive to light.  No scleral icterus.  MOUTH: Mallampati IV, missing teeth.  Oral  mucosa moist. NECK: Supple. No thyromegaly. Trachea midline. No JVD.  No adenopathy. PULMONARY: Good air entry bilaterally.  No adventitious sounds. CARDIOVASCULAR: S1 and S2. Regular rate and rhythm.  No rubs, murmurs or gallops heard. ABDOMEN: Benign. MUSCULOSKELETAL: No joint deformity, no clubbing, no edema.  NEUROLOGIC: Very hard of hearing.  Otherwise nonfocal. SKIN: Intact,warm,dry. PSYCH: Mood and behavior normal.   Representative images from CT chest performed 10 August 2022 showing multiple nodules as noted by arrows:     PET CT performed 01 September 2022 shows FDG avidity on all these nodules.     Assessment & Plan:     ICD-10-CM   1. Multiple pulmonary nodules determined by computed tomography of lung  R91.8 CT SUPER D CHEST WO [facility]ARCH PILOT   Given history of tonsillar cancer, concern for metastatic disease PET/CT shows avidity in these nodules Robotic assisted bronchoscopy for diagnosis    2. Tonsillar cancer (HCC)  C09.9    Squamous cell carcinoma T4N2BM0 stage IVa HPV status: Positive Status post chemoradiation    3. 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    Answer:   [place]   Patient has been scheduled for biopsy on 27 September 2022 at 12:30 PM.  The patient understands the potential benefits, limitations and complications of the procedure and agrees to proceed.  Benefits, limitations and potential complications of the procedure were discussed with the patient/family.  Complications from bronchoscopy are rare and most often minor, but if they occur they may include breathing difficulty, vocal cord spasm, hoarseness, slight fever, vomiting, dizziness, bronchospasm, infection, low blood oxygen,  bleeding from biopsy site, or an allergic reaction to medications.  It is uncommon for patients to experience other more serious complications for example: Collapsed lung requiring chest tube placement, respiratory failure, heart attack and/or cardiac arrhythmia.  Patient agrees to proceed.  We will see the patient in follow-up in 4 to 6 weeks time however we will stay in contact with him pre, during and postprocedure.  For the purposes of the procedure ventilation strategy will be as follows: Tidal volume 540, PEEP 8-12, FiO2 0.4 to 0.7 during procedure.  Renold Don, MD Advanced Bronchoscopy PCCM [facility]    *This note was dictated using voice recognition software/Dragon.  Despite best efforts to proofread, errors can occur which can change the meaning. Any transcriptional errors that result from this process are unintentional and may not be fully corrected at the time of dictation.

## 2022-09-19 NOTE — Patient Instructions (Signed)
We are going to set you up for a biopsy on 27 March at 12:30 PM  She is going to be in the medical mall in same-day surgery.  You will receive more instructions through the preadmission clinic.  We will see you in follow-up in 4 to 6 weeks time but we will be in contact with you before during and after the procedure.

## 2022-09-19 NOTE — Telephone Encounter (Signed)
Robotic Bronch with [facility] 09/27/2022 at 12:30pm Lung Nodule Omar Christian, [id number], [place] please see Bronch info.

## 2022-09-20 NOTE — Telephone Encounter (Signed)
Pre-Admit- 09/21/22  1-5pm Covid Test- 09/26/2022 10:00am   I tried to contact the patient. I did not get an answer and his voicemail box was full. I will try again later.

## 2022-09-20 NOTE — Telephone Encounter (Signed)
For the codes 31627, [id number], [phone] Prior Auth Not Required Refer # [id number]

## 2022-09-20 NOTE — Telephone Encounter (Signed)
I notified the patient's wife(DPR). She said she had already heard from Pre-Admit about the patient's appointment. She is questioning if the appointment tomorrow is a phone interview or if they will need to go into the office because she has heard both. I told her I would have to call pre-admit and confirm that and call her back.   I tried to contact Pre-Admit but they are gone for the day. I will try again tomorrow.

## 2022-09-21 ENCOUNTER — Other Ambulatory Visit: Payer: Self-pay

## 2022-09-21 ENCOUNTER — Encounter
Admission: RE | Admit: 2022-09-21 | Discharge: 2022-09-21 | Disposition: A | Discharge status: Home / Self Care | Payer: Medicare HMO | Source: Ambulatory Visit | Attending: Pulmonary Disease | Admitting: Pulmonary Disease

## 2022-09-21 DIAGNOSIS — Z01812 Encounter for preprocedural laboratory examination: Secondary | ICD-10-CM

## 2022-09-21 NOTE — Patient Instructions (Addendum)
Your procedure is scheduled on: 09/27/22 - Wednesday Report to the Registration Desk on the 1st floor of the [facility]. To find out your arrival time, please call [phone] between 1PM - 3PM on: 09/26/22 - Tuesday If your arrival time is 6:00 am, do not arrive before that time as the [facility] entrance doors do not open until 6:00 am.  REMEMBER: Instructions that are not followed completely may result in serious medical risk, up to and including death; or upon the discretion of your surgeon and anesthesiologist your surgery may need to be rescheduled.  Do not eat food or drink any liquids after midnight the night before surgery.  No gum chewing or hard candies.  One week prior to surgery: Stop Anti-inflammatories (NSAIDS) such as Advil, Aleve, Ibuprofen, Motrin, Naproxen, Naprosyn and Aspirin based products such as Excedrin, Goody's Powder, BC Powder.  Stop ANY OVER THE COUNTER supplements until after surgery.  You may however, continue to take Tylenol if needed for pain up until the day of surgery.   TAKE ONLY THESE MEDICATIONS THE MORNING OF SURGERY WITH A SIP OF WATER:  NONE  No Alcohol for 24 hours before or after surgery.  No Smoking including e-cigarettes for 24 hours before surgery.  No chewable tobacco products for at least 6 hours before surgery.  No nicotine patches on the day of surgery.  Do not use any "recreational" drugs for at least a week (preferably 2 weeks) before your surgery.  Please be advised that the combination of cocaine and anesthesia may have negative outcomes, up to and including death. If you test positive for cocaine, your surgery will be cancelled.  On the morning of surgery brush your teeth with toothpaste and water, you may rinse your mouth with mouthwash if you wish. Do not swallow any toothpaste or mouthwash.  Do not wear jewelry, make-up, hairpins, clips or nail polish.  Do not wear lotions, powders, or perfumes.   Do not shave  body hair from the neck down 48 hours before surgery.  Contact lenses, hearing aids and dentures may not be worn into surgery.  Do not bring valuables to the hospital. [facility] is not responsible for any missing/lost belongings or valuables.   Notify your doctor if there is any change in your medical condition (cold, fever, infection).  Wear comfortable clothing (specific to your surgery type) to the hospital.  After surgery, you can help prevent lung complications by doing breathing exercises.  Take deep breaths and cough every 1-2 hours. Your doctor may order a device called an Incentive Spirometer to help you take deep breaths. When coughing or sneezing, hold a pillow firmly against your incision with both hands. This is called "splinting." Doing this helps protect your incision. It also decreases belly discomfort.  If you are being admitted to the hospital overnight, leave your suitcase in the car. After surgery it may be brought to your room.  In case of increased patient census, it may be necessary for you, the patient, to continue your postoperative care in the Same Day Surgery department.  If you are being discharged the day of surgery, you will not be allowed to drive home. You will need a responsible individual to drive you home and stay with you for 24 hours after surgery.   If you are taking public transportation, you will need to have a responsible individual with you.  Please call the [facility] Dept. at [phone] if you have any questions about these  instructions.  Surgery Visitation Policy:  Patients undergoing a surgery or procedure may have two family members or support persons with them as long as the person is not COVID-19 positive or experiencing its symptoms.   Inpatient Visitation:    Visiting hours are 7 a.m. to 8 p.m. Up to four visitors are allowed at one time in a patient room. The visitors may rotate out with other people during the day.  One designated support person (adult) may remain overnight.

## 2022-09-21 NOTE — Telephone Encounter (Signed)
I have confirmed with Pre-Admit that the appointment today is a phone interview and I notified the patient's wife.  Nothing further needed.

## 2022-09-25 ENCOUNTER — Ambulatory Visit
Admission: RE | Admit: 2022-09-25 | Discharge: 2022-09-25 | Disposition: A | Discharge status: Home / Self Care | Payer: Medicare HMO | Source: Ambulatory Visit | Attending: Pulmonary Disease | Admitting: Pulmonary Disease

## 2022-09-25 DIAGNOSIS — R918 Other nonspecific abnormal finding of lung field: Secondary | ICD-10-CM | POA: Insufficient documentation

## 2022-09-26 ENCOUNTER — Encounter
Admission: RE | Admit: 2022-09-26 | Discharge: 2022-09-26 | Disposition: A | Discharge status: Home / Self Care | Payer: Medicare HMO | Source: Ambulatory Visit | Attending: Pulmonary Disease | Admitting: Pulmonary Disease

## 2022-09-26 DIAGNOSIS — Z1152 Encounter for screening for COVID-19: Secondary | ICD-10-CM | POA: Diagnosis not present

## 2022-09-26 DIAGNOSIS — Z01812 Encounter for preprocedural laboratory examination: Secondary | ICD-10-CM

## 2022-09-26 DIAGNOSIS — I4510 Unspecified right bundle-branch block: Secondary | ICD-10-CM | POA: Insufficient documentation

## 2022-09-26 DIAGNOSIS — Z01810 Encounter for preprocedural cardiovascular examination: Secondary | ICD-10-CM

## 2022-09-26 DIAGNOSIS — Z01818 Encounter for other preprocedural examination: Secondary | ICD-10-CM | POA: Insufficient documentation

## 2022-09-26 LAB — SARS CORONAVIRUS 2 (TAT 6-24 HRS): SARS Coronavirus 2: NEGATIVE

## 2022-09-26 MED ORDER — CHLORHEXIDINE GLUCONATE 0.12 % MT SOLN
15.0000 mL | Freq: Once | OROMUCOSAL | Status: AC
  Administered 2022-09-27: 15 mL via OROMUCOSAL

## 2022-09-26 MED ORDER — ORAL CARE MOUTH RINSE: 15.0000 mL | Freq: Once | OROMUCOSAL | Status: AC

## 2022-09-26 MED ORDER — FAMOTIDINE 20 MG PO TABS
20.0000 mg | ORAL_TABLET | Freq: Once | ORAL | Status: AC
  Administered 2022-09-27: 20 mg via ORAL

## 2022-09-26 MED ORDER — IPRATROPIUM-ALBUTEROL 0.5-2.5 (3) MG/3ML IN SOLN
3.0000 mL | Freq: Once | RESPIRATORY_TRACT | Status: AC
  Administered 2022-09-27: 3 mL via RESPIRATORY_TRACT

## 2022-09-26 MED ORDER — LACTATED RINGERS IV SOLN: INTRAVENOUS | Status: DC

## 2022-09-26 MED ORDER — SODIUM CHLORIDE 0.9 % IV SOLN: Freq: Once | INTRAVENOUS | Status: AC

## 2022-09-27 ENCOUNTER — Ambulatory Visit: Payer: Medicare HMO

## 2022-09-27 ENCOUNTER — Other Ambulatory Visit: Payer: Self-pay

## 2022-09-27 ENCOUNTER — Encounter
Admission: RE | Disposition: A | Discharge status: Home / Self Care | Payer: Self-pay | Source: Home / Self Care | Attending: Pulmonary Disease

## 2022-09-27 ENCOUNTER — Ambulatory Visit
Admission: RE | Admit: 2022-09-27 | Discharge: 2022-09-27 | Disposition: A | Discharge status: Home / Self Care | Payer: Medicare HMO | Attending: Pulmonary Disease | Admitting: Pulmonary Disease

## 2022-09-27 ENCOUNTER — Ambulatory Visit: Payer: Medicare HMO | Admitting: Urgent Care

## 2022-09-27 ENCOUNTER — Encounter: Payer: Self-pay | Admitting: Pulmonary Disease

## 2022-09-27 DIAGNOSIS — K219 Gastro-esophageal reflux disease without esophagitis: Secondary | ICD-10-CM | POA: Insufficient documentation

## 2022-09-27 DIAGNOSIS — R918 Other nonspecific abnormal finding of lung field: Secondary | ICD-10-CM | POA: Diagnosis not present

## 2022-09-27 DIAGNOSIS — I10 Essential (primary) hypertension: Secondary | ICD-10-CM | POA: Diagnosis not present

## 2022-09-27 DIAGNOSIS — E785 Hyperlipidemia, unspecified: Secondary | ICD-10-CM | POA: Insufficient documentation

## 2022-09-27 DIAGNOSIS — Z9221 Personal history of antineoplastic chemotherapy: Secondary | ICD-10-CM | POA: Diagnosis not present

## 2022-09-27 DIAGNOSIS — C099 Malignant neoplasm of tonsil, unspecified: Secondary | ICD-10-CM | POA: Diagnosis not present

## 2022-09-27 DIAGNOSIS — T451X5A Adverse effect of antineoplastic and immunosuppressive drugs, initial encounter: Secondary | ICD-10-CM | POA: Diagnosis not present

## 2022-09-27 DIAGNOSIS — I2510 Atherosclerotic heart disease of native coronary artery without angina pectoris: Secondary | ICD-10-CM | POA: Diagnosis not present

## 2022-09-27 DIAGNOSIS — H9103 Ototoxic hearing loss, bilateral: Secondary | ICD-10-CM | POA: Insufficient documentation

## 2022-09-27 DIAGNOSIS — Z951 Presence of aortocoronary bypass graft: Secondary | ICD-10-CM | POA: Diagnosis not present

## 2022-09-27 HISTORY — PX: ENDOBRONCHIAL ULTRASOUND: SHX5096

## 2022-09-27 SURGERY — BRONCHOSCOPY, WITH BIOPSY USING ELECTROMAGNETIC NAVIGATION
Anesthesia: General | Laterality: Left

## 2022-09-27 MED ORDER — MIDAZOLAM HCL 2 MG/2ML IJ SOLN
INTRAMUSCULAR | Status: AC
  Filled 2022-09-27: qty 2

## 2022-09-27 MED ORDER — LIDOCAINE HCL (CARDIAC) PF 100 MG/5ML IV SOSY
PREFILLED_SYRINGE | INTRAVENOUS | Status: DC | PRN
  Administered 2022-09-27: 100 mg via INTRAVENOUS

## 2022-09-27 MED ORDER — ONDANSETRON HCL 4 MG/2ML IJ SOLN: 4.0000 mg | Freq: Once | INTRAMUSCULAR | Status: DC | PRN

## 2022-09-27 MED ORDER — FENTANYL CITRATE (PF) 100 MCG/2ML IJ SOLN: 25.0000 ug | INTRAMUSCULAR | Status: DC | PRN

## 2022-09-27 MED ORDER — DEXAMETHASONE SODIUM PHOSPHATE 10 MG/ML IJ SOLN
INTRAMUSCULAR | Status: DC | PRN
  Administered 2022-09-27: 10 mg via INTRAVENOUS

## 2022-09-27 MED ORDER — ROCURONIUM BROMIDE 100 MG/10ML IV SOLN
INTRAVENOUS | Status: DC | PRN
  Administered 2022-09-27: 10 mg via INTRAVENOUS
  Administered 2022-09-27: 20 mg via INTRAVENOUS
  Administered 2022-09-27: 50 mg via INTRAVENOUS

## 2022-09-27 MED ORDER — MIDAZOLAM HCL 2 MG/2ML IJ SOLN
INTRAMUSCULAR | Status: DC | PRN
  Administered 2022-09-27: 2 mg via INTRAVENOUS

## 2022-09-27 MED ORDER — SUCCINYLCHOLINE CHLORIDE 200 MG/10ML IV SOSY
PREFILLED_SYRINGE | INTRAVENOUS | Status: DC | PRN
  Administered 2022-09-27: 100 mg via INTRAVENOUS

## 2022-09-27 MED ORDER — CHLORHEXIDINE GLUCONATE 0.12 % MT SOLN
OROMUCOSAL | Status: AC
  Filled 2022-09-27: qty 15

## 2022-09-27 MED ORDER — FAMOTIDINE 20 MG PO TABS
ORAL_TABLET | ORAL | Status: AC
  Filled 2022-09-27: qty 1

## 2022-09-27 MED ORDER — SUGAMMADEX SODIUM 200 MG/2ML IV SOLN
INTRAVENOUS | Status: DC | PRN
  Administered 2022-09-27: 300 mg via INTRAVENOUS

## 2022-09-27 MED ORDER — GLYCOPYRROLATE 0.2 MG/ML IJ SOLN
INTRAMUSCULAR | Status: DC | PRN
  Administered 2022-09-27: .2 mg via INTRAVENOUS

## 2022-09-27 MED ORDER — FENTANYL CITRATE (PF) 100 MCG/2ML IJ SOLN
INTRAMUSCULAR | Status: DC | PRN
  Administered 2022-09-27 (×2): 50 ug via INTRAVENOUS

## 2022-09-27 MED ORDER — FENTANYL CITRATE (PF) 100 MCG/2ML IJ SOLN
INTRAMUSCULAR | Status: AC
  Filled 2022-09-27: qty 2

## 2022-09-27 MED ORDER — IPRATROPIUM-ALBUTEROL 0.5-2.5 (3) MG/3ML IN SOLN
RESPIRATORY_TRACT | Status: AC
  Filled 2022-09-27: qty 3

## 2022-09-27 MED ORDER — PROPOFOL 10 MG/ML IV BOLUS
INTRAVENOUS | Status: DC | PRN
  Administered 2022-09-27: 150 mg via INTRAVENOUS

## 2022-09-27 MED ORDER — ONDANSETRON HCL 4 MG/2ML IJ SOLN
INTRAMUSCULAR | Status: DC | PRN
  Administered 2022-09-27 (×2): 4 mg via INTRAVENOUS

## 2022-09-27 NOTE — Anesthesia Procedure Notes (Addendum)
Procedure Name: Intubation Date/Time: 09/27/2022 12:49 PM  Performed by: Kelton Pillar, CRNAPre-anesthesia Checklist: Patient identified, Emergency Drugs available, Suction available and Patient being monitored Patient Re-evaluated:Patient Re-evaluated prior to induction Oxygen Delivery Method: Circle system utilized Preoxygenation: Pre-oxygenation with 100% oxygen Induction Type: IV induction and Cricoid Pressure applied Ventilation: Mask ventilation without difficulty Laryngoscope Size: McGraph and 4 Grade View: Grade II Tube type: Oral Tube size: 8.5 mm Number of attempts: 1 Airway Equipment and Method: Stylet and Oral airway Placement Confirmation: ETT inserted through vocal cords under direct vision, positive ETCO2, breath sounds checked- equal and bilateral and CO2 detector Secured at: 21 cm Tube secured with: Tape Dental Injury: Teeth and Oropharynx as per pre-operative assessment  Difficulty Due To: Difficult Airway- due to dentition Future Recommendations: Recommend- induction with short-acting agent, and alternative techniques readily available Comments: Burp performed by MDA, Atraumatic intubation TFH CRNA

## 2022-09-27 NOTE — Interval H&P Note (Signed)
Omar Christian has presented today for surgery, with the diagnosis of MULTIPLE LUNG NODULES.  The various methods of treatment have been discussed with the patient and family. After consideration of risks, benefits and other options for treatment, the patient has consented to  Procedure(s): ROBOTIC ASSISTED NAVIGATIONAL BRONCHOSCOPY, ENDOBRONCHIAL ULTRASOUND-LEFT preferential as a surgical intervention.  The patient's history has been reviewed, patient examined, no change in status, stable for surgery.  I have reviewed the patient's chart and labs.  Questions were answered to the patient's satisfaction.  Benefits, limitations and potential complications of the procedure were discussed with the patient/family.  Complications from bronchoscopy are rare and most often minor, but if they occur they may include breathing difficulty, vocal cord spasm, hoarseness, slight fever, vomiting, dizziness, bronchospasm, infection, low blood oxygen, bleeding from biopsy site, or an allergic reaction to medications.  It is uncommon for patients to experience other more serious complications for example: Collapsed lung requiring chest tube placement, respiratory failure, heart attack and/or cardiac arrhythmia.  Patient agrees to proceed.  Renold Don, MD Advanced Bronchoscopy PCCM [facility]    *This note was dictated using voice recognition software/Dragon.  Despite best efforts to proofread, errors can occur which can change the meaning. Any transcriptional errors that result from this process are unintentional and may not be fully corrected at the time of dictation.

## 2022-09-27 NOTE — Transfer of Care (Signed)
Immediate Anesthesia Transfer of Care Note  Patient: Omar Christian  Procedure(s) Performed: ROBOTIC ASSISTED NAVIGATIONAL BRONCHOSCOPY (Left) ENDOBRONCHIAL ULTRASOUND (Left)  Patient Location: PACU  Anesthesia Type:General  Level of Consciousness: awake, drowsy, and patient cooperative  Airway & Oxygen Therapy: Patient Spontanous Breathing and Patient connected to face mask oxygen  Post-op Assessment: Report given to RN and Post -op Vital signs reviewed and stable  Post vital signs: Reviewed and stable  Last Vitals:  Vitals Value Taken Time  BP 151/83 09/27/22 1442  Temp    Pulse 64 09/27/22 1445  Resp 0 09/27/22 1445  SpO2 100 % 09/27/22 1445  Vitals shown include unvalidated device data.  Last Pain:  Vitals:   09/27/22 1108  TempSrc: Temporal  PainSc: 0-No pain      Patients Stated Pain Goal: 0 ([phone])  Complications: No notable events documented.

## 2022-09-27 NOTE — Op Note (Signed)
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  Preoperative Diagnosis: Multiple lung nodules, rule out cancer recurrence Post Procedure Diagnosis: Same as above Consent: Verbal/Written: obtained  Benefits, limitations and potential complications of the procedure were discussed with the patient/family.  Complications from bronchoscopy are rare and most often minor, but if they occur they may include breathing difficulty, vocal cord spasm, hoarseness, slight fever, vomiting, dizziness, bronchospasm, infection, low blood oxygen, bleeding from biopsy site, or an allergic reaction to medications.  It is uncommon for patients to experience other more serious complications for example: Collapsed lung requiring chest tube placement, respiratory failure, heart attack and/or cardiac arrhythmia.  Patient understood the potential complications and agreed to proceed.  Surgeon: Renold Don, MD Assistant/Scrub: Liborio Nixon, RRT Circulator: N/A Anesthesiologist/CRNA: Martha Clan, MD/Tawana Lowella Curb, CRNA, Floyce Stakes, CRNA Cytotechnology: Maryan Puls Fluoroscopy technician: Hulda Humphrey, RT Representatives: Shon Millet, Auris Monarch ([employer])  Type of Anesthesia: General endotracheal  Procedures Performed:   Robotic bronchoscopy: Procedure consists of robotic navigation comprised of electromagnetics, optical pattern recognition and robotic kinematic data - to triangulate bronchoscope location during the procedure and provide accurate positional data to biopsy a lesion. Cellvizio probe based confocal laser endomicroscopy (pCLE) utilizing blue laser endomicroscopy. Fluoroscopy with GE 3D C arm  Description of Procedure:  Robotic  bronchoscopy: The patient was brought to Procedure Room 2 (Bronchoscopy Suite) in the OR area where appropriate timeout was taken with the staff after the patient was inducted under general anesthesia.  The patient was inducted under general anesthesia and intubated by the anesthesia team.  Patient was intubated with a 8.5 ET tube without difficulty.  Tube was secured at 4 cm above the carina.  A Portex adapter was placed on the ET tube flange.  Once the patient was under adequate general anesthesia the Olympus therapeutic video bronchoscope was advanced and an anatomic airway tour and surveillance bronchoscopy was performed.The distal trachea appeared unremarkable. The main carina was sharp.  No secretions were seen in either right or left mainstem bronchi. The RUL, RLL, RML appeared to be free of endobronchial masses, lesions, or purulent secretions. The LUL orifice had some mucosal abnormality noted but no distinct mass.  The mucosal abnormality appeared platelike.  It was friable.  The LLL appeared to be free of endobronchial masses, lesions, or purulent secretions. Once this was completed, the robotic bronchoscope ET tube adapter was placed and ETT was cut to proper length and secured on the mid plane.  The [facility] robotic scope was then advanced through the ETT and registration was performed successfully.  There was good correlation between the robotic mapping and bronchoscopic mapping. With the assistance of fused navigation, the bronchoscope was advanced to the LLL nodule/mass. The tip of the working channel sat within 8 mm of the nodule.  However, positioning could not be satisfactorily confirmed with fluoroscopy.  A single brush pass was done in this area but because of poor visibility of the lesion under fluoroscopy it was elected to opt for target 2 which was on the LUL enthesis 8 mm nodule) at this point Cellvizio probe based confocal laser endomicroscopy (pCLE) was utilized to confirm target  acquisition.  The images through [facility] were consistent with target acquisition.  Fluoroscopy was utilized to optimize the position most favorable for biopsies, then the robotic bronchoscope was anchored to  maintain position.  A total of 8 transbronchial biopsies were obtained at this point, ROSE was nondiagnostic.  After confirmation of excellent hemostasis, 5 passes with a cytology brush on the LUL nodule/mass were performed, the brushes were cut into CytoLyt preservative.  ROSE showed inflammation.  A BAL was also obtained at this segment, which sent for cytology analysis.  For BAL sample acquisition purposes, 30 ml of normal saline were instilled, and approximately 6 ml were recovered/trapped and sent for analysis.   The robotic bronchoscope was then retracted all the way out after confirmation of excellent hemostasis.  At this point the Olympus video bronchoscope was then introduced through the Portex adapter into the existing ET tube.  The area of potential platelike abnormality on the left upper lobe was examined again.  At this point endobronchial biopsies of this area were performed x 6.  ROSE of this area showed cellular atypia.  Once this was completed the patient received bronchial lavage with 9 mL of 1% lidocaine via the ET tube. The patient tolerated the procedure well. No significant bleeding was observed at the conclusion of the procedure.  At this point, the patient was allowed to emerge from general anesthesia, and was extubated in the procedure room without incident.  The patient  was taken to the PACU in satisfactory condition.  Auscultation of the lungs showed no change from pre bronchoscopy examination.  Patient tolerated the procedure very well with no untoward effects of anesthesia noted.   Specimens Obtained:  Transbronchial Forceps Biopsy: X 8, LUL  Transbronchial Brush: X 5, LUL; x 1 LLL  Targeted BAL: 6 mL, LUL  Endobronchial biopsies: X 6, LUL  Fluoroscopy: Augmented  fluoroscopy (Body Vision) was utilized during the course of this procedure to assure that biopsies were taken in a safe manner under fluoroscopic guidance with spot films required.  Total fluoroscopy time: Total fluoroscopy time 13 minutes and 15 seconds.  Total dose 241 mGy.  Intraoperative image:                Complications:None, no pneumothorax on post film:    Estimated Blood Loss: Nil    Assessment and Plan/Additional Comments: Multiple lung nodules, query tonsillar squamous cell cancer recurrence Await pathology reports     C. Derrill Kay, MD Advanced Bronchoscopy PCCM [facility]    *This note was dictated using voice recognition software/Dragon.  Despite best efforts to proofread, errors can occur which can change the meaning.  Any change was purely unintentional.

## 2022-09-27 NOTE — Discharge Instructions (Signed)

## 2022-09-27 NOTE — Anesthesia Preprocedure Evaluation (Signed)
Anesthesia Evaluation  Patient identified by MRN, date of birth, ID band Patient awake    Reviewed: Allergy & Precautions, H&P , NPO status , Patient's Chart, lab work & pertinent test results, reviewed documented beta blocker date and time   History of Anesthesia Complications Negative for: history of anesthetic complications  Airway Mallampati: III  TM Distance: >3 FB Neck ROM: full    Dental  (+) Dental Advidsory Given, Missing, Teeth Intact   Pulmonary neg pulmonary ROS   Pulmonary exam normal breath sounds clear to auscultation       Cardiovascular Exercise Tolerance: Good hypertension, (-) angina + CAD and + CABG  (-) Past MI Normal cardiovascular exam(-) dysrhythmias (-) Valvular Problems/Murmurs Rhythm:regular Rate:Normal     Neuro/Psych negative neurological ROS  negative psych ROS   GI/Hepatic Neg liver ROS,GERD  ,,  Endo/Other  negative endocrine ROS    Renal/GU negative Renal ROS  negative genitourinary   Musculoskeletal   Abdominal   Peds  Hematology negative hematology ROS (+)   Anesthesia Other Findings Past Medical History: 09/30/2015: Abnormal findings on cardiac catheterization     Comment:  [facility] 09/24/2015: Abnormal myocardial perfusion study     Comment:  [place] [place] 09/30/2015: CAD, multiple vessel     Comment:  per CATH @ [email or web address]/DR.[email or web address] No date: Cancer [facility]) No date: Coronary artery disease No date: GERD (gastroesophageal reflux disease) No date: Gout 09/24/2015: H/O echocardiogram     Comment:  [facility] No date: Hard of hearing No date: History of pneumonia No date: Hyperlipidemia No date: Hypertension No date: Pneumonia No date: Progressive angina ([place]) No date: Tonsil cancer ([place])     Comment:  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   Reproductive/Obstetrics negative OB ROS                             Anesthesia Physical Anesthesia  Plan  ASA: 3  Anesthesia Plan: General   Post-op Pain Management:    Induction: Intravenous  PONV Risk Score and Plan: Ondansetron, Dexamethasone, Treatment may vary due to age or medical condition and Midazolam  Airway Management Planned: Oral ETT  Additional Equipment:   Intra-op Plan:   Post-operative Plan: Extubation in OR  Informed Consent: I have reviewed the patients History and Physical, chart, labs and discussed the procedure including the risks, benefits and alternatives for the proposed anesthesia with the patient or authorized representative who has indicated his/her understanding and acceptance.     Dental Advisory Given  Plan Discussed with: Anesthesiologist, CRNA and Surgeon  Anesthesia Plan Comments:        Anesthesia Quick Evaluation

## 2022-09-27 NOTE — Anesthesia Postprocedure Evaluation (Signed)
Anesthesia Post Note  Patient: Omar Christian  Procedure(s) Performed: ROBOTIC ASSISTED NAVIGATIONAL BRONCHOSCOPY (Left) ENDOBRONCHIAL ULTRASOUND (Left)  Patient location during evaluation: PACU Anesthesia Type: General Level of consciousness: awake and alert Pain management: pain level controlled Vital Signs Assessment: post-procedure vital signs reviewed and stable Respiratory status: spontaneous breathing, nonlabored ventilation and respiratory function stable Cardiovascular status: blood pressure returned to baseline and stable Postop Assessment: no apparent nausea or vomiting Anesthetic complications: no   No notable events documented.   Last Vitals:  Vitals:   09/27/22 1500 09/27/22 1516  BP: (!) 147/80 (!) 167/89  Pulse: 70 70  Resp: 14 16  Temp:  (!) 36.3 C  SpO2: 98% 97%    Last Pain:  Vitals:   09/27/22 1516  TempSrc: Temporal  PainSc: 0-No pain                 [place] Ouch

## 2022-09-28 ENCOUNTER — Encounter: Payer: Self-pay | Admitting: Pulmonary Disease

## 2022-09-28 LAB — SURGICAL PATHOLOGY

## 2022-09-28 LAB — CYTOLOGY - NON PAP

## 2022-10-02 NOTE — Progress Notes (Signed)
At the time of the note, the tonsil carcinoma is clinically not determined.

## 2022-10-04 ENCOUNTER — Telehealth: Payer: Self-pay | Admitting: Physician Assistant

## 2022-10-04 NOTE — Telephone Encounter (Signed)
Contacted [name] to schedule their annual wellness visit. Appointment made for 10/10/2022.  [place] Direct Dial: [phone]

## 2022-10-09 ENCOUNTER — Telehealth: Payer: Self-pay

## 2022-10-09 NOTE — Telephone Encounter (Signed)
Spoke with wife advised visit will be over the phone and her husband does not have to come into the office. She verbalized understanding

## 2022-10-09 NOTE — Telephone Encounter (Signed)
Copied from CRM [id number]. Topic: Medicare AWV >> Oct 09, 2022 10:47 AM Macon Large wrote: Reason for CRM: Pt wife Eber Jones stated that she spoke with someone last week and she was told that the conversation would take the place of the appt for 10/10/22 but it is still listed. Pt wife requests call back to avise. Cb#  [phone]

## 2022-10-10 ENCOUNTER — Ambulatory Visit (INDEPENDENT_AMBULATORY_CARE_PROVIDER_SITE_OTHER): Payer: Medicare HMO

## 2022-10-10 VITALS — Ht 74.0 in | Wt 231.0 lb

## 2022-10-10 DIAGNOSIS — Z0000 Encounter for general adult medical examination without abnormal findings: Secondary | ICD-10-CM

## 2022-10-10 NOTE — Progress Notes (Signed)
I connected with Lehman Promarolyn Bazzi/wife and Omar Jubileeonald L Vincelette on 10/10/22 by a audio enabled telemedicine application and verified that I am speaking with the correct person using two identifiers.  Patient Location: Home  Provider Location: Office/Clinic  I discussed the limitations of evaluation and management by telemedicine. The patient expressed understanding and agreed to proceed.  Subjective:   Omar Christian is a 67 y.o. male who presents for Medicare Annual/Subsequent preventive examination.  Review of Systems    Cardiac Risk Factors include: advanced age (>5355men, 64>65 women);dyslipidemia;hypertension;male gender    Objective:    Today's Vitals   10/10/22 0951  Weight: 231 lb (104.8 kg)  Height: 6\' 2"  (1.88 m)  PainSc: 0-No pain   Body mass index is 29.66 kg/m.     10/10/2022   10:04 AM 09/27/2022   11:03 AM 09/21/2022    2:34 PM 09/11/2022   10:45 AM 08/24/2022   10:58 AM 06/24/2022    2:18 PM 10/12/2020   10:12 AM  Advanced Directives  Does Patient Have a Medical Advance Directive? No No No No No No No  Would patient like information on creating a medical advance directive?  No - Patient declined  No - Patient declined No - Patient declined  No - Patient declined    Current Medications (verified) No outpatient encounter medications on file as of 10/10/2022.   No facility-administered encounter medications on file as of 10/10/2022.    Allergies (verified) Patient has no known allergies.   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   Past Surgical History:  Procedure Laterality Date   CARDIAC CATHETERIZATION N/A 09/30/2015   Procedure: Left Heart Cath and Coronary Angiography;  Surgeon: Marcina MillardAlexander Paraschos, MD;  Location: [facility] INVASIVE CV LAB;  Service: Cardiovascular;  Laterality: N/A;   CORONARY ARTERY BYPASS GRAFT N/A 10/08/2015   Procedure: CORONARY ARTERY BYPASS GRAFTING time 4 using left internal mammary and right greater saphenous vein harvested by endovein;  Surgeon: Delight OvensEdward B Gerhardt, MD;  [facility];  Service: Open Heart Surgery;  Laterality: N/A;   CORONARY ARTERY BYPASS GRAFT     ENDOBRONCHIAL ULTRASOUND Left 09/27/2022   Procedure: ENDOBRONCHIAL ULTRASOUND;  Surgeon: Salena SanerGonzalez, Carmen L, MD;  Location: [facility] ORS;  Service: Pulmonary;  Laterality: Left;   IR GASTROSTOMY TUBE MOD SED  02/10/2020   IR GASTROSTOMY TUBE REMOVAL  10/12/2020   IR IMAGING GUIDED PORT INSERTION  02/10/2020   IR REMOVAL TUN ACCESS W/ PORT W/O FL MOD SED  10/12/2020   MULTIPLE EXTRACTIONS WITH ALVEOLOPLASTY N/A 02/04/2020   Procedure: Extraction of tooth #'s [phone],5,12,13,31, and 32 with alveoloplasty and gross debridement of remaining teeth.;  Surgeon: Charlynne PanderKulinski, Bilbo F, DDS;  Location: [facility] OR;  Service: Oral Surgery;  Laterality: N/A;   TEE WITHOUT CARDIOVERSION N/A 10/08/2015   Procedure: TRANSESOPHAGEAL ECHOCARDIOGRAM (TEE);  Surgeon: Delight OvensEdward B Gerhardt, MD;  [facility];  Service: Open Heart Surgery;  Laterality: N/A;   VASECTOMY     WISDOM TOOTH EXTRACTION     Family History  Problem Relation Age of Onset   Heart disease Mother    Hypertension Mother    Alcohol abuse Father    Throat cancer Father    Muscular dystrophy Brother    Muscular dystrophy Brother    Social History   Socioeconomic History   Marital status: Married    Spouse name: Eber Jones   Number of children: 2   Years of education: Not on file   Highest education level: Not on file   Occupational History   Not on file  Tobacco Use   Smoking status: Never   Smokeless tobacco: Never  Vaping Use   Vaping Use: Never used  Substance and Sexual Activity   Alcohol use: No   Drug use: No   Sexual activity: Not Currently    Birth control/protection: Surgical    Comment: Vasectomy  Other Topics Concern   Not on file  Social History Narrative   Not on file   Social Determinants of Health   Financial Resource Strain: Low Risk  (10/10/2022)   Overall Financial Resource Strain (CARDIA)    Difficulty of Paying Living Expenses: Not very hard  Food Insecurity: No Food Insecurity (10/10/2022)   Hunger Vital Sign    Worried About Running Out of Food in the Last Year: Never true    Ran Out of Food in the Last Year: Never true  Transportation Needs: No Transportation Needs (10/10/2022)   PRAPARE - Administrator, Civil Service (Medical): No    Lack of Transportation (Non-Medical): No  Physical Activity: Insufficiently Active (10/10/2022)   Exercise Vital Sign    Days of Exercise per Week: 3 days    Minutes of Exercise per Session: 30 min  Stress: No Stress Concern Present (10/10/2022)   [employer] of Occupational Health - Occupational Stress Questionnaire    Feeling of Stress : Not at all  Social Connections: Moderately Integrated (10/10/2022)   Social Connection and Isolation Panel [NHANES]    Frequency of Communication with Friends and Family: Three times a week    Frequency of Social Gatherings with Friends and Family: Never    Attends Religious Services: More than 4 times per year    Active Member of [employer] or Organizations: No    Attends Engineer, structural: Never    Marital Status: Married    Tobacco Counseling Counseling given: Not Answered   Clinical Intake:  Pre-visit preparation completed: Yes  Pain : No/denies pain Pain Score: 0-No pain     BMI - recorded: 29.66 Nutritional Status: BMI 25 -29 Overweight Nutritional Risks:  None Diabetes: No  How often do you need to have someone help you when you read instructions, pamphlets, or other written materials from your doctor or pharmacy?: 1 - Never  Diabetic?no  Interpreter Needed?: No  Comments: lives with wife Information entered by :: B.Michi Herrmann,LPN   Activities of Daily Living    10/10/2022   10:05 AM 09/27/2022   11:09 AM  In your present state of health, do you have any difficulty performing the following activities:  Hearing? 1 1  Comment  heard of hearing  Vision? 0 0  Difficulty concentrating or making decisions? 0 0  Walking or climbing stairs? 0 0  Dressing or bathing? 0 0  Doing errands, shopping? 0   Preparing Food and eating ? N  Using the Toilet? N   In the past six months, have you accidently leaked urine? N   Do you have problems with loss of bowel control? N   Managing your Medications? N   Managing your Finances? N   Housekeeping or managing your Housekeeping? N     Patient Care Team: Alfredia Ferguson, PA-C as PCP - General (Physician Assistant) Delight Ovens, MD (Inactive) as Consulting Physician (Cardiothoracic Surgery) Marcina Millard, MD as Consulting Physician (Cardiology) Serena Croissant, MD as Consulting Physician (Hematology and Oncology) Lonie Peak, MD as Attending Physician (Radiation Oncology) [name], Lise Auer, RN as Oncology Nurse Navigator [name], Karie Georges, CCC-SLP as Speech Language Pathologist (Speech Pathology) Charlynne Pander, DDS (Inactive) as Consulting Physician (Dentistry) Jeanella Craze, Remi Deter, PT as Physical Therapist (Physical Therapy) Sallee Lange, LCSW (Inactive) as Social Worker (General Practice) Ramond Dial, Nani Ravens, LCSW (Inactive) as Social Worker Anabel Bene, RD as Dietitian (Nutrition)  Indicate any recent Medical Services you may have received from other than [facility] providers in the past year (date may be approximate).     Assessment:   This is a routine wellness  examination for Hope.  Hearing/Vision screen Hearing Screening - Comments:: Inadequate hearing: needs hearing aides;wife working on it Vision Screening - Comments:: Adequate vision;readers only Dr Clydene Pugh  Dietary issues and exercise activities discussed: Current Exercise Habits: Home exercise routine, Type of exercise: walking, Time (Minutes): 30, Frequency (Times/Week): 3, Weekly Exercise (Minutes/Week): 90, Intensity: Mild, Exercise limited by: cardiac condition(s)   Goals Addressed             This Visit's Progress    Patient's wife stated  patient would be fine if he did not have cancer and wants the best care.   On track    CARE PLAN ENTRY (see longitudinal plan of care for additional care plan information)  Current Barriers:  Care Coordination needs related to new oral cancer  in a patient with hypertension and gout. (disease states)  Nurse Case Manager Clinical Goal(s):  Over the next 45 days, patient will verbalize understanding of plan for oral cancer treatment Over the next 21 days, patient will attend all scheduled medical appointments: Radiation Oncologist MD, Medical Oncologist .  Interventions:  Inter-disciplinary care team collaboration (see longitudinal plan of care) Evaluation of current treatment plan related to cancer and patient's adherence to plan as established by provider. Provided education to patient and/or caregiver about advanced directives Provided education to patient re: [facility] Care Management services and care coordination services, how to access insurance benefits Reviewed medications with patient and discussed new medications that have been added since last patient outreach. Discussed plans with patient for ongoing care management follow up and provided patient with direct contact information for care management team Provided patient with [facility] handout educational materials related to organ donation per patient request Reviewed scheduled/upcoming  provider appointments including:    Radiation Oncologist and Medical Oncologist Advised patient's wife to continue patient to perform speech therapy exercises to increase mouth mobility  Patient Self Care Activities:  Attends all scheduled provider appointments Performs ADL's independently Patient working on maintaining hydration and increasing appetite Patient is withdrawn and does not want communicate much per wife. Patient is ambulating without difficulty  Updated  04/13/2020        Depression Screen    10/10/2022    9:59 AM 09/14/2021   10:36 AM 01/26/2020   11:10 AM 09/16/2019   10:08 AM  PHQ 2/9 Scores  PHQ - 2  Score 0 0 0 0    Fall Risk    10/10/2022    9:57 AM 02/27/2020    9:37 AM 01/26/2020    2:02 PM 09/16/2019   10:07 AM  Fall Risk   Falls in the past year? 0 0 0 0  Number falls in past yr: 0 0 0   Injury with Fall? 0 0 0   Risk for fall due to : No Fall Risks No Fall Risks Other (Comment)   Risk for fall due to: Comment   New cancer diagnosis   Follow up Education provided;Falls prevention discussed Falls evaluation completed;Education provided Falls prevention discussed;Education provided     FALL RISK PREVENTION PERTAINING TO THE HOME:  Any stairs in or around the home? No  If so, are there any without handrails? No  Home free of loose throw rugs in walkways, pet beds, electrical cords, etc? Yes  Adequate lighting in your home to reduce risk of falls? Yes   ASSISTIVE DEVICES UTILIZED TO PREVENT FALLS:  Life alert? No  Use of a cane, walker or w/c? No  Grab bars in the bathroom? No  Shower chair or bench in shower? No  Elevated toilet seat or a handicapped toilet? No  Cognitive Function:        Immunizations Immunization History  Administered Date(s) Administered   Moderna Sars-Covid-2 Vaccination 12/12/2019, 01/09/2020   PFIZER(Purple Top)SARS-COV-2 Vaccination 07/28/2020    TDAP status: Due, Education has been provided regarding the  importance of this vaccine. Advised may receive this vaccine at local pharmacy or Health Dept. Aware to provide a copy of the vaccination record if obtained from local pharmacy or Health Dept. Verbalized acceptance and understanding.  Flu Vaccine status: Declined, Education has been provided regarding the importance of this vaccine but patient still declined. Advised may receive this vaccine at local pharmacy or Health Dept. Aware to provide a copy of the vaccination record if obtained from local pharmacy or Health Dept. Verbalized acceptance and understanding.  Pneumococcal vaccine status: Declined,  Education has been provided regarding the importance of this vaccine but patient still declined. Advised may receive this vaccine at local pharmacy or Health Dept. Aware to provide a copy of the vaccination record if obtained from local pharmacy or Health Dept. Verbalized acceptance and understanding.   Covid-19 vaccine status: Completed vaccines  Qualifies for Shingles Vaccine? Yes   Zostavax completed No   Shingrix Completed?: No.    Education has been provided regarding the importance of this vaccine. Patient has been advised to call insurance company to determine out of pocket expense if they have not yet received this vaccine. Advised may also receive vaccine at local pharmacy or Health Dept. Verbalized acceptance and understanding.  Screening Tests Health Maintenance  Topic Date Due   Hepatitis C Screening  Never done   DTaP/Tdap/Td (1 - Tdap) Never done   Zoster Vaccines- Shingrix (1 of 2) Never done   COLONOSCOPY (Pts 45-57yrs Insurance coverage will need to be confirmed)  Never done   Pneumonia Vaccine 36+ Years old (1 of 1 - PCV) Never done   COVID-19 Vaccine (4 - 2023-24 season) 03/03/2022   INFLUENZA VACCINE  02/01/2023   HPV VACCINES  Aged Out    Health Maintenance  Health Maintenance Due  Topic Date Due   Hepatitis C Screening  Never done   DTaP/Tdap/Td (1 - Tdap) Never done    Zoster Vaccines- Shingrix (1 of 2) Never done   COLONOSCOPY (Pts 45-1yrs Insurance  coverage will need to be confirmed)  Never done   Pneumonia Vaccine 26+ Years old (1 of 1 - PCV) Never done   COVID-19 Vaccine (4 - 2023-24 season) 03/03/2022    Colorectal cancer screening: No longer required.  PT DECLINES  Lung Cancer Screening: (Low Dose CT Chest recommended if Age 57-80 years, 30 pack-year currently smoking OR have quit w/in 15years.) does not qualify.   Lung Cancer Screening Referral: no  Additional Screening:  Hepatitis C Screening: does qualify; Completed NO  Vision Screening: Recommended annual ophthalmology exams for early detection of glaucoma and other disorders of the eye. Is the patient up to date with their annual eye exam?  NO Who is the provider or what is the name of the office in which the patient attends annual eye exams? Dr Clydene Pugh If pt is not established with a provider, would they like to be referred to a provider to establish care? No .   Dental Screening: Recommended annual dental exams for proper oral hygiene  Community Resource Referral / Chronic Care Management: CRR required this visit?  No   CCM required this visit?  No    Plan:     I have personally reviewed and noted the following in the patient's chart:   Medical and social history Use of alcohol, tobacco or illicit drugs  Current medications and supplements including opioid prescriptions. Patient is not currently taking opioid prescriptions. Functional ability and status Nutritional status Physical activity Advanced directives List of other physicians Hospitalizations, surgeries, and ER visits in previous 12 months Vitals Screenings to include cognitive, depression, and falls Referrals and appointments  In addition, I have reviewed and discussed with patient certain preventive protocols, quality metrics, and best practice recommendations. A written personalized care plan for preventive  services as well as general preventive health recommendations were provided to patient.     Sue Lush, LPN   [birth year]   Nurse Notes: I spoke with pt wife for the interview as pt is HOH. Pt declines hearing test/aides for ears per wife. Pt left had to leave:visit completed with his wife, Eber Jones. She states pt is doing good. She is interested in exercise programs for him. I encouraged her to call the customer service number on the back of her card to see what they cover. Most of the [employer]vant[employer] programs pay for memberships for exercise. Wife wants to consider doing cologuard this year. She will talk to pt and PCP about this. Pt generally does none of the preventive care measures, vaccines,etc

## 2022-10-10 NOTE — Patient Instructions (Signed)
Omar Christian , Thank you for taking time to come for your Medicare Wellness Visit. I appreciate your ongoing commitment to your health goals. Please review the following plan we discussed and let me know if I can assist you in the future.   These are the goals we discussed:  Goals      Patient's wife stated  patient would be fine if he did not have cancer and wants the best care.     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 (disease states)  Nurse Case Manager Clinical Goal(s):  Over the next 45 days, patient will verbalize understanding of plan for oral cancer treatment Over the next 21 days, patient will attend all scheduled medical appointments: Radiation Oncologist MD, Medical Oncologist .  Interventions:  Inter-disciplinary care team collaboration (see longitudinal plan of care) Evaluation of current treatment plan related to cancer and patient's adherence to plan as established by provider. Provided education to patient and/or caregiver about advanced directives Provided education to patient re: [facility] Care Management services and care coordination services, how to access insurance benefits Reviewed medications with patient and discussed new medications that have been added since last patient outreach. Discussed plans with patient for ongoing care management follow up and provided patient with direct contact information for care management team Provided patient with [facility] handout educational materials related to organ donation per patient request Reviewed scheduled/upcoming provider appointments including:    Radiation Oncologist and Medical Oncologist Advised patient's wife to continue patient to perform speech therapy exercises to increase mouth mobility  Patient Self Care Activities:  Attends all scheduled provider  appointments Performs ADL's independently Patient working on maintaining hydration and increasing appetite Patient is withdrawn and does not want communicate much per wife. Patient is ambulating without difficulty  Updated  04/13/2020         This is a list of the screening recommended for you and due dates:  Health Maintenance  Topic Date Due   Hepatitis C Screening: USPSTF Recommendation to screen - Ages 4-79 yo.  Never done   DTaP/Tdap/Td vaccine (1 - Tdap) Never done   Zoster (Shingles) Vaccine (1 of 2) Never done   Colon Cancer Screening  Never done   Pneumonia Vaccine (1 of 1 - PCV) Never done   COVID-19 Vaccine (4 - 2023-24 season) 03/03/2022   Flu Shot  02/01/2023   HPV Vaccine  Aged Out    Advanced directives: no  Conditions/risks identified: none  Next appointment: Follow up in one year for your annual wellness visit. 10/15/2023 @9 :45am telephone  Preventive Care 65 Years and Older, Male  Preventive care refers to lifestyle choices and visits with your health care provider that can promote health and wellness. What does preventive care include? A yearly physical exam. This is also called an annual well check. Dental exams once or twice a year. Routine eye exams. Ask your health care provider how often you should have your eyes checked. Personal lifestyle choices, including: Daily care of your teeth and gums. Regular physical activity. Eating a healthy diet. Avoiding tobacco and drug use. Limiting alcohol use. Practicing safe sex. Taking low doses of aspirin every day. Taking vitamin and mineral supplements as recommended by your health care provider. What happens during an annual well check? The services and screenings done by your health care provider during your annual  well check will depend on your age, overall health, lifestyle risk factors, and family history of disease. Counseling  Your health care provider may ask you questions about your: Alcohol  use. Tobacco use. Drug use. Emotional well-being. Home and relationship well-being. Sexual activity. Eating habits. History of falls. Memory and ability to understand (cognition). Work and work Astronomer. Screening  You may have the following tests or measurements: Height, weight, and BMI. Blood pressure. Lipid and cholesterol levels. These may be checked every 5 years, or more frequently if you are over 88 years old. Skin check. Lung cancer screening. You may have this screening every year starting at age 56 if you have a 30-pack-year history of smoking and currently smoke or have quit within the past 15 years. Fecal occult blood test (FOBT) of the stool. You may have this test every year starting at age 69. Flexible sigmoidoscopy or colonoscopy. You may have a sigmoidoscopy every 5 years or a colonoscopy every 10 years starting at age 20. Prostate cancer screening. Recommendations will vary depending on your family history and other risks. Hepatitis C blood test. Hepatitis B blood test. Sexually transmitted disease (STD) testing. Diabetes screening. This is done by checking your blood sugar (glucose) after you have not eaten for a while (fasting). You may have this done every 1-3 years. Abdominal aortic aneurysm (AAA) screening. You may need this if you are a current or former smoker. Osteoporosis. You may be screened starting at age 77 if you are at high risk. Talk with your health care provider about your test results, treatment options, and if necessary, the need for more tests. Vaccines  Your health care provider may recommend certain vaccines, such as: Influenza vaccine. This is recommended every year. Tetanus, diphtheria, and acellular pertussis (Tdap, Td) vaccine. You may need a Td booster every 10 years. Zoster vaccine. You may need this after age 11. Pneumococcal 13-valent conjugate (PCV13) vaccine. One dose is recommended after age 25. Pneumococcal polysaccharide  (PPSV23) vaccine. One dose is recommended after age 91. Talk to your health care provider about which screenings and vaccines you need and how often you need them. This information is not intended to replace advice given to you by your health care provider. Make sure you discuss any questions you have with your health care provider. Document Released: 07/16/2015 Document Revised: 03/08/2016 Document Reviewed: 04/20/2015 Elsevier Interactive Patient Education  2017 [employer].  Fall Prevention in the Home Falls can cause injuries. They can happen to people of all ages. There are many things you can do to make your home safe and to help prevent falls. What can I do on the outside of my home? Regularly fix the edges of walkways and driveways and fix any cracks. Remove anything that might make you trip as you walk through a door, such as a raised step or threshold. Trim any bushes or trees on the path to your home. Use bright outdoor lighting. Clear any walking paths of anything that might make someone trip, such as rocks or tools. Regularly check to see if handrails are loose or broken. Make sure that both sides of any steps have handrails. Any raised decks and porches should have guardrails on the edges. Have any leaves, snow, or ice cleared regularly. Use sand or salt on walking paths during winter. Clean up any spills in your garage right away. This includes oil or grease spills. What can I do in the bathroom? Use night lights. Install grab bars by the toilet  and in the tub and shower. Do not use towel bars as grab bars. Use non-skid mats or decals in the tub or shower. If you need to sit down in the shower, use a plastic, non-slip stool. Keep the floor dry. Clean up any water that spills on the floor as soon as it happens. Remove soap buildup in the tub or shower regularly. Attach bath mats securely with double-sided non-slip rug tape. Do not have throw rugs and other things on the  floor that can make you trip. What can I do in the bedroom? Use night lights. Make sure that you have a light by your bed that is easy to reach. Do not use any sheets or blankets that are too big for your bed. They should not hang down onto the floor. Have a firm chair that has side arms. You can use this for support while you get dressed. Do not have throw rugs and other things on the floor that can make you trip. What can I do in the kitchen? Clean up any spills right away. Avoid walking on wet floors. Keep items that you use a lot in easy-to-reach places. If you need to reach something above you, use a strong step stool that has a grab bar. Keep electrical cords out of the way. Do not use floor polish or wax that makes floors slippery. If you must use wax, use non-skid floor wax. Do not have throw rugs and other things on the floor that can make you trip. What can I do with my stairs? Do not leave any items on the stairs. Make sure that there are handrails on both sides of the stairs and use them. Fix handrails that are broken or loose. Make sure that handrails are as long as the stairways. Check any carpeting to make sure that it is firmly attached to the stairs. Fix any carpet that is loose or worn. Avoid having throw rugs at the top or bottom of the stairs. If you do have throw rugs, attach them to the floor with carpet tape. Make sure that you have a light switch at the top of the stairs and the bottom of the stairs. If you do not have them, ask someone to add them for you. What else can I do to help prevent falls? Wear shoes that: Do not have high heels. Have rubber bottoms. Are comfortable and fit you well. Are closed at the toe. Do not wear sandals. If you use a stepladder: Make sure that it is fully opened. Do not climb a closed stepladder. Make sure that both sides of the stepladder are locked into place. Ask someone to hold it for you, if possible. Clearly mark and make  sure that you can see: Any grab bars or handrails. First and last steps. Where the edge of each step is. Use tools that help you move around (mobility aids) if they are needed. These include: Canes. Walkers. Scooters. Crutches. Turn on the lights when you go into a dark area. Replace any light bulbs as soon as they burn out. Set up your furniture so you have a clear path. Avoid moving your furniture around. If any of your floors are uneven, fix them. If there are any pets around you, be aware of where they are. Review your medicines with your doctor. Some medicines can make you feel dizzy. This can increase your chance of falling. Ask your doctor what other things that you can do to help prevent falls.  This information is not intended to replace advice given to you by your health care provider. Make sure you discuss any questions you have with your health care provider. Document Released: 04/15/2009 Document Revised: 11/25/2015 Document Reviewed: 07/24/2014 Elsevier Interactive Patient Education  2017 [employer].

## 2022-10-16 ENCOUNTER — Inpatient Hospital Stay: Payer: Medicare HMO

## 2022-10-16 ENCOUNTER — Encounter: Payer: Self-pay | Admitting: Physician Assistant

## 2022-10-16 ENCOUNTER — Inpatient Hospital Stay: Payer: Medicare HMO | Admitting: Internal Medicine

## 2022-10-17 ENCOUNTER — Ambulatory Visit (INDEPENDENT_AMBULATORY_CARE_PROVIDER_SITE_OTHER): Payer: Medicare HMO | Admitting: Physician Assistant

## 2022-10-17 ENCOUNTER — Encounter: Payer: Self-pay | Admitting: Physician Assistant

## 2022-10-17 VITALS — BP 169/96 | HR 92 | Wt 234.1 lb

## 2022-10-17 DIAGNOSIS — I10 Essential (primary) hypertension: Secondary | ICD-10-CM | POA: Diagnosis not present

## 2022-10-17 DIAGNOSIS — M1A0710 Idiopathic chronic gout, right ankle and foot, without tophus (tophi): Secondary | ICD-10-CM | POA: Diagnosis not present

## 2022-10-17 MED ORDER — LOSARTAN POTASSIUM 25 MG PO TABS: 25.0000 mg | ORAL_TABLET | Freq: Every day | ORAL | 1 refills | Status: DC

## 2022-10-17 MED ORDER — COLCHICINE 0.6 MG PO TABS: ORAL_TABLET | ORAL | 0 refills | Status: DC

## 2022-10-17 NOTE — Progress Notes (Signed)
I,Sha'taria Tyson,acting as a Neurosurgeon for [employer], PA-C.,have documented all relevant documentation on the behalf of Alfredia Ferguson, PA-C,as directed by  Alfredia Ferguson, PA-C while in the presence of Alfredia Ferguson, PA-C.   Established patient visit   Patient: Omar Christian   DOB: [birth year]   67 y.o. Male  MRN: [id number] Visit Date: 10/17/2022  Today's healthcare provider: Alfredia Ferguson, PA-C   Cc. Gout flare x 1 day  Subjective    HPI  Patient reports his gout starts flaring up yesterday. Reports he is unsure why as he has been watching what he eats and he does not consume alcohol.  Gout: Patient here for evaluation of acute gouty arthritis. The patient reports onset of an acute gout attack involving the right foot beginning 1 day, and being treated with allopurinol (Zyloprim). Attacks occur primarily in the right foot. Patient reports his chronic pain is unchanged, his joint stiffness is none and his joint swelling is unchanged. Limitation on activities include difficulty with walking. The patient is avoiding high purine foods and reports consuming 0 alcoholic drinks per years.    Medications: No outpatient medications prior to visit.   No facility-administered medications prior to visit.    Review of Systems  Constitutional:  Negative for fatigue and fever.  Respiratory:  Negative for cough and shortness of breath.   Cardiovascular:  Negative for chest pain, palpitations and leg swelling.  Musculoskeletal:  Positive for arthralgias and joint swelling.  Neurological:  Negative for dizziness and headaches.      Objective    BP (!) 169/96 (BP Location: Left Arm, Patient Position: Sitting, Cuff Size: Normal)   Pulse 92   Wt 234 lb 1.6 oz (106.2 kg)   SpO2 98%   BMI 30.06 kg/m    Physical Exam Vitals reviewed.  Constitutional:      Appearance: He is not ill-appearing.  HENT:     Head: Normocephalic.  Eyes:     Conjunctiva/sclera: Conjunctivae normal.   Cardiovascular:     Rate and Rhythm: Normal rate.  Pulmonary:     Effort: Pulmonary effort is normal. No respiratory distress.  Feet:     Comments: Right foot with some erythema and minimal edema lateral metacarpals.  Tender to palpation Neurological:     General: No focal deficit present.     Mental Status: He is alert and oriented to person, place, and time.  Psychiatric:        Mood and Affect: Mood normal.        Behavior: Behavior normal.     No results found for any visits on 10/17/22.  Assessment & Plan     Problem List Items Addressed This Visit       Cardiovascular and Mediastinum   Essential (primary) hypertension    Pt states used to be medicated but then he stopped. Will start losartan 25 mg  F/u 4 weeks       Relevant Medications   losartan (COZAAR) 25 MG tablet     Musculoskeletal and Integument   Chronic gout of right foot - Primary    Per pt he used to take allopurinol Rx colchicine for current symptoms  Will get cmp/uric acid level and determine if allopurinol is necessary again  Likely 2/2 diet, had a lengthy discussion on foods to avoid      Relevant Medications   colchicine 0.6 MG tablet   Other Relevant Orders   Uric acid   Comprehensive Metabolic Panel (CMET)  Return in about 4 weeks (around 11/14/2022) for hypertension.      I, Alfredia Ferguson, PA-C have reviewed all documentation for this visit. The documentation on  10/17/22  for the exam, diagnosis, procedures, and orders are all accurate and complete.  Alfredia Ferguson, PA-C [facility] [address] [place], [place], [address] Office: [phone] Fax: [phone]   [facility]

## 2022-10-17 NOTE — Assessment & Plan Note (Signed)
Pt states used to be medicated but then he stopped. Will start losartan 25 mg  F/u 4 weeks

## 2022-10-17 NOTE — Assessment & Plan Note (Signed)
Per pt he used to take allopurinol Rx colchicine for current symptoms  Will get cmp/uric acid level and determine if allopurinol is necessary again  Likely 2/2 diet, had a lengthy discussion on foods to avoid

## 2022-10-18 LAB — COMPREHENSIVE METABOLIC PANEL
ALT: 25 IU/L (ref 0–44)
AST: 18 IU/L (ref 0–40)
Albumin/Globulin Ratio: 2.2 (ref 1.2–2.2)
Albumin: 4.4 g/dL (ref 3.9–4.9)
Alkaline Phosphatase: 83 IU/L (ref 44–121)
BUN/Creatinine Ratio: 9 — ABNORMAL LOW (ref 10–24)
BUN: 11 mg/dL (ref 8–27)
Bilirubin Total: 0.6 mg/dL (ref 0.0–1.2)
CO2: 23 mmol/L (ref 20–29)
Calcium: 9.5 mg/dL (ref 8.6–10.2)
Chloride: 98 mmol/L (ref 96–106)
Creatinine, Ser: 1.27 mg/dL (ref 0.76–1.27)
Globulin, Total: 2 g/dL (ref 1.5–4.5)
Glucose: 169 mg/dL — ABNORMAL HIGH (ref 70–99)
Potassium: 4.8 mmol/L (ref 3.5–5.2)
Sodium: 136 mmol/L (ref 134–144)
Total Protein: 6.4 g/dL (ref 6.0–8.5)
eGFR: 62 mL/min/{1.73_m2} (ref >59)

## 2022-10-18 LAB — URIC ACID: Uric Acid: 6.2 mg/dL (ref 3.8–8.4)

## 2022-10-27 ENCOUNTER — Inpatient Hospital Stay: Payer: Medicare HMO | Attending: Internal Medicine | Admitting: Internal Medicine

## 2022-10-27 ENCOUNTER — Encounter: Payer: Self-pay | Admitting: Internal Medicine

## 2022-10-27 ENCOUNTER — Inpatient Hospital Stay: Payer: Medicare HMO

## 2022-10-27 VITALS — BP 170/80 | HR 79 | Temp 97.4°F | Resp 18 | Wt 226.2 lb

## 2022-10-27 DIAGNOSIS — Z800 Family history of malignant neoplasm of digestive organs: Secondary | ICD-10-CM | POA: Diagnosis not present

## 2022-10-27 DIAGNOSIS — I2510 Atherosclerotic heart disease of native coronary artery without angina pectoris: Secondary | ICD-10-CM | POA: Insufficient documentation

## 2022-10-27 DIAGNOSIS — R918 Other nonspecific abnormal finding of lung field: Secondary | ICD-10-CM | POA: Insufficient documentation

## 2022-10-27 DIAGNOSIS — H9190 Unspecified hearing loss, unspecified ear: Secondary | ICD-10-CM | POA: Diagnosis not present

## 2022-10-27 DIAGNOSIS — C098 Malignant neoplasm of overlapping sites of tonsil: Secondary | ICD-10-CM | POA: Insufficient documentation

## 2022-10-27 DIAGNOSIS — C099 Malignant neoplasm of tonsil, unspecified: Secondary | ICD-10-CM

## 2022-10-27 NOTE — Assessment & Plan Note (Addendum)
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  However concern for recurrence-FEB 10th, 2024-  Multiple pulmonary nodules in the lungs, new compared to the prior study, concerning for metastatic disease. PET  09/01/2022-The 3 largest pulmonary nodule seen on recent chest CT are hypermetabolic, most consistent with metastatic disease.  Single small hypermetabolic lymph node in the left base of the mesentery is nonspecific. Cannot exclude metastatic disease. No evidence of local recurrence in the neck or other distant metastases in the abdomen or pelvis.  # S/p evaluation with pulmonary/biopsy-negative for malignancy. Patient continues denial of possible recurrence. However, continues to have significant concern for recurrence based on imaging. Recommend NavDx testing today. Discussed with pulmonary.   # Mild intermittent hypercalcemia PCP -vit 25-OH-28 [FEB 2024]- recommend ca VIt D.  Multiple myeloma- WNL;  panel kappa, lambda light chain ratio- 1.8; monitor for now.  # Hearing loss s/p Cisplatin-monitor for now. Stable.   # Hx of CAD- stable  # DISPOSITION: # labs- NavDx today # follow up in 2 weeks-MD; no labs- - Dr.B

## 2022-10-27 NOTE — Progress Notes (Signed)
[facility] CONSULT NOTE  Patient Care Team: Alfredia Ferguson, PA-C as PCP - General (Physician Assistant) Delight Ovens, MD (Inactive) as Consulting Physician (Cardiothoracic Surgery) Marcina Millard, MD as Consulting Physician (Cardiology) Serena Croissant, MD as Consulting Physician (Hematology and Oncology) Lonie Peak, MD as Attending Physician (Radiation Oncology) [name], Lise Auer, RN as Oncology Nurse Navigator [name], Karie Georges, CCC-SLP as Speech Language Pathologist (Speech Pathology) Charlynne Pander, DDS (Inactive) as Consulting Physician (Dentistry) Jeanella Craze, Remi Deter, PT as Physical Therapist (Physical Therapy) Sallee Lange, LCSW (Inactive) as Social Worker (General Practice) Elmore, Abigail P, LCSW (Inactive) as Social Worker Anabel Bene, RD as Dietitian (Nutrition)  CHIEF COMPLAINTS/PURPOSE OF CONSULTATION: tonsil cancer  Oncology History Overview Note  # SEP 2021- HPV positive- stage III/IV- cisplatin-RT [Dr.Gudena/Squire]-  # FEB 2024- Multiple pulmonary nodules are noted in the lungs bilaterally, new compared to the prior study, largest of which is in the posteromedial aspect of the superior segment of the left lower lobe (axial image 70 of series 4) measuring 1.7 x 1.3 cm. Other prominent nodules include a 10 x 9 mm nodule in the posteromedial right lower lobe (axial image 70 of series 4), and a 8 mm nodule in the left upper lobe (axial image 44 of series 4).     Tonsillar cancer (HCC)  01/14/2020 Initial Diagnosis   Squamous cell carcinoma with basaloid features   01/26/2020 PET scan   Right tonsillar mass extending to bilateral tongue base 4.9 cm.  Two right cervical lymph nodes metastases measuring 2.5 cm and 2.1 cm   02/17/2020 - 03/23/2020 Chemotherapy   Patient is on Treatment Plan : HEAD/NECK Cisplatin + XRT q21d     02/17/2020 Cancer Staging   Staging form: Pharynx - P16 Negative Oropharynx, AJCC 8th Edition -  Clinical stage from 02/17/2020: Stage IVA (cT4a, cN2b, cM0, p16+) - Signed by Serena Croissant, MD on 03/02/2020   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HCC)  01/27/2020 Cancer Staging   Staging form: Pharynx - HPV-Mediated Oropharynx, AJCC 8th Edition - Clinical stage from 01/27/2020: Stage III (cT4, cN1, cM0, p16+) - Signed by Lonie Peak, MD on 01/31/2020   01/31/2020 Initial Diagnosis   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HCC)     HISTORY OF PRESENTING ILLNESS: Patient is hard of hearing.  Accompanied by his wife.   Ella Jubilee 67 y.o.  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  Patient underwent evaluation with pulmonary s/p bronchoscopy.  He missed his last appt due to gout outbreak on the side of his right foot. Pain has improved, he is on a medication for the gout now. No symptoms from lung nodule.   He has residual swallowing difficulties, bad hearing and bad teeth from chemo/radiation. Appetite is great. Energy is good.   Denies any nausea vomiting abdominal pain. ..  Review of Systems  Constitutional:  Negative for chills, diaphoresis, fever, malaise/fatigue and weight loss.  HENT:  Negative for nosebleeds and sore throat.   Eyes:  Negative for double vision.  Respiratory:  Negative for cough, hemoptysis, sputum production, shortness of breath and wheezing.   Cardiovascular:  Negative for chest pain, palpitations, orthopnea and leg swelling.  Gastrointestinal:  Negative for abdominal pain, blood in stool, constipation, diarrhea, heartburn, melena, nausea and vomiting.  Genitourinary:  Negative for dysuria, frequency and urgency.  Musculoskeletal:  Negative for back  pain and joint pain.  Skin: Negative.  Negative for itching and rash.  Neurological:  Negative for dizziness, tingling, focal weakness, weakness and headaches.  Endo/Heme/Allergies:  Does not  bruise/bleed easily.  Psychiatric/Behavioral:  Negative for depression. The patient is not nervous/anxious and does not have insomnia.     MEDICAL HISTORY:  Past Medical History:  Diagnosis Date   Abnormal findings on cardiac catheterization 09/30/2015   [facility]   Abnormal myocardial perfusion study 09/24/2015   [facility]   CAD, multiple vessel 09/30/2015   per CATH @ [facility]/DR.PARACHOS   Cancer (HCC)    Coronary artery disease    GERD (gastroesophageal reflux disease)    Gout    H/O echocardiogram 09/24/2015   [facility]   Hard of hearing    History of pneumonia    Hyperlipidemia    Hypertension    Pneumonia    Progressive angina (HCC)    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    SURGICAL HISTORY: Past Surgical History:  Procedure Laterality Date   CARDIAC CATHETERIZATION N/A 09/30/2015   Procedure: Left Heart Cath and Coronary Angiography;  Surgeon: Marcina Millard, MD;  Location: [facility] INVASIVE CV LAB;  Service: Cardiovascular;  Laterality: N/A;   CORONARY ARTERY BYPASS GRAFT N/A 10/08/2015   Procedure: CORONARY ARTERY BYPASS GRAFTING time 4 using left internal mammary and right greater saphenous vein harvested by endovein;  Surgeon: Delight Ovens, MD;  Location: [facility] OR;  Service: Open Heart Surgery;  Laterality: N/A;   CORONARY ARTERY BYPASS GRAFT     ENDOBRONCHIAL ULTRASOUND Left 09/27/2022   Procedure: ENDOBRONCHIAL ULTRASOUND;  Surgeon: Salena Saner, MD;  Location: [facility] ORS;  Service: Pulmonary;  Laterality: Left;   IR GASTROSTOMY TUBE MOD SED  02/10/2020   IR GASTROSTOMY TUBE REMOVAL  10/12/2020   IR IMAGING GUIDED PORT INSERTION  02/10/2020   IR REMOVAL TUN ACCESS W/ PORT W/O FL MOD SED  10/12/2020   MULTIPLE EXTRACTIONS WITH ALVEOLOPLASTY N/A 02/04/2020   Procedure: Extraction of tooth #'s [phone], and 32 with alveoloplasty and gross debridement of remaining teeth.;  Surgeon: Charlynne Pander, DDS;  Location: [facility] OR;   Service: Oral Surgery;  Laterality: N/A;   TEE WITHOUT CARDIOVERSION N/A 10/08/2015   Procedure: TRANSESOPHAGEAL ECHOCARDIOGRAM (TEE);  Surgeon: Delight Ovens, MD;  Location: [facility] OR;  Service: Open Heart Surgery;  Laterality: N/A;   VASECTOMY     WISDOM TOOTH EXTRACTION      SOCIAL HISTORY: Social History   Socioeconomic History   Marital status: Married    Spouse name: Eber Jones   Number of children: 2   Years of education: Not on file   Highest education level: Not on file  Occupational History   Not on file  Tobacco Use   Smoking status: Never   Smokeless tobacco: Never  Vaping Use   Vaping Use: Never used  Substance and Sexual Activity   Alcohol use: No   Drug use: No   Sexual activity: Not Currently    Birth control/protection: Surgical    Comment: Vasectomy  Other Topics Concern   Not on file  Social History Narrative   Not on file   Social Determinants of Health   Financial Resource Strain: Low Risk  (10/10/2022)   Overall Financial Resource Strain (CARDIA)    Difficulty of Paying Living Expenses: Not very hard  Food Insecurity: No Food Insecurity (10/10/2022)   Hunger Vital Sign    Worried About Running  Out of Food in the Last Year: Never true    Ran Out of Food in the Last Year: Never true  Transportation Needs: No Transportation Needs (10/10/2022)   PRAPARE - Administrator, Civil Service (Medical): No    Lack of Transportation (Non-Medical): No  Physical Activity: Insufficiently Active (10/10/2022)   Exercise Vital Sign    Days of Exercise per Week: 3 days    Minutes of Exercise per Session: 30 min  Stress: No Stress Concern Present (10/10/2022)   [employer] of Occupational Health - Occupational Stress Questionnaire    Feeling of Stress : Not at all  Social Connections: Moderately Integrated (10/10/2022)   Social Connection and Isolation Panel [NHANES]    Frequency of Communication with Friends and Family: Three times a week    Frequency of  Social Gatherings with Friends and Family: Never    Attends Religious Services: More than 4 times per year    Active Member of [employer] or Organizations: No    Attends Banker Meetings: Never    Marital Status: Married  Catering manager Violence: Not At Risk (10/10/2022)   Humiliation, Afraid, Rape, and Kick questionnaire    Fear of Current or Ex-Partner: No    Emotionally Abused: No    Physically Abused: No    Sexually Abused: No    FAMILY HISTORY: Family History  Problem Relation Age of Onset   Heart disease Mother    Hypertension Mother    Alcohol abuse Father    Throat cancer Father    Muscular dystrophy Brother    Muscular dystrophy Brother     ALLERGIES:  has No Known Allergies.  MEDICATIONS:  Current Outpatient Medications  Medication Sig Dispense Refill   colchicine 0.6 MG tablet Day 1: 1.6 mg ( 2 tablets) for the first dose, and then 0.6 mg ( 1 tablet) in 1 hour. After day 1, take 0.6 mg twice daily for three days or until symptoms resolve 20 tablet 0   losartan (COZAAR) 25 MG tablet Take 1 tablet (25 mg total) by mouth daily. 90 tablet 1   No current facility-administered medications for this visit.    PHYSICAL EXAMINATION:   Vitals:   10/27/22 1042  BP: (!) 170/80  Pulse: 79  Resp: 18  Temp: (!) 97.4 F (36.3 C)  SpO2: 98%   Filed Weights   10/27/22 1042  Weight: 226 lb 3.2 oz (102.6 kg)    Physical Exam Vitals and nursing note reviewed.  HENT:     Head: Normocephalic and atraumatic.     Mouth/Throat:     Pharynx: Oropharynx is clear.  Eyes:     Extraocular Movements: Extraocular movements intact.     Pupils: Pupils are equal, round, and reactive to light.  Cardiovascular:     Rate and Rhythm: Normal rate and regular rhythm.  Pulmonary:     Comments: Decreased breath sounds bilaterally.  Abdominal:     Palpations: Abdomen is soft.  Musculoskeletal:        General: Normal range of motion.     Cervical back: Normal range of motion.   Skin:    General: Skin is warm.  Neurological:     General: No focal deficit present.     Mental Status: He is alert and oriented to person, place, and time.  Psychiatric:        Behavior: Behavior normal.        Judgment: Judgment normal.     LABORATORY DATA:  I have reviewed the data as listed Lab Results  Component Value Date   WBC 4.3 08/24/2022   HGB 14.6 08/24/2022   HCT 42.8 08/24/2022   MCV 90.1 08/24/2022   PLT 144 (L) 08/24/2022   Recent Labs    08/15/22 1012 08/24/22 1149 10/17/22 1344  NA 140 136 136  K 4.7 4.0 4.8  CL 100 104 98  CO2 23 24 23   GLUCOSE 127* 116* 169*  BUN 16 13 11   CREATININE 1.22 1.14 1.27  CALCIUM 10.7* 9.3 9.5  GFRNONAA  --  >60  --   PROT 6.9 7.1 6.4  ALBUMIN 4.7 4.2 4.4  AST 22 28 18   ALT 28 30 25   ALKPHOS 76 64 83  BILITOT 0.7 0.8 0.6    RADIOGRAPHIC STUDIES: I have personally reviewed the radiological images as listed and agreed with the findings in the report. DG Chest Port 1 View  Result Date: 09/27/2022 CLINICAL DATA:  Post bronchoscopy with biopsy EXAM: PORTABLE CHEST 1 VIEW COMPARISON:  Portable exam 1453 hours compared to 07/20/2022 FINDINGS: Enlargement of cardiac silhouette post CABG. Mediastinal contours and pulmonary vascularity normal. Small BILATERAL pulmonary nodules. No pulmonary infiltrate, pleural effusion, or pneumothorax. IMPRESSION: Known pulmonary nodules. Enlargement of cardiac silhouette post CABG. No acute abnormalities. Electronically Signed   By: Ulyses Southward M.D.   On: 09/27/2022 15:04   DG C-Arm 1-60 Min-No Report  Result Date: 09/27/2022 Fluoroscopy was utilized by the requesting physician.  No radiographic interpretation.   DG C-Arm 1-60 Min-No Report  Result Date: 09/27/2022 Fluoroscopy was utilized by the requesting physician.  No radiographic interpretation.   DG C-Arm 1-60 Min-No Report  Result Date: 09/27/2022 Fluoroscopy was utilized by the requesting physician.  No radiographic  interpretation.     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  However concern for recurrence-FEB 10th, 2024-  Multiple pulmonary nodules in the lungs, new compared to the prior study, concerning for metastatic disease. PET  09/01/2022-The 3 largest pulmonary nodule seen on recent chest CT are hypermetabolic, most consistent with metastatic disease.  Single small hypermetabolic lymph node in the left base of the mesentery is nonspecific. Cannot exclude metastatic disease. No evidence of local recurrence in the neck or other distant metastases in the abdomen or pelvis.  # S/p evaluation with pulmonary/biopsy-negative for malignancy. Patient continues denial of possible recurrence. However, continues to have significant concern for recurrence based on imaging. Recommend NavDx testing today. Discussed with pulmonary.   # Mild intermittent hypercalcemia PCP -vit 25-OH-28 [FEB 2024]- recommend ca VIt D.  Multiple myeloma- WNL;  panel kappa, lambda light chain ratio- 1.8; monitor for now.  # Hearing loss s/p Cisplatin-monitor for now. Stable.   # Hx of CAD- stable  # DISPOSITION: # labs- NavDx today # follow up in 2 weeks-MD; no labs- - Dr.B  Above plan of care was discussed with patient/family in detail.  My contact information was given to the patient/family.     Earna Coder, MD 10/27/2022 11:12 AM

## 2022-10-27 NOTE — Progress Notes (Signed)
Pt doing well. He missed his last appt due to gout outbreak on the side of his right foot. Pain has improved, he is on a medication for the gout now. No symptoms from lung nodule. He has residual swallowing difficulties, bad hearing and bad teeth from chemo/radiation. Appetite is great. Energy is good. He was also started on a BP medication.

## 2022-10-31 ENCOUNTER — Encounter: Payer: Self-pay | Admitting: Pulmonary Disease

## 2022-10-31 ENCOUNTER — Ambulatory Visit: Payer: Medicare HMO | Admitting: Pulmonary Disease

## 2022-10-31 VITALS — BP 160/80 | HR 69 | Temp 98.1°F | Ht 72.0 in | Wt 228.6 lb

## 2022-10-31 DIAGNOSIS — R918 Other nonspecific abnormal finding of lung field: Secondary | ICD-10-CM | POA: Diagnosis not present

## 2022-10-31 DIAGNOSIS — C099 Malignant neoplasm of tonsil, unspecified: Secondary | ICD-10-CM

## 2022-10-31 NOTE — Progress Notes (Signed)
Subjective:    Patient ID: Omar Christian, male    DOB: 08-06-1955, 67 y.o.   MRN: [id number] Patient Care Team: Alfredia Ferguson, PA-C as PCP - General (Physician Assistant) Delight Ovens, MD (Inactive) as Consulting Physician (Cardiothoracic Surgery) Marcina Millard, MD as Consulting Physician (Cardiology) Serena Croissant, MD as Consulting Physician (Hematology and Oncology) Lonie Peak, MD as Attending Physician (Radiation Oncology) [name], Lise Auer, RN as Oncology Nurse Navigator [name], Karie Georges, CCC-SLP as Speech Language Pathologist (Speech Pathology) Charlynne Pander, DDS (Inactive) as Consulting Physician (Dentistry) Jeanella Craze, Remi Deter, PT as Physical Therapist (Physical Therapy) Sallee Lange, LCSW (Inactive) as Social Worker (General Practice) Estill Batten, LCSW (Inactive) as Social Worker Anabel Bene, RD as Dietitian (Nutrition)  Chief Complaint  Patient presents with   Follow-up    No concerns.    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  He was treated with concurrent chemoradiation, completed radiation 12 April 2020 and completed chemotherapy in 23 March 2020.  At that time he had been followed by the [facility].  During evaluation for a cough and congestion the patient underwent a noncontrasted CT chest on 10 August 2022 and this showed multiple pulmonary nodules in the lungs that were new compared to prior study.  This was concerning for metastatic disease.  The patient underwent robotic assisted navigational bronchoscopy on 07 September 2022.  Unfortunately the biopsy was nondiagnostic.  After discussion with Dr. Donneta Romberg, the patient's primary oncologist, it was discussed that obtaining an Nav DX blood test would be the next best option.  The patient continues to be in denial of the potential metastatic  disease however after our discussion today he was more contemplative and appeared to understand the likely possibility that his cancer has recurred.  He also was more open to the possibility of a second biopsy if necessary.  He does not endorse any respiratory symptoms today.  Had recent issues with a gouty flare but this resolved without issue.  He did not have any untoward side effect from his robotic bronchoscopy.  Review of Systems A 10 point review of systems was performed and it is as noted above otherwise negative.  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   Unstable angina pectoris (HCC) 10/04/2015   Progressive angina (HCC)    Abnormal findings on cardiac catheterization 09/30/2015   CAD, multiple vessel 09/30/2015   Abnormal findings diagnostic imaging of heart and coronary circulation 09/30/2015   CAD in native artery 09/30/2015   Abnormal myocardial perfusion study 09/24/2015   Allergic rhinitis 08/27/2015   HLD (hyperlipidemia) 08/27/2015   Benign hypertension 08/27/2015   Acid reflux 08/27/2015   Essential (primary) hypertension 08/27/2015   Social History   Tobacco Use   Smoking status: Never  Smokeless tobacco: Never  Substance Use Topics   Alcohol use: No   No Known Allergies  Current Meds  Medication Sig   colchicine 0.6 MG tablet Day 1: 1.6 mg ( 2 tablets) for the first dose, and then 0.6 mg ( 1 tablet) in 1  hour. After day 1, take 0.6 mg twice daily for three days or until symptoms resolve   losartan (COZAAR) 25 MG tablet Take 1 tablet (25 mg total) by mouth daily.       Objective:   Physical Exam BP (!) 160/80 (BP Location: Left Arm, Patient Position: Sitting, Cuff Size: Normal)   Pulse 69   Temp 98.1 F (36.7 C) (Oral)   Ht 6' (1.829 m)   Wt 228 lb 9.6 oz (103.7 kg)   SpO2 96%   BMI 31.00 kg/m   SpO2: 96 % O2 Device: None (Room air)  GENERAL: Well-developed, overweight, gentleman, no acute distress.  Fully ambulatory.  Very hard of hearing HEAD: Normocephalic, atraumatic.  EYES: Pupils equal, round, reactive to light.  No scleral icterus.  MOUTH: Mallampati IV, missing teeth.  Oral mucosa moist. NECK: Supple. No thyromegaly. Trachea midline. No JVD.  No adenopathy. PULMONARY: Good air entry bilaterally.  No adventitious sounds. CARDIOVASCULAR: S1 and S2. Regular rate and rhythm.  No rubs, murmurs or gallops heard. ABDOMEN: Benign. MUSCULOSKELETAL: No joint deformity, no clubbing, no edema.  NEUROLOGIC: Very hard of hearing.  Otherwise nonfocal. SKIN: Intact,warm,dry. PSYCH: Mood and behavior normal.     Assessment & Plan:     ICD-10-CM   1. Multiple pulmonary nodules determined by computed tomography of lung  R91.8    Likely recurrence of HPV driven cancer Await Nav DX (drawn 26 April)    2. Tonsillar cancer (HCC)  C09.9    This issue adds complexity to his management HPV driven Follows with oncology     Will see the patient in follow-up in 2 to 3 months time he is to call sooner should any new problems arise.  Gailen Shelter, MD Advanced Bronchoscopy PCCM [facility]    *This note was dictated using voice recognition software/Dragon.  Despite best efforts to proofread, errors can occur which can change the meaning. Any transcriptional errors that result from this process are unintentional and may not be fully corrected at the time of  dictation.

## 2022-10-31 NOTE — Patient Instructions (Signed)
We will see him in follow-up in 2 to 3 months time call sooner should any new problems arise.

## 2022-11-06 ENCOUNTER — Encounter: Payer: Self-pay | Admitting: Internal Medicine

## 2022-11-13 ENCOUNTER — Inpatient Hospital Stay: Payer: Medicare HMO | Attending: Internal Medicine | Admitting: Internal Medicine

## 2022-11-13 ENCOUNTER — Encounter: Payer: Self-pay | Admitting: Internal Medicine

## 2022-11-13 VITALS — BP 180/93 | HR 91 | Temp 98.6°F | Resp 18 | Ht 72.0 in | Wt 227.0 lb

## 2022-11-13 DIAGNOSIS — Z800 Family history of malignant neoplasm of digestive organs: Secondary | ICD-10-CM | POA: Insufficient documentation

## 2022-11-13 DIAGNOSIS — Z85818 Personal history of malignant neoplasm of other sites of lip, oral cavity, and pharynx: Secondary | ICD-10-CM | POA: Insufficient documentation

## 2022-11-13 DIAGNOSIS — R918 Other nonspecific abnormal finding of lung field: Secondary | ICD-10-CM | POA: Diagnosis not present

## 2022-11-13 DIAGNOSIS — C099 Malignant neoplasm of tonsil, unspecified: Secondary | ICD-10-CM | POA: Diagnosis not present

## 2022-11-13 NOTE — Progress Notes (Signed)
Remains with no symptoms. Here for further follow up.

## 2022-11-13 NOTE — Assessment & Plan Note (Addendum)
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  However concern for recurrence-FEB 10th, 2024-  Multiple pulmonary nodules in the lungs, new compared to the prior study, concerning for metastatic disease. PET  09/01/2022-The 3 largest pulmonary nodule seen on recent chest CT are hypermetabolic, most consistent with metastatic disease.  Single small hypermetabolic lymph node in the left base of the mesentery is nonspecific. Cannot exclude metastatic disease. No evidence of local recurrence in the neck or other distant metastases in the abdomen or pelvis.  # S/p evaluation with pulmonary/biopsy-negative for malignancy. NavDx testing- APRIL 2024- NEGATIVE.  Patient continues denial of possible recurrence. However, continues to have significant concern for recurrence based on imaging.  I had a long discussion with the with his wife and patient regarding my concerns-recommend a follow-up CT scan in 2 months or so and consider repeat biopsy based on imaging findings.  Discussed with Dr. Jayme Cloud.  # Mild intermittent hypercalcemia PCP -vit 25-OH-28 [FEB 2024]-  ca VIt D.  Multiple myeloma- WNL;  panel kappa, lambda light chain ratio- 1.8; monitor for now. Stable.   # Hearing loss s/p Cisplatin-monitor for now. Stable.   # Hx of CAD- Stable.   I spoke at length with the patient's family- regarding the patient's clinical status/plan of care.  Family agreement. Offered to speak to daughter, but wife states the daughter will call back with questions.    # DISPOSITION: # follow up in third week of JULY 2024- -MD; labs- cbc/cmp; CT prior- - Dr.B  # I reviewed the blood work- with the patient in detail; also reviewed the imaging independently [as summarized above]; and with the patient in detail.

## 2022-11-13 NOTE — Progress Notes (Signed)
[facility] CONSULT NOTE  Patient Care Team: Alfredia Ferguson, PA-C as PCP - General (Physician Assistant) Delight Ovens, MD (Inactive) as Consulting Physician (Cardiothoracic Surgery) Marcina Millard, MD as Consulting Physician (Cardiology) Serena Croissant, MD as Consulting Physician (Hematology and Oncology) Lonie Peak, MD as Attending Physician (Radiation Oncology) [name], Lise Auer, RN as Oncology Nurse Navigator [name], Karie Georges, CCC-SLP as Speech Language Pathologist (Speech Pathology) Charlynne Pander, DDS (Inactive) as Consulting Physician (Dentistry) Jeanella Craze, Remi Deter, PT as Physical Therapist (Physical Therapy) Sallee Lange, LCSW (Inactive) as Social Worker (General Practice) Ramond Dial, Nani Ravens, LCSW (Inactive) as Social Worker Anabel Bene, RD as Dietitian (Nutrition)  CHIEF COMPLAINTS/PURPOSE OF CONSULTATION: tonsil cancer; and lung nodules.   Oncology History Overview Note  # SEP 2021- HPV positive- stage III/IV- cisplatin-RT [Dr.Gudena/Squire]-  # FEB 2024- Multiple pulmonary nodules are noted in the lungs bilaterally, new compared to the prior study, largest of which is in the posteromedial aspect of the superior segment of the left lower lobe (axial image 70 of series 4) measuring 1.7 x 1.3 cm. Other prominent nodules include a 10 x 9 mm nodule in the posteromedial right lower lobe (axial image 70 of series 4), and a 8 mm nodule in the left upper lobe (axial image 44 of series 4).     Tonsillar cancer (HCC)  01/14/2020 Initial Diagnosis   Squamous cell carcinoma with basaloid features   01/26/2020 PET scan   Right tonsillar mass extending to bilateral tongue base 4.9 cm.  Two right cervical lymph nodes metastases measuring 2.5 cm and 2.1 cm   02/17/2020 - 03/23/2020 Chemotherapy   Patient is on Treatment Plan : HEAD/NECK Cisplatin + XRT q21d     02/17/2020 Cancer Staging   Staging form: Pharynx - P16 Negative Oropharynx,  AJCC 8th Edition - Clinical stage from 02/17/2020: Stage IVA (cT4a, cN2b, cM0, p16+) - Signed by Serena Croissant, MD on 03/02/2020   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HCC)  01/27/2020 Cancer Staging   Staging form: Pharynx - HPV-Mediated Oropharynx, AJCC 8th Edition - Clinical stage from 01/27/2020: Stage III (cT4, cN1, cM0, p16+) - Signed by Lonie Peak, MD on 01/31/2020   01/31/2020 Initial Diagnosis   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HCC)     HISTORY OF PRESENTING ILLNESS: Patient is hard of hearing.  Accompanied by his wife.   Omar Christian 67 y.o.  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  Remains with no symptoms.   He has residual swallowing difficulties, bad hearing and bad teeth from chemo/radiation. Appetite is great. Energy is good.   Denies any nausea vomiting abdominal pain.  Review of Systems  Constitutional:  Negative for chills, diaphoresis, fever, malaise/fatigue and weight loss.  HENT:  Negative for nosebleeds and sore throat.   Eyes:  Negative for double vision.  Respiratory:  Negative for cough, hemoptysis, sputum production, shortness of breath and wheezing.   Cardiovascular:  Negative for chest pain, palpitations, orthopnea and leg swelling.  Gastrointestinal:  Negative for abdominal pain, blood in stool, constipation, diarrhea, heartburn, melena, nausea and vomiting.  Genitourinary:  Negative for dysuria, frequency and urgency.  Musculoskeletal:  Negative for back pain and joint pain.  Skin: Negative.  Negative for itching and rash.  Neurological:  Negative for dizziness, tingling, focal weakness, weakness and headaches.  Endo/Heme/Allergies:  Does not bruise/bleed easily.  Psychiatric/Behavioral:  Negative for depression. The patient is not nervous/anxious and does not have insomnia.      MEDICAL HISTORY:  Past Medical History:  Diagnosis Date   Abnormal findings on cardiac catheterization 09/30/2015   [facility]   Abnormal myocardial perfusion study 09/24/2015   [facility]   CAD, multiple vessel 09/30/2015   per CATH @ [facility]/DR.PARACHOS   Cancer (HCC)    Coronary artery disease    GERD (gastroesophageal reflux disease)    Gout    H/O echocardiogram 09/24/2015   [facility]   Hard of hearing    History of pneumonia    Hyperlipidemia    Hypertension    Pneumonia    Progressive angina (HCC)    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    SURGICAL HISTORY: Past Surgical History:  Procedure Laterality Date   CARDIAC CATHETERIZATION N/A 09/30/2015   Procedure: Left Heart Cath and Coronary Angiography;  Surgeon: Marcina Millard, MD;  Location: [facility] INVASIVE CV LAB;  Service: Cardiovascular;  Laterality: N/A;   CORONARY ARTERY BYPASS GRAFT N/A 10/08/2015   Procedure: CORONARY ARTERY BYPASS GRAFTING time 4 using left internal mammary and right greater saphenous vein harvested by endovein;  Surgeon: Delight Ovens, MD;  Location: [facility] OR;  Service: Open Heart Surgery;  Laterality: N/A;   CORONARY ARTERY BYPASS GRAFT     ENDOBRONCHIAL ULTRASOUND Left 09/27/2022   Procedure: ENDOBRONCHIAL ULTRASOUND;  Surgeon: Salena Saner, MD;  Location: [facility] ORS;  Service: Pulmonary;  Laterality: Left;   IR GASTROSTOMY TUBE MOD SED  02/10/2020   IR GASTROSTOMY TUBE REMOVAL  10/12/2020   IR IMAGING GUIDED PORT INSERTION  02/10/2020   IR REMOVAL TUN ACCESS W/ PORT W/O FL MOD SED  10/12/2020   MULTIPLE EXTRACTIONS WITH ALVEOLOPLASTY N/A 02/04/2020   Procedure: Extraction of tooth #'s [phone], and 32 with alveoloplasty and gross debridement of remaining teeth.;  Surgeon: Charlynne Pander, DDS;  Location: [facility] OR;  Service: Oral Surgery;  Laterality: N/A;   TEE WITHOUT CARDIOVERSION N/A 10/08/2015   Procedure: TRANSESOPHAGEAL ECHOCARDIOGRAM (TEE);   Surgeon: Delight Ovens, MD;  Location: [facility] OR;  Service: Open Heart Surgery;  Laterality: N/A;   VASECTOMY     WISDOM TOOTH EXTRACTION      SOCIAL HISTORY: Social History   Socioeconomic History   Marital status: Married    Spouse name: Eber Jones   Number of children: 2   Years of education: Not on file   Highest education level: Not on file  Occupational History   Not on file  Tobacco Use   Smoking status: Never   Smokeless tobacco: Never  Vaping Use   Vaping Use: Never used  Substance and Sexual Activity   Alcohol use: No   Drug use: No   Sexual activity: Not Currently    Birth control/protection: Surgical    Comment: Vasectomy  Other Topics Concern   Not on file  Social History Narrative   Not on file   Social Determinants of Health   Financial Resource Strain: Low Risk  (10/10/2022)   Overall Financial Resource Strain (CARDIA)    Difficulty of Paying Living Expenses: Not very hard  Food Insecurity: No Food Insecurity (10/10/2022)   Hunger Vital Sign    Worried About Running Out of Food in the Last Year: Never true    Ran Out of Food in the Last Year: Never true  Transportation Needs: No  Transportation Needs (10/10/2022)   PRAPARE - Administrator, Civil Service (Medical): No    Lack of Transportation (Non-Medical): No  Physical Activity: Insufficiently Active (10/10/2022)   Exercise Vital Sign    Days of Exercise per Week: 3 days    Minutes of Exercise per Session: 30 min  Stress: No Stress Concern Present (10/10/2022)   [employer] of Occupational Health - Occupational Stress Questionnaire    Feeling of Stress : Not at all  Social Connections: Moderately Integrated (10/10/2022)   Social Connection and Isolation Panel [NHANES]    Frequency of Communication with Friends and Family: Three times a week    Frequency of Social Gatherings with Friends and Family: Never    Attends Religious Services: More than 4 times per year    Active Member of [employer] or  Organizations: No    Attends Banker Meetings: Never    Marital Status: Married  Catering manager Violence: Not At Risk (10/10/2022)   Humiliation, Afraid, Rape, and Kick questionnaire    Fear of Current or Ex-Partner: No    Emotionally Abused: No    Physically Abused: No    Sexually Abused: No    FAMILY HISTORY: Family History  Problem Relation Age of Onset   Heart disease Mother    Hypertension Mother    Alcohol abuse Father    Throat cancer Father    Muscular dystrophy Brother    Muscular dystrophy Brother     ALLERGIES:  has No Known Allergies.  MEDICATIONS:  Current Outpatient Medications  Medication Sig Dispense Refill   colchicine 0.6 MG tablet Day 1: 1.6 mg ( 2 tablets) for the first dose, and then 0.6 mg ( 1 tablet) in 1 hour. After day 1, take 0.6 mg twice daily for three days or until symptoms resolve 20 tablet 0   losartan (COZAAR) 25 MG tablet Take 1 tablet (25 mg total) by mouth daily. 90 tablet 1   No current facility-administered medications for this visit.    PHYSICAL EXAMINATION:   Vitals:   11/13/22 1303  BP: (!) 180/93  Pulse: 91  Resp: 18  Temp: 98.6 F (37 C)  SpO2: 97%   Filed Weights   11/13/22 1303  Weight: 227 lb (103 kg)    Physical Exam Vitals and nursing note reviewed.  HENT:     Head: Normocephalic and atraumatic.     Mouth/Throat:     Pharynx: Oropharynx is clear.  Eyes:     Extraocular Movements: Extraocular movements intact.     Pupils: Pupils are equal, round, and reactive to light.  Cardiovascular:     Rate and Rhythm: Normal rate and regular rhythm.  Pulmonary:     Comments: Decreased breath sounds bilaterally.  Abdominal:     Palpations: Abdomen is soft.  Musculoskeletal:        General: Normal range of motion.     Cervical back: Normal range of motion.  Skin:    General: Skin is warm.  Neurological:     General: No focal deficit present.     Mental Status: He is alert and oriented to person,  place, and time.  Psychiatric:        Behavior: Behavior normal.        Judgment: Judgment normal.     LABORATORY DATA:  I have reviewed the data as listed Lab Results  Component Value Date   WBC 4.3 08/24/2022   HGB 14.6 08/24/2022   HCT 42.8 08/24/2022  MCV 90.1 08/24/2022   PLT 144 (L) 08/24/2022   Recent Labs    08/15/22 1012 08/24/22 1149 10/17/22 1344  NA 140 136 136  K 4.7 4.0 4.8  CL 100 104 98  CO2 23 24 23   GLUCOSE 127* 116* 169*  BUN 16 13 11   CREATININE 1.22 1.14 1.27  CALCIUM 10.7* 9.3 9.5  GFRNONAA  --  >60  --   PROT 6.9 7.1 6.4  ALBUMIN 4.7 4.2 4.4  AST 22 28 18   ALT 28 30 25   ALKPHOS 76 64 83  BILITOT 0.7 0.8 0.6    RADIOGRAPHIC STUDIES: I have personally reviewed the radiological images as listed and agreed with the findings in the report. No results found.   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  However concern for recurrence-FEB 10th, 2024-  Multiple pulmonary nodules in the lungs, new compared to the prior study, concerning for metastatic disease. PET  09/01/2022-The 3 largest pulmonary nodule seen on recent chest CT are hypermetabolic, most consistent with metastatic disease.  Single small hypermetabolic lymph node in the left base of the mesentery is nonspecific. Cannot exclude metastatic disease. No evidence of local recurrence in the neck or other distant metastases in the abdomen or pelvis.  # S/p evaluation with pulmonary/biopsy-negative for malignancy. NavDx testing- APRIL 2024- NEGATIVE.  Patient continues denial of possible recurrence. However, continues to have significant concern for recurrence based on imaging.  I had a long discussion with the with his wife and patient regarding my concerns-recommend a follow-up CT scan in 2 months or so and consider repeat biopsy based on imaging findings.  Discussed with Dr. Jayme Cloud.  # Mild  intermittent hypercalcemia PCP -vit 25-OH-28 [FEB 2024]-  ca VIt D.  Multiple myeloma- WNL;  panel kappa, lambda light chain ratio- 1.8; monitor for now. Stable.   # Hearing loss s/p Cisplatin-monitor for now. Stable.   # Hx of CAD- Stable.   I spoke at length with the patient's family- regarding the patient's clinical status/plan of care.  Family agreement. Offered to speak to daughter, but wife states the daughter will call back with questions.    # DISPOSITION: # follow up in third week of JULY 2024- -MD; labs- cbc/cmp; CT prior- - Dr.B  # I reviewed the blood work- with the patient in detail; also reviewed the imaging independently [as summarized above]; and with the patient in detail.    Above plan of care was discussed with patient/family in detail.  My contact information was given to the patient/family.     Earna Coder, MD 11/13/2022 1:45 PM

## 2022-11-14 ENCOUNTER — Encounter: Payer: Self-pay | Admitting: Physician Assistant

## 2022-11-14 ENCOUNTER — Ambulatory Visit (INDEPENDENT_AMBULATORY_CARE_PROVIDER_SITE_OTHER): Payer: Medicare HMO | Admitting: Physician Assistant

## 2022-11-14 VITALS — BP 163/81 | HR 78 | Ht 72.0 in | Wt 229.0 lb

## 2022-11-14 DIAGNOSIS — I10 Essential (primary) hypertension: Secondary | ICD-10-CM | POA: Diagnosis not present

## 2022-11-14 DIAGNOSIS — M1A0710 Idiopathic chronic gout, right ankle and foot, without tophus (tophi): Secondary | ICD-10-CM

## 2022-11-14 MED ORDER — LOSARTAN POTASSIUM-HCTZ 50-12.5 MG PO TABS: 1.0000 | ORAL_TABLET | Freq: Every day | ORAL | 1 refills | Status: DC

## 2022-11-14 MED ORDER — COLCHICINE 0.6 MG PO TABS: ORAL_TABLET | ORAL | 0 refills | Status: DC

## 2022-11-14 NOTE — Assessment & Plan Note (Signed)
Switch losartan 25 to hyzaar 50 - 12.5 mg  F/u in 4 weeks

## 2022-11-14 NOTE — Assessment & Plan Note (Signed)
Given in frequent flares, rx colchicine PRN use.

## 2022-11-14 NOTE — Progress Notes (Signed)
I,Sha'taria Tyson,acting as a Neurosurgeon for [employer], PA-C.,have documented all relevant documentation on the behalf of Alfredia Ferguson, PA-C,as directed by  Alfredia Ferguson, PA-C while in the presence of Alfredia Ferguson, PA-C.   Established patient visit   Patient: Omar Christian   DOB: 1955/10/19   67 y.o. Male  MRN: [id number] Visit Date: 11/14/2022  Today's healthcare provider: Alfredia Ferguson, PA-C   Cc. Htn f/u  Subjective    HPI  Hypertension, follow-up  BP Readings from Last 3 Encounters:  11/14/22 (!) 163/81  11/13/22 (!) 180/93  10/31/22 (!) 160/80   Wt Readings from Last 3 Encounters:  11/14/22 229 lb (103.9 kg)  11/13/22 227 lb (103 kg)  10/31/22 228 lb 9.6 oz (103.7 kg)     He was last seen for hypertension 4 weeks ago.  BP at that visit was 169/96. Management since that visit includes start losartan 25 mg.  He reports excellent compliance with treatment. He is not having side effects.  He is following a Low Sodium diet. He is exercising. He does not smoke.  Symptoms: No chest pain No chest pressure  No palpitations No syncope  No dyspnea No orthopnea  No paroxysmal nocturnal dyspnea No lower extremity edema   Pertinent labs Lab Results  Component Value Date   CHOL 235 (H) 08/15/2022   HDL 46 08/15/2022   LDLCALC 136 (H) 08/15/2022   TRIG 297 (H) 08/15/2022   CHOLHDL 5.1 (H) 08/15/2022   Lab Results  Component Value Date   NA 136 10/17/2022   K 4.8 10/17/2022   CREATININE 1.27 10/17/2022   EGFR 62 10/17/2022   GLUCOSE 169 (H) 10/17/2022   TSH 2.298 01/18/2021     The 10-year ASCVD risk score (Arnett DK, et al., 2019) is: 26.9%  ---------------------------------------------------------------------------------------------------  Pt has questions regarding his gout and preventative medication.  Medications: Outpatient Medications Prior to Visit  Medication Sig   [DISCONTINUED] colchicine 0.6 MG tablet Day 1: 1.6 mg ( 2 tablets) for  the first dose, and then 0.6 mg ( 1 tablet) in 1 hour. After day 1, take 0.6 mg twice daily for three days or until symptoms resolve   [DISCONTINUED] losartan (COZAAR) 25 MG tablet Take 1 tablet (25 mg total) by mouth daily.   No facility-administered medications prior to visit.    Review of Systems  Constitutional:  Negative for fatigue and fever.  Respiratory:  Negative for cough and shortness of breath.   Cardiovascular:  Negative for chest pain, palpitations and leg swelling.  Neurological:  Negative for dizziness and headaches.      Objective    BP (!) 163/81 (BP Location: Right Arm, Patient Position: Sitting, Cuff Size: Normal)   Pulse 78   Ht 6' (1.829 m)   Wt 229 lb (103.9 kg)   BMI 31.06 kg/m  Physical Exam Vitals reviewed.  Constitutional:      Appearance: He is not ill-appearing.  HENT:     Head: Normocephalic.  Eyes:     Conjunctiva/sclera: Conjunctivae normal.  Cardiovascular:     Rate and Rhythm: Normal rate.  Pulmonary:     Effort: Pulmonary effort is normal. No respiratory distress.  Neurological:     General: No focal deficit present.     Mental Status: He is alert and oriented to person, place, and time.  Psychiatric:        Mood and Affect: Mood normal.        Behavior: Behavior normal.  No results found for any visits on 11/14/22.  Assessment & Plan     Problem List Items Addressed This Visit       Cardiovascular and Mediastinum   Essential (primary) hypertension - Primary    Switch losartan 25 to hyzaar 50 - 12.5 mg  F/u in 4 weeks      Relevant Medications   losartan-hydrochlorothiazide (HYZAAR) 50-12.5 MG tablet     Musculoskeletal and Integument   Chronic gout of right foot    Given in frequent flares, rx colchicine PRN use.        Relevant Medications   colchicine 0.6 MG tablet    Return in about 4 weeks (around 12/12/2022) for hypertension.      I, Alfredia Ferguson, PA-C have reviewed all documentation for this visit.  The documentation on  11/14/22   for the exam, diagnosis, procedures, and orders are all accurate and complete.  Alfredia Ferguson, PA-C [facility] [address] [place], [place], [address] Office: [phone] Fax: [phone]   [facility]

## 2022-11-14 NOTE — Patient Instructions (Signed)
Omron blood pressure monitor

## 2022-12-14 NOTE — Progress Notes (Signed)
I,Vanessa  Vital,acting as a Neurosurgeon for [employer], PA-C.,have documented all relevant documentation on the behalf of Alfredia Ferguson, PA-C,as directed by  Alfredia Ferguson, PA-C while in the presence of Alfredia Ferguson, PA-C.   Established patient visit   Patient: Omar Christian   DOB: [birth year]   67 y.o. Male  MRN: [id number] Visit Date: 12/15/2022  Today's healthcare provider: Alfredia Ferguson, PA-C   Cc. HTN f/u  Subjective    HPI  Hypertension, follow-up  BP Readings from Last 3 Encounters:  12/15/22 (!) 124/58  11/14/22 (!) 163/81  11/13/22 (!) 180/93   Wt Readings from Last 3 Encounters:  12/15/22 231 lb 14.4 oz (105.2 kg)  11/14/22 229 lb (103.9 kg)  11/13/22 227 lb (103 kg)     He was last seen for hypertension 4 weeks ago.  BP at that visit was 163/81. Management since that visit includes started Hyzaar 50-12.5 mg.  He reports good compliance with treatment. He is not having side effects.   Outside blood pressures are does not check Symptoms: No chest pain No chest pressure  No palpitations No syncope  No dyspnea No orthopnea  No paroxysmal nocturnal dyspnea No lower extremity edema   Pertinent labs Lab Results  Component Value Date   CHOL 235 (H) 08/15/2022   HDL 46 08/15/2022   LDLCALC 136 (H) 08/15/2022   TRIG 297 (H) 08/15/2022   CHOLHDL 5.1 (H) 08/15/2022   Lab Results  Component Value Date   NA 136 10/17/2022   K 4.8 10/17/2022   CREATININE 1.27 10/17/2022   EGFR 62 10/17/2022   GLUCOSE 169 (H) 10/17/2022   TSH 2.298 01/18/2021     The 10-year ASCVD risk score (Arnett DK, et al., 2019) is: 17.4%  ---------------------------------------------------------------------------------------------------   Medications: Outpatient Medications Prior to Visit  Medication Sig   losartan-hydrochlorothiazide (HYZAAR) 50-12.5 MG tablet Take 1 tablet by mouth daily.   colchicine 0.6 MG tablet Day 1: 1.6 mg ( 2 tablets) for the first dose, and then  0.6 mg ( 1 tablet) in 1 hour. After day 1, take 0.6 mg twice daily for three days or until symptoms resolve (Patient not taking: Reported on 12/15/2022)   No facility-administered medications prior to visit.    Review of Systems  All other systems reviewed and are negative.    Objective    BP (!) 124/58 (BP Location: Right Arm, Patient Position: Sitting, Cuff Size: Large)   Pulse 84   Temp 98.2 F (36.8 C) (Oral)   Resp 14   Ht 6\' 1"  (1.854 m)   Wt 231 lb 14.4 oz (105.2 kg)   SpO2 98%   BMI 30.60 kg/m   Physical Exam Vitals reviewed.  Constitutional:      Appearance: He is not ill-appearing.  HENT:     Head: Normocephalic.  Eyes:     Conjunctiva/sclera: Conjunctivae normal.  Cardiovascular:     Rate and Rhythm: Normal rate.  Pulmonary:     Effort: Pulmonary effort is normal. No respiratory distress.  Neurological:     General: No focal deficit present.     Mental Status: He is alert and oriented to person, place, and time.  Psychiatric:        Mood and Affect: Mood normal.        Behavior: Behavior normal.      No results found for any visits on 12/15/22.  Assessment & Plan     Problem List Items Addressed This Visit  Cardiovascular and Mediastinum   Essential (primary) hypertension - Primary    Now in appropriate range Cont losartan 50 hydrochlorothiazide 12.5  Next visit repeat cmp F/up 4 -6 mo        Return in about 6 months (around 06/16/2023) for hypertension.      I, Alfredia Ferguson, PA-C have reviewed all documentation for this visit. The documentation on  12/15/22   for the exam, diagnosis, procedures, and orders are all accurate and complete.  Alfredia Ferguson, PA-C [facility] [address] [place], [place], [address] Office: [phone] Fax: [phone]   [facility]

## 2022-12-15 ENCOUNTER — Ambulatory Visit (INDEPENDENT_AMBULATORY_CARE_PROVIDER_SITE_OTHER): Payer: Medicare HMO | Admitting: Physician Assistant

## 2022-12-15 ENCOUNTER — Encounter: Payer: Self-pay | Admitting: Physician Assistant

## 2022-12-15 VITALS — BP 124/58 | HR 84 | Temp 98.2°F | Resp 14 | Ht 73.0 in | Wt 231.9 lb

## 2022-12-15 DIAGNOSIS — I10 Essential (primary) hypertension: Secondary | ICD-10-CM | POA: Diagnosis not present

## 2022-12-15 NOTE — Assessment & Plan Note (Signed)
Now in appropriate range Cont losartan 50 hydrochlorothiazide 12.5  Next visit repeat cmp F/up 4 -6 mo

## 2023-01-17 ENCOUNTER — Ambulatory Visit
Admission: RE | Admit: 2023-01-17 | Discharge: 2023-01-17 | Disposition: A | Discharge status: Home / Self Care | Payer: Medicare HMO | Source: Ambulatory Visit | Attending: Internal Medicine | Admitting: Internal Medicine

## 2023-01-17 DIAGNOSIS — R918 Other nonspecific abnormal finding of lung field: Secondary | ICD-10-CM | POA: Diagnosis present

## 2023-01-17 DIAGNOSIS — C099 Malignant neoplasm of tonsil, unspecified: Secondary | ICD-10-CM | POA: Insufficient documentation

## 2023-01-17 MED ORDER — IOHEXOL 300 MG/ML  SOLN
75.0000 mL | Freq: Once | INTRAMUSCULAR | Status: AC | PRN
  Administered 2023-01-17: 75 mL via INTRAVENOUS

## 2023-01-25 ENCOUNTER — Inpatient Hospital Stay: Payer: Medicare HMO | Admitting: Internal Medicine

## 2023-01-25 ENCOUNTER — Inpatient Hospital Stay: Payer: Medicare HMO

## 2023-01-29 ENCOUNTER — Inpatient Hospital Stay: Payer: Medicare HMO | Attending: Internal Medicine

## 2023-01-29 ENCOUNTER — Inpatient Hospital Stay (HOSPITAL_BASED_OUTPATIENT_CLINIC_OR_DEPARTMENT_OTHER): Payer: Medicare HMO | Admitting: Internal Medicine

## 2023-01-29 ENCOUNTER — Encounter: Payer: Self-pay | Admitting: Internal Medicine

## 2023-01-29 VITALS — BP 169/83 | HR 62 | Temp 98.7°F | Ht 73.0 in | Wt 230.0 lb

## 2023-01-29 DIAGNOSIS — Z800 Family history of malignant neoplasm of digestive organs: Secondary | ICD-10-CM | POA: Insufficient documentation

## 2023-01-29 DIAGNOSIS — I2510 Atherosclerotic heart disease of native coronary artery without angina pectoris: Secondary | ICD-10-CM | POA: Insufficient documentation

## 2023-01-29 DIAGNOSIS — C099 Malignant neoplasm of tonsil, unspecified: Secondary | ICD-10-CM

## 2023-01-29 DIAGNOSIS — R918 Other nonspecific abnormal finding of lung field: Secondary | ICD-10-CM

## 2023-01-29 DIAGNOSIS — H9190 Unspecified hearing loss, unspecified ear: Secondary | ICD-10-CM | POA: Insufficient documentation

## 2023-01-29 DIAGNOSIS — Z85818 Personal history of malignant neoplasm of other sites of lip, oral cavity, and pharynx: Secondary | ICD-10-CM | POA: Diagnosis present

## 2023-01-29 DIAGNOSIS — I700 Atherosclerosis of aorta: Secondary | ICD-10-CM | POA: Diagnosis not present

## 2023-01-29 LAB — CBC WITH DIFFERENTIAL (CANCER CENTER ONLY)
Abs Immature Granulocytes: 0.02 10*3/uL (ref 0.00–0.07)
Basophils Absolute: 0 10*3/uL (ref 0.0–0.1)
Basophils Relative: 1 %
Eosinophils Absolute: 0.1 10*3/uL (ref 0.0–0.5)
Eosinophils Relative: 1 %
HCT: 41.2 % (ref 39.0–52.0)
Hemoglobin: 14.2 g/dL (ref 13.0–17.0)
Immature Granulocytes: 1 %
Lymphocytes Relative: 19 %
Lymphs Abs: 0.8 10*3/uL (ref 0.7–4.0)
MCH: 30.9 pg (ref 26.0–34.0)
MCHC: 34.5 g/dL (ref 30.0–36.0)
MCV: 89.8 fL (ref 80.0–100.0)
Monocytes Absolute: 0.2 10*3/uL (ref 0.1–1.0)
Monocytes Relative: 6 %
Neutro Abs: 3.1 10*3/uL (ref 1.7–7.7)
Neutrophils Relative %: 72 %
Platelet Count: 138 10*3/uL — ABNORMAL LOW (ref 150–400)
RBC: 4.59 MIL/uL (ref 4.22–5.81)
RDW: 13.6 % (ref 11.5–15.5)
WBC Count: 4.2 10*3/uL (ref 4.0–10.5)
nRBC: 0 % (ref 0.0–0.2)

## 2023-01-29 LAB — CMP (CANCER CENTER ONLY)
ALT: 28 U/L (ref 0–44)
AST: 24 U/L (ref 15–41)
Albumin: 4.1 g/dL (ref 3.5–5.0)
Alkaline Phosphatase: 60 U/L (ref 38–126)
Anion gap: 10 (ref 5–15)
BUN: 22 mg/dL (ref 8–23)
CO2: 25 mmol/L (ref 22–32)
Calcium: 9 mg/dL (ref 8.9–10.3)
Chloride: 104 mmol/L (ref 98–111)
Creatinine: 1.25 mg/dL — ABNORMAL HIGH (ref 0.61–1.24)
GFR, Estimated: >60 mL/min (ref >60)
Glucose, Bld: 111 mg/dL — ABNORMAL HIGH (ref 70–99)
Potassium: 3.8 mmol/L (ref 3.5–5.1)
Sodium: 139 mmol/L (ref 135–145)
Total Bilirubin: 0.9 mg/dL (ref 0.3–1.2)
Total Protein: 7.2 g/dL (ref 6.5–8.1)

## 2023-01-29 NOTE — Progress Notes (Signed)
[facility] CONSULT NOTE  Patient Care Team: Alfredia Ferguson, PA-C as PCP - General (Physician Assistant) Delight Ovens, MD (Inactive) as Consulting Physician (Cardiothoracic Surgery) Marcina Millard, MD as Consulting Physician (Cardiology) Serena Croissant, MD as Consulting Physician (Hematology and Oncology) Lonie Peak, MD as Attending Physician (Radiation Oncology) [name], Lise Auer, RN as Oncology Nurse Navigator [name], Karie Georges, CCC-SLP as Speech Language Pathologist (Speech Pathology) Charlynne Pander, DDS (Inactive) as Consulting Physician (Dentistry) Jeanella Craze, Remi Deter, PT as Physical Therapist (Physical Therapy) Sallee Lange, LCSW (Inactive) as Social Worker (General Practice) Ramond Dial, Nani Ravens, LCSW (Inactive) as Social Worker Anabel Bene, RD as Dietitian (Nutrition)  CHIEF COMPLAINTS/PURPOSE OF CONSULTATION: tonsil cancer; and lung nodules.   Oncology History Overview Note  # SEP 2021- HPV positive- stage III/IV- cisplatin-RT [Dr.Gudena/[facility]]-  # FEB 2024- Multiple pulmonary nodules are noted in the lungs bilaterally, new compared to the prior study, largest of which is in the posteromedial aspect of the superior segment of the left lower lobe (axial image 70 of series 4) measuring 1.7 x 1.3 cm. Other prominent nodules include a 10 x 9 mm nodule in the posteromedial right lower lobe (axial image 70 of series 4), and a 8 mm nodule in the left upper lobe (axial image 44 of series 4).     Tonsillar cancer (HCC)  01/14/2020 Initial Diagnosis   Squamous cell carcinoma with basaloid features   01/26/2020 PET scan   Right tonsillar mass extending to bilateral tongue base 4.9 cm.  Two right cervical lymph nodes metastases measuring 2.5 cm and 2.1 cm   02/17/2020 - 03/23/2020 Chemotherapy   Patient is on Treatment Plan : HEAD/NECK Cisplatin + XRT q21d     02/17/2020 Cancer Staging   Staging form: Pharynx - P16 Negative Oropharynx,  AJCC 8th Edition - Clinical stage from 02/17/2020: Stage IVA (cT4a, cN2b, cM0, p16+) - Signed by Serena Croissant, MD on 03/02/2020   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HCC)  01/27/2020 Cancer Staging   Staging form: Pharynx - HPV-Mediated Oropharynx, AJCC 8th Edition - Clinical stage from 01/27/2020: Stage III (cT4, cN1, cM0, p16+) - Signed by Lonie Peak, MD on 01/31/2020   01/31/2020 Initial Diagnosis   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HCC)    HISTORY OF PRESENTING ILLNESS: Patient is hard of hearing.  Accompanied by his wife.   Ella Jubilee 67 y.o.  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  Remains with no symptoms.  Appetite is great. Energy is good.  Denies any nausea vomiting abdominal pain.  Review of Systems  Constitutional:  Negative for chills, diaphoresis, fever, malaise/fatigue and weight loss.  HENT:  Negative for nosebleeds and sore throat.   Eyes:  Negative for double vision.  Respiratory:  Negative for cough, hemoptysis, sputum production, shortness of breath and wheezing.   Cardiovascular:  Negative for chest pain, palpitations, orthopnea and leg swelling.  Gastrointestinal:  Negative for abdominal pain, blood in stool, constipation, diarrhea, heartburn, melena, nausea and vomiting.  Genitourinary:  Negative for dysuria, frequency and urgency.  Musculoskeletal:  Negative for back pain and joint pain.  Skin: Negative.  Negative for itching and rash.  Neurological:  Negative for dizziness, tingling, focal weakness, weakness and headaches.  Endo/Heme/Allergies:  Does not bruise/bleed easily.  Psychiatric/Behavioral:  Negative for depression. The patient is not nervous/anxious  and does not have insomnia.     MEDICAL HISTORY:  Past Medical History:  Diagnosis Date   Abnormal findings on cardiac catheterization  09/30/2015   [facility]   Abnormal myocardial perfusion study 09/24/2015   [facility]   CAD, multiple vessel 09/30/2015   per CATH @ [facility]/DR.PARACHOS   Cancer (HCC)    Coronary artery disease    GERD (gastroesophageal reflux disease)    Gout    H/O echocardiogram 09/24/2015   [facility]   Hard of hearing    History of pneumonia    Hyperlipidemia    Hypertension    Pneumonia    Progressive angina (HCC)    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    SURGICAL HISTORY: Past Surgical History:  Procedure Laterality Date   CARDIAC CATHETERIZATION N/A 09/30/2015   Procedure: Left Heart Cath and Coronary Angiography;  Surgeon: Marcina Millard, MD;  Location: [facility] INVASIVE CV LAB;  Service: Cardiovascular;  Laterality: N/A;   CORONARY ARTERY BYPASS GRAFT N/A 10/08/2015   Procedure: CORONARY ARTERY BYPASS GRAFTING time 4 using left internal mammary and right greater saphenous vein harvested by endovein;  Surgeon: Delight Ovens, MD;  Location: [facility] OR;  Service: Open Heart Surgery;  Laterality: N/A;   CORONARY ARTERY BYPASS GRAFT     ENDOBRONCHIAL ULTRASOUND Left 09/27/2022   Procedure: ENDOBRONCHIAL ULTRASOUND;  Surgeon: Salena Saner, MD;  Location: [facility] ORS;  Service: Pulmonary;  Laterality: Left;   IR GASTROSTOMY TUBE MOD SED  02/10/2020   IR GASTROSTOMY TUBE REMOVAL  10/12/2020   IR IMAGING GUIDED PORT INSERTION  02/10/2020   IR REMOVAL TUN ACCESS W/ PORT W/O FL MOD SED  10/12/2020   MULTIPLE EXTRACTIONS WITH ALVEOLOPLASTY N/A 02/04/2020   Procedure: Extraction of tooth #'s [phone], and 32 with alveoloplasty and gross debridement of remaining teeth.;  Surgeon: Charlynne Pander, DDS;  Location: [facility] OR;  Service: Oral Surgery;  Laterality: N/A;   TEE WITHOUT CARDIOVERSION N/A 10/08/2015   Procedure: TRANSESOPHAGEAL ECHOCARDIOGRAM (TEE);  Surgeon: Delight Ovens, MD;  Location: [facility] OR;  Service: Open Heart Surgery;  Laterality: N/A;   VASECTOMY      WISDOM TOOTH EXTRACTION      SOCIAL HISTORY: Social History   Socioeconomic History   Marital status: Married    Spouse name: Eber Jones   Number of children: 2   Years of education: Not on file   Highest education level: Not on file  Occupational History   Not on file  Tobacco Use   Smoking status: Never   Smokeless tobacco: Never  Vaping Use   Vaping status: Never Used  Substance and Sexual Activity   Alcohol use: No   Drug use: No   Sexual activity: Not Currently    Birth control/protection: Surgical    Comment: Vasectomy  Other Topics Concern   Not on file  Social History Narrative   Not on file   Social Determinants of Health   Financial Resource Strain: Low Risk  (10/10/2022)   Overall Financial Resource Strain (CARDIA)    Difficulty of Paying Living Expenses: Not very hard  Food Insecurity: No Food Insecurity (10/10/2022)   Hunger Vital Sign    Worried About Running Out of Food in the Last Year: Never true    Ran Out of Food in the Last Year: Never true  Transportation Needs: No Transportation Needs (10/10/2022)   PRAPARE - Administrator, Civil Service (Medical): No  Lack of Transportation (Non-Medical): No  Physical Activity: Insufficiently Active (10/10/2022)   Exercise Vital Sign    Days of Exercise per Week: 3 days    Minutes of Exercise per Session: 30 min  Stress: No Stress Concern Present (10/10/2022)   [employer] of Occupational Health - Occupational Stress Questionnaire    Feeling of Stress : Not at all  Social Connections: Moderately Integrated (10/10/2022)   Social Connection and Isolation Panel [NHANES]    Frequency of Communication with Friends and Family: Three times a week    Frequency of Social Gatherings with Friends and Family: Never    Attends Religious Services: More than 4 times per year    Active Member of [employer] or Organizations: No    Attends Banker Meetings: Never    Marital Status: Married  Catering manager  Violence: Not At Risk (10/10/2022)   Humiliation, Afraid, Rape, and Kick questionnaire    Fear of Current or Ex-Partner: No    Emotionally Abused: No    Physically Abused: No    Sexually Abused: No    FAMILY HISTORY: Family History  Problem Relation Age of Onset   Heart disease Mother    Hypertension Mother    Alcohol abuse Father    Throat cancer Father    Muscular dystrophy Brother    Muscular dystrophy Brother     ALLERGIES:  has No Known Allergies.  MEDICATIONS:  Current Outpatient Medications  Medication Sig Dispense Refill   colchicine 0.6 MG tablet Day 1: 1.6 mg ( 2 tablets) for the first dose, and then 0.6 mg ( 1 tablet) in 1 hour. After day 1, take 0.6 mg twice daily for three days or until symptoms resolve 20 tablet 0   losartan-hydrochlorothiazide (HYZAAR) 50-12.5 MG tablet Take 1 tablet by mouth daily. 90 tablet 1   No current facility-administered medications for this visit.    PHYSICAL EXAMINATION:   Vitals:   01/29/23 1300  BP: (!) 169/83  Pulse: 62  Temp: 98.7 F (37.1 C)  SpO2: 99%   Filed Weights   01/29/23 1300  Weight: 230 lb (104.3 kg)    Physical Exam Vitals and nursing note reviewed.  HENT:     Head: Normocephalic and atraumatic.     Mouth/Throat:     Pharynx: Oropharynx is clear.  Eyes:     Extraocular Movements: Extraocular movements intact.     Pupils: Pupils are equal, round, and reactive to light.  Cardiovascular:     Rate and Rhythm: Normal rate and regular rhythm.  Pulmonary:     Comments: Decreased breath sounds bilaterally.  Abdominal:     Palpations: Abdomen is soft.  Musculoskeletal:        General: Normal range of motion.     Cervical back: Normal range of motion.  Skin:    General: Skin is warm.  Neurological:     General: No focal deficit present.     Mental Status: He is alert and oriented to person, place, and time.  Psychiatric:        Behavior: Behavior normal.        Judgment: Judgment normal.      LABORATORY DATA:  I have reviewed the data as listed Lab Results  Component Value Date   WBC 4.2 01/29/2023   HGB 14.2 01/29/2023   HCT 41.2 01/29/2023   MCV 89.8 01/29/2023   PLT 138 (L) 01/29/2023   Recent Labs    08/24/22 1149 10/17/22 1344 01/29/23 1258  NA 136 136 139  K 4.0 4.8 3.8  CL 104 98 104  CO2 24 23 25   GLUCOSE 116* 169* 111*  BUN 13 11 22   CREATININE 1.14 1.27 1.25*  CALCIUM 9.3 9.5 9.0  GFRNONAA >60  --  >60  PROT 7.1 6.4 7.2  ALBUMIN 4.2 4.4 4.1  AST 28 18 24   ALT 30 25 28   ALKPHOS 64 83 60  BILITOT 0.8 0.6 0.9    RADIOGRAPHIC STUDIES: I have personally reviewed the radiological images as listed and agreed with the findings in the report. CT CHEST W CONTRAST  Result Date: 01/26/2023 CLINICAL DATA:  Lung nodule, > 8mm lung nodules * Tracking Code: BO * EXAM: CT CHEST WITH CONTRAST TECHNIQUE: Multidetector CT imaging of the chest was performed during intravenous contrast administration. RADIATION DOSE REDUCTION: This exam was performed according to the departmental dose-optimization program which includes automated exposure control, adjustment of the mA and/or kV according to patient size and/or use of iterative reconstruction technique. CONTRAST:  75mL OMNIPAQUE IOHEXOL 300 MG/ML  SOLN COMPARISON:  CT scan chest from 09/25/2022. FINDINGS: Cardiovascular: Normal cardiac size. No pericardial effusion. Patient is status post CABG. Moderate atherosclerotic coronary artery vascular calcifications again seen. Ascending aorta is again dilated measuring up 4.7 x 5.0 cm. However, please note, measurements are subject to cardiac pulsation artifact on this non cardiac gated exam. And therefore future follow-up should be performed with cardiac gated CT angiography of the chest. There are mild peripheral atherosclerotic vascular calcifications of the descending aorta. Mediastinum/Nodes: Visualized thyroid gland appears grossly unremarkable. No solid / cystic mediastinal  masses. The esophagus is nondistended precluding optimal assessment. No axillary, mediastinal or hilar lymphadenopathy by size criteria. Lungs/Pleura: The central tracheo-bronchial tree is patent. There are patchy areas of linear, plate-like atelectasis and/or scarring throughout bilateral lungs. No mass or consolidation. No pleural effusion or pneumothorax. There are several lung nodules, described as follows: *Left upper lobe measuring 8.6 x 9.0 mm, similar to the prior study. *Superior segment of left lower lobe abutting the descending thoracic aorta, currently measuring 1.7 x 1.9 cm, previously 1.3 x 1.6 cm. *Nodule in the right lung lower lobe, posteromedially measuring 1.1 x 1.3 cm, previously 0.9 x 1.0 cm. No new suspicious lung nodule. Upper Abdomen: Small sliding hiatal hernia noted. There are at least 2 cysts in liver, which are similar to the prior study. Small volume dependent gallstones/sludge noted without imaging evidence of acute cholecystitis. There is a partially imaged oval-shaped lesion with extensive calcifications inferior to the pancreas. This is incompletely imaged on the current examination but appear grossly similar to the prior study dating back to 01/09/2017 and was non FDG avid on the prior PET-CT scan from 09/01/2022, thus favoring benign etiology. Remaining visualized upper abdominal viscera within normal limits. Musculoskeletal: The visualized soft tissues of the chest wall are grossly unremarkable. No suspicious osseous lesions. There are mild to moderate multilevel degenerative changes in the visualized spine. Mild anterior wedging deformities of T6 and T8 vertebra are unchanged since the prior study. No significant retropulsion or spinal canal compromise. IMPRESSION: 1. Interval increase in size of bilateral lung nodules, concerning for metastatic disease. 2. Stable dilatation of the ascending aorta measuring up to 4.7 x 5.0 cm. However, please note, measurements are subject to  cardiac pulsation artifact on this non cardiac gated exam. Future follow-up should be performed with cardiac gated CT angiography of the chest. 3. Multiple other nonacute observations, as described above. Aortic Atherosclerosis (ICD10-I70.0). Aortic Atherosclerosis (ICD10-I70.0).  Electronically Signed   By: Jules Schick M.D.   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  However concern for recurrence-FEB 10th, 2024-  Multiple pulmonary nodules in the lungs, new compared to the prior study, concerning for metastatic disease. PET  09/01/2022-The 3 largest pulmonary nodule seen on recent chest CT are hypermetabolic, most consistent with metastatic disease.  Single small hypermetabolic lymph node in the left base of the mesentery is nonspecific. Cannot exclude metastatic disease. No evidence of local recurrence in the neck or other distant metastases in the abdomen or pelvis.  # S/p evaluation with pulmonary/biopsy-negative for malignancy. NavDx testing- APRIL 2024- NEGATIVE. JULY 17th, 2024-  CT Chest-  Interval increase in size of bilateral lung nodules, concerning for metastatic disease.    Patient continues denial of possible recurrence. However, continues to have significant concern for recurrence based on imaging.  I had a long discussion with the with his wife and patient regarding my concern re: repeat Biopsy. Discussed with Dr. Jayme Cloud; awaiting appt in aug 12th. If no Biopsy thenI -recommend a follow-up CT scan in 6 months. Scan ordered. Discussed that chemo would carbo-taxol-keytruda q 3 weeks x4.   # Mild intermittent hypercalcemia PCP -vit 25-OH-28 [FEB 2024]-  ca VIt D.  Multiple myeloma- WNL;  panel kappa, lambda light chain ratio- 1.8; monitor for now. Stable.   # Hearing loss s/p Cisplatin-monitor for now. Stable.   # Hx of CAD- Stable.   I spoke at length with the  patient's family- regarding the patient's clinical status/plan of care.  Family agreement. Offered to speak to daughter, but wife states the daughter will call back with questions.    #Incidental findings on Imaging  CT , JULY 2024:  Stable dilatation of the ascending aorta measuring up to 4.7 x 5.0 cm; Aortic Atherosclerosis; reviewed/discussed/counseled the patient. Recommend follow up with cardiology.   # DISPOSITION: # follow up in 6 months- MD; labs- cbc/cmp; Nav Dx [HPV positive tonsil cancer]-CT prior- - Dr.B  # I reviewed the blood work- with the patient in detail; also reviewed the imaging independently [as summarized above]; and with the patient in detail.    Above plan of care was discussed with patient/family in detail.  My contact information was given to the patient/family.     Earna Coder, MD 01/29/2023 2:00 PM

## 2023-01-29 NOTE — Assessment & Plan Note (Signed)
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  However concern for recurrence-FEB 10th, 2024-  Multiple pulmonary nodules in the lungs, new compared to the prior study, concerning for metastatic disease. PET  09/01/2022-The 3 largest pulmonary nodule seen on recent chest CT are hypermetabolic, most consistent with metastatic disease.  Single small hypermetabolic lymph node in the left base of the mesentery is nonspecific. Cannot exclude metastatic disease. No evidence of local recurrence in the neck or other distant metastases in the abdomen or pelvis.  # S/p evaluation with pulmonary/biopsy-negative for malignancy. NavDx testing- APRIL 2024- NEGATIVE. JULY 17th, 2024-  CT Chest-  Interval increase in size of bilateral lung nodules, concerning for metastatic disease.    Patient continues denial of possible recurrence. However, continues to have significant concern for recurrence based on imaging.  I had a long discussion with the with his wife and patient regarding my concern re: repeat Biopsy. Discussed with Dr. Jayme Cloud; awaiting appt in aug 12th. If no Biopsy thenI -recommend a follow-up CT scan in 6 months. Scan ordered. Discussed that chemo would carbo-taxol-keytruda q 3 weeks x4.   # Mild intermittent hypercalcemia PCP -vit 25-OH-28 [FEB 2024]-  ca VIt D.  Multiple myeloma- WNL;  panel kappa, lambda light chain ratio- 1.8; monitor for now. Stable.   # Hearing loss s/p Cisplatin-monitor for now. Stable.   # Hx of CAD- Stable.   I spoke at length with the patient's family- regarding the patient's clinical status/plan of care.  Family agreement. Offered to speak to daughter, but wife states the daughter will call back with questions.    #Incidental findings on Imaging  CT , JULY 2024:  Stable dilatation of the ascending aorta measuring up to 4.7 x 5.0 cm; Aortic Atherosclerosis; reviewed/discussed/counseled the patient. Recommend  follow up with cardiology.   # DISPOSITION: # follow up in 6 months- MD; labs- cbc/cmp; Nav Dx [HPV positive tonsil cancer]-CT prior- - Dr.B  # I reviewed the blood work- with the patient in detail; also reviewed the imaging independently [as summarized above]; and with the patient in detail.

## 2023-01-29 NOTE — Progress Notes (Signed)
Results of scans.  Wants to know why he is still having to come here?

## 2023-02-02 ENCOUNTER — Inpatient Hospital Stay: Payer: Medicare HMO

## 2023-02-05 ENCOUNTER — Inpatient Hospital Stay: Payer: Medicare HMO

## 2023-02-12 ENCOUNTER — Ambulatory Visit: Payer: Medicare HMO | Admitting: Pulmonary Disease

## 2023-05-04 ENCOUNTER — Other Ambulatory Visit: Payer: Self-pay | Admitting: Physician Assistant

## 2023-05-04 DIAGNOSIS — I10 Essential (primary) hypertension: Secondary | ICD-10-CM

## 2023-05-31 ENCOUNTER — Other Ambulatory Visit: Payer: Self-pay | Admitting: Family Medicine

## 2023-05-31 DIAGNOSIS — I10 Essential (primary) hypertension: Secondary | ICD-10-CM

## 2023-06-11 ENCOUNTER — Ambulatory Visit: Payer: Self-pay

## 2023-06-11 ENCOUNTER — Encounter: Payer: Self-pay | Admitting: Family Medicine

## 2023-06-11 ENCOUNTER — Ambulatory Visit (INDEPENDENT_AMBULATORY_CARE_PROVIDER_SITE_OTHER): Payer: Medicare HMO | Admitting: Family Medicine

## 2023-06-11 VITALS — BP 139/73 | HR 81 | Temp 98.3°F | Wt 232.0 lb

## 2023-06-11 DIAGNOSIS — J0191 Acute recurrent sinusitis, unspecified: Secondary | ICD-10-CM

## 2023-06-11 MED ORDER — AMOXICILLIN 875 MG PO TABS: 875.0000 mg | ORAL_TABLET | Freq: Two times a day (BID) | ORAL | 0 refills | Status: AC

## 2023-06-11 NOTE — Telephone Encounter (Signed)
     Chief Complaint: Productive cough with green mucus.Wheezing Symptoms: Above Frequency: 1 week Pertinent Negatives: Patient denies fever Disposition: [] ED /[] Urgent Care (no appt availability in office) / [x] Appointment(In office/virtual)/ []  [facility]/ [] Home Care/ [] Refused Recommended Disposition /[] [facility]/ []  Follow-up with PCP Additional Notes: Agrees with appointment.  Reason for Disposition  SEVERE coughing spells (e.g., whooping sound after coughing, vomiting after coughing)  Answer Assessment - Initial Assessment Questions 1. ONSET: "When did the cough begin?"      1 week 2. SEVERITY: "How bad is the cough today?"      Severe 3. SPUTUM: "Describe the color of your sputum" (none, dry cough; clear, white, yellow, green)     Green-yellow 4. HEMOPTYSIS: "Are you coughing up any blood?" If so ask: "How much?" (flecks, streaks, tablespoons, etc.)     No 5. DIFFICULTY BREATHING: "Are you having difficulty breathing?" If Yes, ask: "How bad is it?" (e.g., mild, moderate, severe)    - MILD: No SOB at rest, mild SOB with walking, speaks normally in sentences, can lie down, no retractions, pulse < 100.    - MODERATE: SOB at rest, SOB with minimal exertion and prefers to sit, cannot lie down flat, speaks in phrases, mild retractions, audible wheezing, pulse 100-120.    - SEVERE: Very SOB at rest, speaks in single words, struggling to breathe, sitting hunched forward, retractions, pulse > 120      No 6. FEVER: "Do you have a fever?" If Yes, ask: "What is your temperature, how was it measured, and when did it start?"     No 7. CARDIAC HISTORY: "Do you have any history of heart disease?" (e.g., heart attack, congestive heart failure)      Yes 8. LUNG HISTORY: "Do you have any history of lung disease?"  (e.g., pulmonary embolus, asthma, emphysema)     No 9. PE RISK FACTORS: "Do you have a history of blood clots?" (or: recent major surgery, recent prolonged  travel, bedridden)     No 10. OTHER SYMPTOMS: "Do you have any other symptoms?" (e.g., runny nose, wheezing, chest pain)       Wheezing 11. PREGNANCY: "Is there any chance you are pregnant?" "When was your last menstrual period?"       N/a 12. TRAVEL: "Have you traveled out of the country in the last month?" (e.g., travel history, exposures)       No  Protocols used: Cough - Acute Productive-A-AH

## 2023-06-11 NOTE — Progress Notes (Signed)
Established patient visit   Patient: Omar Christian   DOB: 1955/12/29   67 y.o. Male  MRN: [id number] Visit Date: 06/11/2023  Today's healthcare provider: Mila Merry, MD   Chief Complaint  Patient presents with   Cough    Productive cough with green mucus associated with wheezing X 1 week. Reports cough to be severe with no fever and cardiac hx. Patient reports he is getting a little better but he reports a hx of this happeneing every year and it continues to come back if he does not get abx. Reports taking mucinex extra strength but no assistance. Fever and chills the first few days. Worried cold in chest will turn into walking pneumonia.    Subjective    Discussed the use of AI scribe software for clinical note transcription with the patient, who gave verbal consent to proceed.  History of Present Illness   The patient, with a history of recurrent sinus, presents with a seven-day history of symptoms suggestive of an upper respiratory tract infection. The symptoms began w week ago have since progressed. The patient reports productive cough with phlegm expectoration, which he has been managing by spitting into a tissue. He also experienced fever, chills, and sweats for the first two to three days of the illness, which subsequently resolved. The patient denies any significant shortness of breath or difficulty breathing.  The patient has a history of similar episodes, typically occurring annually around this time of year. He recalls a particularly severe episode last year, which required a hospital visit on Christmas Eve and resulted in prolonged suffering. The patient has previously been treated with amoxicillin, which he reports as being effective when given in a high dose. He specifically recalls a large, pink pill, which he believes to be a high-dose amoxicillin, as being particularly effective.  The patient denies any recent COVID-19 testing but reports having received the  COVID-19 vaccine. He also reports a history of poor hearing, but denies any current ear pain. He denies any nasal congestion.       Medications: Outpatient Medications Prior to Visit  Medication Sig   colchicine 0.6 MG tablet Day 1: 1.6 mg ( 2 tablets) for the first dose, and then 0.6 mg ( 1 tablet) in 1 hour. After day 1, take 0.6 mg twice daily for three days or until symptoms resolve (Patient not taking: Reported on 06/11/2023)   losartan-hydrochlorothiazide (HYZAAR) 50-12.5 MG tablet Take 1 tablet by mouth daily. NEED appointment (Patient not taking: Reported on 06/11/2023)   No facility-administered medications prior to visit.   Review of Systems     Objective    BP 139/73 (BP Location: Left Arm, Patient Position: Sitting, Cuff Size: Large)   Pulse 81   Temp 98.3 F (36.8 C) (Oral)   Wt 232 lb (105.2 kg)   BMI 30.61 kg/m    Physical Exam   HEENT: Ears without tenderness. Nasal passages patent. Oral cavity without erythema or exudates. CHEST: Abnormal lung sounds consistent with bronchitis.    No results found for any visits on 06/11/23.  Assessment & Plan        Upper Respiratory Infection/sinusitus Symptoms for 7 days with productive cough and previous fever. No significant shortness of breath. History of similar episodes, usually responsive to high-dose Amoxicillin. -Prescribe Amoxicillin 875mg  twice daily. -Advise patient to take medication as directed and seek medical attention if symptoms worsen or do not improve.    No follow-ups on file.  Mila Merry, MD  [facility] [phone] (phone) [phone] (fax)  [facility]

## 2023-06-15 ENCOUNTER — Ambulatory Visit: Payer: Medicare HMO | Admitting: Family Medicine

## 2023-07-03 ENCOUNTER — Other Ambulatory Visit: Payer: Self-pay | Admitting: Family Medicine

## 2023-07-03 DIAGNOSIS — I10 Essential (primary) hypertension: Secondary | ICD-10-CM

## 2023-07-31 ENCOUNTER — Encounter: Payer: Self-pay | Admitting: Family Medicine

## 2023-07-31 ENCOUNTER — Other Ambulatory Visit: Payer: Self-pay | Admitting: Family Medicine

## 2023-07-31 ENCOUNTER — Ambulatory Visit (INDEPENDENT_AMBULATORY_CARE_PROVIDER_SITE_OTHER): Payer: Medicare HMO | Admitting: Family Medicine

## 2023-07-31 VITALS — BP 159/77 | HR 89 | Ht 73.0 in | Wt 227.7 lb

## 2023-07-31 DIAGNOSIS — J069 Acute upper respiratory infection, unspecified: Secondary | ICD-10-CM

## 2023-07-31 DIAGNOSIS — I10 Essential (primary) hypertension: Secondary | ICD-10-CM | POA: Diagnosis not present

## 2023-07-31 DIAGNOSIS — R053 Chronic cough: Secondary | ICD-10-CM

## 2023-07-31 DIAGNOSIS — R0989 Other specified symptoms and signs involving the circulatory and respiratory systems: Secondary | ICD-10-CM

## 2023-07-31 MED ORDER — DOXYCYCLINE HYCLATE 100 MG PO TABS: 100.0000 mg | ORAL_TABLET | Freq: Two times a day (BID) | ORAL | 0 refills | Status: AC

## 2023-07-31 NOTE — Progress Notes (Signed)
Acute visit   Patient: Omar Christian   DOB: 07-07-1955   68 y.o. Male  MRN: [id number]  Chief Complaint  Patient presents with   Cough    Since Chrsitams and a litle better but the worse when getting up for about the first 2 hours. Reports yellowish -green phlegm at first and becomes bettr throughtout the day, mucinex extra strebgth (2 boxes). Also reports having night sweats and felt a little better afterwords but has not resolved. Would like to see if he could have anything stronger than the 875 mg amoxicillian . No fever that he is aware of   Subjective    Discussed the use of AI scribe software for clinical note transcription with the patient, who gave verbal consent to proceed.  History of Present Illness   The patient, with a history of tonsillar cancer and hypertension, presents with a persistent cough and phlegm production that has been ongoing since before Christmas. Despite a course of antibiotics prescribed on December 9th, the symptoms have not fully resolved. The patient reports that usually, after taking three or four pills of antibiotics, the symptoms would subside, but this time it was not the case. The patient did experience a slight improvement but not a complete resolution of symptoms. The patient's worst symptoms occur in the morning, taking about two hours to clear the congestion. The patient denies any upper respiratory congestion and has not had any fevers, although he did experience chills one night. The patient has not been on treatment for tonsillar cancer for over a year. The patient also has a history of high blood pressure but has not been taking his medication.        Review of Systems  Objective    BP (!) 159/77 (BP Location: Left Arm, Patient Position: Sitting, Cuff Size: Normal)   Pulse 89   Ht 6\' 1"  (1.854 m)   Wt 227 lb 11.2 oz (103.3 kg)   SpO2 100%   BMI 30.04 kg/m  Physical Exam Vitals reviewed.  Constitutional:      General: He is not  in acute distress.    Appearance: Normal appearance. He is not diaphoretic.  HENT:     Head: Normocephalic and atraumatic.  Eyes:     General: No scleral icterus.    Conjunctiva/sclera: Conjunctivae normal.  Cardiovascular:     Rate and Rhythm: Normal rate and regular rhythm.     Heart sounds: Normal heart sounds. No murmur heard. Pulmonary:     Effort: Pulmonary effort is normal. No respiratory distress.     Breath sounds: Normal breath sounds. No wheezing or rhonchi.  Musculoskeletal:     Cervical back: Neck supple.     Right lower leg: No edema.     Left lower leg: No edema.  Lymphadenopathy:     Cervical: No cervical adenopathy.  Skin:    General: Skin is warm and dry.     Findings: No rash.  Neurological:     Mental Status: He is alert and oriented to person, place, and time. Mental status is at baseline.  Psychiatric:        Mood and Affect: Mood normal.        Behavior: Behavior normal.       No results found for any visits on 07/31/23.  Assessment & Plan     Problem List Items Addressed This Visit       Cardiovascular and Mediastinum   Benign hypertension  Other Visit Diagnoses       Upper respiratory tract infection, unspecified type    -  Primary       Assessment and Plan    Chronic Cough with Congestion Persistent cough and chest congestion since before Christmas, not fully resolved with previous amoxicillin. Symptoms include morning congestion, productive cough with phlegm, and one episode of chills and night sweats. No fever. Lungs clear on auscultation. Differential includes chronic bronchitis, post-infectious cough, or unresolved bacterial infection. Discussed doxycycline, effective for respiratory infections, with potential gastrointestinal upset. Emphasized completing the full course to prevent resistance. - Prescribe doxycycline 100 mg, one pill twice a day for 7 days - Has CT scan of the lungs scheduled for tomorrow which will also evaluate for  pneumonia or other pathology  Hypertension Elevated blood pressure noted. Non-adherence to prescribed losartan HCTZ. Discussed importance of blood pressure control to reduce cardiovascular and renal risks. History of open-heart surgery, no recent myocardial infarction. Emphasized resuming medication and follow-up for dose adjustment. - Resume losartan HCTZ - Follow up with primary care physician (Dr. Payton Mccallum) in one month to monitor blood pressure and adjust medication if necessary  General Health Maintenance Routine follow-up imaging for tonsillar cancer. CT scan of the lungs scheduled to monitor for changes or growths in lung nodules. Explained CT scan's comprehensiveness over chest x-ray for early detection. - Attend CT scan appointment at outpatient imaging center - Coordinate with primary care physician for ongoing health maintenance  Follow-up - Follow up with primary care physician (Dr. Payton Mccallum) in one month - Attend CT scan appointment tomorrow at 12:30 PM.         Meds ordered this encounter  Medications   doxycycline (VIBRA-TABS) 100 MG tablet    Sig: Take 1 tablet (100 mg total) by mouth 2 (two) times daily for 7 days.    Dispense:  14 tablet    Refill:  0     Return in about 4 weeks (around 08/28/2023) for BP f/u, With PCP.      Shirlee Latch, MD  [facility] [phone] (phone) [phone] (fax)  [facility]

## 2023-08-01 ENCOUNTER — Ambulatory Visit
Admission: RE | Admit: 2023-08-01 | Discharge: 2023-08-01 | Disposition: A | Discharge status: Home / Self Care | Payer: Medicare HMO | Source: Ambulatory Visit | Attending: Internal Medicine | Admitting: Internal Medicine

## 2023-08-01 DIAGNOSIS — C099 Malignant neoplasm of tonsil, unspecified: Secondary | ICD-10-CM | POA: Diagnosis present

## 2023-08-01 DIAGNOSIS — R918 Other nonspecific abnormal finding of lung field: Secondary | ICD-10-CM | POA: Insufficient documentation

## 2023-08-01 MED ORDER — IOHEXOL 300 MG/ML  SOLN
75.0000 mL | Freq: Once | INTRAMUSCULAR | Status: AC | PRN
  Administered 2023-08-01: 75 mL via INTRAVENOUS

## 2023-08-08 ENCOUNTER — Encounter: Payer: Self-pay | Admitting: Internal Medicine

## 2023-08-08 ENCOUNTER — Encounter: Payer: Self-pay | Admitting: Hematology and Oncology

## 2023-08-08 ENCOUNTER — Inpatient Hospital Stay: Payer: Medicare Other | Attending: Internal Medicine

## 2023-08-08 ENCOUNTER — Inpatient Hospital Stay: Payer: Medicare Other | Admitting: Internal Medicine

## 2023-08-08 DIAGNOSIS — C109 Malignant neoplasm of oropharynx, unspecified: Secondary | ICD-10-CM | POA: Diagnosis not present

## 2023-08-08 DIAGNOSIS — I2510 Atherosclerotic heart disease of native coronary artery without angina pectoris: Secondary | ICD-10-CM | POA: Insufficient documentation

## 2023-08-08 DIAGNOSIS — Z85819 Personal history of malignant neoplasm of unspecified site of lip, oral cavity, and pharynx: Secondary | ICD-10-CM | POA: Diagnosis not present

## 2023-08-08 DIAGNOSIS — C099 Malignant neoplasm of tonsil, unspecified: Secondary | ICD-10-CM | POA: Diagnosis not present

## 2023-08-08 DIAGNOSIS — Z9221 Personal history of antineoplastic chemotherapy: Secondary | ICD-10-CM | POA: Insufficient documentation

## 2023-08-08 DIAGNOSIS — Z85818 Personal history of malignant neoplasm of other sites of lip, oral cavity, and pharynx: Secondary | ICD-10-CM | POA: Diagnosis not present

## 2023-08-08 DIAGNOSIS — R918 Other nonspecific abnormal finding of lung field: Secondary | ICD-10-CM | POA: Diagnosis not present

## 2023-08-08 DIAGNOSIS — Z800 Family history of malignant neoplasm of digestive organs: Secondary | ICD-10-CM | POA: Diagnosis not present

## 2023-08-08 DIAGNOSIS — H9190 Unspecified hearing loss, unspecified ear: Secondary | ICD-10-CM | POA: Diagnosis not present

## 2023-08-08 LAB — CMP (CANCER CENTER ONLY)
ALT: 24 U/L (ref 0–44)
AST: 24 U/L (ref 15–41)
Albumin: 4.2 g/dL (ref 3.5–5.0)
Alkaline Phosphatase: 62 U/L (ref 38–126)
Anion gap: 10 (ref 5–15)
BUN: 16 mg/dL (ref 8–23)
CO2: 28 mmol/L (ref 22–32)
Calcium: 9.7 mg/dL (ref 8.9–10.3)
Chloride: 97 mmol/L — ABNORMAL LOW (ref 98–111)
Creatinine: 1.36 mg/dL — ABNORMAL HIGH (ref 0.61–1.24)
GFR, Estimated: 57 mL/min — ABNORMAL LOW (ref >60)
Glucose, Bld: 120 mg/dL — ABNORMAL HIGH (ref 70–99)
Potassium: 4.3 mmol/L (ref 3.5–5.1)
Sodium: 135 mmol/L (ref 135–145)
Total Bilirubin: 1 mg/dL (ref 0.0–1.2)
Total Protein: 7.2 g/dL (ref 6.5–8.1)

## 2023-08-08 LAB — CBC WITH DIFFERENTIAL (CANCER CENTER ONLY)
Abs Immature Granulocytes: 0.02 10*3/uL (ref 0.00–0.07)
Basophils Absolute: 0 10*3/uL (ref 0.0–0.1)
Basophils Relative: 1 %
Eosinophils Absolute: 0.1 10*3/uL (ref 0.0–0.5)
Eosinophils Relative: 2 %
HCT: 41.8 % (ref 39.0–52.0)
Hemoglobin: 14.5 g/dL (ref 13.0–17.0)
Immature Granulocytes: 0 %
Lymphocytes Relative: 19 %
Lymphs Abs: 1 10*3/uL (ref 0.7–4.0)
MCH: 30.6 pg (ref 26.0–34.0)
MCHC: 34.7 g/dL (ref 30.0–36.0)
MCV: 88.2 fL (ref 80.0–100.0)
Monocytes Absolute: 0.4 10*3/uL (ref 0.1–1.0)
Monocytes Relative: 7 %
Neutro Abs: 3.9 10*3/uL (ref 1.7–7.7)
Neutrophils Relative %: 71 %
Platelet Count: 184 10*3/uL (ref 150–400)
RBC: 4.74 MIL/uL (ref 4.22–5.81)
RDW: 13.9 % (ref 11.5–15.5)
WBC Count: 5.4 10*3/uL (ref 4.0–10.5)
nRBC: 0 % (ref 0.0–0.2)

## 2023-08-08 NOTE — Progress Notes (Signed)
 CT chest 08/01/23.

## 2023-08-08 NOTE — Progress Notes (Signed)
 [facility] CONSULT NOTE  Patient Care Team: Pardue, Lauraine SAILOR, DO as PCP - General (Family Medicine) [employer] Dallas NOVAK, MD (Inactive) as Consulting Physician (Cardiothoracic Surgery) Ammon Blunt, MD as Consulting Physician (Cardiology) Odean Potts, MD as Consulting Physician (Hematology and Oncology) Izell Lauraine, MD as Attending Physician (Radiation Oncology) [name], Delon CROME, RN as Oncology Nurse Navigator [name], Lupita NOVAK, CCC-SLP as Speech Language Pathologist (Speech Pathology) Cyndee Tanda FALCON, DDS (Inactive) as Consulting Physician (Dentistry) Lanis Carbon, Florina CROME, PT as Physical Therapist (Physical Therapy) Stacia Arlean BROCKS, LCSW (Inactive) as Social Worker (General Practice) Elena, Lavanda SQUIBB, LCSW (Inactive) as Social Worker Daryle Heron CROME, RD as Dietitian (Nutrition) Rennie Cindy SAUNDERS, MD as Consulting Physician (Oncology) Rennie Cindy SAUNDERS, MD as Consulting Physician (Oncology)  CHIEF COMPLAINTS/PURPOSE OF CONSULTATION: tonsil cancer; and lung nodules.   Oncology History Overview Note  # SEP 2021- HPV positive- stage III/IV- cisplatin -RT [Dr.Gudena/[facility]]-  # FEB 2024- Multiple pulmonary nodules are noted in the lungs bilaterally, new compared to the prior study, largest of which is in the posteromedial aspect of the superior segment of the left lower lobe (axial image 70 of series 4) measuring 1.7 x 1.3 cm. Other prominent nodules include a 10 x 9 mm nodule in the posteromedial right lower lobe (axial image 70 of series 4), and a 8 mm nodule in the left upper lobe (axial image 44 of series 4).     Tonsillar cancer (HCC)  01/14/2020 Initial Diagnosis   Squamous cell carcinoma with basaloid features   01/26/2020 PET scan   Right tonsillar mass extending to bilateral tongue base 4.9 cm.  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  Accompanied by his wife.   Tanda CROME Collum 68 y.o.  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  Remains with no symptoms.  Appetite is great. Energy is good.  Denies any nausea vomiting abdominal pain.  Review of Systems  Constitutional:  Negative for chills, diaphoresis, fever, malaise/fatigue and weight loss.  HENT:  Negative for nosebleeds and sore throat.   Eyes:  Negative for double vision.  Respiratory:  Negative for cough, hemoptysis, sputum production, shortness of breath and wheezing.   Cardiovascular:  Negative for chest pain, palpitations, orthopnea and leg swelling.  Gastrointestinal:  Negative for abdominal pain, blood in stool, constipation, diarrhea, heartburn, melena, nausea and vomiting.  Genitourinary:  Negative for dysuria, frequency and urgency.  Musculoskeletal:  Negative for back pain and joint pain.  Skin: Negative.  Negative for itching and rash.  Neurological:  Negative for dizziness, tingling, focal weakness, weakness and headaches.  Endo/Heme/Allergies:  Does not bruise/bleed easily.  Psychiatric/Behavioral:  Negative for depression. The patient is not nervous/anxious and does not have  insomnia.     MEDICAL HISTORY:  Past Medical History:  Diagnosis Date   Abnormal findings on cardiac catheterization 09/30/2015   [facility]   Abnormal myocardial perfusion study 09/24/2015   [facility]   CAD, multiple vessel 09/30/2015   per CATH @ [facility]/DR.PARACHOS   Cancer (HCC)    Coronary artery disease    GERD (gastroesophageal reflux disease)    Gout    H/O echocardiogram 09/24/2015   [facility]   Hard of hearing    History of pneumonia    Hyperlipidemia    Hypertension    Pneumonia    Progressive angina (HCC)    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    SURGICAL HISTORY: Past Surgical History:  Procedure Laterality Date   CARDIAC CATHETERIZATION N/A 09/30/2015   Procedure: Left Heart Cath and Coronary Angiography;  Surgeon: Marsa Dooms, MD;  Location: [facility] INVASIVE CV LAB;  Service: Cardiovascular;  Laterality: N/A;   CORONARY ARTERY BYPASS GRAFT N/A 10/08/2015   Procedure: CORONARY ARTERY BYPASS GRAFTING time 4 using left internal mammary and right greater saphenous vein harvested by endovein;  Surgeon: Dallas KATHEE Jude, MD;  Location: [facility] OR;  Service: Open Heart Surgery;  Laterality: N/A;   CORONARY ARTERY BYPASS GRAFT     ENDOBRONCHIAL ULTRASOUND Left 09/27/2022   Procedure: ENDOBRONCHIAL ULTRASOUND;  Surgeon: Tamea Dedra CROME, MD;  Location: [facility] ORS;  Service: Pulmonary;  Laterality: Left;   IR GASTROSTOMY TUBE MOD SED  02/10/2020   IR GASTROSTOMY TUBE REMOVAL  10/12/2020   IR IMAGING GUIDED PORT INSERTION  02/10/2020   IR REMOVAL TUN ACCESS W/ PORT W/O FL MOD SED  10/12/2020   MULTIPLE EXTRACTIONS WITH ALVEOLOPLASTY N/A 02/04/2020   Procedure: Extraction of tooth #'s [phone], and 32 with alveoloplasty and gross debridement of remaining teeth.;  Surgeon: Cyndee Tanda FALCON, DDS;  Location: [facility] OR;  Service: Oral Surgery;  Laterality: N/A;   TEE WITHOUT CARDIOVERSION N/A 10/08/2015   Procedure: TRANSESOPHAGEAL ECHOCARDIOGRAM  (TEE);  Surgeon: Dallas KATHEE Jude, MD;  Location: [facility] OR;  Service: Open Heart Surgery;  Laterality: N/A;   VASECTOMY     WISDOM TOOTH EXTRACTION      SOCIAL HISTORY: Social History   Socioeconomic History   Marital status: Married    Spouse name: Elveria   Number of children: 2   Years of education: Not on file   Highest education level: Not on file  Occupational History   Not on file  Tobacco Use   Smoking status: Never   Smokeless tobacco: Never  Vaping Use   Vaping status: Never Used  Substance and Sexual Activity   Alcohol use: No   Drug use: No   Sexual activity: Not Currently    Birth control/protection: Surgical    Comment: Vasectomy  Other Topics Concern   Not on file  Social History Narrative   Not on file   Social Drivers of Health   Financial Resource Strain: Low Risk  (10/10/2022)   Overall Financial Resource Strain (CARDIA)    Difficulty of Paying Living Expenses: Not very hard  Food Insecurity: No Food Insecurity (10/10/2022)   Hunger Vital Sign    Worried About Running Out of Food in the Last Year: Never true    Ran Out of Food in the Last Year: Never true  Transportation Needs: No  Transportation Needs (10/10/2022)   PRAPARE - Administrator, Civil Service (Medical): No    Lack of Transportation (Non-Medical): No  Physical Activity: Insufficiently Active (10/10/2022)   Exercise Vital Sign    Days of Exercise per Week: 3 days    Minutes of Exercise per Session: 30 min  Stress: No Stress Concern Present (10/10/2022)   [employer] of Occupational Health - Occupational Stress Questionnaire    Feeling of Stress : Not at all  Social Connections: Moderately Integrated (10/10/2022)   Social Connection and Isolation Panel [NHANES]    Frequency of Communication with Friends and Family: Three times a week    Frequency of Social Gatherings with Friends and Family: Never    Attends Religious Services: More than 4 times per year    Active Member of  [employer] or Organizations: No    Attends Banker Meetings: Never    Marital Status: Married  Catering Manager Violence: Not At Risk (10/10/2022)   Humiliation, Afraid, Rape, and Kick questionnaire    Fear of Current or Ex-Partner: No    Emotionally Abused: No    Physically Abused: No    Sexually Abused: No    FAMILY HISTORY: Family History  Problem Relation Age of Onset   Heart disease Mother    Hypertension Mother    Alcohol abuse Father    Throat cancer Father    Muscular dystrophy Brother    Muscular dystrophy Brother     ALLERGIES:  has no known allergies.  MEDICATIONS:  Current Outpatient Medications  Medication Sig Dispense Refill   losartan -hydrochlorothiazide (HYZAAR) 50-12.5 MG tablet TAKE 1 TABLET BY MOUTH EVERY DAY NEED APPOINTMENT 90 tablet 0   No current facility-administered medications for this visit.    PHYSICAL EXAMINATION:   Vitals:   08/08/23 1314  BP: (!) 141/85  Pulse: 87  Temp: 98.2 F (36.8 C)  SpO2: 98%   Filed Weights   08/08/23 1314  Weight: 227 lb 9.6 oz (103.2 kg)    Physical Exam Vitals and nursing note reviewed.  HENT:     Head: Normocephalic and atraumatic.     Mouth/Throat:     Pharynx: Oropharynx is clear.  Eyes:     Extraocular Movements: Extraocular movements intact.     Pupils: Pupils are equal, round, and reactive to light.  Cardiovascular:     Rate and Rhythm: Normal rate and regular rhythm.  Pulmonary:     Comments: Decreased breath sounds bilaterally.  Abdominal:     Palpations: Abdomen is soft.  Musculoskeletal:        General: Normal range of motion.     Cervical back: Normal range of motion.  Skin:    General: Skin is warm.  Neurological:     General: No focal deficit present.     Mental Status: He is alert and oriented to person, place, and time.  Psychiatric:        Behavior: Behavior normal.        Judgment: Judgment normal.     LABORATORY DATA:  I have reviewed the data as listed Lab  Results  Component Value Date   WBC 5.4 08/08/2023   HGB 14.5 08/08/2023   HCT 41.8 08/08/2023   MCV 88.2 08/08/2023   PLT 184 08/08/2023   Recent Labs    08/24/22 1149 10/17/22 1344 01/29/23 1258 08/08/23 1257  NA 136 136 139 135  K 4.0 4.8 3.8 4.3  CL 104 98 104 97*  CO2 24  23 25 28   GLUCOSE 116* 169* 111* 120*  BUN 13 11 22 16   CREATININE 1.14 1.27 1.25* 1.36*  CALCIUM  9.3 9.5 9.0 9.7  GFRNONAA >60  --  >60 57*  PROT 7.1 6.4 7.2 7.2  ALBUMIN  4.2 4.4 4.1 4.2  AST 28 18 24 24   ALT 30 25 28 24   ALKPHOS 64 83 60 62  BILITOT 0.8 0.6 0.9 1.0    RADIOGRAPHIC STUDIES: I have personally reviewed the radiological images as listed and agreed with the findings in the report. No results found.   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  However concern for recurrence-FEB 10th, 2024-  Multiple pulmonary nodules in the lungs, new compared to the prior study, concerning for metastatic disease. PET  09/01/2022-The 3 largest pulmonary nodule seen on recent chest CT are hypermetabolic, most consistent with metastatic disease.  Single small hypermetabolic lymph node in the left base of the mesentery is nonspecific. Cannot exclude metastatic disease.   # S/p evaluation with pulmonary/biopsy-negative for malignancy. NavDx testing- APRIL 2024- NEGATIVE. JAN 29th, 2025-  CT Chest-  Interval increase in size of bilateral lung nodules, concerning for metastatic disease. Discussed with patient and wife.     Patient continues denial of possible recurrence. However, continues to have significant concern for recurrence based on imaging.  Patient continues to deny any further workup for/bronchoscopy or chemotherapy.  # Given patient's in general reluctance with any diagnostic or therapeutic options I would recommend a follow-up CT scan in 6 months. Scan ordered. Discussed that chemo would  carbo-taxol-keytruda q 3 weeks x4.   # Mild intermittent hypercalcemia PCP -vit 25-OH-28 [FEB 2024]-  ca VIt D.  Multiple myeloma- WNL;  panel kappa, lambda light chain ratio- 1.8; monitor for now. Stable.   # Hearing loss s/p Cisplatin -monitor for now. Stable.   # Hx of CAD- Stable.   I spoke at length with the patient's family- regarding the patient's clinical status/plan of care.  Family agreement. Offered to speak to daughter, but wife states the daughter will call back with questions.    #Incidental findings on Imaging  CT , JULY 2024:  Stable dilatation of the ascending aorta measuring up to 4.7 x 5.0 cm; Aortic Atherosclerosis; reviewed/discussed/counseled the patient. Recommend follow up with cardiology.   # DISPOSITION: # follow up in 6 months- MD; labs- cbc/cmp; Nav Dx [HPV positive tonsil cancer]-CT prior- - Dr.B  # I reviewed the blood work- with the patient in detail; also reviewed the imaging independently [as summarized above]; and with the patient in detail.    Above plan of care was discussed with patient/family in detail.  My contact information was given to the patient/family.     Cindy JONELLE Joe, MD 08/08/2023 2:08 PM

## 2023-08-08 NOTE — Assessment & Plan Note (Addendum)
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  However concern for recurrence-FEB 10th, 2024-  Multiple pulmonary nodules in the lungs, new compared to the prior study, concerning for metastatic disease. PET  09/01/2022-The 3 largest pulmonary nodule seen on recent chest CT are hypermetabolic, most consistent with metastatic disease.  Single small hypermetabolic lymph node in the left base of the mesentery is nonspecific. Cannot exclude metastatic disease.   # S/p evaluation with pulmonary/biopsy-negative for malignancy. NavDx testing- APRIL 2024- NEGATIVE. JAN 29th, 2025-  CT Chest-  Interval increase in size of bilateral lung nodules, concerning for metastatic disease. Discussed with patient and wife.     Patient continues denial of possible recurrence. However, continues to have significant concern for recurrence based on imaging.  Patient continues to deny any further workup for/bronchoscopy or chemotherapy.  # Given patient's in general reluctance with any diagnostic or therapeutic options I would recommend a follow-up CT scan in 6 months. Scan ordered. Discussed that chemo would carbo-taxol-keytruda q 3 weeks x4.   # Mild intermittent hypercalcemia PCP -vit 25-OH-28 [FEB 2024]-  ca VIt D.  Multiple myeloma- WNL;  panel kappa, lambda light chain ratio- 1.8; monitor for now. Stable.   # Hearing loss s/p Cisplatin -monitor for now. Stable.   # Hx of CAD- Stable.   I spoke at length with the patient's family- regarding the patient's clinical status/plan of care.  Family agreement. Offered to speak to daughter, but wife states the daughter will call back with questions.    #Incidental findings on Imaging  CT , JULY 2024:  Stable dilatation of the ascending aorta measuring up to 4.7 x 5.0 cm; Aortic Atherosclerosis; reviewed/discussed/counseled the patient. Recommend follow up with cardiology.   # DISPOSITION: # follow up in 6  months- MD; labs- cbc/cmp; Nav Dx [HPV positive tonsil cancer]-CT prior- - Dr.B  # I reviewed the blood work- with the patient in detail; also reviewed the imaging independently [as summarized above]; and with the patient in detail.

## 2023-08-09 ENCOUNTER — Encounter: Payer: Self-pay | Admitting: *Deleted

## 2023-08-16 ENCOUNTER — Encounter: Payer: Self-pay | Admitting: Hematology and Oncology

## 2023-08-21 ENCOUNTER — Encounter: Payer: Self-pay | Admitting: Internal Medicine

## 2023-08-28 ENCOUNTER — Ambulatory Visit (INDEPENDENT_AMBULATORY_CARE_PROVIDER_SITE_OTHER): Payer: Medicare Other | Admitting: Family Medicine

## 2023-08-28 ENCOUNTER — Encounter: Payer: Self-pay | Admitting: Family Medicine

## 2023-08-28 VITALS — BP 125/74 | HR 81 | Ht 73.0 in | Wt 227.3 lb

## 2023-08-28 DIAGNOSIS — I10 Essential (primary) hypertension: Secondary | ICD-10-CM | POA: Diagnosis not present

## 2023-08-28 DIAGNOSIS — C099 Malignant neoplasm of tonsil, unspecified: Secondary | ICD-10-CM | POA: Diagnosis not present

## 2023-08-28 DIAGNOSIS — R918 Other nonspecific abnormal finding of lung field: Secondary | ICD-10-CM | POA: Diagnosis not present

## 2023-08-28 MED ORDER — LOSARTAN POTASSIUM-HCTZ 50-12.5 MG PO TABS: 1.0000 | ORAL_TABLET | Freq: Every day | ORAL | 1 refills | Status: DC

## 2023-08-28 NOTE — Assessment & Plan Note (Deleted)
 Hypertension is managed with losartan-hydrochlorothiazide. He reports no issues with chest pain, shortness of breath, headaches, vision changes, lightheadedness, or dizziness. Blood pressure is well controlled on the current regimen. - Continue losartan hydrochlorothiazide - Send another 90-day supply [facility] [place]

## 2023-08-28 NOTE — Assessment & Plan Note (Signed)
 Hypertension is managed with losartan-hydrochlorothiazide. He reports no issues with chest pain, shortness of breath, headaches, vision changes, lightheadedness, or dizziness. Blood pressure is well controlled on the current regimen. - Continue losartan hydrochlorothiazide - Send another 90-day supply [facility] [place]

## 2023-08-28 NOTE — Assessment & Plan Note (Signed)
 Two lung nodules are being monitored by oncology. Previous biopsy was inconclusive, and blood tests have shown indeterminate results. He has a history of cancer treated with chemotherapy and radiation in 2021. Current nodules are suspected metastases from the original cancer. He has declined further invasive diagnostic procedures and treatment at this time. Discussed that without treatment, cancer will progress and eventually cause death. Counseled patient that by the time cancer causes symptoms, it may be too late for effective treatment; he expressed understanding. - Follow up with oncology in six months - Advise to contact oncology sooner if he decides to pursue treatment

## 2023-08-28 NOTE — Assessment & Plan Note (Addendum)
 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  Patient declined to pursue treatment due to "feeling fine."  Addressed as noted above.

## 2023-08-28 NOTE — Progress Notes (Signed)
 Established patient visit   Patient: Omar Christian   DOB: 1956-05-21   68 y.o. Male  MRN: [id number] Visit Date: 08/28/2023  Today's healthcare provider: Sherlyn Hay, DO   Chief Complaint  Patient presents with   Hypertension   Subjective    HPI Omar Christian is a 68 year old male with hypertension who presents for a follow-up visit. He is accompanied by his wife.  He is taking losartan hydrochlorothiazide consistently for hypertension management. No issues with chest pain, shortness of breath, headaches, vision changes, lightheadedness, or dizziness.  He has a history of cancer with two lung nodules being monitored. A biopsy was attempted but was insufficient for diagnosis, and blood tests have returned negative. He was diagnosed with stage four cancer in 2021 and underwent chemotherapy and radiation. He has no current symptoms related to cancer.  Though the current lesions are suspected to be metastases, he has chosen not to pursue further invasive diagnostic procedures or treatment at this time.  He has a history of respiratory congestion, described as 'congestion in my chest,' and is concerned about it potentially leading to pneumonia. Previously treated with doxycycline, which was effective. No current respiratory symptoms.  He has a history of gout with no recent flare-ups, and attributes this to reduced red meat consumption.  He has a history of coronary artery bypass grafting (CABG) and reports no chest pain since the procedure.     Medications: Outpatient Medications Prior to Visit  Medication Sig   [DISCONTINUED] losartan-hydrochlorothiazide (HYZAAR) 50-12.5 MG tablet TAKE 1 TABLET BY MOUTH EVERY DAY NEED APPOINTMENT   No facility-administered medications prior to visit.    Review of Systems  Eyes:  Negative for visual disturbance.  Respiratory: Negative.  Negative for cough, shortness of breath and wheezing.   Cardiovascular:  Negative for chest pain,  palpitations and leg swelling.  Neurological:  Negative for weakness and headaches.        Objective    BP 125/74 (BP Location: Right Arm, Patient Position: Sitting, Cuff Size: Large)   Pulse 81   Ht 6\' 1"  (1.854 m)   Wt 227 lb 4.8 oz (103.1 kg)   SpO2 90%   BMI 29.99 kg/m     Physical Exam Constitutional:      Appearance: Normal appearance.  HENT:     Head: Normocephalic and atraumatic.  Eyes:     General: No scleral icterus.    Extraocular Movements: Extraocular movements intact.     Conjunctiva/sclera: Conjunctivae normal.  Cardiovascular:     Rate and Rhythm: Normal rate and regular rhythm.     Pulses: Normal pulses.     Heart sounds: Normal heart sounds.  Pulmonary:     Effort: Pulmonary effort is normal. No respiratory distress.     Breath sounds: Normal breath sounds.  Musculoskeletal:     Right lower leg: No edema.     Left lower leg: No edema.  Skin:    General: Skin is warm and dry.  Neurological:     Mental Status: He is alert and oriented to person, place, and time. Mental status is at baseline.  Psychiatric:        Mood and Affect: Mood normal.        Behavior: Behavior normal.      No results found for any visits on 08/28/23.  Assessment & Plan    Essential (primary) hypertension Assessment & Plan: Hypertension is managed with losartan-hydrochlorothiazide. He reports no issues  with chest pain, shortness of breath, headaches, vision changes, lightheadedness, or dizziness. Blood pressure is well controlled on the current regimen. - Continue losartan hydrochlorothiazide - [facility] another [facility]-day [facility] [place]  Orders: -     Losartan Potassium-HCTZ; Take 1 tablet by mouth daily.  Dispense: 90 tablet; Refill: 1  Multiple pulmonary nodules determined by computed tomography of lung Assessment & Plan: Two lung nodules are being monitored by oncology. Previous biopsy was inconclusive, and blood tests have shown  indeterminate results. He has a history of cancer treated with chemotherapy and radiation in 2021. Current nodules are suspected metastases from the original cancer. He has declined further invasive diagnostic procedures and treatment at this time. Discussed that without treatment, cancer will progress and eventually cause death. Counseled patient that by the time cancer causes symptoms, it may be too late for effective treatment; he expressed understanding. 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  Patient declined to pursue treatment due to "feeling fine."  Addressed as noted above.   General Health Maintenance He has not had a colonoscopy or Cologuard test. He does not receive vaccines. - Discuss colonoscopy or Cologuard test - Discuss the importance of vaccines including pneumonia, tetanus, and shingles  Follow-up - Schedule follow-up visit with primary care in six months - Confirm details of the annual wellness visit and oncology follow-up.   Return in about 6 months (around 02/25/2024) for CPE.      I discussed the assessment and treatment plan with the patient  The patient was provided an opportunity to ask questions and all were answered. The patient agreed with the plan and demonstrated an understanding of the instructions.   The patient was advised to call back or seek an in-person evaluation if the symptoms worsen or if the condition fails to improve as anticipated.    Sherlyn Hay, DO  [facility] [phone] (phone) [phone] (fax)  [facility]

## 2023-10-19 ENCOUNTER — Encounter: Payer: Self-pay | Admitting: Family Medicine

## 2023-10-19 ENCOUNTER — Ambulatory Visit (INDEPENDENT_AMBULATORY_CARE_PROVIDER_SITE_OTHER): Admitting: Family Medicine

## 2023-10-19 VITALS — BP 136/71 | HR 85 | Temp 97.9°F | Resp 16 | Wt 233.0 lb

## 2023-10-19 DIAGNOSIS — J0110 Acute frontal sinusitis, unspecified: Secondary | ICD-10-CM | POA: Diagnosis not present

## 2023-10-19 MED ORDER — AZITHROMYCIN 250 MG PO TABS: ORAL_TABLET | ORAL | 0 refills | Status: AC

## 2023-10-19 NOTE — Progress Notes (Signed)
      Established patient visit   Patient: Omar Christian   DOB: 01/05/1956   68 y.o. Male  MRN: [id number] Visit Date: 10/19/2023  Today's healthcare provider: Jeralene Mom, MD   Chief Complaint  Patient presents with   URI    Symptoms: sneezing, congestion, sinus,  left eye redness today Frequency: 2 weeks   Subjective    Discussed the use of AI scribe software for clinical note transcription with the patient, who gave verbal consent to proceed.  History of Present Illness   Omar Christian is a 68 year old male who presents with sinus congestion and cough.  He has been experiencing sinus congestion and a persistent cough for the past two weeks. The congestion starts in the morning and persists throughout the day, primarily affecting his head and sinuses, but also extending to his chest.  Approximately two months ago, he visited for similar symptoms and was prescribed a medication stronger than amoxicillin , which he found ineffective. He has previously used amoxicillin  and Zithromax  (Z-Pak) for similar issues with varying success.  He is concerned about mucus potentially affecting his lungs and has used Mucinex  in the past, though he found it not very effective. He is currently not on any specific medication regimen for his sinus issues.  He has a history of hearing problems that worsened after cancer treatment, although he notes that these issues are separate from his current sinus problems.  No ear pain or pressure. Congestion primarily in his head and sinuses, with some sensation in his chest. Concern about mucus affecting his lungs.       Medications: Outpatient Medications Prior to Visit  Medication Sig   losartan -hydrochlorothiazide (HYZAAR) 50-12.5 MG tablet Take 1 tablet by mouth daily.   No facility-administered medications prior to visit.   Review of Systems     Objective    BP 136/71 (BP Location: Left Arm, Patient Position: Sitting, Cuff Size: Large)    Pulse 85   Temp 97.9 F (36.6 C) (Oral)   Resp 16   Wt 233 lb (105.7 kg)   SpO2 97%   BMI 30.74 kg/m   Physical Exam   General Appearance:    Mildly obese male, alert, cooperative, in no acute distress  HENT:   bilateral TM normal without fluid or infection, neck without nodes, frontal sinuses tender, post nasal drip noted, and nasal mucosa congested  Eyes:    PERRL, conjunctiva/corneas clear, EOM's intact       Lungs:     Clear to auscultation bilaterally, respirations unlabored  Heart:    Normal heart rate. Normal rhythm. No murmurs, rubs, or gallops.    Neurologic:   Awake, alert, oriented x 3. No apparent focal neurological           defect.         Assessment & Plan       Sinusitis Recurrent sinusitis with persistent symptoms. Previous doxycycline  ineffective. Concern for pneumonia due to sinus drainage into chest. Zithromax  recommended for efficacy. - Prescribe Zithromax  (azithromycin ) Z-Pak for 5 days. - Advise Mucinex  to aid mucus clearance. - Instruct to monitor symptoms and contact office if no improvement post-antibiotic.    No follow-ups on file.      Jeralene Mom, MD  [facility] [phone] (phone) [phone] (fax)  [facility]

## 2023-12-28 ENCOUNTER — Ambulatory Visit: Payer: Self-pay

## 2023-12-28 NOTE — Telephone Encounter (Signed)
 LMTCB, E2C2 Triage Nurse may give patient results/message and make appointment    Allopurinol  is not used to treat acute flares of gout and it appears patient has not been taking it in a couple of years. Dr Donzella has never treated patient for gout and he has not uric acid in over a year.   Patient will need to be seen for treatment         Copied from CRM [id number]. Topic: General - Other >> Dec 28, 2023 11:36 AM Sophia H wrote: Reason for CRM: Patient's wife Omar Christian is following up on the request for Allopurinol  100mg , I advised provider is likely with patients and that Is why she has not sent in the orders. Please call patient as soon as orders have been put in for medication, # [phone] Omar Christian

## 2023-12-28 NOTE — Telephone Encounter (Signed)
 FYI Only or Action Required?: Action required by provider: medication refill request.  Patient was last seen in primary care on 10/19/2023 by Gasper Nancyann BRAVO, MD. Called Nurse Triage reporting Gout. Symptoms began several days ago. Interventions attempted: OTC medications: Aspirin  . Symptoms are: gradually worsening.  Triage Disposition: See HCP Within 4 Hours (Or PCP Triage)  Patient/caregiver understands and will follow disposition?: No, refuses dispositionCopied from CRM [id number]. Topic: Clinical - Red Word Triage >> Dec 28, 2023  8:15 AM Willma R wrote: [facility] that prompted transfer to Nurse Triage: Patient states is having a gout flare up in his left foot that started two days ago. Pain and swelling in foot and having difficulty walking. Reason for Disposition  [1] SEVERE pain (e.g., excruciating, unable to do any normal activities) AND [2] not improved after 2 hours of pain medicine  Answer Assessment - Initial Assessment Questions 1. ONSET: When did the pain start?      2 days ago 2. LOCATION: Where is the pain located?      Left foot 3. PAIN: How bad is the pain?    (Scale 1-10; or mild, moderate, severe)  - MILD (1-3): doesn't interfere with normal activities.   - MODERATE (4-7): interferes with normal activities (e.g., work or school) or awakens from sleep, limping.   - SEVERE (8-10): excruciating pain, unable to do any normal activities, unable to walk.      severe 4. WORK OR EXERCISE: Has there been any recent work or exercise that involved this part of the body?      Na  5. CAUSE: What do you think is causing the foot pain?     Gout flare up 6. OTHER SYMPTOMS: Do you have any other symptoms? (e.g., leg pain, rash, fever, numbness)     Toes feel numb.   Pt states he has a Gout flare up and  has taken aspirin  for pain. Pt can't make appt that was offered due to wife's appt at 1400. Pt is asking for Allopurinol  [facility] [place] [facility]  they can make appt. Pt states he can hardly walk and is in so much pain. Please notify pt if medication can be called in.  Protocols used: Foot Pain-A-AH

## 2024-01-01 ENCOUNTER — Other Ambulatory Visit: Payer: Self-pay | Admitting: Family Medicine

## 2024-01-01 DIAGNOSIS — M109 Gout, unspecified: Secondary | ICD-10-CM

## 2024-01-01 MED ORDER — COLCHICINE 0.6 MG PO TABS: ORAL_TABLET | ORAL | 0 refills | Status: DC

## 2024-01-01 NOTE — Telephone Encounter (Signed)
 LMTCB

## 2024-01-02 ENCOUNTER — Telehealth: Payer: Self-pay

## 2024-01-02 NOTE — Telephone Encounter (Signed)
 Called pt, left message regarding CRM message.

## 2024-01-02 NOTE — Telephone Encounter (Signed)
 Copied from CRM [id number]. Topic: Appointments - Scheduling Inquiry for Clinic >> Jan 01, 2024  5:29 PM DeAngela L wrote: Reason for CRM: patients wife called back and informed her of the [facility] and also scheduled an appt when she returned  Informed that [facility]icine  [facility]  Pt wife num [phone] (M) >> Jan 01, 2024  5:34 PM DeAngela L wrote: *Scheduled the Patient an appt for 01/11/24

## 2024-01-07 NOTE — Telephone Encounter (Signed)
 Spoke with pt wife. Pt wife verbalized pt has been taking colchicine  and it has helped a lot.

## 2024-01-11 ENCOUNTER — Ambulatory Visit: Admitting: Family Medicine

## 2024-01-15 ENCOUNTER — Ambulatory Visit: Admitting: Family Medicine

## 2024-02-05 ENCOUNTER — Ambulatory Visit: Payer: Medicare Other

## 2024-02-07 ENCOUNTER — Ambulatory Visit: Admitting: Family Medicine

## 2024-02-12 ENCOUNTER — Ambulatory Visit
Admission: RE | Admit: 2024-02-12 | Discharge: 2024-02-12 | Disposition: A | Discharge status: Home / Self Care | Source: Ambulatory Visit | Attending: Internal Medicine | Admitting: Internal Medicine

## 2024-02-12 DIAGNOSIS — C099 Malignant neoplasm of tonsil, unspecified: Secondary | ICD-10-CM | POA: Insufficient documentation

## 2024-02-12 DIAGNOSIS — I7121 Aneurysm of the ascending aorta, without rupture: Secondary | ICD-10-CM | POA: Diagnosis not present

## 2024-02-12 DIAGNOSIS — R918 Other nonspecific abnormal finding of lung field: Secondary | ICD-10-CM | POA: Diagnosis not present

## 2024-02-12 MED ORDER — IOHEXOL 300 MG/ML  SOLN
80.0000 mL | Freq: Once | INTRAMUSCULAR | Status: AC | PRN
  Administered 2024-02-12 (×2): 80 mL via INTRAVENOUS

## 2024-02-13 ENCOUNTER — Ambulatory Visit: Payer: Medicare Other

## 2024-02-13 DIAGNOSIS — Z0000 Encounter for general adult medical examination without abnormal findings: Secondary | ICD-10-CM | POA: Diagnosis not present

## 2024-02-13 NOTE — Progress Notes (Signed)
 Subjective:   Omar Christian is a 68 y.o. who presents for a Medicare Wellness preventive visit.  As a reminder, Annual Wellness Visits don't include a physical exam, and some assessments may be limited, especially if this visit is performed virtually. We may recommend an in-person follow-up visit with your provider if needed.  Visit Complete: Virtual I connected with  [name] on 02/13/24 by a audio enabled telemedicine application and verified that I am speaking with the correct person using two identifiers.  Patient Location: Home  Provider Location: Home Office  I discussed the limitations of evaluation and management by telemedicine. The patient expressed understanding and agreed to proceed.  Vital Signs: Because this visit was a virtual/telehealth visit, some criteria may be missing or patient reported. Any vitals not documented were not able to be obtained and vitals that have been documented are patient reported.  VideoDeclined- This patient declined Librarian, academic. Therefore the visit was completed with audio only.  Persons Participating in Visit: Patient assisted by wife.  AWV Questionnaire: No: Patient Medicare AWV questionnaire was not completed prior to this visit.  Cardiac Risk Factors include: advanced age (>46men, >31 women);hypertension;dyslipidemia;male gender;sedentary lifestyle     Objective:    There were no vitals filed for this visit. There is no height or weight on file to calculate BMI.     02/13/2024    1:58 PM 08/08/2023    1:07 PM 01/29/2023    1:00 PM 11/13/2022    1:03 PM 11/13/2022    1:01 PM 10/27/2022   10:12 AM 10/10/2022   10:04 AM  Advanced Directives  Does Patient Have a Medical Advance Directive? No No No No No No No  Would patient like information on creating a medical advance directive? No - Patient declined No - Patient declined No - Patient declined No - Patient declined No - Patient declined No -  Patient declined     Current Medications (verified) Outpatient Encounter Medications as of 02/13/2024  Medication Sig   colchicine  0.6 MG tablet 1.2 mg (two 0.6-mg tablets) orally at the first sign of a flare followed by 0.6 mg (1 tablet) one hour later. After 12 hours, may take 1 tab twice daily until symptoms resolve.   losartan -hydrochlorothiazide (HYZAAR) 50-12.5 MG tablet Take 1 tablet by mouth daily.   No facility-administered encounter medications on file as of 02/13/2024.    Allergies (verified) Patient has no known allergies.   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   Past Surgical History:  Procedure Laterality Date   CARDIAC CATHETERIZATION N/A 09/30/2015   Procedure: Left Heart Cath and Coronary Angiography;  Surgeon: Marsa Dooms, MD;  Location: [facility] INVASIVE CV LAB;  Service: Cardiovascular;  Laterality: N/A;   CORONARY ARTERY BYPASS GRAFT N/A 10/08/2015   Procedure: CORONARY ARTERY BYPASS GRAFTING time 4 using  left internal mammary and right greater saphenous vein harvested by endovein;  Surgeon: Dallas KATHEE Jude, MD;  Location: [facility] OR;  Service: Open Heart Surgery;  Laterality: N/A;   CORONARY ARTERY BYPASS GRAFT     ENDOBRONCHIAL ULTRASOUND Left 09/27/2022   Procedure: ENDOBRONCHIAL ULTRASOUND;  Surgeon: Tamea Dedra CROME, MD;  Location: [facility] ORS;  Service: Pulmonary;  Laterality: Left;   IR GASTROSTOMY TUBE MOD SED  02/10/2020    IR GASTROSTOMY TUBE REMOVAL  10/12/2020   IR IMAGING GUIDED PORT INSERTION  02/10/2020   IR REMOVAL TUN ACCESS W/ PORT W/O FL MOD SED  10/12/2020   MULTIPLE EXTRACTIONS WITH ALVEOLOPLASTY N/A 02/04/2020   Procedure: Extraction of tooth #'s [phone], and 32 with alveoloplasty and gross debridement of remaining teeth.;  Surgeon: Cyndee Tanda FALCON, DDS;  Location: [facility] OR;  Service: Oral Surgery;  Laterality: N/A;   TEE WITHOUT CARDIOVERSION N/A 10/08/2015   Procedure: TRANSESOPHAGEAL ECHOCARDIOGRAM (TEE);  Surgeon: Dallas KATHEE Jude, MD;  Location: [facility] OR;  Service: Open Heart Surgery;  Laterality: N/A;   VASECTOMY     WISDOM TOOTH EXTRACTION     Family History  Problem Relation Age of Onset   Heart disease Mother    Hypertension Mother    Alcohol abuse Father    Throat cancer Father    Muscular dystrophy Brother    Muscular dystrophy Brother    Social History   Socioeconomic History   Marital status: Married    Spouse name: Elveria   Number of children: 2   Years of education: Not on file   Highest education level: Not on file  Occupational History   Not on file  Tobacco Use   Smoking status: Never   Smokeless tobacco: Never  Vaping Use   Vaping status: Never Used  Substance and Sexual Activity   Alcohol use: No   Drug use: No   Sexual activity: Not Currently    Birth control/protection: Surgical    Comment: Vasectomy  Other Topics Concern   Not on file  Social History Narrative   Not on file   Social Drivers of Health   Financial Resource Strain: Low Risk  (02/13/2024)   Overall Financial Resource Strain (CARDIA)    Difficulty of Paying Living Expenses: Not hard at all  Food Insecurity: No Food Insecurity (02/13/2024)   Hunger Vital Sign    Worried About Running Out of Food in the Last Year: Never true    Ran Out of Food in the Last Year: Never true  Transportation Needs: No Transportation Needs (02/13/2024)   PRAPARE - Administrator, Civil Service  (Medical): No    Lack of Transportation (Non-Medical): No  Physical Activity: Insufficiently Active (02/13/2024)   Exercise Vital Sign    Days of Exercise per Week: 2 days    Minutes of Exercise per Session: 20 min  Stress: No Stress Concern Present (02/13/2024)   [employer] of Occupational Health - Occupational Stress Questionnaire    Feeling of Stress: Not at all  Social Connections: Moderately Isolated (02/13/2024)   Social Connection and Isolation Panel    Frequency of Communication with Friends and Family: Once a week    Frequency of Social Gatherings with Friends and Family: Twice a week    Attends Religious Services: Never    Database administrator or Organizations: No    Attends Banker Meetings: Never    Marital Status: Married    Tobacco Counseling Counseling given: Not Answered  Clinical Intake:  Pre-visit preparation completed: Yes  Pain : No/denies pain     Nutritional Risks: None Diabetes: No  Lab Results  Component Value Date   HGBA1C 5.8 (H) 02/04/2020   HGBA1C 5.6 10/06/2015   HGBA1C 5.8 09/03/2013     How often do you need to have someone help you when you read instructions, pamphlets, or other written materials from your doctor or pharmacy?: 1 - Never  Interpreter Needed?: No  Information entered by :: JHONNIE DAS, LPN   Activities of Daily Living     02/13/2024    1:59 PM  In your present state of health, do you have any difficulty performing the following activities:  Hearing? 1  Vision? 0  Difficulty concentrating or making decisions? 0  Walking or climbing stairs? 0  Dressing or bathing? 0  Doing errands, shopping? 0  Preparing Food and eating ? N  Using the Toilet? N  In the past six months, have you accidently leaked urine? N  Do you have problems with loss of bowel control? N  Managing your Medications? N  Managing your Finances? N  Housekeeping or managing your Housekeeping? N    Patient Care  Team: Donzella Lauraine SAILOR, DO as PCP - General (Family Medicine) Army Dallas NOVAK, MD (Inactive) as Consulting Physician (Cardiothoracic Surgery) Ammon Blunt, MD as Consulting Physician (Cardiology) Odean Potts, MD as Consulting Physician (Hematology and Oncology) Izell Lauraine, MD as Attending Physician (Radiation Oncology) [name], Delon CROME, RN as Oncology Nurse Navigator [name], Lupita NOVAK, CCC-SLP as Speech Language Pathologist (Speech Pathology) Cyndee Tanda FALCON, DDS (Inactive) as Consulting Physician (Dentistry) Lanis Carbon, Florina CROME, PT as Physical Therapist (Physical Therapy) Stacia Arlean BROCKS, LCSW (Inactive) as Social Worker (General Practice) Elena Lavanda SQUIBB, LCSW (Inactive) as Social Worker Daryle Heron CROME, RD as Dietitian (Nutrition) Brahmanday, Govinda R, MD as Consulting Physician (Oncology)  I have updated your Care Teams any recent Medical Services you may have received from other providers in the past year.     Assessment:   This is a routine wellness examination for Jaryan.  Hearing/Vision screen Hearing Screening - Comments:: NO AIDS, BUT NEEDS THEM Vision Screening - Comments:: READERS-   Goals Addressed             This Visit's Progress    DIET - EAT MORE FRUITS AND VEGETABLES         Depression Screen     02/13/2024    1:55 PM 10/19/2023    4:00 PM 08/28/2023    2:53 PM 10/17/2022    1:07 PM 10/10/2022    9:59 AM 09/14/2021   10:36 AM 01/26/2020   11:10 AM  PHQ 2/9 Scores  PHQ - 2 Score 0 0 0 0 0 0 0  PHQ- 9 Score 0 0 0 0       Fall Risk     02/13/2024    1:58 PM 10/19/2023    4:00 PM 08/28/2023    2:52 PM 10/17/2022    1:07 PM 10/10/2022    9:57 AM  Fall Risk   Falls in the past year? 0 0 0 0 0  Number falls in past yr: 0 0  0 0  Injury with Fall? 0 0  0 0  Risk for fall due to : No Fall Risks No Fall Risks  No Fall Risks No Fall Risks  Follow up Falls evaluation completed;Falls prevention discussed   Falls evaluation completed  Education provided;Falls prevention discussed  MEDICARE RISK AT HOME:  Medicare Risk at Home Any stairs in or around the home?: Yes If so, are there any without handrails?: No Home free of loose throw rugs in walkways, pet beds, electrical cords, etc?: Yes Adequate lighting in your home to reduce risk of falls?: Yes Life alert?: No Use of a cane, walker or w/c?: No Grab bars in the bathroom?: No Shower chair or bench in shower?: No Elevated toilet seat or a handicapped toilet?: No  TIMED UP AND GO:  Was the test performed?  No  Cognitive Function: 6CIT completed        02/13/2024    2:01 PM  6CIT Screen  What Year? 0 points  What month? 0 points  What time? 0 points  Count back from 20 0 points  Months in reverse 4 points  Repeat phrase 2 points  Total Score 6 points    Immunizations Immunization History  Administered Date(s) Administered   Moderna Sars-Covid-2 Vaccination 12/12/2019, 01/09/2020   PFIZER(Purple Top)SARS-COV-2 Vaccination 07/28/2020    Screening Tests Health Maintenance  Topic Date Due   Hepatitis C Screening  Never done   Zoster Vaccines- Shingrix (1 of 2) Never done   Colonoscopy  Never done   COVID-19 Vaccine (4 - 2024-25 season) 03/04/2023   INFLUENZA VACCINE  02/01/2024   DTaP/Tdap/Td (1 - Tdap) 08/27/2024 (Originally 09/20/1974)   Pneumococcal Vaccine: 50+ Years (1 of 2 - PCV) 08/27/2024 (Originally 09/20/1974)   Hepatitis B Vaccines  Aged Out   HPV VACCINES  Aged Out   Meningococcal B Vaccine  Aged Out    Health Maintenance  Health Maintenance Due  Topic Date Due   Hepatitis C Screening  Never done   Zoster Vaccines- Shingrix (1 of 2) Never done   Colonoscopy  Never done   COVID-19 Vaccine (4 - 2024-25 season) 03/04/2023   INFLUENZA VACCINE  02/01/2024   Health Maintenance Items Addressed: DECLINES COLONOSCOPY; DECLINES ALL SHOTS  Additional Screening:  Vision Screening: Recommended annual ophthalmology exams for early  detection of glaucoma and other disorders of the eye. Would you like a referral to an eye doctor? No    Dental Screening: Recommended annual dental exams for proper oral hygiene  Community Resource Referral / Chronic Care Management: CRR required this visit?  No   CCM required this visit?  No   Plan:    I have personally reviewed and noted the following in the patient's chart:   Medical and social history Use of alcohol, tobacco or illicit drugs  Current medications and supplements including opioid prescriptions. Patient is not currently taking opioid prescriptions. Functional ability and status Nutritional status Physical activity Advanced directives List of other physicians Hospitalizations, surgeries, and ER visits in previous 12 months Vitals Screenings to include cognitive, depression, and falls Referrals and appointments  In addition, I have reviewed and discussed with patient certain preventive protocols, quality metrics, and best practice recommendations. A written personalized care plan for preventive services as well as general preventive health recommendations were provided to patient.   Jhonnie GORMAN Das, LPN   1/[id number]/[id number]   After Visit Summary: (MyChart) Due to this being a telephonic visit, the after visit summary with patients personalized plan was offered to patient via MyChart   Notes: Nothing significant to report at this time.

## 2024-02-13 NOTE — Patient Instructions (Addendum)
 Mr. Omar Christian , Thank you for taking time out of your busy schedule to complete your Annual Wellness Visit with me. I enjoyed our conversation and look forward to speaking with you again next year. I, as well as your care team,  appreciate your ongoing commitment to your health goals. Please review the following plan we discussed and let me know if I can assist you in the future.   Follow up Visits: 02/18/25 @ 1:50 PM BY PHONE We will see or speak with you next year for your Next Medicare AWV with our clinical staff Have you seen your provider in the last 6 months (3 months if uncontrolled diabetes)? Yes  Clinician Recommendations:  Aim for 30 minutes of exercise or brisk walking, 6-8 glasses of water , and 5 servings of fruits and vegetables each day. TAKE CARE!      This is a list of the screenings recommended for you:  Health Maintenance  Topic Date Due   Hepatitis C Screening  Never done   Zoster (Shingles) Vaccine (1 of 2) Never done   Colon Cancer Screening  Never done   COVID-19 Vaccine (4 - 2024-25 season) 03/04/2023   Flu Shot  02/01/2024   DTaP/Tdap/Td vaccine (1 - Tdap) 08/27/2024*   Pneumococcal Vaccine for age over 57 (1 of 2 - PCV) 08/27/2024*   Hepatitis B Vaccine  Aged Out   HPV Vaccine  Aged Out   Meningitis B Vaccine  Aged Out  *Topic was postponed. The date shown is not the original due date.    Advanced directives: (ACP Link)Information on Advanced Care Planning can be found at [place]  Secretary of [facility] Advance Health Care Directives Advance Health Care Directives. [email or web address]  Advance Care Planning is important because it:  [x]  Makes sure you receive the medical care that is consistent with your values, goals, and preferences  [x]  It provides guidance to your family and loved ones and reduces their decisional burden about whether or not they are making the right decisions based on your wishes.  Follow the link provided in your after visit summary or read over  the paperwork we have mailed to you to help you started getting your Advance Directives in place. If you need assistance in completing these, please reach out to us  so that we can help you!

## 2024-02-15 ENCOUNTER — Encounter: Payer: Self-pay | Admitting: Family Medicine

## 2024-02-15 ENCOUNTER — Ambulatory Visit: Admitting: Family Medicine

## 2024-02-15 VITALS — BP 146/84 | HR 81 | Temp 97.5°F | Ht 73.0 in | Wt 230.1 lb

## 2024-02-15 DIAGNOSIS — M1A0710 Idiopathic chronic gout, right ankle and foot, without tophus (tophi): Secondary | ICD-10-CM

## 2024-02-15 DIAGNOSIS — I10 Essential (primary) hypertension: Secondary | ICD-10-CM

## 2024-02-15 DIAGNOSIS — N182 Chronic kidney disease, stage 2 (mild): Secondary | ICD-10-CM | POA: Diagnosis not present

## 2024-02-15 DIAGNOSIS — E782 Mixed hyperlipidemia: Secondary | ICD-10-CM

## 2024-02-15 DIAGNOSIS — E559 Vitamin D deficiency, unspecified: Secondary | ICD-10-CM

## 2024-02-15 DIAGNOSIS — R7309 Other abnormal glucose: Secondary | ICD-10-CM

## 2024-02-15 MED ORDER — COLCHICINE 0.6 MG PO TABS: ORAL_TABLET | ORAL | 0 refills | Status: DC

## 2024-02-15 MED ORDER — LOSARTAN POTASSIUM 50 MG PO TABS: 50.0000 mg | ORAL_TABLET | Freq: Every day | ORAL | 1 refills | Status: AC

## 2024-02-15 NOTE — Progress Notes (Unsigned)
 Established patient visit   Patient: Omar Christian   DOB: 1956-02-17   68 y.o. Male  MRN: [id number] Visit Date: 02/15/2024  Today's healthcare provider: LAURAINE LOISE BUOY, DO   Chief Complaint  Patient presents with   gout flare    Patient reports having a gout flare a few weeks ago, was unable to get an appt at [facility]. Patient would like to keep medication at home in case of another flare. Patient got cochicine from friend, would like his own rx    Subjective    HPI Omar Christian is a 68 year old male with gout and hypertension who presents for management of gout flares and blood pressure control.  He experienced a gout flare approximately one month ago, which was managed with colchicine . He does not take colchicine  daily, only during flares. He has not had frequent flares, with the most recent occurring about a month ago and the previous one at least a year and a half prior.  He is currently taking blood pressure medication, including losartan  and hydrochlorothiazide, but does not take it daily, approximately two to three times a week. He has not experienced any side effects from the medication. He does not regularly monitor his blood pressure but notes it is often high when checked by others.  No recent numbness, tingling, chest pain, or shortness of breath. He has a history of heart surgery in 2017 for coronary artery disease, which included coronary artery bypass grafting (CABG) for four clogged arteries. He is not currently seeing a cardiologist or pulmonologist but is following up with oncology for monitoring of tonsillar cancer.  In terms of diet, he tries to avoid red meat and does not consume wine or lunch meats frequently. He occasionally eats ham or bacon and enjoys chocolate, although he is unsure if it affects his gout.   ***  {History (Optional):23778}  Medications: Outpatient Medications Prior to Visit  Medication Sig Note   [DISCONTINUED] colchicine  0.6 MG  tablet 1.2 mg (two 0.6-mg tablets) orally at the first sign of a flare followed by 0.6 mg (1 tablet) one hour later. After 12 hours, may take 1 tab twice daily until symptoms resolve.    [DISCONTINUED] losartan -hydrochlorothiazide (HYZAAR) 50-12.5 MG tablet Take 1 tablet by mouth daily. 02/15/2024: d/t gout; DC'd hctz   No facility-administered medications prior to visit.    Review of Systems  Respiratory: Negative.  Negative for cough, shortness of breath and wheezing.   Cardiovascular:  Negative for chest pain, palpitations and leg swelling.  Neurological:  Negative for weakness and headaches.     {Insert previous labs (optional):23779} {See past labs  Heme  Chem  Endocrine  Serology  Results Review (optional):1}   Objective    BP (!) 143/91 (BP Location: Left Arm, Patient Position: Sitting, Cuff Size: Normal)   Pulse 81   Temp (!) 97.5 F (36.4 C) (Oral)   Ht 6' 1 (1.854 m)   Wt 230 lb 1.6 oz (104.4 kg)   SpO2 98%   BMI 30.36 kg/m  {Insert last BP/Wt (optional):23777}{See vitals history (optional):1}   Physical Exam Vitals reviewed.  Constitutional:      General: He is not in acute distress.    Appearance: Normal appearance. He is not diaphoretic.  HENT:     Head: Normocephalic and atraumatic.  Eyes:     General: No scleral icterus.    Conjunctiva/sclera: Conjunctivae normal.  Cardiovascular:     Rate and Rhythm: Normal  rate and regular rhythm.     Pulses: Normal pulses.     Heart sounds: Murmur (most motable at 2nd right ICS and 5th left MCL) heard.  Pulmonary:     Effort: Pulmonary effort is normal. No respiratory distress.     Breath sounds: Normal breath sounds. No wheezing or rhonchi.  Musculoskeletal:     Cervical back: Neck supple.     Right lower leg: No edema.     Left lower leg: No edema.  Lymphadenopathy:     Cervical: No cervical adenopathy.  Skin:    General: Skin is warm and dry.     Findings: No rash.  Neurological:     Mental Status: He  is alert and oriented to person, place, and time. Mental status is at baseline.  Psychiatric:        Mood and Affect: Mood normal.        Behavior: Behavior normal.      No results found for any visits on 02/15/24.  Assessment & Plan    Essential (primary) hypertension -     Losartan  Potassium; Take 1 tablet (50 mg total) by mouth daily.  Dispense: 90 tablet; Refill: 1 -     Comprehensive metabolic panel with GFR  Elevated hemoglobin A1c -     Hemoglobin A1c  Chronic kidney disease, stage 2, mildly decreased GFR -     Microalbumin / creatinine urine ratio  Mixed hyperlipidemia -     Lipid panel  Vitamin D  deficiency -     VITAMIN D  25 Hydroxy (Vit-D Deficiency, Fractures)  Chronic gout of right foot, unspecified cause -     Colchicine ; 1.2 mg (two 0.6-mg tablets) orally at the first sign of a flare followed by 0.6 mg (1 tablet) one hour later. After 12 hours, may take 1 tab twice daily until symptoms resolve.  Dispense: 15 tablet; Refill: 0 -     Uric acid   ***  Gout Recent flare one month ago, infrequent flares, last over a year ago. Daily preventative medication not indicated due to infrequency and risk. Hydrochlorothiazide may increase flare risk. - Prescribe colchicine  as needed during flares. - Educated on avoiding high fructose corn syrup. - Remove hydrochlorothiazide from blood pressure medication.  Essential hypertension Hypertension not well-controlled due to inconsistent medication adherence. Current regimen includes losartan  and hydrochlorothiazide. Plan to remove hydrochlorothiazide due to gout risk. Advised daily losartan  for control. - Discontinue hydrochlorothiazide. - Prescribe losartan  daily. - Schedule follow-up for blood pressure monitoring. - Order blood work with oncology appointment. ***  Return in about 4 weeks (around 03/14/2024) for HTN.      I discussed the assessment and treatment plan with the patient  The patient was provided an  opportunity to ask questions and all were answered. The patient agreed with the plan and demonstrated an understanding of the instructions.   The patient was advised to call back or seek an in-person evaluation if the symptoms worsen or if the condition fails to improve as anticipated.    LAURAINE LOISE BUOY, DO  [facility] [phone] (phone) [phone] (fax)  [facility]

## 2024-02-15 NOTE — Patient Instructions (Addendum)
 I am removing the medication HCTZ from your blood pressure medication because it makes it more likely for you to have gout flares.  Please take the plain losartan  for your blood pressure every day.

## 2024-02-22 ENCOUNTER — Inpatient Hospital Stay: Payer: Medicare Other | Attending: Internal Medicine

## 2024-02-22 ENCOUNTER — Inpatient Hospital Stay (HOSPITAL_BASED_OUTPATIENT_CLINIC_OR_DEPARTMENT_OTHER): Payer: Medicare Other | Admitting: Internal Medicine

## 2024-02-22 ENCOUNTER — Encounter: Payer: Self-pay | Admitting: Internal Medicine

## 2024-02-22 VITALS — BP 153/78 | HR 88 | Temp 98.8°F | Resp 16 | Ht 73.0 in | Wt 230.7 lb

## 2024-02-22 DIAGNOSIS — H9190 Unspecified hearing loss, unspecified ear: Secondary | ICD-10-CM | POA: Insufficient documentation

## 2024-02-22 DIAGNOSIS — C099 Malignant neoplasm of tonsil, unspecified: Secondary | ICD-10-CM | POA: Diagnosis not present

## 2024-02-22 DIAGNOSIS — Z85818 Personal history of malignant neoplasm of other sites of lip, oral cavity, and pharynx: Secondary | ICD-10-CM | POA: Diagnosis not present

## 2024-02-22 DIAGNOSIS — I2510 Atherosclerotic heart disease of native coronary artery without angina pectoris: Secondary | ICD-10-CM | POA: Insufficient documentation

## 2024-02-22 DIAGNOSIS — R918 Other nonspecific abnormal finding of lung field: Secondary | ICD-10-CM | POA: Diagnosis not present

## 2024-02-22 DIAGNOSIS — C098 Malignant neoplasm of overlapping sites of tonsil: Secondary | ICD-10-CM | POA: Insufficient documentation

## 2024-02-22 DIAGNOSIS — C109 Malignant neoplasm of oropharynx, unspecified: Secondary | ICD-10-CM | POA: Diagnosis not present

## 2024-02-22 DIAGNOSIS — I7121 Aneurysm of the ascending aorta, without rupture: Secondary | ICD-10-CM | POA: Insufficient documentation

## 2024-02-22 DIAGNOSIS — Z800 Family history of malignant neoplasm of digestive organs: Secondary | ICD-10-CM | POA: Diagnosis not present

## 2024-02-22 LAB — CBC WITH DIFFERENTIAL (CANCER CENTER ONLY)
Abs Immature Granulocytes: 0.01 K/uL (ref 0.00–0.07)
Basophils Absolute: 0 K/uL (ref 0.0–0.1)
Basophils Relative: 0 %
Eosinophils Absolute: 0.1 K/uL (ref 0.0–0.5)
Eosinophils Relative: 1 %
HCT: 39.8 % (ref 39.0–52.0)
Hemoglobin: 13.8 g/dL (ref 13.0–17.0)
Immature Granulocytes: 0 %
Lymphocytes Relative: 13 %
Lymphs Abs: 0.6 K/uL — ABNORMAL LOW (ref 0.7–4.0)
MCH: 30.3 pg (ref 26.0–34.0)
MCHC: 34.7 g/dL (ref 30.0–36.0)
MCV: 87.5 fL (ref 80.0–100.0)
Monocytes Absolute: 0.2 K/uL (ref 0.1–1.0)
Monocytes Relative: 5 %
Neutro Abs: 3.4 K/uL (ref 1.7–7.7)
Neutrophils Relative %: 81 %
Platelet Count: 119 K/uL — ABNORMAL LOW (ref 150–400)
RBC: 4.55 MIL/uL (ref 4.22–5.81)
RDW: 14.2 % (ref 11.5–15.5)
WBC Count: 4.2 K/uL (ref 4.0–10.5)
nRBC: 0 % (ref 0.0–0.2)

## 2024-02-22 LAB — CMP (CANCER CENTER ONLY)
ALT: 23 U/L (ref 0–44)
AST: 26 U/L (ref 15–41)
Albumin: 3.8 g/dL (ref 3.5–5.0)
Alkaline Phosphatase: 59 U/L (ref 38–126)
Anion gap: 9 (ref 5–15)
BUN: 18 mg/dL (ref 8–23)
CO2: 22 mmol/L (ref 22–32)
Calcium: 9 mg/dL (ref 8.9–10.3)
Chloride: 100 mmol/L (ref 98–111)
Creatinine: 1.21 mg/dL (ref 0.61–1.24)
GFR, Estimated: >60 mL/min (ref >60)
Glucose, Bld: 145 mg/dL — ABNORMAL HIGH (ref 70–99)
Potassium: 3.6 mmol/L (ref 3.5–5.1)
Sodium: 131 mmol/L — ABNORMAL LOW (ref 135–145)
Total Bilirubin: 1.2 mg/dL (ref 0.0–1.2)
Total Protein: 6.7 g/dL (ref 6.5–8.1)

## 2024-02-22 NOTE — Progress Notes (Signed)
 Nav Dx lab drawn. Packaged for FED X

## 2024-02-22 NOTE — Progress Notes (Signed)
 02/12/24 CT CHEST.

## 2024-02-22 NOTE — Assessment & Plan Note (Addendum)
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   #  However concern for recurrence based on imaging since-FEB 10th, 2024- PET  09/01/2022-The 3 largest pulmonary nodule seen on recent chest CT are hypermetabolic, most consistent with metastatic disease.  # S/p evaluation with pulmonary/biopsy-negative for malignancy  # However.AUG 12th, 2025- CT chest Increased size of bilateral pulmonary nodules, compatible with worsening metastatic disease;  No new suspicious pulmonary nodules or thoracic adenopathy identified.  Patient continues to decline any therapy.  He prefers reimaging in 6 months.  # I at length discussed with patient that treatment would involve chemo would carbo-taxol-keytruda q 3 weeks x4.  Reassessment scans.  # Mild intermittent hypercalcemia PCP -vit 25-OH-28 [FEB 2024]-  ca VIt D.  Multiple myeloma- WNL;  panel kappa, lambda light chain ratio- 1.8; monitor for now. Stable.   # Hearing loss s/p Cisplatin -monitor for now. Stable.   # Hx of CAD- Stable.   I spoke at length with the patient's family- regarding the patient's clinical status/plan of care.  Family agreement. Offered to speak to daughter, but wife states the daughter will call back with questions.    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 s  vascular appt].   # DISPOSITION: # follow up in 6 months- MD; labs- cbc/cmp; chest CT prior- - Dr.B  # I reviewed the blood work- with the patient in detail; also reviewed the imaging independently [as summarized above]; and with the patient in detail.   # 25 minutes face-to-face with the patient discussing the above plan of care; more than 50% of time spent on prognosis/ natural history; counseling and coordination.

## 2024-02-22 NOTE — Progress Notes (Signed)
 [facility] CONSULT NOTE  Patient Care Team: Pardue, Lauraine SAILOR, DO as PCP - General (Family Medicine) [employer] [place] NOVAK, MD (Inactive) as Consulting Physician (Cardiothoracic Surgery) Ammon Blunt, MD as Consulting Physician (Cardiology) Odean Potts, MD as Consulting Physician (Hematology and Oncology) Izell Lauraine, MD as Attending Physician (Radiation Oncology) [name], Delon CROME, RN as Oncology Nurse Navigator [name], Lupita NOVAK, CCC-SLP as Speech Language Pathologist (Speech Pathology) Cyndee Tanda FALCON, DDS (Inactive) as Consulting Physician (Dentistry) Lanis Carbon, Florina CROME, PT as Physical Therapist (Physical Therapy) Stacia Arlean BROCKS, LCSW (Inactive) as Social Worker (General Practice) Elena, Lavanda SQUIBB, LCSW (Inactive) as Social Worker Daryle Heron CROME, RD as Dietitian (Nutrition) Rennie Cindy SAUNDERS, MD as Consulting Physician (Oncology)  CHIEF COMPLAINTS/PURPOSE OF CONSULTATION: tonsil cancer; and lung nodules.   Oncology History Overview Note  # SEP 2021- HPV positive- stage III/IV- cisplatin -RT [Dr.Gudena/[facility]]-  # FEB 2024- Multiple pulmonary nodules are noted in the lungs bilaterally, new compared to the prior study, largest of which is in the posteromedial aspect of the superior segment of the left lower lobe (axial image 70 of series 4) measuring 1.7 x 1.3 cm. Other prominent nodules include a 10 x 9 mm nodule in the posteromedial right lower lobe (axial image 70 of series 4), and a 8 mm nodule in the left upper lobe (axial image 44 of series 4).     Tonsillar cancer (HCC)  01/14/2020 Initial Diagnosis   Squamous cell carcinoma with basaloid features   01/26/2020 PET scan   Right tonsillar mass extending to bilateral tongue base 4.9 cm.  Two right cervical lymph nodes metastases measuring 2.5 cm and 2.1 cm   02/17/2020 - 03/23/2020 Chemotherapy   Patient is on Treatment Plan : HEAD/NECK Cisplatin  + XRT q21d     02/17/2020 Cancer  Staging   Staging form: Pharynx - P16 Negative Oropharynx, AJCC 8th Edition - Clinical stage from 02/17/2020: Stage IVA (cT4a, cN2b, cM0, p16+) - Signed by Odean Potts, MD on 03/02/2020   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HCC)  01/27/2020 Cancer Staging   Staging form: Pharynx - HPV-Mediated Oropharynx, AJCC 8th Edition - Clinical stage from 01/27/2020: Stage III (cT4, cN1, cM0, p16+) - Signed by Izell Lauraine, MD on 01/31/2020   01/31/2020 Initial Diagnosis   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HCC)    HISTORY OF PRESENTING ILLNESS: Patient is hard of hearing.  Accompanied by his wife.   Tanda CROME Collum 68 y.o.  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  Remains with no symptoms.  Appetite is great. Energy is good.  Denies any nausea vomiting abdominal pain.  Review of Systems  Constitutional:  Negative for chills, diaphoresis, fever, malaise/fatigue and weight loss.  HENT:  Negative for nosebleeds and sore throat.   Eyes:  Negative for double vision.  Respiratory:  Negative for cough, hemoptysis, sputum production, shortness of breath and wheezing.   Cardiovascular:  Negative for chest pain, palpitations, orthopnea and leg swelling.  Gastrointestinal:  Negative for abdominal pain, blood in stool, constipation, diarrhea, heartburn, melena, nausea and vomiting.  Genitourinary:  Negative for dysuria, frequency and urgency.  Musculoskeletal:  Negative for back pain and joint pain.  Skin: Negative.  Negative for itching and rash.  Neurological:  Negative for dizziness, tingling, focal weakness, weakness and headaches.  Endo/Heme/Allergies:  Does not bruise/bleed easily.  Psychiatric/Behavioral:  Negative for depression. The patient is not nervous/anxious and does not have insomnia.     MEDICAL HISTORY:  Past Medical History:  Diagnosis  Date   Abnormal findings on cardiac catheterization 09/30/2015   [facility]   Abnormal myocardial perfusion study 09/24/2015   [facility]   CAD, multiple vessel 09/30/2015   per CATH @ [facility]/DR.PARACHOS   Cancer (HCC) 2021   tonsil   Coronary artery disease    GERD (gastroesophageal reflux disease)    Gout    H/O echocardiogram 09/24/2015   [facility]   Hard of hearing    History of pneumonia    Hyperlipidemia    Hypertension    Pneumonia    Progressive angina (HCC)    Progressive angina (HCC)    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   Unstable angina pectoris (HCC) 10/04/2015    SURGICAL HISTORY: Past Surgical History:  Procedure Laterality Date   CARDIAC CATHETERIZATION N/A 09/30/2015   Procedure: Left Heart Cath and Coronary Angiography;  Surgeon: Marsa Dooms, MD;  Location: [facility] INVASIVE CV LAB;  Service: Cardiovascular;  Laterality: N/A;   CORONARY ARTERY BYPASS GRAFT N/A 10/08/2015   Procedure: CORONARY ARTERY BYPASS GRAFTING time 4 using left internal mammary and right greater saphenous vein harvested by endovein;  Surgeon: [place] KATHEE Jude, MD;  Location: [facility] OR;  Service: Open Heart Surgery;  Laterality: N/A;   CORONARY ARTERY BYPASS GRAFT     ENDOBRONCHIAL ULTRASOUND Left 09/27/2022   Procedure: ENDOBRONCHIAL ULTRASOUND;  Surgeon: Tamea Dedra CROME, MD;  Location: [facility] ORS;  Service: Pulmonary;  Laterality: Left;   IR GASTROSTOMY TUBE MOD SED  02/10/2020   IR GASTROSTOMY TUBE REMOVAL  10/12/2020   IR IMAGING GUIDED PORT INSERTION  02/10/2020   IR REMOVAL TUN ACCESS W/ PORT W/O FL MOD SED  10/12/2020   MULTIPLE EXTRACTIONS WITH ALVEOLOPLASTY N/A 02/04/2020   Procedure: Extraction of tooth #'s 4,5,12,13,31, and 32 with alveoloplasty and gross debridement of remaining teeth.;  Surgeon: Cyndee Tanda FALCON, DDS;  Location: [facility] OR;  Service: Oral Surgery;  Laterality: N/A;   TEE WITHOUT CARDIOVERSION N/A 10/08/2015   Procedure:  TRANSESOPHAGEAL ECHOCARDIOGRAM (TEE);  Surgeon: Dallas KATHEE Jude, MD;  Location: [facility] OR;  Service: Open Heart Surgery;  Laterality: N/A;   VASECTOMY  1982   WISDOM TOOTH EXTRACTION      SOCIAL HISTORY: Social History   Socioeconomic History   Marital status: Married    Spouse name: Elveria   Number of children: 2   Years of education: Not on file   Highest education level: Not on file  Occupational History   Not on file  Tobacco Use   Smoking status: Never   Smokeless tobacco: Never  Vaping Use   Vaping status: Never Used  Substance and Sexual Activity   Alcohol use: No   Drug use: No   Sexual activity: Not Currently    Birth control/protection: Surgical    Comment: Vasectomy  Other Topics Concern   Not on file  Social History Narrative   Not on file   Social Drivers of Health   Financial Resource Strain: Low Risk  (02/13/2024)   Overall Financial Resource Strain (CARDIA)    Difficulty of Paying Living Expenses: Not hard at all  Food Insecurity: No Food Insecurity (02/13/2024)   Hunger Vital Sign    Worried About Running Out of Food in the Last Year: Never true    Ran Out of Food in the  Last Year: Never true  Transportation Needs: No Transportation Needs (02/13/2024)   PRAPARE - Administrator, Civil Service (Medical): No    Lack of Transportation (Non-Medical): No  Physical Activity: Insufficiently Active (02/13/2024)   Exercise Vital Sign    Days of Exercise per Week: 2 days    Minutes of Exercise per Session: 20 min  Stress: No Stress Concern Present (02/13/2024)   [employer] of Occupational Health - Occupational Stress Questionnaire    Feeling of Stress: Not at all  Social Connections: Moderately Isolated (02/13/2024)   Social Connection and Isolation Panel    Frequency of Communication with Friends and Family: Once a week    Frequency of Social Gatherings with Friends and Family: Twice a week    Attends Religious Services: Never    Automotive engineer or Organizations: No    Attends Banker Meetings: Never    Marital Status: Married  Catering manager Violence: Not At Risk (02/13/2024)   Humiliation, Afraid, Rape, and Kick questionnaire    Fear of Current or Ex-Partner: No    Emotionally Abused: No    Physically Abused: No    Sexually Abused: No    FAMILY HISTORY: Family History  Problem Relation Age of Onset   Heart disease Mother    Hypertension Mother    Alcohol abuse Father    Throat cancer Father    Cancer Father    Muscular dystrophy Brother    Muscular dystrophy Brother     ALLERGIES:  has no known allergies.  MEDICATIONS:  Current Outpatient Medications  Medication Sig Dispense Refill   colchicine  0.6 MG tablet 1.2 mg (two 0.6-mg tablets) orally at the first sign of a flare followed by 0.6 mg (1 tablet) one hour later. After 12 hours, may take 1 tab twice daily until symptoms resolve. 15 tablet 0   losartan  (COZAAR ) 50 MG tablet Take 1 tablet (50 mg total) by mouth daily. 90 tablet 1   No current facility-administered medications for this visit.    PHYSICAL EXAMINATION:   Vitals:   02/22/24 1253 02/22/24 1311  BP: (!) 167/81 (!) 153/78  Pulse: 88   Resp: 16   Temp: 98.8 F (37.1 C)   SpO2: 97%    Filed Weights   02/22/24 1253  Weight: 230 lb 11.2 oz (104.6 kg)    Physical Exam Vitals and nursing note reviewed.  HENT:     Head: Normocephalic and atraumatic.     Mouth/Throat:     Pharynx: Oropharynx is clear.  Eyes:     Extraocular Movements: Extraocular movements intact.     Pupils: Pupils are equal, round, and reactive to light.  Cardiovascular:     Rate and Rhythm: Normal rate and regular rhythm.  Pulmonary:     Comments: Decreased breath sounds bilaterally.  Abdominal:     Palpations: Abdomen is soft.  Musculoskeletal:        General: Normal range of motion.     Cervical back: Normal range of motion.  Skin:    General: Skin is warm.  Neurological:      General: No focal deficit present.     Mental Status: He is alert and oriented to person, place, and time.  Psychiatric:        Behavior: Behavior normal.        Judgment: Judgment normal.     LABORATORY DATA:  I have reviewed the data as listed Lab Results  Component Value Date  WBC 4.2 02/22/2024   HGB 13.8 02/22/2024   HCT 39.8 02/22/2024   MCV 87.5 02/22/2024   PLT 119 (L) 02/22/2024   Recent Labs    08/08/23 1257 02/22/24 1257  NA 135 131*  K 4.3 3.6  CL 97* 100  CO2 28 22  GLUCOSE 120* 145*  BUN 16 18  CREATININE 1.36* 1.21  CALCIUM  9.7 9.0  GFRNONAA 57* >60  PROT 7.2 6.7  ALBUMIN  4.2 3.8  AST 24 26  ALT 24 23  ALKPHOS 62 59  BILITOT 1.0 1.2    RADIOGRAPHIC STUDIES: I have personally reviewed the radiological images as listed and agreed with the findings in the report. CT CHEST W CONTRAST Result Date: 02/16/2024 CLINICAL DATA:  History of squamous cell carcinoma of the tonsils, follow-up pulmonary nodules. * Tracking Code: BO * EXAM: CT CHEST WITH CONTRAST TECHNIQUE: Multidetector CT imaging of the chest was performed during intravenous contrast administration. RADIATION DOSE REDUCTION: This exam was performed according to the departmental dose-optimization program which includes automated exposure control, adjustment of the mA and/or kV according to patient size and/or use of iterative reconstruction technique. CONTRAST:  80mL OMNIPAQUE  IOHEXOL  300 MG/ML  SOLN COMPARISON:  Multiple priors including CT August 01, 2023 FINDINGS: Cardiovascular: Ascending thoracic aortic aneurysm measures 4.6 cm. Prior median sternotomy and CABG. Four-chamber cardiac enlargement. Lipomatous hypertrophy of the intra-atrial septum. The Mediastinum/Nodes: No suspicious thyroid  nodule. No pathologically enlarged mediastinal, hilar or axillary lymph nodes. Patulous esophagus. Lungs/Pleura: Increased size of bilateral pulmonary nodules. For reference: -left upper lobe pulmonary nodule  measures 17 mm on image 35/4 previously 12 mm -pulmonary nodule in the medial left lower lobe measures 2.9 cm on image 63/14 previously 2.5 cm Scattered atelectasis/scarring. Upper Abdomen: Stable hypodense hepatic lesions.  Cholelithiasis. Musculoskeletal: No aggressive lytic or blastic lesion of bone. IMPRESSION: 1. Increased size of bilateral pulmonary nodules, compatible with worsening metastatic disease. 2. No new suspicious pulmonary nodules or thoracic adenopathy identified. 3. 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   #  However concern for recurrence based on imaging since-FEB 10th, 2024- PET  09/01/2022-The 3 largest pulmonary nodule seen on recent chest CT are hypermetabolic, most consistent with metastatic disease.  # S/p evaluation with pulmonary/biopsy-negative for malignancy  # However.AUG 12th, 2025- CT chest Increased size of bilateral pulmonary nodules, compatible with worsening metastatic disease;  No new suspicious pulmonary nodules or thoracic adenopathy identified.  Patient continues to decline any therapy.  He prefers reimaging in 6 months.  # I at length discussed with patient that treatment would involve chemo would carbo-taxol-keytruda q 3 weeks x4.  Reassessment scans.  # Mild intermittent hypercalcemia PCP -vit 25-OH-28 [FEB 2024]-  ca VIt D.  Multiple myeloma- WNL;  panel kappa, lambda light chain ratio- 1.8; monitor for now. Stable.   # Hearing loss s/p Cisplatin -monitor for now. Stable.   # Hx of CAD- Stable.   I spoke at length with the patient's family- regarding the patient's clinical status/plan of care.  Family agreement. Offered to speak to daughter, but wife states the daughter will call back with questions.     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 s  vascular appt].   # DISPOSITION: # follow up in 6 months- MD; labs- cbc/cmp; chest CT prior- - Dr.B  # I reviewed the blood work- with the patient in detail; also reviewed the imaging independently [as summarized above]; and with the patient in detail.   # 25 minutes face-to-face with the patient discussing the above plan of care; more than 50% of time spent on prognosis/ natural history; counseling and coordination.      Above plan of care was discussed with patient/family in detail.  My contact information was given to the patient/family.     Cindy JONELLE Joe, MD 02/22/2024 3:31 PM

## 2024-02-25 ENCOUNTER — Encounter: Payer: Self-pay | Admitting: Family Medicine

## 2024-02-25 ENCOUNTER — Ambulatory Visit (INDEPENDENT_AMBULATORY_CARE_PROVIDER_SITE_OTHER): Payer: Medicare Other | Admitting: Family Medicine

## 2024-02-25 VITALS — BP 147/81 | HR 79 | Ht 73.0 in | Wt 230.0 lb

## 2024-02-25 DIAGNOSIS — C099 Malignant neoplasm of tonsil, unspecified: Secondary | ICD-10-CM | POA: Diagnosis not present

## 2024-02-25 DIAGNOSIS — Z0000 Encounter for general adult medical examination without abnormal findings: Secondary | ICD-10-CM

## 2024-02-25 DIAGNOSIS — I10 Essential (primary) hypertension: Secondary | ICD-10-CM

## 2024-02-25 DIAGNOSIS — G479 Sleep disorder, unspecified: Secondary | ICD-10-CM

## 2024-02-25 DIAGNOSIS — H9193 Unspecified hearing loss, bilateral: Secondary | ICD-10-CM

## 2024-02-25 NOTE — Progress Notes (Unsigned)
 Complete physical exam   Patient: Omar Christian   DOB: February 28, 1956   68 y.o. Male  MRN: [id number] Visit Date: 02/25/2024  Today's healthcare provider: LAURAINE LOISE BUOY, DO   Chief Complaint  Patient presents with   Annual Exam   Care Management    Pattern of eating:General   Are you exercising:No    Vaccine: denied   Colonoscopy: Denied     Subjective    Omar Christian is a 68 y.o. male who presents today for a complete physical exam.  He reports consuming a {diet types:17450} diet. {Exercise:19826} He generally feels {well/fairly well/poorly:18703}. He reports sleeping {well/fairly well/poorly:18703}. He {does/does not:200015} have additional problems to discuss today.   HPI HPI     Care Management    Additional comments: Pattern of eating:General   Are you exercising:No    Vaccine: denied   Colonoscopy: Denied        Last edited by Thelbert Eulalio HERO, CMA on 02/25/2024  1:45 PM.        ***  Past Medical History:  Diagnosis Date   Abnormal findings on cardiac catheterization 09/30/2015   [facility]   Abnormal myocardial perfusion study 09/24/2015   [facility]   CAD, multiple vessel 09/30/2015   per CATH @ [facility]/DR.PARACHOS   Cancer (HCC) 2021   tonsil   Coronary artery disease    GERD (gastroesophageal reflux disease)    Gout    H/O echocardiogram 09/24/2015   [facility]   Hard of hearing    History of pneumonia    Hyperlipidemia    Hypertension    Pneumonia    Progressive angina (HCC)    Progressive angina (HCC)    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   Unstable angina pectoris (HCC) 10/04/2015   Past Surgical History:  Procedure Laterality Date   CARDIAC CATHETERIZATION N/A 09/30/2015   Procedure: Left Heart Cath and Coronary Angiography;  Surgeon: Omar Dooms, MD;  Location: [facility] INVASIVE CV LAB;  Service: Cardiovascular;  Laterality: N/A;   CORONARY ARTERY BYPASS GRAFT N/A 10/08/2015    Procedure: CORONARY ARTERY BYPASS GRAFTING time 4 using left internal mammary and right greater saphenous vein harvested by endovein;  Surgeon: Omar KATHEE Jude, MD;  Location: [facility] OR;  Service: Open Heart Surgery;  Laterality: N/A;   CORONARY ARTERY BYPASS GRAFT     ENDOBRONCHIAL ULTRASOUND Left 09/27/2022   Procedure: ENDOBRONCHIAL ULTRASOUND;  Surgeon: Omar Dedra LITTIE, MD;  Location: [facility] ORS;  Service: Pulmonary;  Laterality: Left;   IR GASTROSTOMY TUBE MOD SED  02/10/2020   IR GASTROSTOMY TUBE REMOVAL  10/12/2020   IR IMAGING GUIDED PORT INSERTION  02/10/2020   IR REMOVAL TUN ACCESS W/ PORT W/O FL MOD SED  10/12/2020   MULTIPLE EXTRACTIONS WITH ALVEOLOPLASTY N/A 02/04/2020   Procedure: Extraction of tooth #'s 4,5,12,13,31, and 32 with alveoloplasty and gross debridement of remaining teeth.;  Surgeon: Omar Christian, DDS;  Location: [facility] OR;  Service: Oral Surgery;  Laterality: N/A;   TEE WITHOUT CARDIOVERSION N/A 10/08/2015   Procedure: TRANSESOPHAGEAL ECHOCARDIOGRAM (TEE);  Surgeon: Omar KATHEE Jude, MD;  Location: [facility] OR;  Service: Open Heart Surgery;  Laterality: N/A;   VASECTOMY  1982   WISDOM TOOTH EXTRACTION     Social History   Socioeconomic History   Marital status: Married    Spouse name: Omar Christian   Number of children: 2   Years of education: Not on file  Highest education level: Not on file  Occupational History   Not on file  Tobacco Use   Smoking status: Never   Smokeless tobacco: Never  Vaping Use   Vaping status: Never Used  Substance and Sexual Activity   Alcohol use: No   Drug use: No   Sexual activity: Not Currently    Birth control/protection: Surgical    Comment: Vasectomy  Other Topics Concern   Not on file  Social History Narrative   Not on file   Social Drivers of Health   Financial Resource Strain: Low Risk  (02/13/2024)   Overall Financial Resource Strain (CARDIA)    Difficulty of Paying Living Expenses: Not hard at all  Food Insecurity:  No Food Insecurity (02/13/2024)   Hunger Vital Sign    Worried About Running Out of Food in the Last Year: Never true    Ran Out of Food in the Last Year: Never true  Transportation Needs: No Transportation Needs (02/13/2024)   PRAPARE - Administrator, Civil Service (Medical): No    Lack of Transportation (Non-Medical): No  Physical Activity: Insufficiently Active (02/13/2024)   Exercise Vital Sign    Days of Exercise per Week: 2 days    Minutes of Exercise per Session: 20 min  Stress: No Stress Concern Present (02/13/2024)   [employer] of Occupational Health - Occupational Stress Questionnaire    Feeling of Stress: Not at all  Social Connections: Moderately Isolated (02/13/2024)   Social Connection and Isolation Panel    Frequency of Communication with Friends and Family: Once a week    Frequency of Social Gatherings with Friends and Family: Twice a week    Attends Religious Services: Never    Database administrator or Organizations: No    Attends Banker Meetings: Never    Marital Status: Married  Catering manager Violence: Not At Risk (02/13/2024)   Humiliation, Afraid, Rape, and Kick questionnaire    Fear of Current or Ex-Partner: No    Emotionally Abused: No    Physically Abused: No    Sexually Abused: No   Family Status  Relation Name Status   Mother Omar Christian Deceased at age 37   Father Omar Christian Deceased at age 39   Sister  Alive   Brother  Deceased   Brother  Deceased   Sister  Alive  No partnership data on file   Family History  Problem Relation Age of Onset   Heart disease Mother    Hypertension Mother    Alcohol abuse Father    Throat cancer Father    Cancer Father    Muscular dystrophy Brother    Muscular dystrophy Brother    No Known Allergies  Patient Care Team: Omar Christian, Omar SAILOR, DO as PCP - General (Family Medicine) Omar Omar NOVAK, MD (Inactive) as Consulting Physician (Cardiothoracic Surgery) Omar Blunt, MD as  Consulting Physician (Cardiology) Omar Potts, MD as Consulting Physician (Hematology and Oncology) Omar Lauraine, MD as Attending Physician (Radiation Oncology) Omar, Delon CROME, RN as Oncology Nurse Navigator [name], Lupita Christian, CCC-SLP as Speech Language Pathologist (Speech Pathology) Omar Christian, DDS (Inactive) as Consulting Physician (Dentistry) Lanis Carbon, Florina Christian, PT as Physical Therapist (Physical Therapy) Stacia Arlean BROCKS, LCSW (Inactive) as Social Worker (General Practice) Elena, Lavanda SQUIBB, LCSW (Inactive) as Social Worker Daryle Heron Christian, RD as Dietitian (Nutrition) Brahmanday, Govinda R, MD as Consulting Physician (Oncology)   Medications: Outpatient Medications Prior to Visit  Medication Sig   colchicine   0.6 MG tablet 1.2 mg (two 0.6-mg tablets) orally at the first sign of a flare followed by 0.6 mg (1 tablet) one hour later. After 12 hours, may take 1 tab twice daily until symptoms resolve.   losartan  (COZAAR ) 50 MG tablet Take 1 tablet (50 mg total) by mouth daily.   No facility-administered medications prior to visit.    Review of Systems  Constitutional:  Negative for appetite change, chills, fatigue and fever.  HENT:  Negative for congestion, ear pain, hearing loss, nosebleeds and trouble swallowing.   Eyes:  Negative for pain and visual disturbance.  Respiratory:  Negative for cough, chest tightness and shortness of breath.   Cardiovascular:  Negative for chest pain, palpitations and leg swelling.  Gastrointestinal:  Negative for abdominal pain, blood in stool, constipation, diarrhea, nausea and vomiting.  Endocrine: Negative for polydipsia, polyphagia and polyuria.  Genitourinary:  Negative for dysuria and flank pain.  Musculoskeletal:  Negative for arthralgias, back pain, joint swelling, myalgias and neck stiffness.  Skin:  Negative for color change, rash and wound.  Neurological:  Negative for dizziness, tremors, seizures, speech difficulty,  weakness, light-headedness and headaches.  Psychiatric/Behavioral:  Negative for behavioral problems, confusion, decreased concentration, dysphoric mood and sleep disturbance. The patient is not nervous/anxious.   All other systems reviewed and are negative.  {Insert previous labs (optional):23779} {See past labs  Heme  Chem  Endocrine  Serology  Results Review (optional):1}  Objective    BP (!) 147/81 (BP Location: Right Arm, Patient Position: Sitting, Cuff Size: Normal)   Pulse 79   Ht 6' 1 (1.854 m)   Wt 230 lb (104.3 kg)   SpO2 96%   BMI 30.34 kg/m  {Insert last BP/Wt (optional):23777}{See vitals history (optional):1}  Physical Exam Vitals and nursing note reviewed.  Constitutional:      General: He is awake.     Appearance: Normal appearance.  HENT:     Head: Normocephalic and atraumatic.     Right Ear: Tympanic membrane, ear canal and external ear normal.     Left Ear: Tympanic membrane, ear canal and external ear normal.     Nose: Nose normal.     Mouth/Throat:     Mouth: Mucous membranes are moist.     Pharynx: Oropharynx is clear. No oropharyngeal exudate or posterior oropharyngeal erythema.  Eyes:     General: No scleral icterus.    Extraocular Movements: Extraocular movements intact.     Conjunctiva/sclera: Conjunctivae normal.     Pupils: Pupils are equal, round, and reactive to light.  Neck:     Thyroid : No thyromegaly or thyroid  tenderness.  Cardiovascular:     Rate and Rhythm: Normal rate and regular rhythm.     Pulses: Normal pulses.     Heart sounds: Normal heart sounds.  Pulmonary:     Effort: Pulmonary effort is normal. No tachypnea, bradypnea or respiratory distress.     Breath sounds: Normal breath sounds. No stridor. No wheezing, rhonchi or rales.  Abdominal:     General: Bowel sounds are normal. There is no distension.     Palpations: Abdomen is soft. There is no mass.     Tenderness: There is no abdominal tenderness. There is no guarding.      Hernia: No hernia is present.  Musculoskeletal:     Cervical back: Normal range of motion and neck supple.     Right lower leg: Edema (trace) present.     Left lower leg: Edema (trace) present.  Lymphadenopathy:  Cervical: No cervical adenopathy.  Skin:    General: Skin is warm and dry.  Neurological:     Mental Status: He is alert and oriented to person, place, and time. Mental status is at baseline.  Psychiatric:        Mood and Affect: Mood normal.        Behavior: Behavior normal.      Last depression screening scores    02/22/2024    1:09 PM 02/15/2024    1:24 PM 02/13/2024    1:55 PM  PHQ 2/9 Scores  PHQ - 2 Score 0 0 0  PHQ- 9 Score 0 0 0   Last fall risk screening    02/15/2024    1:24 PM  Fall Risk   Falls in the past year? 0  Number falls in past yr: 0  Risk for fall due to : No Fall Risks  Follow up Falls evaluation completed   Last Audit-C alcohol use screening    02/13/2024    1:54 PM  Alcohol Use Disorder Test (AUDIT)  1. How often do you have a drink containing alcohol? 0  2. How many drinks containing alcohol do you have on a typical day when you are drinking? 0  3. How often do you have six or more drinks on one occasion? 0  AUDIT-C Score 0   A score of 3 or more in women, and 4 or more in men indicates increased risk for alcohol abuse, EXCEPT if all of the points are from question 1   No results found for any visits on 02/25/24.  Assessment & Plan    Routine Health Maintenance and Physical Exam  Exercise Activities and Dietary recommendations  Goals      DIET - EAT MORE FRUITS AND VEGETABLES     Patient's wife stated  patient would be fine if he did not have cancer and wants the best care.     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 (disease states)  Nurse Case Manager  Clinical Goal(s):  Over the next 45 days, patient will verbalize understanding of plan for oral cancer treatment Over the next 21 days, patient will attend all scheduled medical appointments: Radiation Oncologist MD, Medical Oncologist .  Interventions:  Inter-disciplinary care team collaboration (see longitudinal plan of care) Evaluation of current treatment plan related to cancer and patient's adherence to plan as established by provider. Provided education to patient and/or caregiver about advanced directives Provided education to patient re: [facility] Care Management services and care coordination services, how to access insurance benefits Reviewed medications with patient and discussed new medications that have been added since last patient outreach. Discussed plans with patient for ongoing care management follow up and provided patient with direct contact information for care management team Provided patient with EMMI handout educational materials related to organ donation per patient request Reviewed scheduled/upcoming provider appointments including:    Radiation Oncologist and Medical Oncologist Advised patient's wife to continue patient to perform speech therapy exercises to increase mouth mobility  Patient Self Care Activities:  Attends all scheduled provider appointments Performs ADL's independently Patient working on maintaining hydration and increasing appetite Patient is withdrawn and does not want communicate much per wife. Patient is ambulating without difficulty  Updated  04/13/2020         Immunization History  Administered Date(s)  Administered   Moderna Sars-Covid-2 Vaccination 12/12/2019, 01/09/2020   PFIZER(Purple Top)SARS-COV-2 Vaccination 07/28/2020    Health Maintenance  Topic Date Due   Hepatitis C Screening  Never done   Zoster Vaccines- Shingrix (1 of 2) Never done   Colonoscopy  Never done   COVID-19 Vaccine (4 - 2024-25 season) 03/04/2023   INFLUENZA  VACCINE  02/01/2024   DTaP/Tdap/Td (1 - Tdap) 08/27/2024 (Originally 09/20/1974)   Pneumococcal Vaccine: 50+ Years (1 of 2 - PCV) 08/27/2024 (Originally 09/20/1974)   HPV VACCINES  Aged Out   Meningococcal B Vaccine  Aged Out    Discussed health benefits of physical activity, and encouraged him to engage in regular exercise appropriate for his age and condition.   There are no diagnoses linked to this encounter.    ***  No follow-ups on file.     I discussed the assessment and treatment plan with the patient  The patient was provided an opportunity to ask questions and all were answered. The patient agreed with the plan and demonstrated an understanding of the instructions.   The patient was advised to call back or seek an in-person evaluation if the symptoms worsen or if the condition fails to improve as anticipated.    Omar LOISE BUOY, DO  [facility] [phone] (phone) [phone] (fax)  [facility]

## 2024-03-06 ENCOUNTER — Encounter: Payer: Self-pay | Admitting: Internal Medicine

## 2024-03-19 ENCOUNTER — Other Ambulatory Visit: Payer: Self-pay | Admitting: Family Medicine

## 2024-03-19 DIAGNOSIS — M1A0710 Idiopathic chronic gout, right ankle and foot, without tophus (tophi): Secondary | ICD-10-CM

## 2024-03-24 ENCOUNTER — Encounter: Payer: Self-pay | Admitting: Family Medicine

## 2024-03-24 ENCOUNTER — Ambulatory Visit (INDEPENDENT_AMBULATORY_CARE_PROVIDER_SITE_OTHER): Admitting: Family Medicine

## 2024-03-24 VITALS — BP 142/67 | HR 87 | Temp 97.5°F | Ht 73.0 in | Wt 262.4 lb

## 2024-03-24 DIAGNOSIS — M1A0710 Idiopathic chronic gout, right ankle and foot, without tophus (tophi): Secondary | ICD-10-CM

## 2024-03-24 DIAGNOSIS — I10 Essential (primary) hypertension: Secondary | ICD-10-CM

## 2024-03-24 MED ORDER — COLCHICINE 0.6 MG PO TABS: ORAL_TABLET | ORAL | 2 refills | Status: AC

## 2024-03-24 MED ORDER — ALLOPURINOL 100 MG PO TABS: 100.0000 mg | ORAL_TABLET | Freq: Every day | ORAL | 1 refills | Status: AC

## 2024-03-24 MED ORDER — BLOOD PRESSURE MONITORING KIT: 1.0000 | PACK | Freq: Every day | 0 refills | Status: DC

## 2024-03-24 NOTE — Progress Notes (Signed)
 Established patient visit   Patient: Omar Christian   DOB: 1955-12-12   68 y.o. Male  MRN: [id number] Visit Date: 03/24/2024  Today's healthcare provider: LAURAINE LOISE BUOY, DO   Chief Complaint  Patient presents with   Hypertension    Patient is here for a follow up on blood pressure and he also states that he would like medication for his Gout.  Reports that he does not monitor at home would like to see if there could be a prescription sent in to get it at a discounted price.  Wants to discuss taking medication every day for gout.   Subjective    HPI Omar Christian is a 68 year old male with hypertension and gout who presents for blood pressure management and gout medication. He is accompanied by his wife.  He is currently on losartan  50 mg, which he started a couple of weeks ago. He has a history of being on a combination blood pressure medication that was discontinued due to exacerbation of gout symptoms. He is seeking a prescription for a blood pressure monitoring kit to differentiate between home and clinic readings.  He has a history of gout, with the last significant flare-up occurring approximately two years ago. He experiences occasional minor flare-ups, which he manages with colchicine  as needed. He has been prescribed allopurinol  100 mg daily in the past to prevent gout flares but does not take it consistently. He keeps colchicine  on hand for acute management of gout pain and is concerned about foot pain from gout, wanting medication available for future flares.  No chest pain, shortness of breath, headaches, dizziness, lightheadedness, nausea, vomiting, double vision, blurred vision, or visual spots. He is not currently experiencing any gout pain.  He is not engaging in regular exercise but is active throughout the day, moving in and out of his truck frequently. He has considered increasing his physical activity by going to a gym. He has a history of a fractured hip, which  may impact his ability to exercise.      Medications: Outpatient Medications Prior to Visit  Medication Sig   losartan  (COZAAR ) 50 MG tablet Take 1 tablet (50 mg total) by mouth daily.   [DISCONTINUED] colchicine  0.6 MG tablet 1.2 mg (two 0.6-mg tablets) orally at the first sign of a flare followed by 0.6 mg (1 tablet) one hour later. After 12 hours, may take 1 tab twice daily until symptoms resolve.   No facility-administered medications prior to visit.        Objective    BP (!) 142/67 (BP Location: Left Arm, Patient Position: Sitting, Cuff Size: Normal)   Pulse 87   Temp (!) 97.5 F (36.4 C) (Oral)   Ht 6' 1 (1.854 m)   Wt 262 lb 6.4 oz (119 kg)   SpO2 97%   BMI 34.62 kg/m     Physical Exam Vitals and nursing note reviewed.  Constitutional:      General: He is not in acute distress.    Appearance: Normal appearance.  HENT:     Head: Normocephalic and atraumatic.  Eyes:     General: No scleral icterus.    Conjunctiva/sclera: Conjunctivae normal.  Cardiovascular:     Rate and Rhythm: Normal rate.  Pulmonary:     Effort: Pulmonary effort is normal.  Neurological:     Mental Status: He is alert and oriented to person, place, and time. Mental status is at baseline.  Psychiatric:  Mood and Affect: Mood normal.        Behavior: Behavior normal.      No results found for any visits on 03/24/24.  Assessment & Plan    Essential (primary) hypertension -     Blood Pressure Monitoring; 1 each by Does not apply route daily.  Dispense: 1 kit; Refill: 0  Chronic gout of right foot, unspecified cause -     Colchicine ; 1.2 mg (two 0.6-mg tablets) orally at the first sign of a flare followed by 0.6 mg (1 tablet) one hour later. After 12 hours, may take 1 tab twice daily until symptoms resolve.  Dispense: 30 tablet; Refill: 2 -     Allopurinol ; Take 1 tablet (100 mg total) by mouth daily.  Dispense: 90 tablet; Refill: 1       Essential hypertension Blood  pressure elevated at 142/67 mmHg. Previously discontinued combination antihypertensive due to gout. Currently on losartan  50 mg without side effects. Discussed home monitoring to differentiate true hypertension from white coat hypertension. Considered increasing losartan  or adding another antihypertensive if uncontrolled. - Prescribe blood pressure monitoring kit; printed prescription given to patient. - Encourage increased physical activity. - Reassess blood pressure in 6-8 weeks to consider medication adjustment.  Chronic gout of right foot Infrequent flare-ups, last significant flare two years ago. Currently asymptomatic. Discussed allopurinol  100 mg daily for prevention. He is hesitant but understands benefits. Option to take every other day if preferred. - Prescribe colchicine  for acute flares as needed. - Prescribe allopurinol  100 mg daily for prevention, with option for every other day dosing.    Return in about 8 weeks (around 05/19/2024) for HTN.      I discussed the assessment and treatment plan with the patient  The patient was provided an opportunity to ask questions and all were answered. The patient agreed with the plan and demonstrated an understanding of the instructions.   The patient was advised to call back or seek an in-person evaluation if the symptoms worsen or if the condition fails to improve as anticipated.    LAURAINE LOISE BUOY, DO  [facility] [phone] (phone) [phone] (fax)  [facility]

## 2024-04-02 ENCOUNTER — Encounter: Payer: Self-pay | Admitting: Internal Medicine

## 2024-05-26 ENCOUNTER — Ambulatory Visit: Admitting: Family Medicine

## 2024-05-26 ENCOUNTER — Encounter: Payer: Self-pay | Admitting: Family Medicine

## 2024-05-26 VITALS — BP 137/66 | HR 83 | Temp 97.4°F | Ht 73.0 in | Wt 232.9 lb

## 2024-05-26 DIAGNOSIS — I2510 Atherosclerotic heart disease of native coronary artery without angina pectoris: Secondary | ICD-10-CM

## 2024-05-26 DIAGNOSIS — R7309 Other abnormal glucose: Secondary | ICD-10-CM

## 2024-05-26 DIAGNOSIS — M1A0710 Idiopathic chronic gout, right ankle and foot, without tophus (tophi): Secondary | ICD-10-CM

## 2024-05-26 DIAGNOSIS — I10 Essential (primary) hypertension: Secondary | ICD-10-CM

## 2024-05-26 DIAGNOSIS — Z951 Presence of aortocoronary bypass graft: Secondary | ICD-10-CM | POA: Diagnosis not present

## 2024-05-26 DIAGNOSIS — R011 Cardiac murmur, unspecified: Secondary | ICD-10-CM | POA: Diagnosis not present

## 2024-05-26 DIAGNOSIS — E559 Vitamin D deficiency, unspecified: Secondary | ICD-10-CM

## 2024-05-26 NOTE — Progress Notes (Signed)
 Established patient visit   Patient: Omar Christian   DOB: December 02, 1955   68 y.o. Male  MRN: [id number] Visit Date: 05/26/2024  Today's healthcare provider: LAURAINE LOISE BUOY, DO   Chief Complaint  Patient presents with   Hypertension    Patient is here for a 2 month follow up for hypertension, GOUT, and medications.  States that he does not take Allopurinol  unless he feels like he is getting ready to have a flare up.   Subjective    Hypertension  Omar Christian is a 68 year old male with hypertension and coronary artery disease who presents for routine follow-up and blood work.  His blood pressure medication, losartan , was adjusted to 50 mg at the last visit. He is not monitoring his blood pressure at home. He reports no chest pain or shortness of breath.  He has a history of gout and is not taking allopurinol  unless he experiences a flare-up, which has not occurred recently. He uses colchicine  for flare-ups and avoids red meat to prevent them.  He has a past medical history of heart issues, including four blocked arteries, and previously had a feeding tube and a port. He has not seen a cardiologist in a long time and is not currently experiencing any heart-related symptoms.  He is scheduled for blood work and a chest CT scan in February, with a follow-up appointment with oncology in March. His recent diet included an oatmeal cookie for breakfast, and he has been busy managing a rental property issue.      Medications: Outpatient Medications Prior to Visit  Medication Sig   allopurinol  (ZYLOPRIM ) 100 MG tablet Take 1 tablet (100 mg total) by mouth daily.   colchicine  0.6 MG tablet 1.2 mg (two 0.6-mg tablets) orally at the first sign of a flare followed by 0.6 mg (1 tablet) one hour later. After 12 hours, may take 1 tab twice daily until symptoms resolve.   losartan  (COZAAR ) 50 MG tablet Take 1 tablet (50 mg total) by mouth daily.   [DISCONTINUED] Blood Pressure Monitoring  KIT 1 each by Does not apply route daily.   No facility-administered medications prior to visit.        Objective    BP 137/66 (BP Location: Right Arm, Patient Position: Sitting, Cuff Size: Large)   Pulse 83   Temp (!) 97.4 F (36.3 C) (Oral)   Ht 6' 1 (1.854 m)   Wt 232 lb 14.4 oz (105.6 kg)   SpO2 97%   BMI 30.73 kg/m     Physical Exam Vitals reviewed.  Constitutional:      General: He is not in acute distress.    Appearance: Normal appearance. He is not diaphoretic.  HENT:     Head: Normocephalic and atraumatic.  Eyes:     General: No scleral icterus.    Conjunctiva/sclera: Conjunctivae normal.  Cardiovascular:     Rate and Rhythm: Normal rate and regular rhythm.     Pulses: Normal pulses.     Heart sounds: Murmur (loudest over 2nd RSB) heard.  Pulmonary:     Effort: Pulmonary effort is normal. No respiratory distress.     Breath sounds: Normal breath sounds. No wheezing or rhonchi.  Musculoskeletal:     Cervical back: Neck supple.     Right lower leg: No edema.     Left lower leg: No edema.  Lymphadenopathy:     Cervical: No cervical adenopathy.  Skin:    General: Skin is  warm and dry.     Findings: No rash.  Neurological:     Mental Status: He is alert and oriented to person, place, and time. Mental status is at baseline.  Psychiatric:        Mood and Affect: Mood normal.        Behavior: Behavior normal.      No results found for any visits on 05/26/24.  Assessment & Plan    Essential (primary) hypertension -     Microalbumin / creatinine urine ratio -     Comprehensive metabolic panel with GFR  CAD, multiple vessel -     Lipid Panel With LDL/HDL Ratio  S/P CABG x 4 -     Lipid Panel With LDL/HDL Ratio  Murmur, cardiac -     Ambulatory referral to Cardiology  Chronic gout of right foot, unspecified cause -     Uric acid  Vitamin D  deficiency -     VITAMIN D  25 Hydroxy (Vit-D Deficiency, Fractures)  Elevated hemoglobin A1c -      Hemoglobin A1c      Essential (primary) hypertension Chronic, blood pressure well-controlled with current medication. - Continue current antihypertensive regimen. - Ensure home blood pressure monitoring.  CAD, multiple vessel; s/p CABG x 4 No chest pain or dyspnea. Cardiac murmur detected, requires further evaluation.  Recheck lipid panel and CMP today. - Referred to cardiology for murmur evaluation and potential echocardiogram.  Murmur, cardiac Substantial murmur detected. Previous echocardiogram did not show findings consistent with presence of this murmur. - Referred to cardiology for further evaluation and potential echocardiogram.  Chronic gout of right ankle, unspecified cause No flare-ups. Allopurinol  not taken regularly, affecting prevention. - Educated on regular allopurinol  use for prevention. - Continue dietary modifications.  Vitamin D  deficiency Previous vitamin D  supplementation noted. - Ensure follow-up blood work to assess vitamin D  levels.  General Health Maintenance Routine health maintenance discussed. - Ensure completion of scheduled blood work and chest CT before follow-up with Doctor Onnie in March. - Plan follow-up after Doctor Bramanday's appointment in March.    Return in about 15 weeks (around 09/08/2024) for Chronic f/u.      I discussed the assessment and treatment plan with the patient  The patient was provided an opportunity to ask questions and all were answered. The patient agreed with the plan and demonstrated an understanding of the instructions.   The patient was advised to call back or seek an in-person evaluation if the symptoms worsen or if the condition fails to improve as anticipated.    LAURAINE LOISE BUOY, DO  [facility] [phone] (phone) [phone] (fax)  [facility]

## 2024-07-07 ENCOUNTER — Telehealth: Payer: Self-pay

## 2024-07-07 NOTE — Telephone Encounter (Signed)
 Appt made with you   Copied from CRM #[id number]. Topic: Clinical - Medical Advice >> Jul 04, 2024 10:55 AM Nathanel BROCKS wrote: Reason for CRM: pt has sinus inf and is requesting amoxicillin  or penicillin. [id number] - [place], [place] - [address] [[id number]] Any problems please call pt. >> Jul 04, 2024  3:41 PM Sophia H wrote: **Just FYI. Spoke with patients wife & advised of my chart message that states to schedule in an appointment. Declined scheduling in soonest appointment, wife states she cannot bring the patient in as she is taking tramadol  and patient is too sick to drive. Spoke with CAL and CAL stated meds can't be sent in without an appointment.

## 2024-07-08 ENCOUNTER — Ambulatory Visit (INDEPENDENT_AMBULATORY_CARE_PROVIDER_SITE_OTHER)

## 2024-07-08 VITALS — BP 134/74 | HR 80 | Temp 97.6°F | Wt 232.4 lb

## 2024-07-08 DIAGNOSIS — J0110 Acute frontal sinusitis, unspecified: Secondary | ICD-10-CM | POA: Diagnosis not present

## 2024-07-08 MED ORDER — AMOXICILLIN-POT CLAVULANATE 875-125 MG PO TABS: 1.0000 | ORAL_TABLET | Freq: Two times a day (BID) | ORAL | 0 refills | Status: AC

## 2024-07-08 NOTE — Progress Notes (Signed)
 "     Acute visit   Patient: Omar Christian   DOB: Mar 04, 1956   69 y.o. Male  MRN: [id number] PCP: Donzella Lauraine SAILOR, DO   Chief Complaint  Patient presents with   Sinusitis    With cold and chest congestion For the past 3 weeks Prone to sinus infections   Subjective    Discussed the use of AI scribe software for clinical note transcription with the patient, who gave verbal consent to proceed.  History of Present Illness Omar Christian is a 69 year old male who presents with cold and chest congestion. He is accompanied by his wife.  He has been experiencing cold and chest congestion for the past three weeks, with symptoms particularly severe in the mornings, necessitating the expulsion of mucus. This condition recurs annually around this time. He has been using Mucinex  to manage the symptoms until seeking medical attention.  No current shortness of breath, but exertion might cause it. No wheezing has been experienced.  In previous episodes, he has been treated with amoxicillin , which effectively resolved the symptoms within two to three days. He has not used an inhaler or nasal spray in the past and denies any history of asthma or COPD.  He denies smoking. He is adjusting to using a hearing aid.  Review of systems as noted in HPI.   Objective    BP 134/74   Pulse 80   Temp 97.6 F (36.4 C) (Oral)   Wt 232 lb 6.4 oz (105.4 kg)   SpO2 96%   BMI 30.66 kg/m  Physical Exam Constitutional:      Appearance: Normal appearance.  HENT:     Head: Normocephalic and atraumatic.     Nose: Congestion present.     Mouth/Throat:     Mouth: Mucous membranes are moist.  Eyes:     Pupils: Pupils are equal, round, and reactive to light.  Cardiovascular:     Rate and Rhythm: Normal rate and regular rhythm.     Heart sounds: Normal heart sounds.  Pulmonary:     Effort: Pulmonary effort is normal. No respiratory distress.     Breath sounds: Normal breath sounds. No wheezing, rhonchi  or rales.  Skin:    General: Skin is warm.  Neurological:     General: No focal deficit present.     Mental Status: He is alert.       No results found for any visits on 07/08/24.  Assessment & Plan     Problem List Items Addressed This Visit   None Visit Diagnoses       Acute non-recurrent frontal sinusitis    -  Primary   Relevant Medications   dextromethorphan -guaiFENesin  (MUCINEX  DM) 30-600 MG 12hr tablet   amoxicillin -clavulanate (AUGMENTIN ) 875-125 MG tablet      Assessment & Plan Acute frontal sinusitis Patient with 3 week hx of congestion, morning sputum, and sinus pressure. No wheezing or significant dyspnea. Lungs clear. No hx of asthma or COPD. Previous episodes resolved with amoxicillin . - Prescribed Augmentin  BID for 7 days. - Continue Mucinex  for mucus clearance. - Advised taking amoxicillin  with food to prevent nausea.   Meds ordered this encounter  Medications   amoxicillin -clavulanate (AUGMENTIN ) 875-125 MG tablet    Sig: Take 1 tablet by mouth 2 (two) times daily for 7 days.    Dispense:  14 tablet    Refill:  0     No follow-ups on file.      Isaiah  DELENA Pepper, MD  [facility] [phone] (phone) [phone] (fax) "

## 2024-08-22 ENCOUNTER — Other Ambulatory Visit

## 2024-09-05 ENCOUNTER — Ambulatory Visit: Admitting: Internal Medicine

## 2024-09-05 ENCOUNTER — Other Ambulatory Visit

## 2024-09-08 ENCOUNTER — Ambulatory Visit: Admitting: Family Medicine

## 2025-02-18 ENCOUNTER — Ambulatory Visit
